# Patient Record
Sex: Female | Born: 1990 | Race: Black or African American | Hispanic: No | Marital: Married | Smoking: Former smoker
Health system: Southern US, Community
[De-identification: ages and names within clinical notes are randomized; demographics above are authoritative.]

## PROBLEM LIST (undated history)

## (undated) ENCOUNTER — Inpatient Hospital Stay (HOSPITAL_COMMUNITY): Payer: Self-pay

## (undated) ENCOUNTER — Ambulatory Visit: Admission: EM | Payer: 59

## (undated) DIAGNOSIS — F419 Anxiety disorder, unspecified: Secondary | ICD-10-CM

## (undated) DIAGNOSIS — D649 Anemia, unspecified: Secondary | ICD-10-CM

## (undated) DIAGNOSIS — M109 Gout, unspecified: Secondary | ICD-10-CM

## (undated) DIAGNOSIS — Q513 Bicornate uterus: Secondary | ICD-10-CM

## (undated) DIAGNOSIS — B009 Herpesviral infection, unspecified: Secondary | ICD-10-CM

## (undated) DIAGNOSIS — I10 Essential (primary) hypertension: Secondary | ICD-10-CM

## (undated) DIAGNOSIS — Z8759 Personal history of other complications of pregnancy, childbirth and the puerperium: Secondary | ICD-10-CM

## (undated) DIAGNOSIS — F32A Depression, unspecified: Secondary | ICD-10-CM

## (undated) DIAGNOSIS — N83209 Unspecified ovarian cyst, unspecified side: Secondary | ICD-10-CM

## (undated) DIAGNOSIS — O039 Complete or unspecified spontaneous abortion without complication: Secondary | ICD-10-CM

## (undated) DIAGNOSIS — E119 Type 2 diabetes mellitus without complications: Secondary | ICD-10-CM

## (undated) DIAGNOSIS — J189 Pneumonia, unspecified organism: Secondary | ICD-10-CM

## (undated) DIAGNOSIS — F329 Major depressive disorder, single episode, unspecified: Secondary | ICD-10-CM

## (undated) HISTORY — DX: Personal history of other complications of pregnancy, childbirth and the puerperium: Z87.59

## (undated) HISTORY — DX: Complete or unspecified spontaneous abortion without complication: O03.9

## (undated) HISTORY — PX: WISDOM TOOTH EXTRACTION: SHX21

## (undated) HISTORY — PX: DILATION AND CURETTAGE OF UTERUS: SHX78

---
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
## 2014-11-05 ENCOUNTER — Emergency Department (HOSPITAL_COMMUNITY)
Admission: EM | Admit: 2014-11-05 | Discharge: 2014-11-06 | Disposition: A | Discharge status: Home / Self Care | Payer: Self-pay | Attending: Emergency Medicine | Admitting: Emergency Medicine

## 2014-11-05 ENCOUNTER — Emergency Department (HOSPITAL_COMMUNITY): Payer: Self-pay

## 2014-11-05 ENCOUNTER — Encounter (HOSPITAL_COMMUNITY): Payer: Self-pay | Admitting: *Deleted

## 2014-11-05 DIAGNOSIS — W19XXXA Unspecified fall, initial encounter: Secondary | ICD-10-CM

## 2014-11-05 DIAGNOSIS — S199XXA Unspecified injury of neck, initial encounter: Secondary | ICD-10-CM | POA: Insufficient documentation

## 2014-11-05 DIAGNOSIS — Z3202 Encounter for pregnancy test, result negative: Secondary | ICD-10-CM | POA: Insufficient documentation

## 2014-11-05 DIAGNOSIS — Y9259 Other trade areas as the place of occurrence of the external cause: Secondary | ICD-10-CM | POA: Insufficient documentation

## 2014-11-05 DIAGNOSIS — Y9389 Activity, other specified: Secondary | ICD-10-CM | POA: Insufficient documentation

## 2014-11-05 DIAGNOSIS — M109 Gout, unspecified: Secondary | ICD-10-CM | POA: Insufficient documentation

## 2014-11-05 DIAGNOSIS — W010XXA Fall on same level from slipping, tripping and stumbling without subsequent striking against object, initial encounter: Secondary | ICD-10-CM | POA: Insufficient documentation

## 2014-11-05 DIAGNOSIS — Y998 Other external cause status: Secondary | ICD-10-CM | POA: Insufficient documentation

## 2014-11-05 DIAGNOSIS — Y9289 Other specified places as the place of occurrence of the external cause: Secondary | ICD-10-CM | POA: Insufficient documentation

## 2014-11-05 DIAGNOSIS — S39012A Strain of muscle, fascia and tendon of lower back, initial encounter: Secondary | ICD-10-CM | POA: Insufficient documentation

## 2014-11-05 DIAGNOSIS — E663 Overweight: Secondary | ICD-10-CM | POA: Insufficient documentation

## 2014-11-05 HISTORY — DX: Gout, unspecified: M10.9

## 2014-11-05 LAB — PREGNANCY, URINE: PREG TEST UR: NEGATIVE

## 2014-11-05 MED ORDER — CYCLOBENZAPRINE HCL 10 MG PO TABS
5.0000 mg | ORAL_TABLET | Freq: Once | ORAL | Status: AC
  Administered 2014-11-05: 5 mg via ORAL
  Filled 2014-11-05: qty 1

## 2014-11-05 MED ORDER — KETOROLAC TROMETHAMINE 60 MG/2ML IM SOLN
60.0000 mg | Freq: Once | INTRAMUSCULAR | Status: AC
  Administered 2014-11-05: 60 mg via INTRAMUSCULAR
  Filled 2014-11-05: qty 2

## 2014-11-05 NOTE — ED Provider Notes (Signed)
CSN: [id number]     Arrival date & time 11/05/14  2242 History  By signing my name below, I, Murriel Hopper, attest that this documentation has been prepared under the direction and in the presence of Shon Baton, MD. Electronically Signed: Murriel Hopper, ED Scribe. 11/05/2014. 11:22 PM.   Chief Complaint  Patient presents with  . Fall     The history is provided by the patient. No language interpreter was used.   HPI Comments: Nina Phillips is a 24 y.o. female who presents to the Emergency Department complaining of a fall that occurred yesterday morning. Pt states that she slipped on a wet floor and landed on her right side while she was at [facility] yesterday. Pt states she currently has 10/10 bilateral middle and lower back pain.. Pt reports she is able to ambulate, but feels "stiff " while doing so. Pt denies hitting her head during the fall or LOC. Pt states she has not taken any medication to treat her symptoms. Pt denies weakness.    Past Medical History  Diagnosis Date  . Gout    History reviewed. No pertinent past surgical history. History reviewed. No pertinent family history. Social History  Substance Use Topics  . Smoking status: Never Smoker   . Smokeless tobacco: None  . Alcohol Use: No   OB History    No data available     Review of Systems  Respiratory: Negative for chest tightness and shortness of breath.   Gastrointestinal: Negative for abdominal pain.  Genitourinary: Negative for difficulty urinating.  Musculoskeletal: Positive for back pain and neck pain.  Neurological: Negative for weakness.  All other systems reviewed and are negative.     Allergies  Review of patient's allergies indicates no known allergies.  Home Medications   Prior to Admission medications   Medication Sig Start Date End Date Taking? Authorizing Provider  cyclobenzaprine (FLEXERIL) 5 MG tablet Take 1 tablet (5 mg total) by mouth 3 (three) times daily as needed for  muscle spasms. 11/06/14   Shon Baton, MD  ibuprofen (ADVIL,MOTRIN) 600 MG tablet Take 1 tablet (600 mg total) by mouth every 6 (six) hours as needed. 11/06/14   Shon Baton, MD   BP 156/91 mmHg  Pulse 97  Temp(Src) 98.8 F (37.1 C) (Oral)  Resp 20  Ht  (1.6 m)  Wt 232 lb (105.235 kg)  BMI 41.11 kg/m2  SpO2 100%  LMP 10/09/2014 Physical Exam  Constitutional: She is oriented to person, place, and time. She appears well-developed and well-nourished.  Overweight  HENT:  Head: Normocephalic and atraumatic.  Cardiovascular: Normal rate, regular rhythm and normal heart sounds.   No murmur heard. Pulmonary/Chest: Effort normal and breath sounds normal. No respiratory distress. She has no wheezes.  Abdominal: Soft. Bowel sounds are normal. There is no tenderness. There is no rebound.  Musculoskeletal:  Tenderness to palpation over the right cervical paraspinous muscle region and rhomboid, no midline cervical tenderness, step-off, deformity, tenderness to palpation over the lower T and L-spine in the midline without step-off or deformity, there is also tenderness to palpation over the right paraspinous muscle region of the lumbar spine with muscle spasm noted and right SI joint  Neurological: She is alert and oriented to person, place, and time.  Skin: Skin is warm and dry.  Psychiatric: She has a normal mood and affect.  Nursing note and vitals reviewed.   ED Course  Procedures (including critical care time)  DIAGNOSTIC STUDIES: Oxygen  Saturation is 100% on room air, normal by my interpretation.    COORDINATION OF CARE: 11:12 PM Discussed treatment plan with pt at bedside and pt agreed to plan.   Labs Review Labs Reviewed  PREGNANCY, URINE    Imaging Review Dg Thoracic Spine W/swimmers  11/06/2014   CLINICAL DATA:  24 year old female slipped and fell on wet floor with acute pain. Initial encounter.  EXAM: THORACIC SPINE - 3 VIEWS  COMPARISON:  Lumbar series  from today reported separately.  FINDINGS: Bone mineralization is within normal limits. Normal thoracic segmentation with 12 pairs of ribs. Full size ribs at T12. This is the same numbering system used on the lumbar series today. Preserved thoracic vertebral height and alignment. Cervicothoracic junction alignment is within normal limits. Posterior ribs appear intact. Visualized thoracic visceral contours are within normal limits.  IMPRESSION: No acute fracture or listhesis identified in the thoracic spine.   Electronically Signed   By: Odessa Fleming M.D.   On: 11/06/2014 00:11   Dg Lumbar Spine Complete  11/06/2014   CLINICAL DATA:  24 year old female slipped and fell on wet floor. Acute pain. Initial encounter.  EXAM: LUMBAR SPINE - COMPLETE 4+ VIEW  COMPARISON:  None.  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 Mild retrolisthesis at L4-L5. Relatively preserved disc spaces. No pars fracture identified. sacral ala and SI joints within normal limits. Negative visual bowel gas pattern. Visualized pelvis appears intact.  IMPRESSION: 1.  No acute fracture identified in the lumbar spine. 2. Transitional lumbosacral anatomy. Mild retrolisthesis at L4-L5. Relatively preserved disc spaces.   Electronically Signed   By: Odessa Fleming M.D.   On: 11/06/2014 00:10   I have personally reviewed and evaluated these images and lab results as part of my medical decision-making.   EKG Interpretation None      MDM   Final diagnoses:  Fall, initial encounter  Lumbosacral strain, initial encounter    Patient presents with a ground-level fall. Occurred approximately 36 hours ago. Reports muscular pain and low back pain. No evidence of cauda equina. She does have midline tenderness to palpation. C-spine cleared by Nexus criteria.  Plain films obtained and patient given Toradol and Flexeril.  Plain films negative. Will discharge home with Flexeril and  ibuprofen for muscle spasm.  After history, exam, and medical workup I feel the patient has been appropriately medically screened and is safe for discharge home. Pertinent diagnoses were discussed with the patient. Patient was given return precautions.  I personally performed the services described in this documentation, which was scribed in my presence. The recorded information has been reviewed and is accurate.    Shon Baton, MD 11/06/14 [phone]

## 2014-11-05 NOTE — ED Notes (Signed)
Pt states she slipped in water yesterday at [facility]. Pt c/o back, neck and knee pain.

## 2014-11-06 MED ORDER — CYCLOBENZAPRINE HCL 5 MG PO TABS: 5.0000 mg | ORAL_TABLET | Freq: Three times a day (TID) | ORAL | Status: DC | PRN

## 2014-11-06 MED ORDER — IBUPROFEN 600 MG PO TABS: 600.0000 mg | ORAL_TABLET | Freq: Four times a day (QID) | ORAL | Status: DC | PRN

## 2014-11-06 NOTE — Discharge Instructions (Signed)

## 2015-03-20 ENCOUNTER — Encounter (HOSPITAL_COMMUNITY): Payer: Self-pay | Admitting: Emergency Medicine

## 2015-03-20 ENCOUNTER — Emergency Department (HOSPITAL_COMMUNITY)
Admission: EM | Admit: 2015-03-20 | Discharge: 2015-03-20 | Disposition: A | Discharge status: Home / Self Care | Payer: Self-pay | Attending: Emergency Medicine | Admitting: Emergency Medicine

## 2015-03-20 DIAGNOSIS — M109 Gout, unspecified: Secondary | ICD-10-CM | POA: Insufficient documentation

## 2015-03-20 DIAGNOSIS — Z3202 Encounter for pregnancy test, result negative: Secondary | ICD-10-CM | POA: Insufficient documentation

## 2015-03-20 DIAGNOSIS — N760 Acute vaginitis: Secondary | ICD-10-CM | POA: Insufficient documentation

## 2015-03-20 DIAGNOSIS — A599 Trichomoniasis, unspecified: Secondary | ICD-10-CM

## 2015-03-20 DIAGNOSIS — B9689 Other specified bacterial agents as the cause of diseases classified elsewhere: Secondary | ICD-10-CM

## 2015-03-20 DIAGNOSIS — A5901 Trichomonal vulvovaginitis: Secondary | ICD-10-CM | POA: Insufficient documentation

## 2015-03-20 LAB — URINE MICROSCOPIC-ADD ON

## 2015-03-20 LAB — URINALYSIS, ROUTINE W REFLEX MICROSCOPIC
Bilirubin Urine: NEGATIVE
Glucose, UA: NEGATIVE mg/dL
HGB URINE DIPSTICK: NEGATIVE
KETONES UR: NEGATIVE mg/dL
Nitrite: NEGATIVE
PROTEIN: 100 mg/dL — AB
Specific Gravity, Urine: 1.025 (ref 1.005–1.030)
pH: 5.5 (ref 5.0–8.0)

## 2015-03-20 LAB — WET PREP, GENITAL
Sperm: NONE SEEN
Yeast Wet Prep HPF POC: NONE SEEN

## 2015-03-20 LAB — POC URINE PREG, ED: PREG TEST UR: NEGATIVE

## 2015-03-20 MED ORDER — AZITHROMYCIN 250 MG PO TABS
1000.0000 mg | ORAL_TABLET | Freq: Once | ORAL | Status: AC
  Administered 2015-03-20: 1000 mg via ORAL
  Filled 2015-03-20: qty 4

## 2015-03-20 MED ORDER — LIDOCAINE HCL (PF) 1 % IJ SOLN
INTRAMUSCULAR | Status: AC
  Administered 2015-03-20: 2 mL
  Filled 2015-03-20: qty 5

## 2015-03-20 MED ORDER — METRONIDAZOLE 500 MG PO TABS
500.0000 mg | ORAL_TABLET | Freq: Two times a day (BID) | ORAL | Status: DC
Start: 2015-03-20 — End: 2016-01-02

## 2015-03-20 MED ORDER — CEFTRIAXONE SODIUM 250 MG IJ SOLR
250.0000 mg | Freq: Once | INTRAMUSCULAR | Status: AC
  Administered 2015-03-20: 250 mg via INTRAMUSCULAR
  Filled 2015-03-20: qty 250

## 2015-03-20 MED ORDER — ONDANSETRON HCL 4 MG PO TABS
4.0000 mg | ORAL_TABLET | Freq: Once | ORAL | Status: AC
  Administered 2015-03-20: 4 mg via ORAL
  Filled 2015-03-20: qty 1

## 2015-03-20 NOTE — ED Notes (Signed)
Pt reports she thinks she may have gotten herpes from ex-boyfriend.  Pt reports that blisters in vaginal area started to appear 2 weeks ago.  Pt denies itching/burning.

## 2015-03-20 NOTE — Discharge Instructions (Signed)
Your foot prep reveals Trichomonas, and bacterial vaginosis. Please use Flagyl 2 times daily with food. Please refrain from all alcohol while taking this medication. Please have your partner evaluated at the health department or his primary care physician. Please refrain from all sexual activity over the next 7 days. Trichomoniasis Trichomoniasis is an infection caused by an organism called Trichomonas. The infection can affect both women and men. In women, the outer female genitalia and the vagina are affected. In men, the penis is mainly affected, but the prostate and other reproductive organs can also be involved. Trichomoniasis is a sexually transmitted infection (STI) and is most often passed to another person through sexual contact.  RISK FACTORS  Having unprotected sexual intercourse.  Having sexual intercourse with an infected partner. SIGNS AND SYMPTOMS  Symptoms of trichomoniasis in women include:  Abnormal gray-green frothy vaginal discharge.  Itching and irritation of the vagina.  Itching and irritation of the area outside the vagina. Symptoms of trichomoniasis in men include:   Penile discharge with or without pain.  Pain during urination. This results from inflammation of the urethra. DIAGNOSIS  Trichomoniasis may be found during a Pap test or physical exam. Your health care provider may use one of the following methods to help diagnose this infection:  Testing the pH of the vagina with a test tape.  Using a vaginal swab test that checks for the Trichomonas organism. A test is available that provides results within a few minutes.  Examining a urine sample.  Testing vaginal secretions. Your health care provider may test you for other STIs, including HIV. TREATMENT   You may be given medicine to fight the infection. Women should inform their health care provider if they could be or are pregnant. Some medicines used to treat the infection should not be taken during  pregnancy.  Your health care provider may recommend over-the-counter medicines or creams to decrease itching or irritation.  Your sexual partner will need to be treated if infected.  Your health care provider may test you for infection again 3 months after treatment. HOME CARE INSTRUCTIONS   Take medicines only as directed by your health care provider.  Take over-the-counter medicine for itching or irritation as directed by your health care provider.  Do not have sexual intercourse while you have the infection.  Women should not douche or wear tampons while they have the infection.  Discuss your infection with your partner. Your partner may have gotten the infection from you, or you may have gotten it from your partner.  Have your sex partner get examined and treated if necessary.  Practice safe, informed, and protected sex.  See your health care provider for other STI testing. SEEK MEDICAL CARE IF:   You still have symptoms after you finish your medicine.  You develop abdominal pain.  You have pain when you urinate.  You have bleeding after sexual intercourse.  You develop a rash.  Your medicine makes you sick or makes you throw up (vomit). MAKE SURE YOU:  Understand these instructions.  Will watch your condition.  Will get help right away if you are not doing well or get worse.   This information is not intended to replace advice given to you by your health care provider. Make sure you discuss any questions you have with your health care provider.   Document Released: 07/07/2000 Document Revised: 02/01/2014 Document Reviewed: 10/23/2012 Elsevier Interactive Patient Education 2016 Elsevier Inc.  Bacterial Vaginosis Bacterial vaginosis is an infection of the  vagina. It happens when too many germs (bacteria) grow in the vagina. Having this infection puts you at risk for getting other infections from sex. Treating this infection can help lower your risk for other  infections, such as:   Chlamydia.  Gonorrhea.  HIV.  Herpes. HOME CARE  Take your medicine as told by your doctor.  Finish your medicine even if you start to feel better.  Tell your sex partner that you have an infection. They should see their doctor for treatment.  During treatment:  Avoid sex or use condoms correctly.  Do not douche.  Do not drink alcohol unless your doctor tells you it is ok.  Do not breastfeed unless your doctor tells you it is ok. GET HELP IF:  You are not getting better after 3 days of treatment.  You have more grey fluid (discharge) coming from your vagina than before.  You have more pain than before.  You have a fever. MAKE SURE YOU:   Understand these instructions.  Will watch your condition.  Will get help right away if you are not doing well or get worse.   This information is not intended to replace advice given to you by your health care provider. Make sure you discuss any questions you have with your health care provider.   Document Released: 10/21/2007 Document Revised: 02/01/2014 Document Reviewed: 08/23/2012 Elsevier Interactive Patient Education [employer]

## 2015-03-20 NOTE — ED Provider Notes (Signed)
CSN: [id number]     Arrival date & time 03/20/15  1050 History   First MD Initiated Contact with Patient 03/20/15 1102     Chief Complaint  Patient presents with  . Exposure to STD     (Consider location/radiation/quality/duration/timing/severity/associated sxs/prior Treatment) Patient is a 25 y.o. female presenting with STD exposure. The history is provided by the patient.  Exposure to STD This is a new problem. The current episode started 1 to 4 weeks ago. The problem has been gradually worsening. Associated symptoms include a rash. Pertinent negatives include no fever. Associated symptoms comments: Vaginal discharge.. Exacerbated by: stinging after urination. She has tried nothing for the symptoms. The treatment provided no relief.    Past Medical History  Diagnosis Date  . Gout    History reviewed. No pertinent past surgical history. History reviewed. No pertinent family history. Social History  Substance Use Topics  . Smoking status: Never Smoker   . Smokeless tobacco: None  . Alcohol Use: No   OB History    No data available     Review of Systems  Constitutional: Negative for fever.  Genitourinary: Positive for vaginal discharge and vaginal pain. Negative for difficulty urinating.  Skin: Positive for rash.  All other systems reviewed and are negative.     Allergies  Review of patient's allergies indicates no known allergies.  Home Medications   Prior to Admission medications   Medication Sig Start Date End Date Taking? Authorizing Provider  cyclobenzaprine (FLEXERIL) 5 MG tablet Take 1 tablet (5 mg total) by mouth 3 (three) times daily as needed for muscle spasms. 11/06/14   Shon Baton, MD  ibuprofen (ADVIL,MOTRIN) 600 MG tablet Take 1 tablet (600 mg total) by mouth every 6 (six) hours as needed. 11/06/14   Shon Baton, MD   BP 145/86 mmHg  Pulse 79  Temp(Src) 99.1 F (37.3 C) (Temporal)  Resp 18  Ht  (1.6 m)  Wt 110.224 kg  BMI 43.06  kg/m2  SpO2 100%  LMP 02/27/2014 Physical Exam  Constitutional: She is oriented to person, place, and time. She appears well-developed and well-nourished.  Non-toxic appearance.  HENT:  Head: Normocephalic.  Right Ear: Tympanic membrane and external ear normal.  Left Ear: Tympanic membrane and external ear normal.  Eyes: EOM and lids are normal. Pupils are equal, round, and reactive to light.  Neck: Normal range of motion. Neck supple. Carotid bruit is not present.  Cardiovascular: Normal rate, regular rhythm, normal heart sounds, intact distal pulses and normal pulses.   Pulmonary/Chest: Breath sounds normal. No respiratory distress.  Abdominal: Soft. Bowel sounds are normal. There is no tenderness. There is no guarding.  Genitourinary:  Chaperone present during the exam. Several denuded small lesions of the internal and external labia. No drainage. Mild to mod vag discharge present. Ox is friable. No fb noted. Mild left adnexal tenderness, no mass.  Musculoskeletal: Normal range of motion.  Lymphadenopathy:       Head (right side): No submandibular adenopathy present.       Head (left side): No submandibular adenopathy present.    She has no cervical adenopathy.  Neurological: She is alert and oriented to person, place, and time. She has normal strength. No cranial nerve deficit or sensory deficit.  Skin: Skin is warm and dry.  Psychiatric: She has a normal mood and affect. Her speech is normal.  Nursing note and vitals reviewed.   ED Course  Procedures (including critical care time) Labs Review  Labs Reviewed  WET PREP, GENITAL  URINALYSIS, ROUTINE W REFLEX MICROSCOPIC (NOT AT [facility])  POC URINE PREG, ED  GC/CHLAMYDIA PROBE AMP ([place]) NOT AT [facility]    Imaging Review No results found. I have personally reviewed and evaluated these images and lab results as part of my medical decision-making.   EKG Interpretation None      MDM  Preg test neg. Wet prep reveals  trich. UA neg for infection. Pt treated with Rocephin and Zithromax. Rx for Flagyl given to the patient. HSV, RPR and HIV pending. Dislcussed safe sex with patient. Questions answered.   Final diagnoses:  Trichomonas vaginalis infection  Bacterial vaginitis    **I have reviewed nursing notes, vital signs, and all appropriate lab and imaging results for this patient.Ivery Quale, PA-C 03/22/15 2052  Eber Hong, MD 03/24/15 [phone]

## 2015-03-21 LAB — HSV 2 ANTIBODY, IGG

## 2015-03-21 LAB — HIV ANTIBODY (ROUTINE TESTING W REFLEX): HIV SCREEN 4TH GENERATION: NONREACTIVE

## 2015-03-21 LAB — GC/CHLAMYDIA PROBE AMP (~~LOC~~) NOT AT ARMC
Chlamydia: NEGATIVE
Neisseria Gonorrhea: NEGATIVE

## 2015-03-21 LAB — RPR: RPR: NONREACTIVE

## 2015-07-26 ENCOUNTER — Encounter (HOSPITAL_COMMUNITY): Payer: Self-pay | Admitting: Emergency Medicine

## 2015-07-26 ENCOUNTER — Emergency Department (HOSPITAL_COMMUNITY)
Admission: EM | Admit: 2015-07-26 | Discharge: 2015-07-26 | Disposition: A | Discharge status: Home / Self Care | Payer: Self-pay | Attending: Emergency Medicine | Admitting: Emergency Medicine

## 2015-07-26 DIAGNOSIS — L0291 Cutaneous abscess, unspecified: Secondary | ICD-10-CM

## 2015-07-26 DIAGNOSIS — L0201 Cutaneous abscess of face: Secondary | ICD-10-CM | POA: Insufficient documentation

## 2015-07-26 MED ORDER — HYDROCODONE-ACETAMINOPHEN 5-325 MG PO TABS: 1.0000 | ORAL_TABLET | ORAL | Status: DC | PRN

## 2015-07-26 MED ORDER — AMOXICILLIN-POT CLAVULANATE 875-125 MG PO TABS: 1.0000 | ORAL_TABLET | Freq: Two times a day (BID) | ORAL | Status: DC

## 2015-07-26 MED ORDER — SULFAMETHOXAZOLE-TRIMETHOPRIM 800-160 MG PO TABS: 1.0000 | ORAL_TABLET | Freq: Two times a day (BID) | ORAL | Status: AC

## 2015-07-26 MED ORDER — MELOXICAM 15 MG PO TABS: 15.0000 mg | ORAL_TABLET | Freq: Every day | ORAL | Status: DC

## 2015-07-26 MED ORDER — KETOROLAC TROMETHAMINE 10 MG PO TABS
10.0000 mg | ORAL_TABLET | Freq: Once | ORAL | Status: AC
  Administered 2015-07-26: 10 mg via ORAL
  Filled 2015-07-26: qty 1

## 2015-07-26 MED ORDER — DOXYCYCLINE HYCLATE 100 MG PO TABS
100.0000 mg | ORAL_TABLET | Freq: Once | ORAL | Status: AC
  Administered 2015-07-26: 100 mg via ORAL
  Filled 2015-07-26: qty 1

## 2015-07-26 NOTE — Discharge Instructions (Signed)
Please apply warm compresses to your right face 3 times daily on. Please use Bactrim, meloxicam, and Augmentin daily with food. May use Norco for more severe pain. This medication may cause drowsiness, please use with caution. Please see your primary physician, or return to the emergency department if any signs of advancing infection. Abscess An abscess (boil or furuncle) is an infected area on or under the skin. This area is filled with yellowish-white fluid (pus) and other material (debris). HOME CARE   Only take medicines as told by your doctor.  If you were given antibiotic medicine, take it as directed. Finish the medicine even if you start to feel better.  If gauze is used, follow your doctor's directions for changing the gauze.  To avoid spreading the infection:  Keep your abscess covered with a bandage.  Wash your hands well.  Do not share personal care items, towels, or whirlpools with others.  Avoid skin contact with others.  Keep your skin and clothes clean around the abscess.  Keep all doctor visits as told. GET HELP RIGHT AWAY IF:   You have more pain, puffiness (swelling), or redness in the wound site.  You have more fluid or blood coming from the wound site.  You have muscle aches, chills, or you feel sick.  You have a fever. MAKE SURE YOU:   Understand these instructions.  Will watch your condition.  Will get help right away if you are not doing well or get worse.   This information is not intended to replace advice given to you by your health care provider. Make sure you discuss any questions you have with your health care provider.   Document Released: 06/30/2007 Document Revised: 07/13/2011 Document Reviewed: 03/27/2011 Elsevier Interactive Patient Education [employer] Elsevier Inc.

## 2015-07-26 NOTE — ED Provider Notes (Signed)
CSN: [id number]     Arrival date & time 07/26/15  1531 History   First MD Initiated Contact with Patient 07/26/15 1554     Chief Complaint  Patient presents with  . Abscess     (Consider location/radiation/quality/duration/timing/severity/associated sxs/prior Treatment) Patient is a 25 y.o. female presenting with abscess. The history is provided by the patient.  Abscess Location:  Face Facial abscess location:  Face Abscess quality: painful, redness and warmth   Abscess quality: not draining   Duration:  3 days Progression:  Worsening Pain details:    Quality:  Aching and throbbing   Severity:  Moderate   Duration:  3 days   Timing:  Intermittent   Progression:  Worsening Chronicity:  New Context: insect bite/sting   Context: not diabetes and not immunosuppression   Relieved by:  Nothing Worsened by:  Draining/squeezing Ineffective treatments:  None tried Associated symptoms: no fever, no nausea and no vomiting   Risk factors: no hx of MRSA and no prior abscess     Past Medical History  Diagnosis Date  . Gout    History reviewed. No pertinent past surgical history. Family History  Problem Relation Age of Onset  . Cancer Other   . Hypertension Other   . Diabetes Other    Social History  Substance Use Topics  . Smoking status: Never Smoker   . Smokeless tobacco: Never Used  . Alcohol Use: No   OB History    No data available     Review of Systems  Constitutional: Negative for fever.  Gastrointestinal: Negative for nausea and vomiting.  Skin:       abscess  All other systems reviewed and are negative.     Allergies  Review of patient's allergies indicates no known allergies.  Home Medications   Prior to Admission medications   Medication Sig Start Date End Date Taking? Authorizing Provider  metroNIDAZOLE (FLAGYL) 500 MG tablet Take 1 tablet (500 mg total) by mouth 2 (two) times daily. Patient not taking: Reported on 07/26/2015 03/20/15   Ivery QualeHobson  Karyna Bessler, PA-C   BP 140/78 mmHg  Pulse 68  Temp(Src) 98.5 F (36.9 C) (Oral)  Resp 18  Ht 5\' 3"  (1.6 m)  Wt 105.461 kg  BMI 41.20 kg/m2  SpO2 100%  LMP 07/13/2015 Physical Exam  Constitutional: She is oriented to person, place, and time. She appears well-developed and well-nourished.  Non-toxic appearance.  HENT:  Head: Normocephalic.    Right Ear: Tympanic membrane and external ear normal.  Left Ear: Tympanic membrane and external ear normal.  Eyes: EOM and lids are normal. Pupils are equal, round, and reactive to light.  Neck: Normal range of motion. Neck supple. Carotid bruit is not present.  Cardiovascular: Normal rate, regular rhythm, normal heart sounds, intact distal pulses and normal pulses.   Pulmonary/Chest: Breath sounds normal. No respiratory distress.  Abdominal: Soft. Bowel sounds are normal. There is no tenderness. There is no guarding.  Musculoskeletal: Normal range of motion.  Lymphadenopathy:       Head (right side): No submandibular adenopathy present.       Head (left side): No submandibular adenopathy present.    She has no cervical adenopathy.  Neurological: She is alert and oriented to person, place, and time. She has normal strength. No cranial nerve deficit or sensory deficit.  Skin: Skin is warm and dry.  Psychiatric: She has a normal mood and affect. Her speech is normal.  Nursing note and vitals reviewed.   ED  Course  Procedures (including critical care time) Labs Review Labs Reviewed - No data to display  Imaging Review No results found. I have personally reviewed and evaluated these images and lab results as part of my medical decision-making.   EKG Interpretation None      MDM Patient has an abscess formation involving the right lower face on. There is no red streaking appreciated. There is no orbital involvement on. Vital signs within normal limits. The patient will be treated with Bactrim, Augmentin, Modic, and Norco. Patient is  advised to use warm compresses. The patient will see general surgery, or return to the emergency department if not improving.    Final diagnoses:  Abscess    **I have reviewed nursing notes, vital signs, and all appropriate lab and imaging results for this patient.Ivery Quale*    Zareen Jamison, PA-C 07/27/15 1334  Bethann BerkshireJoseph Zammit, MD 07/28/15 [phone]

## 2015-07-26 NOTE — ED Notes (Signed)
Pt with absces to the R lower face. Pt states she thinks she was bitten by a spider.

## 2016-01-01 ENCOUNTER — Emergency Department (HOSPITAL_COMMUNITY)
Admission: EM | Admit: 2016-01-01 | Discharge: 2016-01-02 | Disposition: A | Discharge status: Home / Self Care | Payer: Self-pay | Attending: Emergency Medicine | Admitting: Emergency Medicine

## 2016-01-01 ENCOUNTER — Encounter (HOSPITAL_COMMUNITY): Payer: Self-pay | Admitting: Emergency Medicine

## 2016-01-01 DIAGNOSIS — N760 Acute vaginitis: Secondary | ICD-10-CM | POA: Insufficient documentation

## 2016-01-01 LAB — POC URINE PREG, ED: Preg Test, Ur: NEGATIVE

## 2016-01-01 NOTE — ED Notes (Signed)
Pt states vaginal rash & discharge for the past week. Pt given gown to change into & sheet to cover up with.

## 2016-01-01 NOTE — ED Triage Notes (Signed)
Pt reports rash to her vaginal area that started approx 1 week ago. Pt states she used scented toilet paper and has had the rash since.

## 2016-01-02 LAB — WET PREP, GENITAL
Sperm: NONE SEEN
Trich, Wet Prep: NONE SEEN
Yeast Wet Prep HPF POC: NONE SEEN

## 2016-01-02 LAB — URINALYSIS, ROUTINE W REFLEX MICROSCOPIC
BILIRUBIN URINE: NEGATIVE
KETONES UR: NEGATIVE mg/dL
NITRITE: NEGATIVE
PH: 6 (ref 5.0–8.0)
PROTEIN: NEGATIVE mg/dL
Specific Gravity, Urine: 1.015 (ref 1.005–1.030)
WBC, UA: NONE SEEN WBC/hpf (ref 0–5)

## 2016-01-02 MED ORDER — METRONIDAZOLE 500 MG PO TABS: 500.0000 mg | ORAL_TABLET | Freq: Two times a day (BID) | ORAL | 0 refills | Status: DC

## 2016-01-02 MED ORDER — LIDOCAINE HCL (PF) 1 % IJ SOLN
INTRAMUSCULAR | Status: AC
  Administered 2016-01-02: 0.9 mL
  Filled 2016-01-02: qty 5

## 2016-01-02 MED ORDER — CEFTRIAXONE SODIUM 250 MG IJ SOLR
250.0000 mg | Freq: Once | INTRAMUSCULAR | Status: AC
  Administered 2016-01-02: 250 mg via INTRAMUSCULAR
  Filled 2016-01-02: qty 250

## 2016-01-02 MED ORDER — AZITHROMYCIN 250 MG PO TABS
1000.0000 mg | ORAL_TABLET | Freq: Once | ORAL | Status: AC
  Administered 2016-01-02: 1000 mg via ORAL
  Filled 2016-01-02: qty 4

## 2016-01-02 NOTE — ED Provider Notes (Signed)
[facility] Provider Note   CSN: [id number] Arrival date & time: 01/01/16  2256     History   Chief Complaint Chief Complaint  Patient presents with  . Rash    HPI Nina Phillips is a 25 y.o. female.  HPI   Nina Phillips is a 25 y.o. female who presents to the Emergency Department complaining of vaginal irritation, and "blisters" for one week.  She states that she has been using a scented toilet paper and believes she is allergic.  She describes having whitish blisters along her labia that are associated with itching and pain.  Denies burning or pain with urination, abnml vaginal bleeding, pelvic pain or abdominal pain or new or unprotected sexual partners.     Past Medical History:  Diagnosis Date  . Gout     There are no active problems to display for this patient.   History reviewed. No pertinent surgical history.  OB History    Gravida Para Term Preterm AB Living             0   SAB TAB Ectopic Multiple Live Births                   Home Medications    Prior to Admission medications   Medication Sig Start Date End Date Taking? Authorizing Provider  amoxicillin-clavulanate (AUGMENTIN) 875-125 MG tablet Take 1 tablet by mouth every 12 (twelve) hours. 07/26/15   Ivery QualeHobson Bryant, PA-C  HYDROcodone-acetaminophen (NORCO/VICODIN) 5-325 MG tablet Take 1 tablet by mouth every 4 (four) hours as needed. 07/26/15   Ivery QualeHobson Bryant, PA-C  meloxicam (MOBIC) 15 MG tablet Take 1 tablet (15 mg total) by mouth daily. 07/26/15   Ivery QualeHobson Bryant, PA-C  metroNIDAZOLE (FLAGYL) 500 MG tablet Take 1 tablet (500 mg total) by mouth 2 (two) times daily. Patient not taking: Reported on 07/26/2015 03/20/15   Ivery QualeHobson Bryant, PA-C    Family History Family History  Problem Relation Age of Onset  . Cancer Other   . Hypertension Other   . Diabetes Other     Social History Social History  Substance Use Topics  . Smoking status: Never Smoker  . Smokeless tobacco: Never Used  . Alcohol  use Yes     Comment: occas     Allergies   Patient has no known allergies.   Review of Systems Review of Systems  Constitutional: Negative for activity change, appetite change and fever.  Cardiovascular: Negative for chest pain.  Gastrointestinal: Negative for abdominal pain, nausea and vomiting.  Genitourinary: Positive for genital sores. Negative for difficulty urinating, dysuria, flank pain, frequency, pelvic pain, vaginal bleeding, vaginal discharge and vaginal pain.       Vaginal rash  Musculoskeletal: Negative for back pain.  Skin: Negative for color change.  Neurological: Negative for headaches.     Physical Exam Updated Vital Signs BP 134/66 (BP Location: Left Arm)   Pulse 93   Temp 98.4 F (36.9 C) (Oral)   Resp 16   Ht 5' 3.6" (1.615 m)   Wt 105.2 kg   LMP 12/09/2015   SpO2 100%   BMI 40.33 kg/m   Physical Exam  Constitutional: She appears well-developed and well-nourished. No distress.  HENT:  Head: Normocephalic.  Mouth/Throat: Oropharynx is clear and moist.  No oral lesions  Neck: Normal range of motion.  Cardiovascular: Normal rate and regular rhythm.   Pulmonary/Chest: Effort normal. No respiratory distress.  Abdominal: Soft. She exhibits no distension and no mass. There is no  tenderness. There is no guarding.  Genitourinary: Uterus normal. Cervix exhibits no motion tenderness. Right adnexum displays no mass and no tenderness. Left adnexum displays no mass and no tenderness. There is tenderness in the vagina. No bleeding in the vagina. No foreign body in the vagina.  Genitourinary Comments: Exam chaperoned by nursing.  Several macular whitish ulcerations to the labia minora. White vaginal discharge also present. Diffuse tenderness during bimanual exam.  No CMT, no adnexal masses   Musculoskeletal: Normal range of motion.  Lymphadenopathy:    She has no cervical adenopathy.  Neurological: She is alert.  Skin: Skin is warm. No rash noted. No erythema.    Nursing note and vitals reviewed.    ED Treatments / Results  Labs (all labs ordered are listed, but only abnormal results are displayed) Labs Reviewed  WET PREP, GENITAL - Abnormal; Notable for the following:       Result Value   Clue Cells Wet Prep HPF POC PRESENT (*)    WBC, Wet Prep HPF POC FEW (*)    All other components within normal limits  URINALYSIS, ROUTINE W REFLEX MICROSCOPIC - Abnormal; Notable for the following:    Color, Urine STRAW (*)    APPearance HAZY (*)    Glucose, UA >=500 (*)    Hgb urine dipstick SMALL (*)    Leukocytes, UA MODERATE (*)    Bacteria, UA RARE (*)    All other components within normal limits  HSV CULTURE AND TYPING  RPR  HIV ANTIBODY (ROUTINE TESTING)  POC URINE PREG, ED  GC/CHLAMYDIA PROBE AMP [facility]    EKG  EKG Interpretation None       Radiology No results found.  Procedures Procedures (including critical care time)  Medications Ordered in ED Medications  cefTRIAXone (ROCEPHIN) injection 250 mg (250 mg Intramuscular Given 01/02/16 0123)  azithromycin (ZITHROMAX) tablet 1,000 mg (1,000 mg Oral Given 01/02/16 0122)  lidocaine (PF) (XYLOCAINE) 1 % injection (0.9 mLs  Given 01/02/16 0123)     Initial Impression / Assessment and Plan / ED Course  I have reviewed the triage vital signs and the nursing notes.  Pertinent labs & imaging results that were available during my care of the patient were reviewed by me and considered in my medical decision making (see chart for details).  Clinical Course     Pt with vaginal d/c and lesions to labia.  Does not appear c/w herpes although I have sent for culture.  abd is soft, NT.  Doubt PID or TOA.  Pt well appearing.    Agrees to IM rocephin and po zithromax here, rx for flagyl.  Will tx for BV.  Pt understands that she will be notified for any positive cultures.     Final Clinical Impressions(s) / ED Diagnoses   Final diagnoses:  Vaginitis and vulvovaginitis     New Prescriptions New Prescriptions   No medications on file     Rosey Bathammy Ceferino Lang, PA-C 01/02/16 0125    Zadie Rhineonald Wickline, MD 01/02/16 0127

## 2016-01-02 NOTE — Discharge Instructions (Signed)
Sitz baths as needed.  You may apply OTC Vagisil as directed but only apply externally.  You will be notified of any positive cultures.  Return for any worsening symptoms

## 2016-01-03 LAB — RPR: RPR Ser Ql: NONREACTIVE

## 2016-01-03 LAB — HIV ANTIBODY (ROUTINE TESTING W REFLEX): HIV SCREEN 4TH GENERATION: NONREACTIVE

## 2016-01-04 LAB — HSV CULTURE AND TYPING

## 2016-01-05 LAB — GC/CHLAMYDIA PROBE AMP (~~LOC~~) NOT AT ARMC
Chlamydia: NEGATIVE
Neisseria Gonorrhea: NEGATIVE

## 2016-06-26 ENCOUNTER — Emergency Department (HOSPITAL_COMMUNITY)
Admission: EM | Admit: 2016-06-26 | Discharge: 2016-06-26 | Disposition: A | Discharge status: Home Health | Payer: Self-pay | Attending: Emergency Medicine | Admitting: Emergency Medicine

## 2016-06-26 ENCOUNTER — Encounter (HOSPITAL_COMMUNITY): Payer: Self-pay

## 2016-06-26 DIAGNOSIS — L02212 Cutaneous abscess of back [any part, except buttock]: Secondary | ICD-10-CM | POA: Insufficient documentation

## 2016-06-26 DIAGNOSIS — L0291 Cutaneous abscess, unspecified: Secondary | ICD-10-CM

## 2016-06-26 MED ORDER — SULFAMETHOXAZOLE-TRIMETHOPRIM 800-160 MG PO TABS: 1.0000 | ORAL_TABLET | Freq: Two times a day (BID) | ORAL | 0 refills | Status: AC

## 2016-06-26 NOTE — ED Provider Notes (Signed)
[facility] Provider Note   CSN: [id number] Arrival date & time: 06/26/16  1458     History   Chief Complaint Chief Complaint  Patient presents with  . Abscess    HPI Nina Phillips is a 26 y.o. female.  The history is provided by the patient. No language interpreter was used.  Abscess  Location:  Torso Torso abscess location:  Upper back Size:  1 cm Abscess quality: fluctuance, redness and warmth   Red streaking: no   Duration:  3 days Progression:  Worsening Chronicity:  New Context: not diabetes   Relieved by:  Nothing Worsened by:  Nothing Ineffective treatments:  None tried Associated symptoms: no fever     Past Medical History:  Diagnosis Date  . Gout     There are no active problems to display for this patient.   No past surgical history on file.  OB History    Gravida Para Term Preterm AB Living             0   SAB TAB Ectopic Multiple Live Births                   Home Medications    Prior to Admission medications   Medication Sig Start Date End Date Taking? Authorizing Provider  amoxicillin-clavulanate (AUGMENTIN) 875-125 MG tablet Take 1 tablet by mouth every 12 (twelve) hours. 07/26/15   Ivery Quale, PA-C  HYDROcodone-acetaminophen (NORCO/VICODIN) 5-325 MG tablet Take 1 tablet by mouth every 4 (four) hours as needed. 07/26/15   Ivery Quale, PA-C  meloxicam (MOBIC) 15 MG tablet Take 1 tablet (15 mg total) by mouth daily. 07/26/15   Ivery Quale, PA-C  metroNIDAZOLE (FLAGYL) 500 MG tablet Take 1 tablet (500 mg total) by mouth 2 (two) times daily. For 7 days 01/02/16   Triplett, Tammy, PA-C  sulfamethoxazole-trimethoprim (BACTRIM DS,SEPTRA DS) 800-160 MG tablet Take 1 tablet by mouth 2 (two) times daily. 06/26/16 07/03/16  Elson Areas, PA-C    Family History Family History  Problem Relation Age of Onset  . Cancer Other   . Hypertension Other   . Diabetes Other     Social History Social History  Substance Use Topics  .  Smoking status: Never Smoker  . Smokeless tobacco: Never Used  . Alcohol use Yes     Comment: occas     Allergies   Patient has no known allergies.   Review of Systems Review of Systems  Constitutional: Negative for fever.  All other systems reviewed and are negative.    Physical Exam Updated Vital Signs BP (!) 141/98 (BP Location: Left Arm)   Pulse 98   Temp 98.2 F (36.8 C) (Oral)   Resp 18   Ht 5\' 3"  (1.6 m)   Wt 104.3 kg (230 lb)   LMP 06/21/2016   SpO2 100%   BMI 40.74 kg/m   Physical Exam  Constitutional: She is oriented to person, place, and time. She appears well-developed and well-nourished.  HENT:  Head: Normocephalic.  Eyes: EOM are normal.  Neck: Normal range of motion.  Pulmonary/Chest: Effort normal.  Abdominal: She exhibits no distension.  Musculoskeletal: Normal range of motion.  Neurological: She is alert and oriented to person, place, and time.  Skin:  1.0 cm pustule with redness around area. Mid back.  Psychiatric: She has a normal mood and affect.  Nursing note and vitals reviewed.    ED Treatments / Results  Labs (all labs ordered are listed, but only  abnormal results are displayed) Labs Reviewed - No data to display  EKG  EKG Interpretation None       Radiology No results found.  Procedures .Marland Kitchen.Incision and Drainage Date/Time: 06/26/2016 4:24 PM Performed by: Elson AreasSOFIA, Aayushi Solorzano K Authorized by: Elson AreasSOFIA, Katharin Schneider K   Consent:    Consent obtained:  Verbal   Consent given by:  Patient   Risks discussed:  Bleeding   Alternatives discussed:  No treatment Location:    Type:  Abscess   Size:  1   Location:  Trunk Pre-procedure details:    Skin preparation:  Antiseptic wash Anesthesia (see MAR for exact dosages):    Anesthesia method:  None Procedure type:    Complexity:  Simple Procedure details:    Needle aspiration: yes     Needle size:  18 G   Drainage:  Purulent   Drainage amount:  Moderate   Wound treatment:  Wound left  open   Packing materials:  None Post-procedure details:    Patient tolerance of procedure:  Tolerated well, no immediate complications   (including critical care time)  Medications Ordered in ED Medications - No data to display   Initial Impression / Assessment and Plan / ED Course  I have reviewed the triage vital signs and the nursing notes.  Pertinent labs & imaging results that were available during my care of the patient were reviewed by me and considered in my medical decision making (see chart for details).       Final Clinical Impressions(s) / ED Diagnoses   Final diagnoses:  Abscess    New Prescriptions New Prescriptions   SULFAMETHOXAZOLE-TRIMETHOPRIM (BACTRIM DS,SEPTRA DS) 800-160 MG TABLET    Take 1 tablet by mouth 2 (two) times daily.  An After Visit Summary was printed and given to the patient.    Elson AreasSofia, Maleko Greulich K, PA-C 06/26/16 1625    Eber HongMiller, Brian, MD 06/27/16 [phone]

## 2016-06-26 NOTE — ED Triage Notes (Signed)
2-3 day history of "boil" on her back  Has no PCP

## 2016-06-26 NOTE — ED Triage Notes (Signed)
Pt is complaining of boil on back. Can't twist due to pain. Boil has been present for 3 days. Pain is an 8/10.

## 2016-08-10 ENCOUNTER — Encounter (HOSPITAL_COMMUNITY): Payer: Self-pay | Admitting: *Deleted

## 2016-08-10 ENCOUNTER — Emergency Department (HOSPITAL_COMMUNITY)
Admission: EM | Admit: 2016-08-10 | Discharge: 2016-08-10 | Disposition: A | Discharge status: Home / Self Care | Payer: Self-pay | Attending: Emergency Medicine | Admitting: Emergency Medicine

## 2016-08-10 DIAGNOSIS — L739 Follicular disorder, unspecified: Secondary | ICD-10-CM | POA: Insufficient documentation

## 2016-08-10 DIAGNOSIS — Z791 Long term (current) use of non-steroidal anti-inflammatories (NSAID): Secondary | ICD-10-CM | POA: Insufficient documentation

## 2016-08-10 DIAGNOSIS — R51 Headache: Secondary | ICD-10-CM | POA: Insufficient documentation

## 2016-08-10 DIAGNOSIS — R519 Headache, unspecified: Secondary | ICD-10-CM

## 2016-08-10 MED ORDER — MUPIROCIN CALCIUM 2 % NA OINT: TOPICAL_OINTMENT | NASAL | 0 refills | Status: DC

## 2016-08-10 MED ORDER — HYDROCODONE-ACETAMINOPHEN 5-325 MG PO TABS: ORAL_TABLET | ORAL | 0 refills | Status: DC

## 2016-08-10 MED ORDER — HYDROCODONE-ACETAMINOPHEN 5-325 MG PO TABS
1.0000 | ORAL_TABLET | Freq: Once | ORAL | Status: AC
  Administered 2016-08-10: 1 via ORAL
  Filled 2016-08-10: qty 1

## 2016-08-10 MED ORDER — DOXYCYCLINE HYCLATE 100 MG PO CAPS: 100.0000 mg | ORAL_CAPSULE | Freq: Two times a day (BID) | ORAL | 0 refills | Status: DC

## 2016-08-10 MED ORDER — ONDANSETRON 8 MG PO TBDP
8.0000 mg | ORAL_TABLET | Freq: Once | ORAL | Status: AC
  Administered 2016-08-10: 8 mg via ORAL
  Filled 2016-08-10: qty 1

## 2016-08-10 NOTE — Discharge Instructions (Signed)
Warm wet compresses on/off to the affected areas.  Return here for any worsening symptoms

## 2016-08-10 NOTE — ED Triage Notes (Signed)
Pt c/o headache for the last couple of days; pt states she has 3 spider bites

## 2016-08-14 NOTE — ED Provider Notes (Signed)
[facility] Provider Note   CSN: [id number] Arrival date & time: 08/10/16  1907     History   Chief Complaint Chief Complaint  Patient presents with  . Headache    HPI Nina Phillips is a 26 y.o. female.  HPI   Nina Phillips is a 26 y.o. female who presents to the Emergency Department complaining of frontal headache for two days.  Describes headache as throbbing across her forehead and to the top of her head.  She also complains of three raised "bumps" to her lower extremities that she describes as painful.  She believes that she may have been bitten by a spider. Has noticed spiders in her house.  She denies fever, chills, tick bites, visual changes, neck pain or stiffness.  She has tried OTC pain relievers without relief.   Past Medical History:  Diagnosis Date  . Gout     There are no active problems to display for this patient.   History reviewed. No pertinent surgical history.  OB History    Gravida Para Term Preterm AB Living             0   SAB TAB Ectopic Multiple Live Births                   Home Medications    Prior to Admission medications   Medication Sig Start Date End Date Taking? Authorizing Provider  doxycycline (VIBRAMYCIN) 100 MG capsule Take 1 capsule (100 mg total) by mouth 2 (two) times daily. Take with food 08/10/16   Milayah Krell, PA-C  HYDROcodone-acetaminophen (NORCO/VICODIN) 5-325 MG tablet Take one tab po q 4 hrs prn pain 08/10/16   Mykal Batiz, PA-C  meloxicam (MOBIC) 15 MG tablet Take 1 tablet (15 mg total) by mouth daily. 07/26/15   Ivery QualeBryant, Hobson, PA-C  mupirocin nasal ointment (BACTROBAN) 2 % Apply to the affected areas TID x 10 days 08/10/16   Pauline Ausriplett, Kathrynne Kulinski, PA-C    Family History Family History  Problem Relation Age of Onset  . Cancer Other   . Hypertension Other   . Diabetes Other     Social History Social History  Substance Use Topics  . Smoking status: Never Smoker  . Smokeless tobacco: Never Used  .  Alcohol use Yes     Comment: occas     Allergies   Bactrim [sulfamethoxazole-trimethoprim]   Review of Systems Review of Systems  Constitutional: Negative for activity change, appetite change and fever.  HENT: Negative for facial swelling and trouble swallowing.   Eyes: Positive for photophobia. Negative for pain and visual disturbance.  Respiratory: Negative for shortness of breath.   Cardiovascular: Negative for chest pain.  Gastrointestinal: Negative for nausea and vomiting.  Genitourinary: Negative for dysuria and flank pain.  Musculoskeletal: Negative for arthralgias, myalgias, neck pain and neck stiffness.  Skin: Positive for rash. Negative for wound.  Neurological: Positive for headaches. Negative for dizziness, facial asymmetry, speech difficulty, weakness and numbness.  Psychiatric/Behavioral: Negative for confusion and decreased concentration.  All other systems reviewed and are negative.    Physical Exam Updated Vital Signs BP 126/79 (BP Location: Right Arm)   Pulse 76   Temp 98.9 F (37.2 C) (Oral)   Resp 18   Ht 5\' 3"  (1.6 m)   Wt 100.2 kg (221 lb)   SpO2 100%   BMI 39.15 kg/m   Physical Exam  Constitutional: She is oriented to person, place, and time. She appears well-developed and well-nourished. No distress.  HENT:  Head: Normocephalic and atraumatic.  Mouth/Throat: Oropharynx is clear and moist.  Eyes: Pupils are equal, round, and reactive to light. EOM are normal.  Neck: Normal range of motion, full passive range of motion without pain and phonation normal. Neck supple. No spinous process tenderness and no muscular tenderness present. No neck rigidity. No Kernig's sign noted.  Cardiovascular: Normal rate, regular rhythm, normal heart sounds and intact distal pulses.   No murmur heard. Pulmonary/Chest: Effort normal and breath sounds normal. No respiratory distress.  Musculoskeletal: Normal range of motion.  Neurological: She is alert and oriented to  person, place, and time. She has normal strength. No cranial nerve deficit or sensory deficit. She exhibits normal muscle tone. Coordination and gait normal. GCS eye subscore is 4. GCS verbal subscore is 5. GCS motor subscore is 6.  Reflex Scores:      Tricep reflexes are 2+ on the right side and 2+ on the left side.      Bicep reflexes are 2+ on the right side and 2+ on the left side. Skin: Skin is warm. Capillary refill takes less than 2 seconds.  Three small pustules to the bilateral lower extremities.  No induration or edema.  No vesicles.    Psychiatric: She has a normal mood and affect.  Nursing note and vitals reviewed.    ED Treatments / Results  Labs (all labs ordered are listed, but only abnormal results are displayed) Labs Reviewed - No data to display  EKG  EKG Interpretation None       Radiology No results found.  Procedures Procedures (including critical care time)  Medications Ordered in ED Medications  HYDROcodone-acetaminophen (NORCO/VICODIN) 5-325 MG per tablet 1 tablet (1 tablet Oral Given 08/10/16 2054)  ondansetron (ZOFRAN-ODT) disintegrating tablet 8 mg (8 mg Oral Given 08/10/16 2054)     Initial Impression / Assessment and Plan / ED Course  I have reviewed the triage vital signs and the nursing notes.  Pertinent labs & imaging results that were available during my care of the patient were reviewed by me and considered in my medical decision making (see chart for details).     Pt well appearing.  Vitals stable .  No focal neuro deficits.  No meningeal signs.  Small pustules appear c/w folliculitis.    Pt feeling better after medications and states she is ready for d/c.  Agrees to care plan  Final Clinical Impressions(s) / ED Diagnoses   Final diagnoses:  Bad headache  Folliculitis    New Prescriptions Discharge Medication List as of 08/10/2016  9:47 PM    START taking these medications   Details  doxycycline (VIBRAMYCIN) 100 MG capsule  Take 1 capsule (100 mg total) by mouth 2 (two) times daily. Take with food, Starting Tue 08/10/2016, Print    mupirocin nasal ointment (BACTROBAN) 2 % Apply to the affected areas TID x 10 days, Print         Pauline Aus, PA-C 08/14/16 0147    Mancel Bale, MD 08/14/16 [phone]

## 2016-11-24 ENCOUNTER — Ambulatory Visit (HOSPITAL_COMMUNITY)
Admission: EM | Admit: 2016-11-24 | Discharge: 2016-11-24 | Disposition: A | Discharge status: Home / Self Care | Payer: Self-pay | Attending: Family Medicine | Admitting: Family Medicine

## 2016-11-24 ENCOUNTER — Encounter (HOSPITAL_COMMUNITY): Payer: Self-pay | Admitting: Emergency Medicine

## 2016-11-24 DIAGNOSIS — H5711 Ocular pain, right eye: Secondary | ICD-10-CM

## 2016-11-24 MED ORDER — TETRACAINE HCL 0.5 % OP SOLN
OPHTHALMIC | Status: AC
  Filled 2016-11-24: qty 4

## 2016-11-24 MED ORDER — FLUORESCEIN SODIUM 0.6 MG OP STRP
ORAL_STRIP | OPHTHALMIC | Status: AC
  Filled 2016-11-24: qty 1

## 2016-11-24 NOTE — ED Provider Notes (Signed)
[facility]    CSN: [id number] Arrival date & time: 11/24/16  1513     History   Chief Complaint Chief Complaint  Patient presents with  . Eye Pain    HPI Nina Phillips is a 26 y.o. female.   26 year old female comes in for 1 day history of right eye pain, watering, redness. Patient states she slept with her contacts in last night, woke up with eye watering, took out her contacts and has been very sensitive to light ever since. Pain is throbbing, vision is blurry due to continued watering and without contacts. States contacts are usually monthly disposables, current pair is coming up to 1 month. Denies injury to the eye. Denies URI symptoms such as cough, congestion, sore throat.      Past Medical History:  Diagnosis Date  . Gout     There are no active problems to display for this patient.   History reviewed. No pertinent surgical history.  OB History    Gravida Para Term Preterm AB Living             0   SAB TAB Ectopic Multiple Live Births                   Home Medications    Prior to Admission medications   Medication Sig Start Date End Date Taking? Authorizing Provider  doxycycline (VIBRAMYCIN) 100 MG capsule Take 1 capsule (100 mg total) by mouth 2 (two) times daily. Take with food 08/10/16   Triplett, Tammy, PA-C  HYDROcodone-acetaminophen (NORCO/VICODIN) 5-325 MG tablet Take one tab po q 4 hrs prn pain 08/10/16   Triplett, Tammy, PA-C  meloxicam (MOBIC) 15 MG tablet Take 1 tablet (15 mg total) by mouth daily. 07/26/15   Ivery QualeBryant, Hobson, PA-C  mupirocin nasal ointment (BACTROBAN) 2 % Apply to the affected areas TID x 10 days 08/10/16   Pauline Ausriplett, Tammy, PA-C    Family History Family History  Problem Relation Age of Onset  . Cancer Other   . Hypertension Other   . Diabetes Other     Social History Social History  Substance Use Topics  . Smoking status: Never Smoker  . Smokeless tobacco: Never Used  . Alcohol use Yes     Comment: occas       Allergies   Sulfa antibiotics and Bactrim [sulfamethoxazole-trimethoprim]   Review of Systems Review of Systems  Reason unable to perform ROS: See HPI as above.     Physical Exam Triage Vital Signs ED Triage Vitals [11/24/16 1521]  Enc Vitals Group     BP      Pulse      Resp      Temp      Temp src      SpO2      Weight      Height      Head Circumference      Peak Flow      Pain Score 9     Pain Loc      Pain Edu?      Excl. in GC?    No data found.   Updated Vital Signs BP 121/73 (BP Location: Left Arm)   Pulse 88   Temp 99.9 F (37.7 C) (Oral)   Resp 16   SpO2 98%   Visual Acuity Right Eye Distance:   20/400 ([place]) Left Eye Distance:   20/400 ([place]) Bilateral Distance:    Right Eye Near:   Left Eye  Near:    Bilateral Near:     Physical Exam  Constitutional: She is oriented to person, place, and time. She appears well-developed and well-nourished. No distress.  HENT:  Head: Normocephalic and atraumatic.  Eyes: Pupils are equal, round, and reactive to light. EOM are normal.  Patient without glasses. VA with 20/400 OU. Count fingers > 76ft OU.  Difficult exam due to photophobia and pain not completely relieved by tetracaine. Unable to turn on lights for exam. Conjunctival injection OD. Crescent shaped white defect on right pupil.  IOP (tonopen): 29, 26, 25  Fluorescein stain with uptake of crescent shaped defect on right pupil.   Neurological: She is alert and oriented to person, place, and time.     UC Treatments / Results  Labs (all labs ordered are listed, but only abnormal results are displayed) Labs Reviewed - No data to display  EKG  EKG Interpretation None       Radiology No results found.  Procedures Procedures (including critical care time)  Medications Ordered in UC Medications - No data to display   Initial Impression / Assessment and Plan / UC Course  I have reviewed the triage vital signs and the nursing  notes.  Pertinent labs & imaging results that were available during my care of the patient were reviewed by me and considered in my medical decision making (see chart for details).    Given defect on cornea on fluorescein stain with history of contact lens use. With possible increase IOP. Patient discharged in stable condition to ophthalmology for further evaluation. Resources given.  Final Clinical Impressions(s) / UC Diagnoses   Final diagnoses:  Pain of right eye    New Prescriptions Discharge Medication List as of 11/24/2016  3:41 PM       Belinda Fisher, PA-C 11/24/16 1559

## 2016-11-24 NOTE — ED Triage Notes (Signed)
Pt sts right pain and watering starting this am

## 2016-11-24 NOTE — Discharge Instructions (Signed)
Please head over to the ophthalmologist as soon as possible.

## 2017-02-27 IMAGING — DX DG LUMBAR SPINE COMPLETE 4+V
5 series · 5 of 5 positions shown · non-contrast
Comparison: None.

CLINICAL DATA: 24-year-old female slipped and fell on wet floor.
Acute pain. Initial encounter.

EXAM:
LUMBAR SPINE - COMPLETE 4+ VIEW

[l-spine ap]
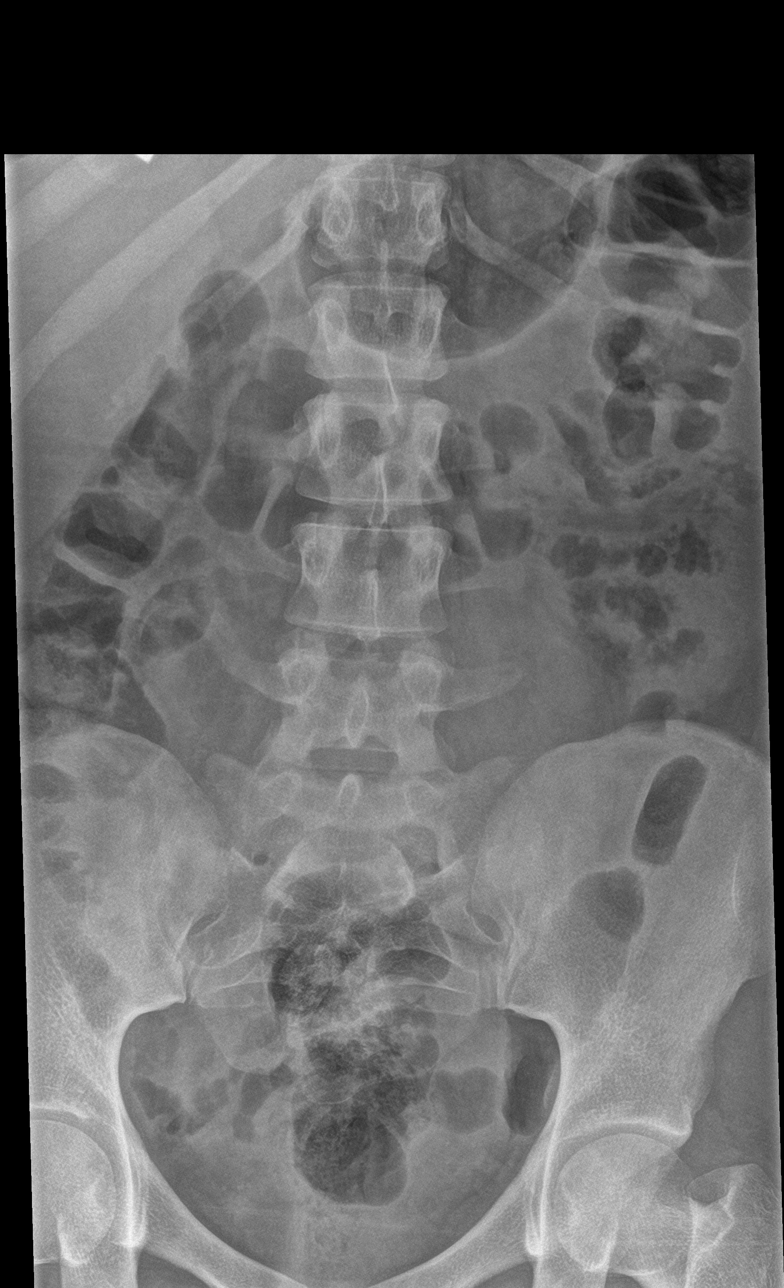

[l-spine obl (1 of 2)]
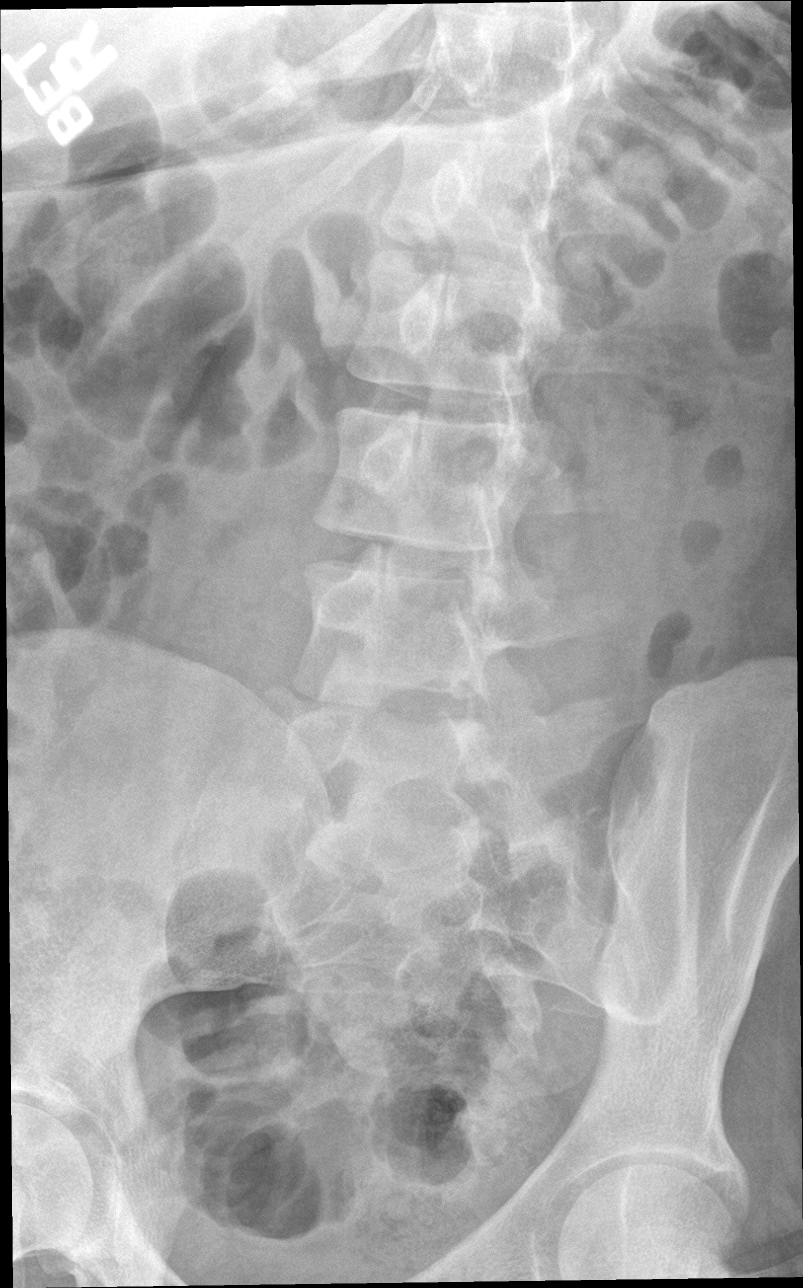

[l-spine obl (2 of 2)]
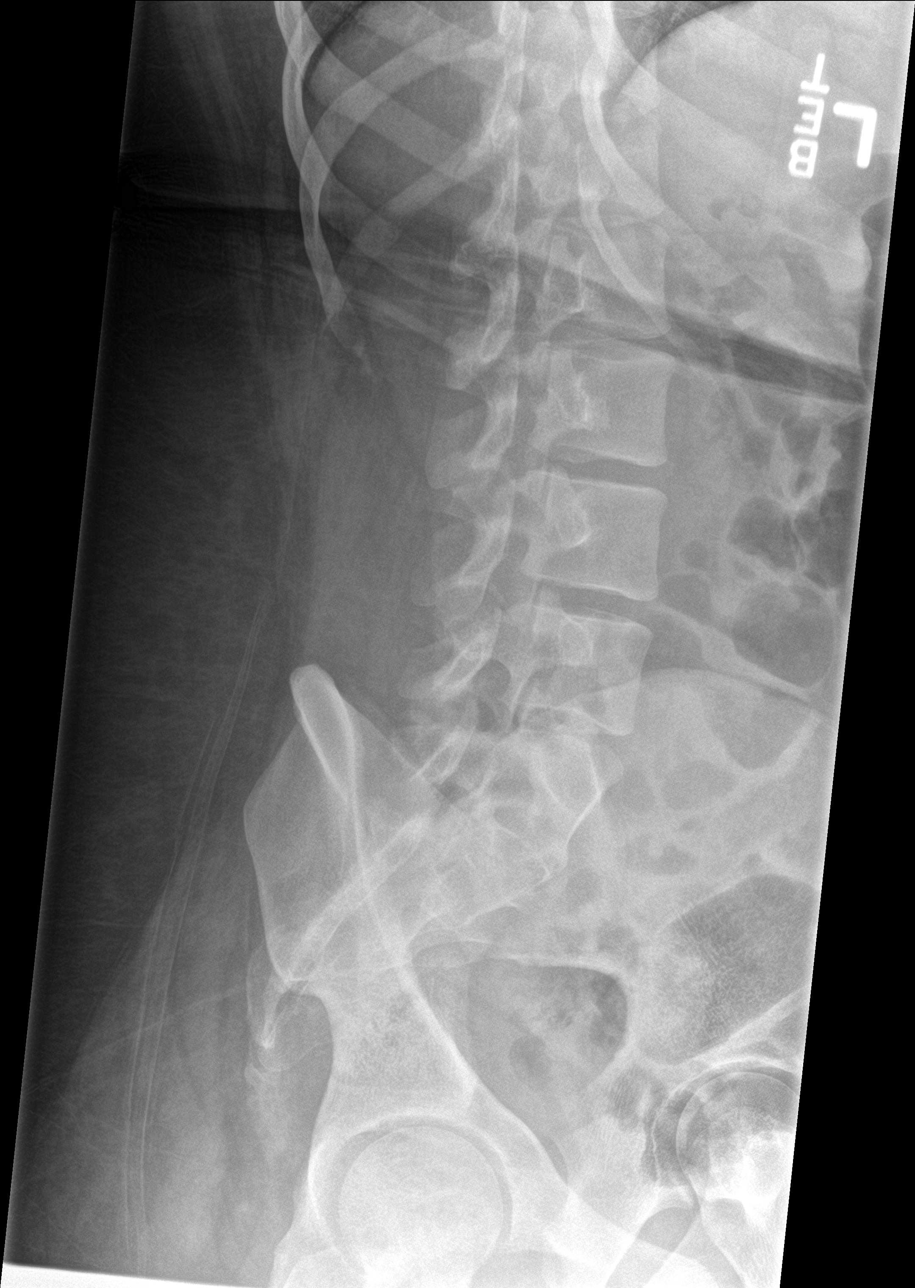

[l-spine lat]
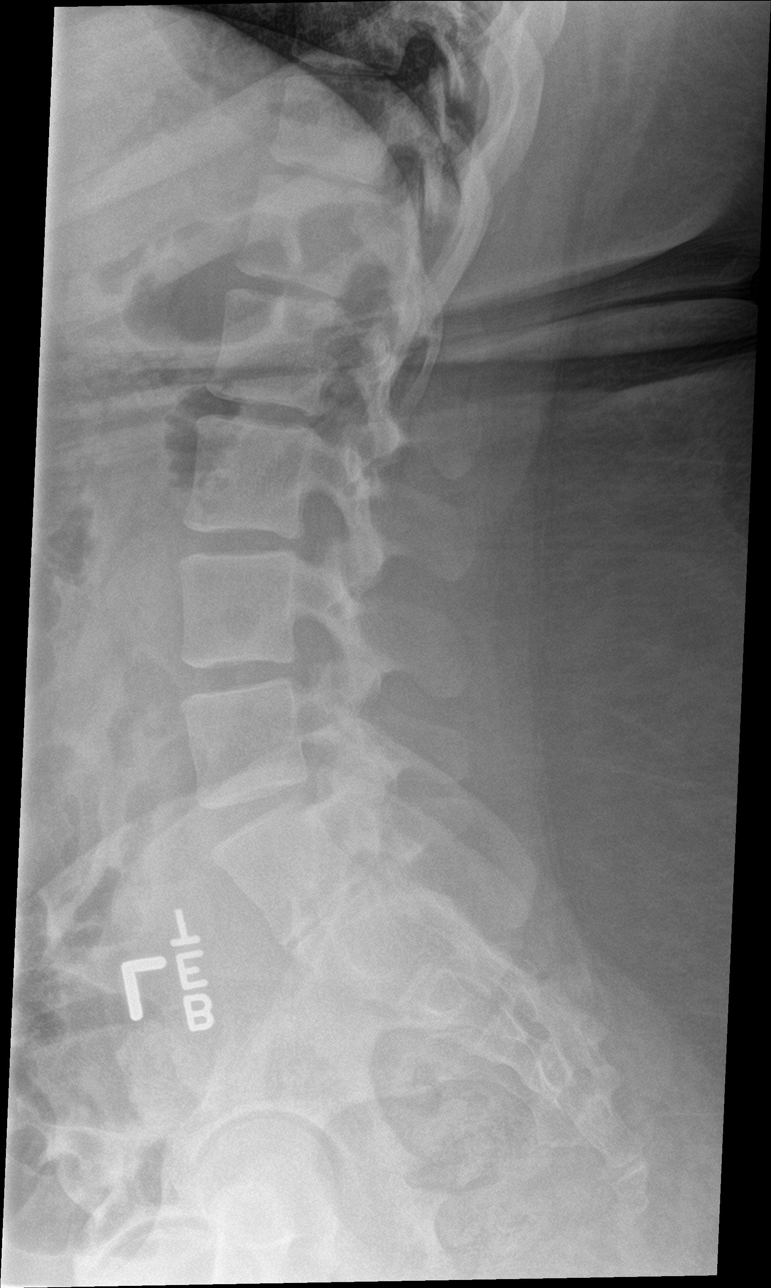

[l-spine spot]
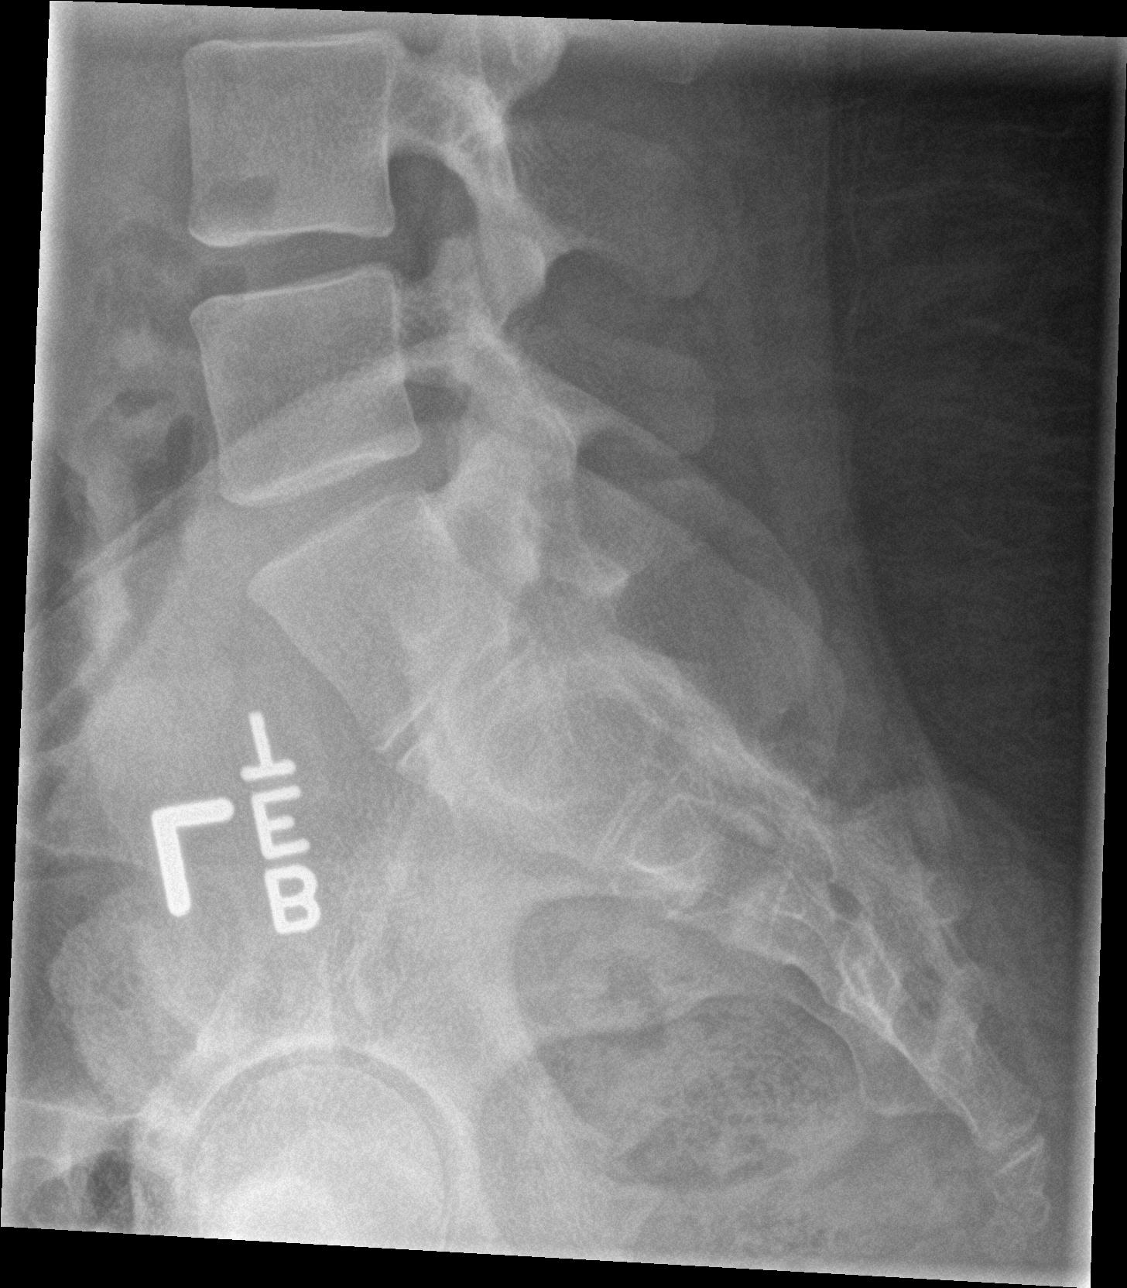

[5 of 5 positions shown; findings below may reference images not displayed]

FINDINGS: Transitional lumbosacral anatomy with mostly sacralized L5 level,
when designating the lowest ribs at T12. Mild retrolisthesis at
L4-L5. Relatively preserved disc spaces. No pars fracture
identified. sacral ala and SI joints within normal limits. Negative
visual bowel gas pattern. Visualized pelvis appears intact.
IMPRESSION: 1.  No acute fracture identified in the lumbar spine.
2. Transitional lumbosacral anatomy. Mild retrolisthesis at L4-L5.
Relatively preserved disc spaces.

## 2017-03-02 ENCOUNTER — Encounter (HOSPITAL_COMMUNITY): Payer: Self-pay | Admitting: *Deleted

## 2017-03-02 ENCOUNTER — Ambulatory Visit (HOSPITAL_COMMUNITY)
Admission: EM | Admit: 2017-03-02 | Discharge: 2017-03-02 | Disposition: A | Discharge status: Home / Self Care | Payer: Medicaid Other | Attending: Family Medicine | Admitting: Family Medicine

## 2017-03-02 DIAGNOSIS — R200 Anesthesia of skin: Secondary | ICD-10-CM | POA: Diagnosis not present

## 2017-03-02 DIAGNOSIS — R202 Paresthesia of skin: Secondary | ICD-10-CM

## 2017-03-02 MED ORDER — PREDNISONE 50 MG PO TABS: 50.0000 mg | ORAL_TABLET | Freq: Every day | ORAL | 0 refills | Status: DC

## 2017-03-02 MED ORDER — PREDNISONE 50 MG PO TABS: 50.0000 mg | ORAL_TABLET | Freq: Every day | ORAL | 0 refills | Status: AC

## 2017-03-02 NOTE — ED Notes (Signed)
Provide patient with a warm blanket

## 2017-03-02 NOTE — Discharge Instructions (Signed)
Your pain seems to be related to nerve pain  I recommend measures to relieve inflammation around the nerves   Use anti-inflammatories. You may take up to 800 mg Ibuprofen every 8 hours with food. You may supplement Ibuprofen with Tylenol [id number] mg every 8 hours.   I have also sent in prednisone to take daily for 5 days.   Please follow up with [facility] (contact info below) to establish care and further evaluation if symptoms persisting. They will also help refer you to neurology if symptoms are persisting.

## 2017-03-02 NOTE — ED Triage Notes (Signed)
Patient reports hands pain x several days, states hands feel tingling and stinging. Patient does repetitive motion at work.

## 2017-03-02 NOTE — ED Provider Notes (Signed)
[facility]    CSN: [id number] Arrival date & time: 03/02/17  1011     History   Chief Complaint Chief Complaint  Patient presents with  . Hand Pain    HPI Nina Phillips is a 27 y.o. female no significant past medical history presenting with bilateral hand tingling and pins and needle sensation with numbness.  This is been going on for approximately 3 days since Saturday.  She states that she recently began working at [employer] 2 weeks ago and has had to work with frozen fruits and vegetables.  She says she wears 3 pairs of gloves but still the cold goes to the gloves and often has this sensation at work.  It typically goes away but on Saturday persisted.  She denies isolation to thumb index and middle finger and denies isolation to pinky and fourth finger.  Denies any numbness or tingling sensations that extend beyond the wrist.  Denies any sensations in her feet.  HPI  Past Medical History:  Diagnosis Date  . Gout     There are no active problems to display for this patient.   History reviewed. No pertinent surgical history.  OB History    Gravida Para Term Preterm AB Living             0   SAB TAB Ectopic Multiple Live Births                   Home Medications    Prior to Admission medications   Medication Sig Start Date End Date Taking? Authorizing Provider  doxycycline (VIBRAMYCIN) 100 MG capsule Take 1 capsule (100 mg total) by mouth 2 (two) times daily. Take with food 08/10/16   Triplett, Tammy, PA-C  HYDROcodone-acetaminophen (NORCO/VICODIN) 5-325 MG tablet Take one tab po q 4 hrs prn pain 08/10/16   Triplett, Tammy, PA-C  meloxicam (MOBIC) 15 MG tablet Take 1 tablet (15 mg total) by mouth daily. 07/26/15   Ivery QualeBryant, Hobson, PA-C  mupirocin nasal ointment (BACTROBAN) 2 % Apply to the affected areas TID x 10 days 08/10/16   Triplett, Tammy, PA-C  predniSONE (DELTASONE) 50 MG tablet Take 1 tablet (50 mg total) by mouth daily for 5 days. 03/02/17 03/07/17   Wieters, Junius CreamerHallie C, PA-C    Family History Family History  Problem Relation Age of Onset  . Cancer Other   . Hypertension Other   . Diabetes Other     Social History Social History   Tobacco Use  . Smoking status: Never Smoker  . Smokeless tobacco: Never Used  Substance Use Topics  . Alcohol use: Yes    Comment: occas  . Drug use: No     Allergies   Sulfa antibiotics and Bactrim [sulfamethoxazole-trimethoprim]   Review of Systems Review of Systems  Constitutional: Negative for fever.  Eyes: Negative for visual disturbance.  Respiratory: Negative for shortness of breath.   Cardiovascular: Negative for chest pain.  Gastrointestinal: Negative for nausea and vomiting.  Musculoskeletal: Negative for myalgias.  Skin: Negative for color change, pallor and wound.  Neurological: Positive for weakness and numbness. Negative for dizziness and headaches.     Physical Exam Triage Vital Signs ED Triage Vitals  Enc Vitals Group     BP 03/02/17 1112 117/68     Pulse Rate 03/02/17 1112 90     Resp 03/02/17 1112 16     Temp 03/02/17 1112 98.4 F (36.9 C)     Temp Source 03/02/17 1112 Oral  SpO2 03/02/17 1112 100 %     Weight --      Height --      Head Circumference --      Peak Flow --      Pain Score 03/02/17 1126 10     Pain Loc --      Pain Edu? --      Excl. in GC? --    No data found.  Updated Vital Signs BP 117/68 (BP Location: Right Arm)   Pulse 90   Temp 98.4 F (36.9 C) (Oral)   Resp 16   SpO2 100%    Physical Exam  Constitutional: She appears well-developed and well-nourished. No distress.  HENT:  Head: Normocephalic and atraumatic.  Eyes: Conjunctivae are normal.  Neck: Neck supple.  Cardiovascular: Normal rate.  No murmur heard. Pulmonary/Chest: Effort normal. No respiratory distress.  Musculoskeletal: She exhibits no edema.  No obvious swelling or deformity to bilateral hands.  Patient does not feel touch distally on palmar surface of  hands, does feel sensation more proximally at rest.  Strength 4/5 bilaterally testing finger abduction and abduction.   Neurological: She is alert.  Skin: Skin is warm and dry.  Psychiatric: She has a normal mood and affect.  Nursing note and vitals reviewed.    UC Treatments / Results  Labs (all labs ordered are listed, but only abnormal results are displayed) Labs Reviewed - No data to display  EKG  EKG Interpretation None       Radiology No results found.  Procedures Procedures (including critical care time)  Medications Ordered in UC Medications - No data to display   Initial Impression / Assessment and Plan / UC Course  I have reviewed the triage vital signs and the nursing notes.  Pertinent labs & imaging results that were available during my care of the patient were reviewed by me and considered in my medical decision making (see chart for details).     Patient with acute onset of bilateral hand numbness tingling.  Given recent correlation with beginning a new job and working with frozen fruits and vegetables that have reproduced this sensation while working, this appears likely related to work versus underlying abnormality.  Seems to be nerve related/nerve pain.  We will try anti-inflammatories along with short course of prednisone for any inflammation of the nerve.  Advised patient to establish care with a primary care clinic for further evaluation of possible B12, diabetes, thyroid, etc or referral to neurology if symptoms persisting..  May consider gabapentin as a possible alternative to her pain if pain not relieved with measures provided today.  Provided patient with note for work stating to limit her working to 10-hour shifts. Discussed strict return precautions. Patient verbalized understanding and is agreeable with plan.   Final Clinical Impressions(s) / UC Diagnoses   Final diagnoses:  Numbness and tingling    ED Discharge Orders        Ordered     predniSONE (DELTASONE) 50 MG tablet  Daily,   Status:  Discontinued     03/02/17 1200    predniSONE (DELTASONE) 50 MG tablet  Daily     03/02/17 1206       Controlled Substance Prescriptions [place] Controlled Substance Registry consulted? Not Applicable   Lew Dawes, [place] 03/02/17 2152

## 2017-03-21 ENCOUNTER — Other Ambulatory Visit: Payer: Self-pay

## 2017-03-21 ENCOUNTER — Ambulatory Visit (HOSPITAL_COMMUNITY): Admission: EM | Admit: 2017-03-21 | Discharge: 2017-03-21 | Discharge status: Absent without leave | Payer: Self-pay

## 2017-03-21 ENCOUNTER — Encounter (HOSPITAL_COMMUNITY): Payer: Self-pay | Admitting: *Deleted

## 2017-03-21 ENCOUNTER — Inpatient Hospital Stay (HOSPITAL_COMMUNITY): Payer: Medicaid Other

## 2017-03-21 ENCOUNTER — Inpatient Hospital Stay (HOSPITAL_COMMUNITY)
Admission: AD | Admit: 2017-03-21 | Discharge: 2017-03-21 | Disposition: A | Discharge status: Home / Self Care | Payer: Medicaid Other | Source: Ambulatory Visit | Attending: Obstetrics & Gynecology | Admitting: Obstetrics & Gynecology

## 2017-03-21 DIAGNOSIS — Z833 Family history of diabetes mellitus: Secondary | ICD-10-CM | POA: Diagnosis not present

## 2017-03-21 DIAGNOSIS — Z3201 Encounter for pregnancy test, result positive: Secondary | ICD-10-CM | POA: Diagnosis not present

## 2017-03-21 DIAGNOSIS — Z809 Family history of malignant neoplasm, unspecified: Secondary | ICD-10-CM | POA: Diagnosis not present

## 2017-03-21 DIAGNOSIS — O3402 Maternal care for unspecified congenital malformation of uterus, second trimester: Secondary | ICD-10-CM | POA: Insufficient documentation

## 2017-03-21 DIAGNOSIS — Z9889 Other specified postprocedural states: Secondary | ICD-10-CM | POA: Diagnosis not present

## 2017-03-21 DIAGNOSIS — O209 Hemorrhage in early pregnancy, unspecified: Secondary | ICD-10-CM | POA: Diagnosis not present

## 2017-03-21 DIAGNOSIS — Q513 Bicornate uterus: Secondary | ICD-10-CM | POA: Diagnosis not present

## 2017-03-21 DIAGNOSIS — Z882 Allergy status to sulfonamides status: Secondary | ICD-10-CM | POA: Diagnosis not present

## 2017-03-21 DIAGNOSIS — Z3A01 Less than 8 weeks gestation of pregnancy: Secondary | ICD-10-CM | POA: Diagnosis not present

## 2017-03-21 DIAGNOSIS — Z881 Allergy status to other antibiotic agents status: Secondary | ICD-10-CM | POA: Insufficient documentation

## 2017-03-21 DIAGNOSIS — O4691 Antepartum hemorrhage, unspecified, first trimester: Secondary | ICD-10-CM | POA: Diagnosis not present

## 2017-03-21 DIAGNOSIS — Z3491 Encounter for supervision of normal pregnancy, unspecified, first trimester: Secondary | ICD-10-CM

## 2017-03-21 DIAGNOSIS — Z8249 Family history of ischemic heart disease and other diseases of the circulatory system: Secondary | ICD-10-CM | POA: Diagnosis not present

## 2017-03-21 DIAGNOSIS — O4690 Antepartum hemorrhage, unspecified, unspecified trimester: Secondary | ICD-10-CM

## 2017-03-21 DIAGNOSIS — Z6790 Unspecified blood type, Rh positive: Secondary | ICD-10-CM | POA: Diagnosis not present

## 2017-03-21 LAB — CBC
HEMATOCRIT: 33.2 % — AB (ref 36.0–46.0)
Hemoglobin: 10.7 g/dL — ABNORMAL LOW (ref 12.0–15.0)
MCH: 22.6 pg — ABNORMAL LOW (ref 26.0–34.0)
MCHC: 32.2 g/dL (ref 30.0–36.0)
MCV: 70.2 fL — ABNORMAL LOW (ref 78.0–100.0)
Platelets: 265 10*3/uL (ref 150–400)
RBC: 4.73 MIL/uL (ref 3.87–5.11)
RDW: 17.1 % — AB (ref 11.5–15.5)
WBC: 10.5 10*3/uL (ref 4.0–10.5)

## 2017-03-21 LAB — WET PREP, GENITAL
Clue Cells Wet Prep HPF POC: NONE SEEN
SPERM: NONE SEEN
TRICH WET PREP: NONE SEEN
YEAST WET PREP: NONE SEEN

## 2017-03-21 LAB — URINALYSIS, ROUTINE W REFLEX MICROSCOPIC
Bilirubin Urine: NEGATIVE
Glucose, UA: >=500 mg/dL — AB
KETONES UR: NEGATIVE mg/dL
Nitrite: NEGATIVE
PH: 6 (ref 5.0–8.0)
Protein, ur: 30 mg/dL — AB
Specific Gravity, Urine: 1.017 (ref 1.005–1.030)

## 2017-03-21 LAB — HCG, QUANTITATIVE, PREGNANCY: hCG, Beta Chain, Quant, S: 33062 m[IU]/mL — ABNORMAL HIGH (ref <5)

## 2017-03-21 LAB — ABO/RH: ABO/RH(D): O POS

## 2017-03-21 LAB — POCT PREGNANCY, URINE: Preg Test, Ur: POSITIVE — AB

## 2017-03-21 NOTE — Discharge Instructions (Signed)
Vaginal Bleeding During Pregnancy, First Trimester °A small amount of bleeding (spotting) from the vagina is common in early pregnancy. Sometimes the bleeding is normal and is not a problem, and sometimes it is a sign of something serious. Be sure to tell your doctor about any bleeding from your vagina right away. °Follow these instructions at home: °· Watch your condition for any changes. °· Follow your doctor's instructions about how active you can be. °· If you are on bed rest: °? You may need to stay in bed and only get up to use the bathroom. °? You may be allowed to do some activities. °? If you need help, make plans for someone to help you. °· Write down: °? The number of pads you use each day. °? How often you change pads. °? How soaked (saturated) your pads are. °· Do not use tampons. °· Do not douche. °· Do not have sex or orgasms until your doctor says it is okay. °· If you pass any tissue from your vagina, save the tissue so you can show it to your doctor. °· Only take medicines as told by your doctor. °· Do not take aspirin because it can make you bleed. °· Keep all follow-up visits as told by your doctor. °Contact a doctor if: °· You bleed from your vagina. °· You have cramps. °· You have labor pains. °· You have a fever that does not go away after you take medicine. °Get help right away if: °· You have very bad cramps in your back or belly (abdomen). °· You pass large clots or tissue from your vagina. °· You bleed more. °· You feel light-headed or weak. °· You pass out (faint). °· You have chills. °· You are leaking fluid or have a gush of fluid from your vagina. °· You pass out while pooping (having a bowel movement). °This information is not intended to replace advice given to you by your health care provider. Make sure you discuss any questions you have with your health care provider. °Document Released: 05/28/2013 Document Revised: 06/19/2015 Document Reviewed: 09/18/2012 °Elsevier Interactive  Patient Education © 2018 Elsevier Inc. ° °

## 2017-03-21 NOTE — MAU Provider Note (Signed)
History     CSN: [id number]  Arrival date and time: 03/21/17 1023   First Provider Initiated Contact with Patient 03/21/17 1106      Chief Complaint  Patient presents with  . Vaginal Bleeding  . Possible Pregnancy   G1 @[redacted]w[redacted]d  by sure LMP here with spotting. She noticed pink spotting on the toilet paper around 7 this am. No abdominal or pelvic pain. Had IC last night.   OB History    Gravida Para Term Preterm AB Living   1         0   SAB TAB Ectopic Multiple Live Births                  Past Medical History:  Diagnosis Date  . Gout     Past Surgical History:  Procedure Laterality Date  . WISDOM TOOTH EXTRACTION      Family History  Problem Relation Age of Onset  . Cancer Other   . Hypertension Other   . Diabetes Other     Social History   Tobacco Use  . Smoking status: Never Smoker  . Smokeless tobacco: Never Used  Substance Use Topics  . Alcohol use: No    Frequency: Never    Comment: occas  . Drug use: No    Allergies:  Allergies  Allergen Reactions  . Bactrim [Sulfamethoxazole-Trimethoprim] Rash  . Sulfa Antibiotics Rash    Medications Prior to Admission  Medication Sig Dispense Refill Last Dose  . Prenatal MV-Min-FA-Omega-3 (PRENATAL GUMMIES/DHA & FA PO) Take 2 tablets by mouth daily.   03/21/2017 at Unknown time    Review of Systems  Gastrointestinal: Negative for abdominal pain.  Genitourinary: Positive for vaginal bleeding.   Physical Exam   Blood pressure (!) 143/82, pulse 72, temperature 98.4 F (36.9 C), temperature source Oral, resp. rate 16, height 5\' 3"  (1.6 m), weight 226 lb (102.5 kg), last menstrual period 01/28/2017, SpO2 100 %.  Physical Exam  Constitutional: She is oriented to person, place, and time. She appears well-developed and well-nourished. No distress.  HENT:  Head: Normocephalic.  Neck: Normal range of motion.  Cardiovascular: Normal rate.  Respiratory: Effort normal.  GI: Soft. She exhibits no distension and  no mass. There is no tenderness. There is no rebound and no guarding.  Genitourinary:  Genitourinary Comments: External: no lesions or erythema Vagina: rugated, pink, moist, scant bloody discharge, cervix closed Uterus: non enlarged, anteverted, non tender, no CMT Adnexae: no masses, no tenderness left, no tenderness right   Musculoskeletal: Normal range of motion.  Neurological: She is alert and oriented to person, place, and time.  Skin: Skin is warm and dry.  Psychiatric: She has a normal mood and affect.   Results for orders placed or performed during the hospital encounter of 03/21/17 (from the past 24 hour(s))  Urinalysis, Routine w reflex microscopic     Status: Abnormal   Collection Time: 03/21/17 10:40 AM  Result Value Ref Range   Color, Urine YELLOW YELLOW   APPearance HAZY (A) CLEAR   Specific Gravity, Urine 1.017 1.005 - 1.030   pH 6.0 5.0 - 8.0   Glucose, UA >=500 (A) NEGATIVE mg/dL   Hgb urine dipstick LARGE (A) NEGATIVE   Bilirubin Urine NEGATIVE NEGATIVE   Ketones, ur NEGATIVE NEGATIVE mg/dL   Protein, ur 30 (A) NEGATIVE mg/dL   Nitrite NEGATIVE NEGATIVE   Leukocytes, UA MODERATE (A) NEGATIVE   RBC / HPF 0-5 0 - 5 RBC/hpf   WBC, UA  6-30 0 - 5 WBC/hpf   Bacteria, UA RARE (A) NONE SEEN   Squamous Epithelial / LPF 6-30 (A) NONE SEEN   Mucus PRESENT   Pregnancy, urine POC     Status: Abnormal   Collection Time: 03/21/17 10:52 AM  Result Value Ref Range   Preg Test, Ur POSITIVE (A) NEGATIVE   [place] Ob Less Than 14 Weeks With Ob Transvaginal  Result Date: 03/21/2017 CLINICAL DATA:  Vaginal bleeding in pregnancy. Gestational age by LMP of 7 weeks 3 days. EXAM: OBSTETRIC <14 WK US AND TRANSVAGINAL OB US TECHNIQUE: Both transabdominal and transvaginal ultrasound examinations were performed for complete evaluation of the gestation as well as the maternal uterus, adnexal regions, and pelvic cul-de-sac. Transvaginal technique was performed to assess early pregnancy.  COMPARISON:  None. FINDINGS: Intrauterine gestational sac: Single Yolk sac:  Visualized. Embryo:  Visualized. Cardiac Activity: Visualized. Heart Rate: 157 bpm CRL:  13 mm   7 w   3 d                  US EDC: 11/04/2017 Subchorionic hemorrhage:  None visualized. Maternal uterus/adnexae: Uterine myometrial septum is seen, with the IUP in the right uterine horn. There appears to be fundal myometrial cleft, which may be seen with bicornuate uterus or uterine didelphys. Both ovaries are normal appearance. No evidence of adnexal mass or fluid. IMPRESSION: Single living IUP measuring 7 weeks 3 days, which is concordant with LMP. 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 Consider pelvic MRI without contrast for further evaluation. Normal appearance of both ovaries.  No adnexal mass identified. Electronically Signed   By: Myles RosenthalJohn  Stahl M.D.   On: 03/21/2017 13:08   MAU Course  Procedures  MDM Labs and US ordered and reviewed. Normal IUP on US, bicornuate uterus noted. Bleeding could be post-coital in etiology. Discussed findings with pt. Stable for discharge home.  Assessment and Plan   1. [redacted] weeks gestation of pregnancy   2. Vaginal bleeding in pregnancy   3. Normal intrauterine pregnancy on prenatal ultrasound in first trimester   4. Bicornuate uterus   5. Blood type, Rh positive    Discharge home Follow up with OBGYN provider of choice to start care SAB precautions Continue PNV  Allergies as of 03/21/2017      Reactions   Bactrim [sulfamethoxazole-trimethoprim] Rash   Sulfa Antibiotics Rash      Medication List    TAKE these medications   PRENATAL GUMMIES/DHA & FA PO Take 2 tablets by mouth daily.      Donette LarryMelanie Neena Beecham, CNM 03/21/2017, 11:37 AM

## 2017-03-21 NOTE — MAU Note (Signed)
+  HPTs over the weekend.  Had some bleeding today. No pain.  Is worried, wants to make sure everything is ok.

## 2017-03-22 LAB — GC/CHLAMYDIA PROBE AMP (~~LOC~~) NOT AT ARMC
CHLAMYDIA, DNA PROBE: NEGATIVE
Neisseria Gonorrhea: NEGATIVE

## 2017-04-11 ENCOUNTER — Ambulatory Visit (INDEPENDENT_AMBULATORY_CARE_PROVIDER_SITE_OTHER): Payer: Medicaid Other | Admitting: Women's Health

## 2017-04-11 ENCOUNTER — Other Ambulatory Visit (INDEPENDENT_AMBULATORY_CARE_PROVIDER_SITE_OTHER): Payer: Medicaid Other

## 2017-04-11 ENCOUNTER — Other Ambulatory Visit (HOSPITAL_COMMUNITY)
Admission: RE | Admit: 2017-04-11 | Discharge: 2017-04-11 | Disposition: A | Discharge status: Home / Self Care | Payer: Medicaid Other | Source: Ambulatory Visit | Attending: Obstetrics & Gynecology | Admitting: Obstetrics & Gynecology

## 2017-04-11 ENCOUNTER — Ambulatory Visit: Payer: Self-pay | Admitting: *Deleted

## 2017-04-11 ENCOUNTER — Encounter: Payer: Self-pay | Admitting: Women's Health

## 2017-04-11 ENCOUNTER — Other Ambulatory Visit: Payer: Self-pay | Admitting: Women's Health

## 2017-04-11 VITALS — BP 110/80 | HR 72 | Wt 230.0 lb

## 2017-04-11 DIAGNOSIS — O021 Missed abortion: Secondary | ICD-10-CM

## 2017-04-11 DIAGNOSIS — O3680X0 Pregnancy with inconclusive fetal viability, not applicable or unspecified: Secondary | ICD-10-CM

## 2017-04-11 DIAGNOSIS — Z3A10 10 weeks gestation of pregnancy: Secondary | ICD-10-CM | POA: Insufficient documentation

## 2017-04-11 DIAGNOSIS — Z1389 Encounter for screening for other disorder: Secondary | ICD-10-CM

## 2017-04-11 DIAGNOSIS — Q513 Bicornate uterus: Secondary | ICD-10-CM | POA: Diagnosis not present

## 2017-04-11 DIAGNOSIS — Z3401 Encounter for supervision of normal first pregnancy, first trimester: Secondary | ICD-10-CM | POA: Insufficient documentation

## 2017-04-11 DIAGNOSIS — Z3A08 8 weeks gestation of pregnancy: Secondary | ICD-10-CM | POA: Diagnosis not present

## 2017-04-11 DIAGNOSIS — Z331 Pregnant state, incidental: Secondary | ICD-10-CM

## 2017-04-11 DIAGNOSIS — O34591 Maternal care for other abnormalities of gravid uterus, first trimester: Secondary | ICD-10-CM | POA: Diagnosis not present

## 2017-04-11 DIAGNOSIS — N9089 Other specified noninflammatory disorders of vulva and perineum: Secondary | ICD-10-CM

## 2017-04-11 LAB — POCT URINALYSIS DIPSTICK
Glucose, UA: NEGATIVE
KETONES UA: NEGATIVE
Leukocytes, UA: NEGATIVE
NITRITE UA: NEGATIVE
RBC UA: NEGATIVE

## 2017-04-11 MED ORDER — MISOPROSTOL 200 MCG PO TABS: 800.0000 ug | ORAL_TABLET | Freq: Once | ORAL | 1 refills | Status: DC

## 2017-04-11 MED ORDER — HYDROCODONE-ACETAMINOPHEN 5-325 MG PO TABS: 1.0000 | ORAL_TABLET | Freq: Four times a day (QID) | ORAL | 0 refills | Status: DC | PRN

## 2017-04-11 NOTE — Patient Instructions (Signed)
FACTS YOU SHOULD KNOW  About Early Pregnancy Loss  WHAT IS AN EARLY PREGNANCY LOSS? Once the egg is fertilized with the sperm and begins to develop, it attaches to the lining of the uterus. This early pregnancy tissue may not develop into an embryo (the beginning stage of a baby). Sometimes an embryo does develop but does not continue to grow. These problems can be seen on ultrasound.   MANAGEMNT OF EARLY PREGNANCY LOSS: About 4 out of 100 (0.25%) women will have a pregnancy loss in her lifetime.  One in five pregnancies is found to be an early pregnancy loss.  There are 3 ways to care for an early pregnancy loss:   (1) Surgery, (2) Medicine, (3) Waiting for you to pass the pregnancy on your own. The decision as to how to proceed after being diagnosed with and early pregnancy loss is an individual one.  The decision can be made only after appropriate counseling.  You need to weigh the pros and cons of the 3 choices. Then you can make the choice that works for you.  SURGERY (D&E) . Procedure over in 1 day . Requires being put to sleep . Bleeding may be light . Possible problems during surgery, including injury to womb(uterus) . Care provider has more control Medicine (CYTOTEC) . The complete procedure may take days to weeks . No Surgery . Bleeding may be heavy at times . There may be drug side effects . Patient has more control Waiting . You may choose to wait, in which case your own body may complete the passing of the abnormal early pregnancy on its own in about 2-4 weeks . Your bleeding may be heavy at times . There is a small possibility that you may need surgery if the bleeding is too much or not all of the pregnancy has passed.  CYTOTEC MANAGEMENT Prostaglandins (cytotec) are the most widely used drug for this purpose. They cause the uterus to cramp and contract. You will place the medicine yourself inside your vagina in the privacy of your home. Empting of the uterus should occur  within 3 days but the process may continue for several weeks. The bleeding may seem heavy at times.  INSTRUCTIONS: Take all 4 tablets of cytotec (800mcg total) at one time. This will cause a lot of cramping, you may have bleeding, and pass tissue, then the cramping and bleeding should get better. If you do not pass the tissue, then you can take 4 more tablets of cytotec (800mcg total) 48 hours after your first dose.  You will come back to have your blood drawn to make sure the pregnancy hormones are dropping in 1 week. Please call us if you have any questions.   POSSIBLE SIDE EFFECTS FROM CYTOTEC . Nausea  Vomiting . Diarrhea Fever . Chills  Hot Flashes Side effects  from the process of the early pregnancy loss include: . Cramping  Bleeding . Headaches  Dizziness RISKS: This is a low risk procedure. Less than 1 in 100 women has a complication. An incomplete passage of the early pregnancy may occur. Also, hemorrhage (heavy bleeding) could happen.  Rarely the pregnancy will not be passed completely. Excessively heavy bleeding may occur.  Your doctor may need to perform surgery to empty the uterus (D&E). Afterwards: Everybody will feel differently after the early pregnancy loss completion. You may have soreness or cramps for a day or two. You may have soreness or cramps for day or two.  You may have light   bleeding for up to 2 weeks. You may be as active as you feel like being. If you have any of the following problems you may call [facility] at [phone] or [facility] at [phone] if it is after hours. . If you have pain that does not get better with pain medication . Bleeding that soaks through 2 thick full-sized sanitary pads in an hour . Cramps that last longer than 2 days . Foul smelling discharge . Fever above 100.4 degrees F Even if you do not have any of these symptoms, you should have a follow-up exam to make sure you are healing properly. Your next normal period will  usually start again in 4-6 week after the loss. You can get pregnant soon after the loss, so use birth control right away. Finally: Make sure all your questions are answered before during and after any procedure. Follow up with medical care and family planning methods.      

## 2017-04-11 NOTE — Progress Notes (Signed)
GYN VISIT Patient name: Nina Phillips MRN [id number]  Date of birth: [birth year] Chief Complaint:   Initial Prenatal Visit (nausea)  History of Present Illness:   Nina Phillips is a 27 y.o. G1P0 African American female at [redacted]w[redacted]d by LMP c/w 7wk u/s, being seen today initially for new ob visit, however unable to obtain FHR on informal transabominal u/s- wored in w/ Amber who confirmed no FCA w/ CRL c/w [redacted]w[redacted]d.  Denies cramping or bleeding.  Pt does report 2 painful bumps Lt labia x a few days, thinks she has herpes, has never been dx. Has been getting outbreaks occ for past year or so.    Patient's last menstrual period was 01/28/2017. Review of Systems:   Pertinent items are noted in HPI Denies fever/chills, dizziness, headaches, visual disturbances, fatigue, shortness of breath, chest pain, abdominal pain, vomiting, abnormal vaginal discharge/itching/odor/irritation, problems with periods, bowel movements, urination, or intercourse unless otherwise stated above.  Pertinent History Reviewed:  Reviewed past medical,surgical, social, obstetrical and family history.  Reviewed problem list, medications and allergies. Physical Assessment:   Vitals:   04/11/17 1448  BP: 110/80  Pulse: 72  Weight: 230 lb (104.3 kg)  Body mass index is 40.74 kg/m.       Physical Examination:   General appearance: alert, well appearing, and in no distress  Mental status: alert, oriented to person, place, and time  Skin: warm & dry   Cardiovascular: normal heart rate noted  Respiratory: normal respiratory effort, no distress  Abdomen: soft, non-tender   Pelvic: VULVA: 2 small white ulcerated lesions Lt labia suspicious for HSV, culture obtained, no masses, tenderness, VAGINA: normal appearing vagina with normal color and discharge, no lesions, CERVIX: normal appearing cervix without discharge or lesions. Thin prep pap obtained (prior to dx of missed Ab)  Extremities: no edema   Today's Formal U/S: [name]  TA/TV: 8+5 wks,single IUP mid uterus,NO FHT,crl 21.29 mm,bicornuate uterus,normal ovaries bilat,Kim discussed results w/pt   Results for orders placed or performed in visit on 04/11/17 (from the past 24 hour(s))  POCT urinalysis dipstick   Collection Time: 04/11/17  3:20 PM  Result Value Ref Range   Color, UA     Clarity, UA     Glucose, UA neg    Bilirubin, UA     Ketones, UA neg    Spec Grav, UA  1.010 - 1.025   Blood, UA neg    pH, UA  5.0 - 8.0   Protein, UA 1+    Urobilinogen, UA  0.2 or 1.0 E.U./dL   Nitrite, UA neg    Leukocytes, UA Negative Negative   Appearance     Odor    Beta hCG quant (ref lab)   Collection Time: 04/11/17  4:47 PM  Result Value Ref Range   hCG Quant 15,845 mIU/mL  CBC   Collection Time: 04/11/17  4:47 PM  Result Value Ref Range   WBC 9.5 3.4 - 10.8 x10E3/uL   RBC 4.71 3.77 - 5.28 x10E6/uL   Hemoglobin 10.5 (L) 11.1 - 15.9 g/dL   Hematocrit 13.2 (L) 44.0 - 46.6 %   MCV 71 (L) 79 - 97 fL   MCH 22.3 (L) 26.6 - 33.0 pg   MCHC 31.3 (L) 31.5 - 35.7 g/dL   RDW 10.2 (H) 72.5 - 36.6 %   Platelets 433 (H) 150 - 379 x10E3/uL  HSV(herpes simplex vrs) 1+2 ab-IgG   Collection Time: 04/11/17  4:47 PM  Result Value Ref Range   HSV  1 Glycoprotein G Ab, IgG <0.91 0.00 - 0.90 index   HSV 2 IgG, Type Spec 16.70 (H) 0.00 - 0.90 index  ABO/Rh   Collection Time: 04/11/17  4:47 PM  Result Value Ref Range   ABO Grouping O    Rh Factor Positive     Assessment & Plan:  1) 4138w3d Missed Ab w/ CRL c/w 2262w5d> discussed options of expectant management, vs. Cytotec, vs. D&C- discussed this is usually used as a last option d/t risks w/ surgery. Pt elects cytotec. Rx cytotec 800mcg pv x 1, repeat in 48hrs if needed. Rx hydrocodone 5/325mg  #10 w/ 0RF. Discussed what to expect w/ miscarriage as well as warning s/s to report, reasons to seek care. Will get baseline BHCG today as well as CBC and ABO/Rh.   2) Vulvar lesions> suspicious for HSV, send HSV culture and will obtain  HSV 1&2 Ab. Pt very upset about missed Ab- will discuss antivirals in future  Meds:  Meds ordered this encounter  Medications  . misoprostol (CYTOTEC) 200 MCG tablet    Sig: Take 4 tablets (800 mcg total) by mouth once for 1 dose. Repeat in 48 hours if needed    Dispense:  4 tablet    Refill:  1    Order Specific Question:   Supervising Provider    Answer:   Despina HiddenEURE, LUTHER H [[id number]]  . HYDROcodone-acetaminophen (NORCO/VICODIN) 5-325 MG tablet    Sig: Take 1-2 tablets by mouth every 6 (six) hours as needed for moderate pain or severe pain.    Dispense:  10 tablet    Refill:  0    Order Specific Question:   Supervising Provider    Answer:   Duane LopeEURE, LUTHER H [[id number]]    Orders Placed This Encounter  Procedures  . Herpes simplex virus culture  . Beta hCG quant (ref lab)  . CBC  . HSV(herpes simplex vrs) 1+2 ab-IgG  . POCT urinalysis dipstick  . ABO/Rh    Return in about 1 week (around 04/18/2017) for F/U GYN.  Cheral MarkerKimberly R Lum Stillinger CNM, [id number] 04/11/17

## 2017-04-11 NOTE — Progress Notes (Signed)
US TA/TV: 8+5 wks,single IUP mid uterus,NO FHT,crl 21.29 mm,bicornuate uterus,normal ovaries bilat,Kim discussed results w/pt

## 2017-04-12 ENCOUNTER — Encounter: Payer: Self-pay | Admitting: Women's Health

## 2017-04-12 DIAGNOSIS — B009 Herpesviral infection, unspecified: Secondary | ICD-10-CM | POA: Insufficient documentation

## 2017-04-12 DIAGNOSIS — O021 Missed abortion: Secondary | ICD-10-CM | POA: Insufficient documentation

## 2017-04-12 LAB — BETA HCG QUANT (REF LAB): hCG Quant: 15845 m[IU]/mL

## 2017-04-12 LAB — PMP SCREEN PROFILE (10S), URINE
AMPHETAMINE SCREEN URINE: NEGATIVE ng/mL
BARBITURATE SCREEN URINE: NEGATIVE ng/mL
BENZODIAZEPINE SCREEN, URINE: NEGATIVE ng/mL
CANNABINOIDS UR QL SCN: POSITIVE ng/mL — AB
COCAINE(METAB.)SCREEN, URINE: NEGATIVE ng/mL
Creatinine(Crt), U: 128.4 mg/dL (ref 20.0–300.0)
Methadone Screen, Urine: NEGATIVE ng/mL
OXYCODONE+OXYMORPHONE UR QL SCN: NEGATIVE ng/mL
Opiate Scrn, Ur: NEGATIVE ng/mL
PHENCYCLIDINE QUANTITATIVE URINE: NEGATIVE ng/mL
Ph of Urine: 6 (ref 4.5–8.9)
Propoxyphene Scrn, Ur: NEGATIVE ng/mL

## 2017-04-12 LAB — CBC
Hematocrit: 33.6 % — ABNORMAL LOW (ref 34.0–46.6)
Hemoglobin: 10.5 g/dL — ABNORMAL LOW (ref 11.1–15.9)
MCH: 22.3 pg — ABNORMAL LOW (ref 26.6–33.0)
MCHC: 31.3 g/dL — ABNORMAL LOW (ref 31.5–35.7)
MCV: 71 fL — ABNORMAL LOW (ref 79–97)
PLATELETS: 433 10*3/uL — AB (ref 150–379)
RBC: 4.71 x10E6/uL (ref 3.77–5.28)
RDW: 18.6 % — AB (ref 12.3–15.4)
WBC: 9.5 10*3/uL (ref 3.4–10.8)

## 2017-04-12 LAB — ABO/RH: Rh Factor: POSITIVE

## 2017-04-12 LAB — HSV(HERPES SIMPLEX VRS) I + II AB-IGG: HSV 2 IGG, TYPE SPEC: 16.7 {index} — AB (ref 0.00–0.90)

## 2017-04-13 LAB — CYTOLOGY - PAP
Chlamydia: NEGATIVE
Diagnosis: NEGATIVE
Neisseria Gonorrhea: NEGATIVE

## 2017-04-13 LAB — URINE CULTURE

## 2017-04-14 LAB — HERPES SIMPLEX VIRUS CULTURE

## 2017-04-18 ENCOUNTER — Other Ambulatory Visit: Payer: Self-pay

## 2017-04-18 ENCOUNTER — Encounter: Payer: Self-pay | Admitting: Women's Health

## 2017-04-18 ENCOUNTER — Ambulatory Visit (INDEPENDENT_AMBULATORY_CARE_PROVIDER_SITE_OTHER): Payer: Medicaid Other | Admitting: Women's Health

## 2017-04-18 VITALS — BP 120/72 | HR 86 | Ht 63.0 in | Wt 235.0 lb

## 2017-04-18 DIAGNOSIS — Z113 Encounter for screening for infections with a predominantly sexual mode of transmission: Secondary | ICD-10-CM | POA: Diagnosis not present

## 2017-04-18 DIAGNOSIS — B009 Herpesviral infection, unspecified: Secondary | ICD-10-CM | POA: Diagnosis not present

## 2017-04-18 DIAGNOSIS — Z3A08 8 weeks gestation of pregnancy: Secondary | ICD-10-CM

## 2017-04-18 DIAGNOSIS — O039 Complete or unspecified spontaneous abortion without complication: Secondary | ICD-10-CM | POA: Diagnosis not present

## 2017-04-18 NOTE — Progress Notes (Signed)
   GYN VISIT Patient name: Nina Phillips MRN [id number]  Date of birth: 1990/10/13 Chief Complaint:   Follow-up (SAB)  History of Present Illness:   Nina Phillips is a 27 y.o. 101P0010 African American female being seen today for f/u missed Ab dx 04/11/17 at 5065w3d by LMP. CRL at that time was 5350w5d w/o FCA.  She elected for cytotec, which she took, and everything came out in a complete sac and she opened it up to be able to see baby. Cramping/bleeding improving. Does want to try for another pregnancy. Still taking pnv. Denies etoh/illicit drug use/chronic conditions. 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 States lesions have completely resolved, only gets them every few months, not interested in suppression therapy at this time.   Wants STD screen, tested for gc/ct last visit, both were neg.   No LMP recorded.  Last pap 04/11/17. Results were:  normal Review of Systems:   Pertinent items are noted in HPI Denies fever/chills, dizziness, headaches, visual disturbances, fatigue, shortness of breath, chest pain, abdominal pain, vomiting, abnormal vaginal discharge/itching/odor/irritation, problems with periods, bowel movements, urination, or intercourse unless otherwise stated above.  Pertinent History Reviewed:  Reviewed past medical,surgical, social, obstetrical and family history.  Reviewed problem list, medications and allergies. Physical Assessment:   Vitals:   04/18/17 1601  BP: 120/72  Pulse: 86  Weight: 235 lb (106.6 kg)  Height: 5\' 3"  (1.6 m)  Body mass index is 41.63 kg/m.       Physical Examination:   General appearance: alert, well appearing, and in no distress  Mental status: alert, oriented to person, place, and time  Skin: warm & dry   Cardiovascular: normal heart rate noted  Respiratory: normal respiratory effort, no distress  Abdomen: soft, non-tender   Pelvic: examination not indicated  Extremities: no edema   No results found for this or any previous  visit (from the past 24 hour(s)).  Assessment & Plan:  1) Completed SAB> will check BHCG today and q 1-2wks until <5. Rh+. Wants to try again for pregnancy, wait until levels <5, continue taking pnv.   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   Orders Placed This Encounter  Procedures  . Beta hCG quant (ref lab)  . HIV antibody  . RPR  . Hepatitis B surface antigen    Return in about 2 weeks (around 05/02/2017) for for BHCG (labs), then 1 yr for physical.  Cheral MarkerKimberly R Vannie Hochstetler CNM, [facility]NP-BC 04/18/2017 5:07 PM

## 2017-04-19 LAB — HIV ANTIBODY (ROUTINE TESTING W REFLEX): HIV SCREEN 4TH GENERATION: NONREACTIVE

## 2017-04-19 LAB — HEPATITIS B SURFACE ANTIGEN: HEP B S AG: NEGATIVE

## 2017-04-19 LAB — RPR: RPR: NONREACTIVE

## 2017-04-19 LAB — BETA HCG QUANT (REF LAB): hCG Quant: 381 m[IU]/mL

## 2017-05-02 ENCOUNTER — Other Ambulatory Visit: Payer: Medicaid Other

## 2017-05-02 DIAGNOSIS — O039 Complete or unspecified spontaneous abortion without complication: Secondary | ICD-10-CM

## 2017-05-03 ENCOUNTER — Ambulatory Visit (HOSPITAL_COMMUNITY)
Admission: EM | Admit: 2017-05-03 | Discharge: 2017-05-03 | Disposition: A | Discharge status: Home / Self Care | Payer: Medicaid Other | Attending: Family Medicine | Admitting: Family Medicine

## 2017-05-03 ENCOUNTER — Encounter (HOSPITAL_COMMUNITY): Payer: Self-pay | Admitting: Emergency Medicine

## 2017-05-03 DIAGNOSIS — M25572 Pain in left ankle and joints of left foot: Secondary | ICD-10-CM | POA: Diagnosis not present

## 2017-05-03 LAB — BETA HCG QUANT (REF LAB): hCG Quant: 8 m[IU]/mL

## 2017-05-03 MED ORDER — MELOXICAM 7.5 MG PO TABS: 7.5000 mg | ORAL_TABLET | Freq: Every day | ORAL | 0 refills | Status: DC

## 2017-05-03 MED ORDER — PREDNISONE 20 MG PO TABS: 40.0000 mg | ORAL_TABLET | Freq: Every day | ORAL | 0 refills | Status: AC

## 2017-05-03 NOTE — Discharge Instructions (Signed)
Start mobic and prednisone as directed for ankle pain. This will cover for gout attack as well. Ice compress, elevation to help with the symptoms. Avoid red meat and alcohol for now. Follow up for reevaluation if symptoms not improving, worsens.

## 2017-05-03 NOTE — ED Provider Notes (Signed)
[facility]    CSN: [id number] Arrival date & time: 05/03/17  1401     History   Chief Complaint Chief Complaint  Patient presents with  . Ankle Pain    HPI Nina ChurnKhadijah Phillips is a 27 y.o. female.   27 year old female with history of gout comes in for 2-day history of left ankle pain.  Denies injury/trauma.  States lateral side of the ankle more painful than medial.  Area is tender to light touch, similar to her gout attacks.  Has swelling to the ankle.  Denies erythema, fever, chills, night sweats.  Has been wearing knee brace without improvement.  States that had a burger the other day, and thinks red meat could have caused the symptoms to start.  Work requires long hours of standing.  Denies numbness, tingling.  Usually takes Aleve for gout attacks, has not been helping.     Past Medical History:  Diagnosis Date  . Gout     Patient Active Problem List   Diagnosis Date Noted  . Missed abortion 04/12/2017  . 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 WISDOM TOOTH EXTRACTION      OB History    Gravida  1   Para      Term      Preterm      AB  1   Living  0     SAB  1   TAB      Ectopic      Multiple      Live Births               Home Medications    Prior to Admission medications   Medication Sig Start Date End Date Taking? Authorizing Provider  HYDROcodone-acetaminophen (NORCO/VICODIN) 5-325 MG tablet Take 1-2 tablets by mouth every 6 (six) hours as needed for moderate pain or severe pain. Patient not taking: Reported on 04/18/2017 04/11/17   Cheral MarkerBooker, Kimberly R, CNM  meloxicam (MOBIC) 7.5 MG tablet Take 1 tablet (7.5 mg total) by mouth daily. 05/03/17   Cathie HoopsYu, Tahnee Cifuentes V, PA-C  misoprostol (CYTOTEC) 200 MCG tablet Take 4 tablets (800 mcg total) by mouth once for 1 dose. Repeat in 48 hours if needed 04/11/17 04/11/17  Cheral MarkerBooker, Kimberly R, CNM  predniSONE (DELTASONE) 20 MG tablet Take 2 tablets (40 mg total)  by mouth daily for 5 days. 05/03/17 05/08/17  Belinda FisherYu, Jenea Dake V, PA-C  Prenatal MV-Min-FA-Omega-3 (PRENATAL GUMMIES/DHA & FA PO) Take 2 tablets by mouth daily.    [provider]    Family History Family History  Problem Relation Age of Onset  . Cancer Other   . Hypertension Other   . Diabetes Other   . Hypertension Mother   . Gout Mother   . Diabetes Father   . Hypertension Maternal Grandfather   . Heart attack Maternal Grandfather   . Stroke Paternal Grandmother   . Hypertension Maternal Aunt   . Diabetes Paternal Grandfather     Social History Social History   Tobacco Use  . Smoking status: Never Smoker  . Smokeless tobacco: Never Used  Substance Use Topics  . Alcohol use: No    Frequency: Never    Comment: occas  . Drug use: No     Allergies   Other; Shellfish allergy; Bactrim [sulfamethoxazole-trimethoprim]; and Sulfa antibiotics   Review of Systems Review of Systems  Reason unable to perform ROS: See HPI as above.     Physical  Exam Triage Vital Signs ED Triage Vitals [05/03/17 1423]  Enc Vitals Group     BP (!) 154/89     Pulse Rate 82     Resp 18     Temp 98.3 F (36.8 C)     Temp src      SpO2 100 %     Weight      Height      Head Circumference      Peak Flow      Pain Score 10     Pain Loc      Pain Edu?      Excl. in GC?    No data found.  Updated Vital Signs BP (!) 154/89   Pulse 82   Temp 98.3 F (36.8 C)   Resp 18   SpO2 100%   Physical Exam  Constitutional: She is oriented to person, place, and time. She appears well-developed and well-nourished. No distress.  HENT:  Head: Normocephalic and atraumatic.  Eyes: Pupils are equal, round, and reactive to light. Conjunctivae are normal.  Musculoskeletal:  Swelling to the lateral left ankle. Patient very tender to light touch. Increased warmth without obvious erythema. Decreased ROM due to pain. Strength deferred. Sensation intact. Pedal pulse 2+, cap refill <2s  Neurological: She  is alert and oriented to person, place, and time.     UC Treatments / Results  Labs (all labs ordered are listed, but only abnormal results are displayed) Labs Reviewed - No data to display  EKG None Radiology No results found.  Procedures Procedures (including critical care time)  Medications Ordered in UC Medications - No data to display   Initial Impression / Assessment and Plan / UC Course  I have reviewed the triage vital signs and the nursing notes.  Pertinent labs & imaging results that were available during my care of the patient were reviewed by me and considered in my medical decision making (see chart for details).    27 year old female with atraumatic left ankle swelling and pain. Tender to light touch with increased warmth. Patient is afebrile. Will treat for possible gout flare up/inflammation. Mobic and prednisone as directed. Ice compress and elevation. Will provide crutches given pain. Return precautions given. Patient expresses understanding and agrees to plan.   Final Clinical Impressions(s) / UC Diagnoses   Final diagnoses:  Acute left ankle pain    ED Discharge Orders        Ordered    meloxicam (MOBIC) 7.5 MG tablet  Daily     05/03/17 1511    predniSONE (DELTASONE) 20 MG tablet  Daily     05/03/17 1511        Belinda Fisher, PA-C 05/03/17 1516

## 2017-05-03 NOTE — ED Triage Notes (Signed)
Pt c/o L ankle pain since yesterday, denies injury, states she is worried its gout, hx of gout in toes, never in ankle.

## 2017-05-06 ENCOUNTER — Encounter (HOSPITAL_COMMUNITY): Payer: Self-pay | Admitting: Family Medicine

## 2017-05-06 ENCOUNTER — Ambulatory Visit (HOSPITAL_COMMUNITY)
Admission: EM | Admit: 2017-05-06 | Discharge: 2017-05-06 | Disposition: A | Discharge status: Home / Self Care | Payer: Medicaid Other | Attending: Emergency Medicine | Admitting: Emergency Medicine

## 2017-05-06 DIAGNOSIS — M109 Gout, unspecified: Secondary | ICD-10-CM | POA: Diagnosis not present

## 2017-05-06 MED ORDER — COLCHICINE 0.6 MG PO TABS: 0.6000 mg | ORAL_TABLET | Freq: Every day | ORAL | 0 refills | Status: DC

## 2017-05-06 NOTE — ED Provider Notes (Signed)
[facility]    CSN: [id number] Arrival date & time: 05/06/17  1226     History   Chief Complaint Chief Complaint  Patient presents with  . Gout    HPI Nina Phillips is a 27 y.o. female presenting today with left ankle pain and concern for gout.  States that she is having a gout attack.  Has had gout since she was 27 years old.  States that she typically has relief with anti-inflammatories in a few days.  Was seen here on Tuesday and given Mobic and prednisone for attack.  States that her symptoms have slightly improved but are still persisting and having a lot of soreness.  HPI  Past Medical History:  Diagnosis Date  . Gout     Patient Active Problem List   Diagnosis Date Noted  . Missed abortion 04/12/2017  . 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 WISDOM TOOTH EXTRACTION      OB History    Gravida  1   Para      Term      Preterm      AB  1   Living  0     SAB  1   TAB      Ectopic      Multiple      Live Births               Home Medications    Prior to Admission medications   Medication Sig Start Date End Date Taking? Authorizing Provider  colchicine 0.6 MG tablet Take 1 tablet (0.6 mg total) by mouth daily for 2 days. 05/06/17 05/08/17  Ginny Loomer C, PA-C  HYDROcodone-acetaminophen (NORCO/VICODIN) 5-325 MG tablet Take 1-2 tablets by mouth every 6 (six) hours as needed for moderate pain or severe pain. Patient not taking: Reported on 04/18/2017 04/11/17   Cheral MarkerBooker, Kimberly R, CNM  meloxicam (MOBIC) 7.5 MG tablet Take 1 tablet (7.5 mg total) by mouth daily. 05/03/17   Cathie HoopsYu, Amy V, PA-C  misoprostol (CYTOTEC) 200 MCG tablet Take 4 tablets (800 mcg total) by mouth once for 1 dose. Repeat in 48 hours if needed 04/11/17 04/11/17  Cheral MarkerBooker, Kimberly R, CNM  predniSONE (DELTASONE) 20 MG tablet Take 2 tablets (40 mg total) by mouth daily for 5 days. 05/03/17 05/08/17  Belinda FisherYu, Amy V, PA-C  Prenatal  MV-Min-FA-Omega-3 (PRENATAL GUMMIES/DHA & FA PO) Take 2 tablets by mouth daily.    [provider]    Family History Family History  Problem Relation Age of Onset  . Cancer Other   . Hypertension Other   . Diabetes Other   . Hypertension Mother   . Gout Mother   . Diabetes Father   . Hypertension Maternal Grandfather   . Heart attack Maternal Grandfather   . Stroke Paternal Grandmother   . Hypertension Maternal Aunt   . Diabetes Paternal Grandfather     Social History Social History   Tobacco Use  . Smoking status: Never Smoker  . Smokeless tobacco: Never Used  Substance Use Topics  . Alcohol use: No    Frequency: Never    Comment: occas  . Drug use: No     Allergies   Other; Shellfish allergy; Bactrim [sulfamethoxazole-trimethoprim]; and Sulfa antibiotics   Review of Systems Review of Systems  Constitutional: Negative for fatigue and fever.  Respiratory: Negative for shortness of breath.   Cardiovascular: Negative for chest pain.  Gastrointestinal: Negative for abdominal  pain, diarrhea, nausea and vomiting.  Musculoskeletal: Positive for arthralgias, gait problem and joint swelling. Negative for back pain, myalgias, neck pain and neck stiffness.  Skin: Negative for color change, pallor and wound.  Neurological: Negative for dizziness, weakness, light-headedness and headaches.     Physical Exam Triage Vital Signs ED Triage Vitals  Enc Vitals Group     BP 05/06/17 1319 (!) 142/82     Pulse Rate 05/06/17 1319 81     Resp 05/06/17 1319 16     Temp 05/06/17 1319 98 F (36.7 C)     Temp Source 05/06/17 1319 Oral     SpO2 05/06/17 1319 99 %     Weight --      Height --      Head Circumference --      Peak Flow --      Pain Score 05/06/17 1321 8     Pain Loc --      Pain Edu? --      Excl. in GC? --    No data found.  Updated Vital Signs BP (!) 142/82 (BP Location: Right Arm) Comment: reported BP to Nurse Traci Bast  Pulse 81   Temp 98 F  (36.7 C) (Oral)   Resp 16   SpO2 99%   Visual Acuity Right Eye Distance:   Left Eye Distance:   Bilateral Distance:    Right Eye Near:   Left Eye Near:    Bilateral Near:     Physical Exam  Constitutional: She appears well-developed and well-nourished. No distress.  HENT:  Head: Normocephalic and atraumatic.  Eyes: Conjunctivae are normal.  Neck: Neck supple.  Cardiovascular: Normal rate and regular rhythm.  No murmur heard. Pulmonary/Chest: Effort normal and breath sounds normal. No respiratory distress.  Musculoskeletal: She exhibits no edema.  Mild swelling to left lateral malleolus, mild tenderness to palpation, minimal erythema overlying.  Dorsalis pedis 2+.  Patient has full range of motion of toes, distal sensation intact.  Neurological: She is alert.  Skin: Skin is warm and dry.  Psychiatric: She has a normal mood and affect.  Nursing note and vitals reviewed.    UC Treatments / Results  Labs (all labs ordered are listed, but only abnormal results are displayed) Labs Reviewed - No data to display  EKG None Radiology No results found.  Procedures Procedures (including critical care time)  Medications Ordered in UC Medications - No data to display   Initial Impression / Assessment and Plan / UC Course  I have reviewed the triage vital signs and the nursing notes.  Pertinent labs & imaging results that were available during my care of the patient were reviewed by me and considered in my medical decision making (see chart for details).     Patient presenting with acute gout flare that is not improving with anti-inflammatories and prednisone.  We will give the begin patient on colchicine.  Patient denies any issues with her kidney or liver.  Is not on any other medications.  Advised that this medicine does cause some diarrhea. Discussed strict return precautions. Patient verbalized understanding and is agreeable with plan.   Final Clinical Impressions(s)  / UC Diagnoses   Final diagnoses:  Acute gout of left ankle, unspecified cause    ED Discharge Orders        Ordered    colchicine 0.6 MG tablet  Daily     05/06/17 1354       Controlled Substance Prescriptions [place] Controlled Substance  Registry consulted? Not Applicable   Lew Dawes, [place] 05/06/17 1402

## 2017-05-06 NOTE — ED Triage Notes (Signed)
Pt here for continued pain in the left foot. Was seen here earlier this week and dx and treated with Gout. She is not better despite the meds and unable to work.

## 2017-05-06 NOTE — Discharge Instructions (Signed)
Colchicine: Please take 2 tablets today, 1 hour later take another tablet.  Repeat tomorrow.  Please continue to take anti-inflammatories.  Please follow-up if symptoms not improving with medication.

## 2017-11-01 ENCOUNTER — Encounter (HOSPITAL_COMMUNITY): Payer: Self-pay

## 2017-11-01 ENCOUNTER — Inpatient Hospital Stay (HOSPITAL_COMMUNITY)
Admission: AD | Admit: 2017-11-01 | Discharge: 2017-11-01 | Disposition: A | Discharge status: Home / Self Care | Payer: 59 | Source: Ambulatory Visit | Attending: Obstetrics & Gynecology | Admitting: Obstetrics & Gynecology

## 2017-11-01 ENCOUNTER — Inpatient Hospital Stay (HOSPITAL_COMMUNITY): Payer: 59

## 2017-11-01 DIAGNOSIS — Q513 Bicornate uterus: Secondary | ICD-10-CM

## 2017-11-01 DIAGNOSIS — O99891 Other specified diseases and conditions complicating pregnancy: Secondary | ICD-10-CM

## 2017-11-01 DIAGNOSIS — Z3A01 Less than 8 weeks gestation of pregnancy: Secondary | ICD-10-CM | POA: Diagnosis not present

## 2017-11-01 DIAGNOSIS — B9689 Other specified bacterial agents as the cause of diseases classified elsewhere: Secondary | ICD-10-CM | POA: Diagnosis present

## 2017-11-01 DIAGNOSIS — O3680X0 Pregnancy with inconclusive fetal viability, not applicable or unspecified: Secondary | ICD-10-CM

## 2017-11-01 DIAGNOSIS — O9989 Other specified diseases and conditions complicating pregnancy, childbirth and the puerperium: Secondary | ICD-10-CM | POA: Diagnosis not present

## 2017-11-01 DIAGNOSIS — O26891 Other specified pregnancy related conditions, first trimester: Secondary | ICD-10-CM | POA: Diagnosis not present

## 2017-11-01 DIAGNOSIS — B009 Herpesviral infection, unspecified: Secondary | ICD-10-CM

## 2017-11-01 DIAGNOSIS — N96 Recurrent pregnancy loss: Secondary | ICD-10-CM | POA: Diagnosis present

## 2017-11-01 DIAGNOSIS — N760 Acute vaginitis: Secondary | ICD-10-CM | POA: Insufficient documentation

## 2017-11-01 DIAGNOSIS — M549 Dorsalgia, unspecified: Secondary | ICD-10-CM | POA: Diagnosis not present

## 2017-11-01 DIAGNOSIS — M545 Low back pain: Secondary | ICD-10-CM | POA: Insufficient documentation

## 2017-11-01 LAB — URINALYSIS, ROUTINE W REFLEX MICROSCOPIC
Bilirubin Urine: NEGATIVE
GLUCOSE, UA: NEGATIVE mg/dL
Hgb urine dipstick: NEGATIVE
KETONES UR: NEGATIVE mg/dL
LEUKOCYTES UA: NEGATIVE
NITRITE: NEGATIVE
PH: 6 (ref 5.0–8.0)
Protein, ur: 100 mg/dL — AB
Specific Gravity, Urine: 1.025 (ref 1.005–1.030)

## 2017-11-01 LAB — CBC
HCT: 31.1 % — ABNORMAL LOW (ref 36.0–46.0)
Hemoglobin: 9.8 g/dL — ABNORMAL LOW (ref 12.0–15.0)
MCH: 22.3 pg — AB (ref 26.0–34.0)
MCHC: 31.5 g/dL (ref 30.0–36.0)
MCV: 70.7 fL — AB (ref 80.0–100.0)
NRBC: 0 % (ref 0.0–0.2)
PLATELETS: 412 10*3/uL — AB (ref 150–400)
RBC: 4.4 MIL/uL (ref 3.87–5.11)
RDW: 17.5 % — AB (ref 11.5–15.5)
WBC: 10.1 10*3/uL (ref 4.0–10.5)

## 2017-11-01 LAB — URINALYSIS, MICROSCOPIC (REFLEX)
Bacteria, UA: NONE SEEN
RBC / HPF: NONE SEEN RBC/hpf (ref 0–5)

## 2017-11-01 LAB — POCT PREGNANCY, URINE: PREG TEST UR: POSITIVE — AB

## 2017-11-01 LAB — WET PREP, GENITAL
SPERM: NONE SEEN
Trich, Wet Prep: NONE SEEN
Yeast Wet Prep HPF POC: NONE SEEN

## 2017-11-01 LAB — ABO/RH: ABO/RH(D): O POS

## 2017-11-01 LAB — HCG, QUANTITATIVE, PREGNANCY: hCG, Beta Chain, Quant, S: 1060 m[IU]/mL — ABNORMAL HIGH (ref <5)

## 2017-11-01 MED ORDER — METOCLOPRAMIDE HCL 10 MG PO TABS: 10.0000 mg | ORAL_TABLET | Freq: Four times a day (QID) | ORAL | 0 refills | Status: DC

## 2017-11-01 MED ORDER — LIDOCAINE HCL URETHRAL/MUCOSAL 2 % EX GEL
1.0000 "application " | Freq: Once | CUTANEOUS | Status: AC
  Administered 2017-11-01: 1 via TOPICAL
  Filled 2017-11-01: qty 5

## 2017-11-01 MED ORDER — CYCLOBENZAPRINE HCL 10 MG PO TABS
10.0000 mg | ORAL_TABLET | Freq: Once | ORAL | Status: AC
  Administered 2017-11-01: 10 mg via ORAL
  Filled 2017-11-01: qty 1

## 2017-11-01 MED ORDER — PROMETHAZINE HCL 25 MG RE SUPP: 25.0000 mg | Freq: Four times a day (QID) | RECTAL | 0 refills | Status: DC | PRN

## 2017-11-01 MED ORDER — METRONIDAZOLE 500 MG PO TABS: 500.0000 mg | ORAL_TABLET | Freq: Two times a day (BID) | ORAL | 0 refills | Status: AC

## 2017-11-01 MED ORDER — RANITIDINE HCL 150 MG PO TABS: 150.0000 mg | ORAL_TABLET | Freq: Two times a day (BID) | ORAL | 0 refills | Status: DC

## 2017-11-01 NOTE — MAU Provider Note (Signed)
History     CSN: [id number]  Arrival date and time: 11/01/17 1300   First Provider Initiated Contact with Patient 11/01/17 1432      Chief Complaint  Patient presents with  . Back Pain   HPI  Ms.  Nina Phillips is a 27 y.o. year old G82P0010 female at [redacted]w[redacted]d weeks gestation who presents to MAU reporting constant, sharp pain in her lower back while at work today. She reports the pain radiates up into her shoulder blade. She rates the pain a 9/10. She denies any vaginal d/c, VB, LOF, or N/V/D.   Past Medical History:  Diagnosis Date  . Gout     Past Surgical History:  Procedure Laterality Date  . WISDOM TOOTH EXTRACTION      Family History  Problem Relation Age of Onset  . Cancer Other   . Hypertension Other   . Diabetes Other   . Hypertension Mother   . Gout Mother   . Diabetes Father   . Hypertension Maternal Grandfather   . Heart attack Maternal Grandfather   . Stroke Paternal Grandmother   . Hypertension Maternal Aunt   . Diabetes Paternal Grandfather     Social History   Tobacco Use  . Smoking status: Never Smoker  . Smokeless tobacco: Never Used  Substance Use Topics  . Alcohol use: No    Frequency: Never    Comment: occas  . Drug use: No    Allergies:  Allergies  Allergen Reactions  . Other Hives    Walnuts, tomatoes, bananas, watermelon, apples    . Shellfish Allergy Hives  . Bactrim [Sulfamethoxazole-Trimethoprim] Hives  . Sulfa Antibiotics Rash    Medications Prior to Admission  Medication Sig Dispense Refill Last Dose  . Prenatal Vit-Fe Fumarate-FA (PRENATAL MULTIVITAMIN) TABS tablet Take 1 tablet by mouth daily at 12 noon.   11/01/2017 at Unknown time  . meloxicam (MOBIC) 7.5 MG tablet Take 1 tablet (7.5 mg total) by mouth daily. (Patient not taking: Reported on 11/01/2017) 15 tablet 0 Not Taking at Unknown time  . [DISCONTINUED] colchicine 0.6 MG tablet Take 1 tablet (0.6 mg total) by mouth daily for 2 days. 6 tablet 0   . [DISCONTINUED]  HYDROcodone-acetaminophen (NORCO/VICODIN) 5-325 MG tablet Take 1-2 tablets by mouth every 6 (six) hours as needed for moderate pain or severe pain. (Patient not taking: Reported on 04/18/2017) 10 tablet 0 Not Taking at Unknown time  . [DISCONTINUED] misoprostol (CYTOTEC) 200 MCG tablet Take 4 tablets (800 mcg total) by mouth once for 1 dose. Repeat in 48 hours if needed 4 tablet 1   . [DISCONTINUED] Prenatal MV-Min-FA-Omega-3 (PRENATAL GUMMIES/DHA & FA PO) Take 2 tablets by mouth daily.   Taking    Review of Systems  Constitutional: Negative.   HENT: Negative.   Eyes: Negative.   Respiratory: Negative.   Cardiovascular: Negative.   Gastrointestinal: Negative.   Endocrine: Negative.   Musculoskeletal: Positive for back pain (that radiates to mid back).  Skin: Negative.   Allergic/Immunologic: Negative.   Neurological: Negative.   Hematological: Negative.   Psychiatric/Behavioral: Negative.    Physical Exam   Blood pressure 139/86, pulse 78, temperature 97.9 F (36.6 C), temperature source Oral, resp. rate 20, weight 110.2 kg, last menstrual period 09/19/2017.  Physical Exam  Nursing note and vitals reviewed. Constitutional: She is oriented to person, place, and time. She appears well-developed and well-nourished.  HENT:  Head: Normocephalic and atraumatic.  Eyes: Pupils are equal, round, and reactive to light.  Neck:  Normal range of motion.  Cardiovascular: Normal rate.  Respiratory: Effort normal.  GI: Soft.  Genitourinary:  Genitourinary Comments: Uterus: non-tender, SE: cervix is smooth, pink, no lesions, small amt of thick, white vaginal d/c -- WP, GC/CT done, closed/long/firm, no CMT or friability, no adnexal tenderness   Musculoskeletal: Normal range of motion.  Neurological: She is alert and oriented to person, place, and time.  Skin: Skin is warm and dry.  Psychiatric: She has a normal mood and affect. Her behavior is normal. Judgment and thought content normal.     MAU Course  Procedures  MDM CCUA UPT CBC ABO/Rh HCG Wet Prep GC/CT -- pending HIV -- pending OB < 14 wks [name] with TV  Results for orders placed or performed during the hospital encounter of 11/01/17 (from the past 24 hour(s))  Urinalysis, Routine w reflex microscopic     Status: Abnormal   Collection Time: 11/01/17  1:03 PM  Result Value Ref Range   Color, Urine YELLOW YELLOW   APPearance HAZY (A) CLEAR   Specific Gravity, Urine 1.025 1.005 - 1.030   pH 6.0 5.0 - 8.0   Glucose, UA NEGATIVE NEGATIVE mg/dL   Hgb urine dipstick NEGATIVE NEGATIVE   Bilirubin Urine NEGATIVE NEGATIVE   Ketones, ur NEGATIVE NEGATIVE mg/dL   Protein, ur 161 (A) NEGATIVE mg/dL   Nitrite NEGATIVE NEGATIVE   Leukocytes, UA NEGATIVE NEGATIVE  Urinalysis, Microscopic (reflex)     Status: None   Collection Time: 11/01/17  1:03 PM  Result Value Ref Range   RBC / HPF NONE SEEN 0 - 5 RBC/hpf   WBC, UA 0-5 0 - 5 WBC/hpf   Bacteria, UA NONE SEEN NONE SEEN   Squamous Epithelial / LPF 11-20 0 - 5  Pregnancy, urine POC     Status: Abnormal   Collection Time: 11/01/17  1:38 PM  Result Value Ref Range   Preg Test, Ur POSITIVE (A) NEGATIVE  Wet prep, genital     Status: Abnormal   Collection Time: 11/01/17  2:51 PM  Result Value Ref Range   Yeast Wet Prep HPF POC NONE SEEN NONE SEEN   Trich, Wet Prep NONE SEEN NONE SEEN   Clue Cells Wet Prep HPF POC PRESENT (A) NONE SEEN   WBC, Wet Prep HPF POC FEW (A) NONE SEEN   Sperm NONE SEEN   CBC     Status: Abnormal   Collection Time: 11/01/17  2:59 PM  Result Value Ref Range   WBC 10.1 4.0 - 10.5 K/uL   RBC 4.40 3.87 - 5.11 MIL/uL   Hemoglobin 9.8 (L) 12.0 - 15.0 g/dL   HCT 09.6 (L) 04.5 - 40.9 %   MCV 70.7 (L) 80.0 - 100.0 fL   MCH 22.3 (L) 26.0 - 34.0 pg   MCHC 31.5 30.0 - 36.0 g/dL   RDW 81.1 (H) 91.4 - 78.2 %   Platelets 412 (H) 150 - 400 K/uL   nRBC 0.0 0.0 - 0.2 %  ABO/Rh     Status: None   Collection Time: 11/01/17  2:59 PM  Result Value Ref  Range   ABO/RH(D)      O POS Performed at [facility], [address], [place], [place] [address]   hCG, quantitative, pregnancy     Status: Abnormal   Collection Time: 11/01/17  2:59 PM  Result Value Ref Range   hCG, Beta Chain, Quant, S 1,060 (H) <5 mIU/mL    US Ob Less Than 14 Weeks With Ob Transvaginal  Result Date: 11/01/2017 CLINICAL DATA:  Back pain and first-trimester pregnancy EXAM: OBSTETRIC <14 WK [name] AND TRANSVAGINAL OB US TECHNIQUE: Both transabdominal and transvaginal ultrasound examinations were performed for complete evaluation of the gestation as well as the maternal uterus, adnexal regions, and pelvic cul-de-sac. Transvaginal technique was performed to assess early pregnancy. COMPARISON:  04/11/2017 FINDINGS: Intrauterine gestational sac: Presumed single Yolk sac:  Not Visualized. Embryo:  Not Visualized. MSD: 3.7 mm   5 w   0 d Subchorionic hemorrhage:  None visualized. Maternal uterus/adnexae: Bicornuate uterus. Questionable small subserosal fibroid measuring up to 13 mm. Corpus luteum on the right. IMPRESSION: 1. Probable early intrauterine gestational sac (5 weeks), but no yolk sac or fetal pole. Recommend follow-up quantitative B-HCG levels and follow-up US in 14 days to assess viability. This recommendation follows SRU consensus guidelines: Diagnostic Criteria for Nonviable Pregnancy Early in the First Trimester. Malva Limes Med 2013; 161:0960-45. 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 Electronically Signed   By: Marnee Spring M.D.   On: 11/01/2017 19:12     Assessment and Plan  Back pain in pregnancy   Bacterial vaginitis - Rx for Flagyl 500 mg BID x 7 days - Information provided on BV   Pregnancy with uncertain fetal viability, single or unspecified fetus - Repeat HCG with [facility] on Thursday 11/03/17 - Information provided on ectopic pregnancy  - Discharge home - Someone from [facility] will call you to schedule appt for rpt HCG -  Patient verbalized an understanding of the plan of care and agrees.    Raelyn Mora, MSN, CNM 11/01/2017, 2:32 PM

## 2017-11-01 NOTE — MAU Note (Addendum)
Pt is a G2P0 at 6.1 weeks c/o constant back pain that originates in lower back and moved under shoulder blade.  Pt reports 9/10 pain. Pain appears to be well controlled, pt is talking and able to maintain composure.

## 2017-11-01 NOTE — Discharge Instructions (Signed)
[facility] Prenatal Care Providers °         °[facility]  ° Phone: [phone] ° °[facility] @ [facility]  ° Phone: [phone] ° °[facility]      ° Phone: [phone]   °         °[facility] @ [place]    ° Phone: [phone] °         °[facility] @ [place]  ° Phone: [phone] ° °[facility] @ [facility] ° Phone: [phone] °    °[facility] ([place]) ° Phone: [phone] °Safe Medications in Pregnancy  ° °Acne: °Benzoyl Peroxide °Salicylic Acid ° °Backache/Headache: °Tylenol: 2 regular strength every 4 hours OR °             2 Extra strength every 6 hours ° °Colds/Coughs/Allergies: °Benadryl (alcohol free) 25 mg every 6 hours as needed °Breath right strips °Claritin °Cepacol throat lozenges °Chloraseptic throat spray °Cold-Eeze- up to three times per day °Cough drops, alcohol free °Flonase (by prescription only) °Guaifenesin °Mucinex °Robitussin DM (plain only, alcohol free) °Saline nasal spray/drops °Sudafed (pseudoephedrine) & Actifed ** use only after [redacted] weeks gestation and if you do not have high blood pressure °Tylenol °Vicks Vaporub °Zinc lozenges °Zyrtec  ° °Constipation: °Colace °Ducolax suppositories °Fleet enema °Glycerin suppositories °Metamucil °Milk of magnesia °Miralax °Senokot °Smooth move tea ° °Diarrhea: °Kaopectate °Imodium A-D ° °*NO pepto Bismol ° °Hemorrhoids: °Anusol °Anusol HC °Preparation H °Tucks ° °Indigestion: °Tums °Maalox °Mylanta °Zantac  °Pepcid ° °Insomnia: °Benadryl (alcohol free) 25mg every 6 hours as needed °Tylenol PM °Unisom, no Gelcaps ° °Leg Cramps: °Tums °MagGel ° °Nausea/Vomiting:  °Bonine °Dramamine °Emetrol °Ginger extract °Sea bands °Meclizine  °Nausea medication to take during pregnancy:  °Unisom (doxylamine succinate 25 mg tablets) Take one tablet daily at bedtime. If symptoms are not adequately controlled, the dose  can be increased to a maximum recommended dose of two tablets daily (1/2 tablet in the morning, 1/2 tablet mid-afternoon and one at bedtime). °Vitamin B6 100mg tablets. Take one tablet twice a day (up to 200 mg per day). ° °Skin Rashes: °Aveeno products °Benadryl cream or 25mg every 6 hours as needed °Calamine Lotion °1% cortisone cream ° °Yeast infection: °Gyne-lotrimin 7 °Monistat 7 ° ° °**If taking multiple medications, please check labels to avoid duplicating the same active ingredients °**take medication as directed on the label °** Do not exceed 4000 mg of tylenol in 24 hours °**Do not take medications that contain aspirin or ibuprofen ° ° ° ° °

## 2017-11-01 NOTE — MAU Note (Signed)
Sharp back pain since today while at work  No bleeding, no abdominal pain  +HPT last week

## 2017-11-02 ENCOUNTER — Telehealth: Payer: Self-pay | Admitting: General Practice

## 2017-11-02 LAB — GC/CHLAMYDIA PROBE AMP (~~LOC~~) NOT AT ARMC
Chlamydia: NEGATIVE
Neisseria Gonorrhea: NEGATIVE

## 2017-11-02 NOTE — Telephone Encounter (Signed)
Patient called office to schedule follow up lab visit.

## 2017-11-02 NOTE — Telephone Encounter (Signed)
Left message on VM for patient to give our office a call to schedule Lab visit for repeat HCG per Raelyn Mora, CNM.

## 2017-11-03 ENCOUNTER — Other Ambulatory Visit: Payer: 59

## 2017-11-03 DIAGNOSIS — O039 Complete or unspecified spontaneous abortion without complication: Secondary | ICD-10-CM

## 2017-11-03 NOTE — Progress Notes (Unsigned)
Patient is here for lab work only - beta hCG.

## 2017-11-04 ENCOUNTER — Telehealth: Payer: Self-pay | Admitting: Obstetrics and Gynecology

## 2017-11-04 LAB — BETA HCG QUANT (REF LAB): hCG Quant: 880 m[IU]/mL

## 2017-11-04 NOTE — Telephone Encounter (Signed)
TC to discuss HCG results. Advised the level is 880; which is down from 1,060. Informed that the level would have to drop below 50% in order to diagnose miscarriage. Discussed the plan to return to office in 1 week for repeat HCG.  Raelyn Mora, CNM

## 2017-11-07 ENCOUNTER — Other Ambulatory Visit: Payer: Self-pay

## 2017-11-07 ENCOUNTER — Inpatient Hospital Stay (HOSPITAL_COMMUNITY)
Admission: AD | Admit: 2017-11-07 | Discharge: 2017-11-07 | Disposition: A | Discharge status: Home / Self Care | Payer: 59 | Source: Ambulatory Visit | Attending: Obstetrics and Gynecology | Admitting: Obstetrics and Gynecology

## 2017-11-07 ENCOUNTER — Encounter (HOSPITAL_COMMUNITY): Payer: Self-pay

## 2017-11-07 ENCOUNTER — Telehealth: Payer: Self-pay | Admitting: *Deleted

## 2017-11-07 ENCOUNTER — Inpatient Hospital Stay (HOSPITAL_COMMUNITY): Payer: 59

## 2017-11-07 DIAGNOSIS — O200 Threatened abortion: Secondary | ICD-10-CM | POA: Diagnosis not present

## 2017-11-07 DIAGNOSIS — O209 Hemorrhage in early pregnancy, unspecified: Secondary | ICD-10-CM | POA: Diagnosis present

## 2017-11-07 DIAGNOSIS — R079 Chest pain, unspecified: Secondary | ICD-10-CM | POA: Diagnosis not present

## 2017-11-07 DIAGNOSIS — O26899 Other specified pregnancy related conditions, unspecified trimester: Secondary | ICD-10-CM

## 2017-11-07 DIAGNOSIS — O26891 Other specified pregnancy related conditions, first trimester: Secondary | ICD-10-CM | POA: Diagnosis not present

## 2017-11-07 DIAGNOSIS — Z3A01 Less than 8 weeks gestation of pregnancy: Secondary | ICD-10-CM | POA: Insufficient documentation

## 2017-11-07 DIAGNOSIS — Q513 Bicornate uterus: Secondary | ICD-10-CM

## 2017-11-07 DIAGNOSIS — R109 Unspecified abdominal pain: Secondary | ICD-10-CM

## 2017-11-07 LAB — CBC WITH DIFFERENTIAL/PLATELET
Basophils Absolute: 0.1 10*3/uL (ref 0.0–0.1)
Basophils Relative: 1 %
Eosinophils Absolute: 0.6 10*3/uL — ABNORMAL HIGH (ref 0.0–0.5)
Eosinophils Relative: 5 %
HEMATOCRIT: 33.9 % — AB (ref 36.0–46.0)
HEMOGLOBIN: 10.6 g/dL — AB (ref 12.0–15.0)
Lymphocytes Relative: 37 %
Lymphs Abs: 4.4 10*3/uL — ABNORMAL HIGH (ref 0.7–4.0)
MCH: 22.1 pg — ABNORMAL LOW (ref 26.0–34.0)
MCHC: 31.3 g/dL (ref 30.0–36.0)
MCV: 70.6 fL — AB (ref 80.0–100.0)
MONO ABS: 0.5 10*3/uL (ref 0.1–1.0)
MONOS PCT: 4 %
NEUTROS ABS: 6.3 10*3/uL (ref 1.7–7.7)
NEUTROS PCT: 53 %
Platelets: 408 10*3/uL — ABNORMAL HIGH (ref 150–400)
RBC: 4.8 MIL/uL (ref 3.87–5.11)
RDW: 17.3 % — AB (ref 11.5–15.5)
WBC: 11.8 10*3/uL — AB (ref 4.0–10.5)
nRBC: 0 % (ref 0.0–0.2)

## 2017-11-07 LAB — URINALYSIS, ROUTINE W REFLEX MICROSCOPIC

## 2017-11-07 LAB — URINALYSIS, MICROSCOPIC (REFLEX): RBC / HPF: >50 RBC/hpf (ref 0–5)

## 2017-11-07 LAB — HCG, QUANTITATIVE, PREGNANCY: hCG, Beta Chain, Quant, S: 225 m[IU]/mL — ABNORMAL HIGH (ref <5)

## 2017-11-07 MED ORDER — HYDROMORPHONE HCL 1 MG/ML IJ SOLN
1.0000 mg | Freq: Once | INTRAMUSCULAR | Status: AC
  Administered 2017-11-07: 1 mg via INTRAMUSCULAR
  Filled 2017-11-07: qty 1

## 2017-11-07 MED ORDER — OXYCODONE-ACETAMINOPHEN 5-325 MG PO TABS: 1.0000 | ORAL_TABLET | Freq: Four times a day (QID) | ORAL | 0 refills | Status: DC | PRN

## 2017-11-07 MED ORDER — OXYCODONE-ACETAMINOPHEN 5-325 MG PO TABS
1.0000 | ORAL_TABLET | Freq: Once | ORAL | Status: AC
  Administered 2017-11-07: 1 via ORAL
  Filled 2017-11-07: qty 1

## 2017-11-07 NOTE — MAU Note (Signed)
Pt presents to MAU stating that her pregnancy is being followed with BHCG due to a decrease in her levels. Pt is experiencing an increase in her vaginal since she was here last week with lower abdominal cramping

## 2017-11-07 NOTE — MAU Provider Note (Signed)
History     CSN: [id number]  Arrival date and time: 11/07/17 1423   None     Chief Complaint  Patient presents with  . Vaginal Bleeding   HPI Nina Phillips is 27 y.o. G2P0010 [redacted]w[redacted]d weeks presenting with vaginal bleeding and increased pain since her visit here on 10/8. Seen for sharp constant lower back pain.  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  BHCG 1,060 and repeat in 48 hrs was 850. Blood type O positive.  She states bleeding with clots began and now pain is sharp and constant.  Rating pain 9/10.  She states she has not taken anything for the pain.   Hx of 1 miscarriage.  Delivered last with [facility], last seen 03/2017.    Past Medical History:  Diagnosis Date  . Gout     Past Surgical History:  Procedure Laterality Date  . WISDOM TOOTH EXTRACTION      Family History  Problem Relation Age of Onset  . Cancer Other   . Hypertension Other   . Diabetes Other   . Hypertension Mother   . Gout Mother   . Diabetes Father   . Hypertension Maternal Grandfather   . Heart attack Maternal Grandfather   . Stroke Paternal Grandmother   . Hypertension Maternal Aunt   . Diabetes Paternal Grandfather     Social History   Tobacco Use  . Smoking status: Never Smoker  . Smokeless tobacco: Never Used  Substance Use Topics  . Alcohol use: No    Frequency: Never    Comment: occas  . Drug use: No    Allergies:  Allergies  Allergen Reactions  . Other Hives    Walnuts, tomatoes, bananas, watermelon, apples    . Shellfish Allergy Hives  . Bactrim [Sulfamethoxazole-Trimethoprim] Hives  . Sulfa Antibiotics Rash    Medications Prior to Admission  Medication Sig Dispense Refill Last Dose  . metoCLOPramide (REGLAN) 10 MG tablet Take 1 tablet (10 mg total) by mouth every 6 (six) hours. 30 tablet 0   . metroNIDAZOLE (FLAGYL) 500 MG tablet Take 1 tablet (500 mg total) by mouth 2 (two)  times daily for 7 days. 14 tablet 0   . promethazine (PHENERGAN) 25 MG suppository Place 1 suppository (25 mg total) rectally every 6 (six) hours as needed for nausea or vomiting. USE VAGINALLY at bedtime (Patient not taking: Reported on 11/07/2017) 12 each 0 Not Taking at Unknown time  . ranitidine (ZANTAC) 150 MG tablet Take 1 tablet (150 mg total) by mouth 2 (two) times daily. (Patient not taking: Reported on 11/07/2017) 60 tablet 0 Not Taking at Unknown time    Review of Systems  Constitutional: Negative for appetite change, fatigue and fever.  Respiratory: Negative for shortness of breath.   Cardiovascular: Negative for chest pain.  Gastrointestinal: Positive for abdominal pain (lower abdominal pain/cramping).  Genitourinary: Positive for pelvic pain and vaginal bleeding.  Neurological: Negative for dizziness.  Psychiatric/Behavioral: Negative for behavioral problems and confusion.   Physical Exam   Blood pressure (!) 119/51, pulse 87, temperature 98.3 F (36.8 C), resp. rate 16, last menstrual period 09/19/2017, SpO2 100 %.  Physical Exam  Constitutional: She is oriented to person, place, and time. She appears well-developed and well-nourished. No distress.  Uncomfortable, teary  HENT:  Head: Normocephalic.  Neck: Normal range of motion.  Cardiovascular: Normal rate.  Respiratory: Effort normal.  GI: Soft. She  exhibits no distension and no mass. There is no tenderness. There is no rebound and no guarding.  Genitourinary: There is no rash, tenderness or lesion on the right labia. There is no rash, tenderness or lesion on the left labia. Uterus is tender. Uterus is not enlarged. Cervix exhibits no motion tenderness, no discharge and no friability. Right adnexum displays tenderness (mild tenderness bilaterally). Right adnexum displays no mass and no fullness. Left adnexum displays tenderness. Left adnexum displays no mass and no fullness. There is bleeding (small amount of stringy,  bright red bleeding with clots.  ) in the vagina. No tenderness in the vagina.  Neurological: She is alert and oriented to person, place, and time.  Skin: Skin is warm and dry.  Psychiatric: She has a normal mood and affect. Her behavior is normal. Thought content normal.   Patient called out with chest pain- Lungs clear to A&P                                                         Heart RRR with murmur/gallop Results for orders placed or performed during the hospital encounter of 11/07/17 (from the past 24 hour(s))  Urinalysis, Routine w reflex microscopic     Status: Abnormal   Collection Time: 11/07/17  4:28 PM  Result Value Ref Range   Color, Urine RED (A) YELLOW   APPearance TURBID (A) CLEAR   Specific Gravity, Urine  1.005 - 1.030    TEST NOT REPORTED DUE TO COLOR INTERFERENCE OF URINE PIGMENT   pH  5.0 - 8.0    TEST NOT REPORTED DUE TO COLOR INTERFERENCE OF URINE PIGMENT   Glucose, UA (A) NEGATIVE mg/dL    TEST NOT REPORTED DUE TO COLOR INTERFERENCE OF URINE PIGMENT   Hgb urine dipstick (A) NEGATIVE    TEST NOT REPORTED DUE TO COLOR INTERFERENCE OF URINE PIGMENT   Bilirubin Urine (A) NEGATIVE    TEST NOT REPORTED DUE TO COLOR INTERFERENCE OF URINE PIGMENT   Ketones, ur (A) NEGATIVE mg/dL    TEST NOT REPORTED DUE TO COLOR INTERFERENCE OF URINE PIGMENT   Protein, ur (A) NEGATIVE mg/dL    TEST NOT REPORTED DUE TO COLOR INTERFERENCE OF URINE PIGMENT   Nitrite (A) NEGATIVE    TEST NOT REPORTED DUE TO COLOR INTERFERENCE OF URINE PIGMENT   Leukocytes, UA (A) NEGATIVE    TEST NOT REPORTED DUE TO COLOR INTERFERENCE OF URINE PIGMENT  Urinalysis, Microscopic (reflex)     Status: Abnormal   Collection Time: 11/07/17  4:28 PM  Result Value Ref Range   RBC / HPF >50 0 - 5 RBC/hpf   WBC, UA 0-5 0 - 5 WBC/hpf   Bacteria, UA FEW (A) NONE SEEN   Squamous Epithelial / LPF 0-5 0 - 5   Urine-Other MICROSCOPIC EXAM PERFORMED ON UNCONCENTRATED URINE   CBC with Differential/Platelet      Status: Abnormal   Collection Time: 11/07/17  5:11 PM  Result Value Ref Range   WBC 11.8 (H) 4.0 - 10.5 K/uL   RBC 4.80 3.87 - 5.11 MIL/uL   Hemoglobin 10.6 (L) 12.0 - 15.0 g/dL   HCT 16.1 (L) 09.6 - 04.5 %   MCV 70.6 (L) 80.0 - 100.0 fL   MCH 22.1 (L) 26.0 - 34.0 pg   MCHC 31.3 30.0 -  36.0 g/dL   RDW 16.1 (H) 09.6 - 04.5 %   Platelets 408 (H) 150 - 400 K/uL   nRBC 0.0 0.0 - 0.2 %   Neutrophils Relative % 53 %   Neutro Abs 6.3 1.7 - 7.7 K/uL   Lymphocytes Relative 37 %   Lymphs Abs 4.4 (H) 0.7 - 4.0 K/uL   Monocytes Relative 4 %   Monocytes Absolute 0.5 0.1 - 1.0 K/uL   Eosinophils Relative 5 %   Eosinophils Absolute 0.6 (H) 0.0 - 0.5 K/uL   Basophils Relative 1 %   Basophils Absolute 0.1 0.0 - 0.1 K/uL  hCG, quantitative, pregnancy     Status: Abnormal   Collection Time: 11/07/17  5:11 PM  Result Value Ref Range   hCG, Beta Chain, Quant, S 225 (H) <5 mIU/mL   US Ob Transvaginal  Result Date: 11/07/2017 CLINICAL DATA:  Pain with vaginal bleeding for 5 days EXAM: TRANSVAGINAL OB ULTRASOUND TECHNIQUE: Transvaginal ultrasound was performed for complete evaluation of the gestation as well as the maternal uterus, adnexal regions, and pelvic cul-de-sac. COMPARISON:  11/01/2017 FINDINGS: Intrauterine gestational sac: Visible in the right horn of the uterus Yolk sac:  Visible Embryo:  Likely visualized Cardiac Activity: Not seen CRL: 2.4 mm   5 w 5 d                  [name] EDC: 07/05/2018 Subchorionic hemorrhage:  None visualized. Maternal uterus/adnexae: Ovaries are within normal limits. Left ovary measures 1.5 x 2.4 x 1.4 cm. Right ovary measures 2.7 x 1.6 x 2.2 cm. 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 No significant free fluid. IMPRESSION: 1. 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 IUP is present within the right horn. 2. Visible intrauterine gestational sac in the right horn with yolk sac now visible and probable small embryo. Average  crown-rump length is 2.4 mm, no fetal cardiac activity identified. Findings are suspicious but not yet definitive for failed pregnancy. Recommend follow-up US in 10-14 days for definitive diagnosis. This recommendation follows SRU consensus guidelines: Diagnostic Criteria for Nonviable Pregnancy Early in the First Trimester. Malva Limes Med 2013; 409:8119-14. Electronically Signed   By: Jasmine Pang M.D.   On: 11/07/2017 19:05    MAU Course  Procedures  MDM MSE Exam Labs Pain medication -Percocet 5 mg po given in MAU EKG ordered at 16:42 after patient called out and reported chest pain.  In to see patient.   Report Normal Sinus Rhythm 17:20  RN  Reports patient is still uncomfortable and has not gotten any relief from Percocet/  Told RN that she had a terrible time with last miscarriage  U/S ordered-- patient would like something else for pain Dilaudid 1mg  IM given 19:15  Discussed U/S finding with the patient. At this time, pain is much better controlled.  Options for continued expected management, Cytotec and D&C.  She only wants D&C.  I discussed HPI, lab findings with Dr. Vergie Living.  He asked that BHCG be added to labs.   Reported falling BHCG result to Dr. Vergie Living.  He will talk with Rhea Bleacher tomorrow about getting her on the schedule for [facility], that may be Wed or Thursday.  He suggested she also call [facility] and let them know and maybe they can schedule her at [facility] sooner. Discussed with patient.   Assessment and Plan  A:  Threatened miscarriage       Lower abdominal pain  Vaginal bleeding       Chest pain      U/S suspicious for failed pregnancy, documented BHCGs that are falling.   P:  Patient instructed as stated above to call [facility]      Pelvic rest until sxs resolve      Rx for Percocet #10 for pain       To expect call from [facility] regarding surgery schedule.       Dennison Mascot Hanan Mcwilliams 11/07/2017, 8:22 PM

## 2017-11-07 NOTE — MAU Note (Addendum)
Pt I s/p SAB confirmed on 10/11.  Bleeding started Saturday and has been increasing since Sunday.  She states she is filling a pad in an hour and passing clots larger than an egg.  Pt states that "Today I had one cramp that made me almost pass out."   And the pain is what is most concerning to her today.  Pt states that she has not taken anything for the pain.  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

## 2017-11-07 NOTE — Telephone Encounter (Signed)
Patient called requesting to speak with Nina Phillips, CNM. Advised patient that Dia Sitter is not working clinic today at this office. Pt reported severe cramping and heavy vaginal bleeding for the past two days. Per patient cramping is really bad were she felt like she could pass out. Advised patient to go to MAU for further evaluation. Pt stated understanding.  Clovis Pu, RN

## 2017-11-07 NOTE — Discharge Instructions (Signed)
Threatened Miscarriage °A threatened miscarriage is when you have vaginal bleeding during your first 20 weeks of pregnancy but the pregnancy has not ended. Your doctor will do tests to make sure you are still pregnant. The cause of the bleeding may not be known. This condition does not mean your pregnancy will end. It does increase the risk of it ending (complete miscarriage). °Follow these instructions at home: °· Make sure you keep all your doctor visits for prenatal care. °· Get plenty of rest. °· Do not have sex or use tampons if you have vaginal bleeding. °· Do not douche. °· Do not smoke or use drugs. °· Do not drink alcohol. °· Avoid caffeine. °Contact a doctor if: °· You have light bleeding from your vagina. °· You have belly pain or cramping. °· You have a fever. °Get help right away if: °· You have heavy bleeding from your vagina. °· You have clots of blood coming from your vagina. °· You have bad pain or cramps in your low back or belly. °· You have fever, chills, and bad belly pain. °This information is not intended to replace advice given to you by your health care provider. Make sure you discuss any questions you have with your health care provider. °Document Released: 12/25/2007 Document Revised: 06/19/2015 Document Reviewed: 11/07/2012 °Elsevier Interactive Patient Education © 2018 [employer] ° °

## 2017-11-08 ENCOUNTER — Other Ambulatory Visit: Payer: Self-pay | Admitting: Obstetrics and Gynecology

## 2017-11-08 ENCOUNTER — Telehealth: Payer: Self-pay | Admitting: *Deleted

## 2017-11-08 ENCOUNTER — Telehealth (HOSPITAL_COMMUNITY): Payer: Self-pay

## 2017-11-08 NOTE — Telephone Encounter (Signed)
Called and spoke w/ Trenton Founds, surgery date/time and pre-op instructions given

## 2017-11-09 ENCOUNTER — Ambulatory Visit (HOSPITAL_COMMUNITY): Payer: 59 | Admitting: Anesthesiology

## 2017-11-09 NOTE — Anesthesia Preprocedure Evaluation (Addendum)
Anesthesia Evaluation  Patient identified by MRN, date of birth, ID band Patient awake    Reviewed: Allergy & Precautions, NPO status , Patient's Chart, lab work & pertinent test results  History of Anesthesia Complications Negative for: history of anesthetic complications  Airway Mallampati: II  TM Distance: >3 FB Neck ROM: Full    Dental no notable dental hx. (+) Teeth Intact, Dental Advisory Given   Pulmonary neg pulmonary ROS,    Pulmonary exam normal breath sounds clear to auscultation       Cardiovascular negative cardio ROS Normal cardiovascular exam Rhythm:Regular Rate:Normal     Neuro/Psych negative neurological ROS  negative psych ROS   GI/Hepatic negative GI ROS, Neg liver ROS,   Endo/Other  Morbid obesity  Renal/GU negative Renal ROS     Musculoskeletal Gout   Abdominal   Peds  Hematology negative hematology ROS (+)   Anesthesia Other Findings Day of surgery medications reviewed with the patient.  Reproductive/Obstetrics Missed AB (7wks)                            Anesthesia Physical Anesthesia Plan  ASA: III  Anesthesia Plan: General   Post-op Pain Management:    Induction: Intravenous  PONV Risk Score and Plan: 3 and Ondansetron, Treatment may vary due to age or medical condition, Dexamethasone, Scopolamine patch - Pre-op and Midazolam  Airway Management Planned: LMA  Additional Equipment:   Intra-op Plan:   Post-operative Plan: Extubation in OR  Informed Consent: I have reviewed the patients History and Physical, chart, labs and discussed the procedure including the risks, benefits and alternatives for the proposed anesthesia with the patient or authorized representative who has indicated his/her understanding and acceptance.   Dental advisory given  Plan Discussed with: CRNA and Surgeon  Anesthesia Plan Comments: (CASE CANCELLED PRIOR TO OR ENTRY)       Anesthesia Quick Evaluation

## 2017-11-10 ENCOUNTER — Other Ambulatory Visit: Payer: Self-pay

## 2017-11-10 ENCOUNTER — Encounter (HOSPITAL_COMMUNITY): Payer: Self-pay

## 2017-11-10 ENCOUNTER — Ambulatory Visit (HOSPITAL_COMMUNITY)
Admission: RE | Admit: 2017-11-10 | Discharge: 2017-11-10 | Disposition: A | Discharge status: Home / Self Care | Payer: 59 | Source: Ambulatory Visit | Attending: Obstetrics and Gynecology | Admitting: Obstetrics and Gynecology

## 2017-11-10 ENCOUNTER — Encounter (HOSPITAL_COMMUNITY)
Admission: RE | Disposition: A | Discharge status: Home / Self Care | Payer: Self-pay | Source: Ambulatory Visit | Attending: Obstetrics and Gynecology

## 2017-11-10 ENCOUNTER — Ambulatory Visit (HOSPITAL_COMMUNITY): Payer: 59

## 2017-11-10 ENCOUNTER — Other Ambulatory Visit: Payer: 59 | Admitting: *Deleted

## 2017-11-10 ENCOUNTER — Other Ambulatory Visit: Payer: 59

## 2017-11-10 DIAGNOSIS — Z91018 Allergy to other foods: Secondary | ICD-10-CM | POA: Insufficient documentation

## 2017-11-10 DIAGNOSIS — Z882 Allergy status to sulfonamides status: Secondary | ICD-10-CM | POA: Diagnosis not present

## 2017-11-10 DIAGNOSIS — Z8249 Family history of ischemic heart disease and other diseases of the circulatory system: Secondary | ICD-10-CM | POA: Insufficient documentation

## 2017-11-10 DIAGNOSIS — O039 Complete or unspecified spontaneous abortion without complication: Secondary | ICD-10-CM

## 2017-11-10 DIAGNOSIS — Z6839 Body mass index (BMI) 39.0-39.9, adult: Secondary | ICD-10-CM | POA: Insufficient documentation

## 2017-11-10 DIAGNOSIS — M109 Gout, unspecified: Secondary | ICD-10-CM | POA: Diagnosis not present

## 2017-11-10 DIAGNOSIS — Z91013 Allergy to seafood: Secondary | ICD-10-CM | POA: Diagnosis not present

## 2017-11-10 DIAGNOSIS — O200 Threatened abortion: Secondary | ICD-10-CM

## 2017-11-10 DIAGNOSIS — Z538 Procedure and treatment not carried out for other reasons: Secondary | ICD-10-CM | POA: Diagnosis not present

## 2017-11-10 DIAGNOSIS — O021 Missed abortion: Secondary | ICD-10-CM | POA: Diagnosis present

## 2017-11-10 LAB — CBC
HEMATOCRIT: 33.8 % — AB (ref 36.0–46.0)
HEMOGLOBIN: 10.4 g/dL — AB (ref 12.0–15.0)
MCH: 22.2 pg — ABNORMAL LOW (ref 26.0–34.0)
MCHC: 30.8 g/dL (ref 30.0–36.0)
MCV: 72.1 fL — AB (ref 80.0–100.0)
PLATELETS: 392 10*3/uL (ref 150–400)
RBC: 4.69 MIL/uL (ref 3.87–5.11)
RDW: 17.2 % — AB (ref 11.5–15.5)
WBC: 10.3 10*3/uL (ref 4.0–10.5)
nRBC: 0 % (ref 0.0–0.2)

## 2017-11-10 SURGERY — DILATION AND EVACUATION, UTERUS
Anesthesia: General | Site: Vagina

## 2017-11-10 MED ORDER — DEXAMETHASONE SODIUM PHOSPHATE 4 MG/ML IJ SOLN
INTRAMUSCULAR | Status: AC
  Filled 2017-11-10: qty 1

## 2017-11-10 MED ORDER — LIDOCAINE HCL (CARDIAC) PF 100 MG/5ML IV SOSY
PREFILLED_SYRINGE | INTRAVENOUS | Status: AC
  Filled 2017-11-10: qty 5

## 2017-11-10 MED ORDER — SUCCINYLCHOLINE CHLORIDE 200 MG/10ML IV SOSY
PREFILLED_SYRINGE | INTRAVENOUS | Status: AC
  Filled 2017-11-10: qty 10

## 2017-11-10 MED ORDER — SCOPOLAMINE 1 MG/3DAYS TD PT72
MEDICATED_PATCH | TRANSDERMAL | Status: AC
  Administered 2017-11-10: 1.5 mg via TRANSDERMAL
  Filled 2017-11-10: qty 1

## 2017-11-10 MED ORDER — MIDAZOLAM HCL 2 MG/2ML IJ SOLN
INTRAMUSCULAR | Status: AC
  Filled 2017-11-10: qty 2

## 2017-11-10 MED ORDER — ONDANSETRON HCL 4 MG/2ML IJ SOLN
INTRAMUSCULAR | Status: AC
  Filled 2017-11-10: qty 2

## 2017-11-10 MED ORDER — FENTANYL CITRATE (PF) 100 MCG/2ML IJ SOLN
INTRAMUSCULAR | Status: AC
  Filled 2017-11-10: qty 2

## 2017-11-10 MED ORDER — KETOROLAC TROMETHAMINE 30 MG/ML IJ SOLN
INTRAMUSCULAR | Status: AC
  Filled 2017-11-10: qty 1

## 2017-11-10 MED ORDER — SCOPOLAMINE 1 MG/3DAYS TD PT72
1.0000 | MEDICATED_PATCH | Freq: Once | TRANSDERMAL | Status: DC
  Administered 2017-11-10: 1.5 mg via TRANSDERMAL

## 2017-11-10 MED ORDER — LACTATED RINGERS IV SOLN: INTRAVENOUS | Status: DC

## 2017-11-10 MED ORDER — PROPOFOL 10 MG/ML IV BOLUS
INTRAVENOUS | Status: AC
  Filled 2017-11-10: qty 20

## 2017-11-10 MED ORDER — DOXYCYCLINE HYCLATE 100 MG IV SOLR
200.0000 mg | INTRAVENOUS | Status: DC
  Filled 2017-11-10: qty 200

## 2017-11-10 NOTE — Progress Notes (Signed)
Patient to ultra sound ambulatory

## 2017-11-10 NOTE — H&P (Signed)
Nina Phillips is an 27 y.o. female G2P0010 diagnosed with missed abortion on 10/14 here for scheduled dilatation and evacuation. Patient with decreasing HCG 1060 (9 days ago) and 225 (3 days ago). Patient reports feeling cramping pain accompanied with heavy vaginal bleeding, changing 3-4 pads/day since Saturday.   Pertinent Gynecological History: Contraception: none DES exposure: denies Blood transfusions: none  Last pap: normal Date: 03/2017 OB History: G2, P0010   Menstrual History: Patient's last menstrual period was 09/19/2017.    Past Medical History:  Diagnosis Date  . Gout     Past Surgical History:  Procedure Laterality Date  . WISDOM TOOTH EXTRACTION      Family History  Problem Relation Age of Onset  . Cancer Other   . Hypertension Other   . Diabetes Other   . Hypertension Mother   . Gout Mother   . Diabetes Father   . Hypertension Maternal Grandfather   . Heart attack Maternal Grandfather   . Stroke Paternal Grandmother   . Hypertension Maternal Aunt   . Diabetes Paternal Grandfather     Social History:  reports that she has never smoked. She has never used smokeless tobacco. She reports that she does not drink alcohol or use drugs.  Allergies:  Allergies  Allergen Reactions  . Other Hives    Walnuts, tomatoes, bananas, watermelon, apples    . Shellfish Allergy Hives  . Bactrim [Sulfamethoxazole-Trimethoprim] Hives  . Sulfa Antibiotics Rash    Medications Prior to Admission  Medication Sig Dispense Refill Last Dose  . oxyCODONE-acetaminophen (PERCOCET/ROXICET) 5-325 MG tablet Take 1 tablet by mouth every 6 (six) hours as needed for severe pain. 10 tablet 0 11/09/2017 at 1200  . metoCLOPramide (REGLAN) 10 MG tablet Take 1 tablet (10 mg total) by mouth every 6 (six) hours. 30 tablet 0   . promethazine (PHENERGAN) 25 MG suppository Place 1 suppository (25 mg total) rectally every 6 (six) hours as needed for nausea or vomiting. USE VAGINALLY at bedtime  (Patient not taking: Reported on 11/07/2017) 12 each 0 Not Taking at Unknown time  . ranitidine (ZANTAC) 150 MG tablet Take 1 tablet (150 mg total) by mouth 2 (two) times daily. (Patient not taking: Reported on 11/07/2017) 60 tablet 0 Not Taking at Unknown time    ROS See pertinent in HPI Blood pressure (!) 139/91, pulse 83, temperature 98.4 F (36.9 C), temperature source Oral, resp. rate 18, height 5\' 5"  (1.651 m), weight 108.1 kg, last menstrual period 09/19/2017, SpO2 100 %. Physical Exam GENERAL: Well-developed, well-nourished female in no acute distress.  LUNGS: Clear to auscultation bilaterally.  HEART: Regular rate and rhythm. ABDOMEN: Soft, nontender, nondistended. No organomegaly. PELVIC: Deferred to OR EXTREMITIES: No cyanosis, clubbing, or edema, 2+ distal pulses.  Results for orders placed or performed during the hospital encounter of 11/10/17 (from the past 24 hour(s))  CBC     Status: Abnormal   Collection Time: 11/10/17  7:40 AM  Result Value Ref Range   WBC 10.3 4.0 - 10.5 K/uL   RBC 4.69 3.87 - 5.11 MIL/uL   Hemoglobin 10.4 (L) 12.0 - 15.0 g/dL   HCT 29.5 (L) 62.1 - 30.8 %   MCV 72.1 (L) 80.0 - 100.0 fL   MCH 22.2 (L) 26.0 - 34.0 pg   MCHC 30.8 30.0 - 36.0 g/dL   RDW 65.7 (H) 84.6 - 96.2 %   Platelets 392 150 - 400 K/uL   nRBC 0.0 0.0 - 0.2 %    US Ob Transvaginal  Result Date: 11/10/2017 CLINICAL DATA:  Threatened miscarriage.  Follow-up exam. EXAM: OBSTETRIC <14 WK ULTRASOUND TECHNIQUE: Transabdominal ultrasound was performed for evaluation of the gestation as well as the maternal uterus and adnexal regions. COMPARISON:  Pelvic ultrasound 11/07/2017 FINDINGS: Intrauterine gestational sac: None Yolk sac:  Not Visualized. Embryo:  Not Visualized. Cardiac Activity: Not Visualized. Maternal uterus/adnexae: Normal right and left ovaries. Corpus luteum right ovary. No free fluid in the pelvis. Bicornuate uterus. IMPRESSION: No gestational sac visualized on current  exam, compatible with interval miscarriage. Electronically Signed   By: Annia Belt M.D.   On: 11/10/2017 09:34   US Ob Transvaginal  Result Date: 11/10/2017 CLINICAL DATA:  Threatened miscarriage.  Follow-up exam. EXAM: OBSTETRIC <14 WK ULTRASOUND TECHNIQUE: Transabdominal ultrasound was performed for evaluation of the gestation as well as the maternal uterus and adnexal regions. COMPARISON:  Pelvic ultrasound 11/07/2017 FINDINGS: Intrauterine gestational sac: None Yolk sac:  Not Visualized. Embryo:  Not Visualized. Cardiac Activity: Not Visualized. Maternal uterus/adnexae: Normal right and left ovaries. Corpus luteum right ovary. No free fluid in the pelvis. Bicornuate uterus. IMPRESSION: No gestational sac visualized on current exam, compatible with interval miscarriage. Electronically Signed   By: Annia Belt M.D.   On: 11/10/2017 09:34   US Ob Transvaginal  Result Date: 11/07/2017 CLINICAL DATA:  Pain with vaginal bleeding for 5 days EXAM: TRANSVAGINAL OB ULTRASOUND TECHNIQUE: Transvaginal ultrasound was performed for complete evaluation of the gestation as well as the maternal uterus, adnexal regions, and pelvic cul-de-sac. COMPARISON:  11/01/2017 FINDINGS: Intrauterine gestational sac: Visible in the right horn of the uterus Yolk sac:  Visible Embryo:  Likely visualized Cardiac Activity: Not seen CRL: 2.4 mm   5 w 5 d                  [name] EDC: 07/05/2018 Subchorionic hemorrhage:  None visualized. Maternal uterus/adnexae: Ovaries are within normal limits. Left ovary measures 1.5 x 2.4 x 1.4 cm. Right ovary measures 2.7 x 1.6 x 2.2 cm. 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 No significant free fluid. IMPRESSION: 1. 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 IUP is present within the right horn. 2. Visible intrauterine gestational sac in the right horn with yolk sac now visible and probable small embryo. Average crown-rump length is 2.4 mm, no fetal  cardiac activity identified. Findings are suspicious but not yet definitive for failed pregnancy. Recommend follow-up US in 10-14 days for definitive diagnosis. This recommendation follows SRU consensus guidelines: Diagnostic Criteria for Nonviable Pregnancy Early in the First Trimester. Malva Limes Med 2013; 161:0960-45. Electronically Signed   By: Jasmine Pang M.D.   On: 11/07/2017 19:05   US Ob Less Than 14 Weeks With Ob Transvaginal  Result Date: 11/01/2017 CLINICAL DATA:  Back pain and first-trimester pregnancy EXAM: OBSTETRIC <14 WK [name] AND TRANSVAGINAL OB US TECHNIQUE: Both transabdominal and transvaginal ultrasound examinations were performed for complete evaluation of the gestation as well as the maternal uterus, adnexal regions, and pelvic cul-de-sac. Transvaginal technique was performed to assess early pregnancy. COMPARISON:  04/11/2017 FINDINGS: Intrauterine gestational sac: Presumed single Yolk sac:  Not Visualized. Embryo:  Not Visualized. MSD: 3.7 mm   5 w   0 d Subchorionic hemorrhage:  None visualized. Maternal uterus/adnexae: Bicornuate uterus. Questionable small subserosal fibroid measuring up to 13 mm. Corpus luteum on the right. IMPRESSION: 1. Probable early intrauterine gestational sac (5 weeks), but no yolk sac or fetal pole. Recommend follow-up quantitative B-HCG levels  and follow-up US in 14 days to assess viability. This recommendation follows SRU consensus guidelines: Diagnostic Criteria for Nonviable Pregnancy Early in the First Trimester. Malva Limes Med 2013; 161:0960-45. 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 Electronically Signed   By: Marnee Spring M.D.   On: 11/01/2017 19:12   Assessment/Plan: 27 yo with complete miscarriage - Patient informed of ultrasound results - D&C cancelled - Patient to follow up in the office in 1-2 weeks - Patient verbalized understanding and all questions were answered  Jamesha Ellsworth 11/10/2017, 9:46 AM

## 2017-11-10 NOTE — Progress Notes (Signed)
   Patient in clinic for repeat Beta Hcg. Will call with results.  Martin, Tamika L, RN  

## 2017-11-10 NOTE — Progress Notes (Signed)
Patient surgery cancelled after Dr. Jolayne Panther reviewing ultrasound results. Discharged ambulatory with boyfriend. Scopolamine patch removed

## 2017-11-11 LAB — BETA HCG QUANT (REF LAB): HCG QUANT: 31 m[IU]/mL

## 2017-11-16 ENCOUNTER — Other Ambulatory Visit: Payer: Self-pay

## 2017-11-16 ENCOUNTER — Encounter (HOSPITAL_COMMUNITY): Payer: Self-pay

## 2017-11-16 ENCOUNTER — Ambulatory Visit (HOSPITAL_COMMUNITY)
Admission: EM | Admit: 2017-11-16 | Discharge: 2017-11-16 | Disposition: A | Discharge status: Home / Self Care | Payer: 59 | Attending: Family Medicine | Admitting: Family Medicine

## 2017-11-16 DIAGNOSIS — J0100 Acute maxillary sinusitis, unspecified: Secondary | ICD-10-CM | POA: Diagnosis not present

## 2017-11-16 DIAGNOSIS — J40 Bronchitis, not specified as acute or chronic: Secondary | ICD-10-CM

## 2017-11-16 MED ORDER — AMOXICILLIN 875 MG PO TABS: 875.0000 mg | ORAL_TABLET | Freq: Two times a day (BID) | ORAL | 0 refills | Status: DC

## 2017-11-16 MED ORDER — ALBUTEROL SULFATE HFA 108 (90 BASE) MCG/ACT IN AERS: 2.0000 | INHALATION_SPRAY | RESPIRATORY_TRACT | 1 refills | Status: DC | PRN

## 2017-11-16 MED ORDER — BENZONATATE 100 MG PO CAPS: 100.0000 mg | ORAL_CAPSULE | Freq: Three times a day (TID) | ORAL | 0 refills | Status: DC | PRN

## 2017-11-16 NOTE — ED Triage Notes (Signed)
Pt c/o chest congestion and cough and cold sweats.. X 3 days

## 2017-11-16 NOTE — ED Provider Notes (Addendum)
[facility]    CSN: [id number] Arrival date & time: 11/16/17  1410     History   Chief Complaint Chief Complaint  Patient presents with  . Cough    HPI Nina Phillips is a 27 y.o. female.   This is 1 of many visits for this 27 year old woman at [facility] urgent care center. Pt c/o chest congestion and cough and cold sweats.. X 8 days   patient has had chills and sweats.  She notes a decreased hearing bilaterally  Patient works in a [employer].  She denies smoking or asthma.       Past Medical History:  Diagnosis Date  . Gout     Patient Active Problem List   Diagnosis Date Noted  . Bacterial vaginitis 11/01/2017  . 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 Pregnancy with uncertain fetal viability 11/01/2017  . Missed abortion 04/12/2017  . 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 WISDOM TOOTH EXTRACTION      OB History    Gravida  2   Para      Term      Preterm      AB  1   Living  0     SAB  1   TAB      Ectopic      Multiple      Live Births               Home Medications    Prior to Admission medications   Medication Sig Start Date End Date Taking? Authorizing Provider  albuterol (PROVENTIL HFA;VENTOLIN HFA) 108 (90 Base) MCG/ACT inhaler Inhale 2 puffs into the lungs every 4 (four) hours as needed for wheezing or shortness of breath (cough, shortness of breath or wheezing.). 11/16/17   Elvina Sidle, MD  amoxicillin (AMOXIL) 875 MG tablet Take 1 tablet (875 mg total) by mouth 2 (two) times daily. 11/16/17   Elvina Sidle, MD  benzonatate (TESSALON) 100 MG capsule Take 1-2 capsules (100-200 mg total) by mouth 3 (three) times daily as needed for cough. 11/16/17   Elvina Sidle, MD  metoCLOPramide (REGLAN) 10 MG tablet Take 1 tablet (10 mg total) by mouth every 6 (six) hours. 11/01/17   Raelyn Mora, CNM    Family History Family History  Problem  Relation Age of Onset  . Cancer Other   . Hypertension Other   . Diabetes Other   . Hypertension Mother   . Gout Mother   . Diabetes Father   . Hypertension Maternal Grandfather   . Heart attack Maternal Grandfather   . Stroke Paternal Grandmother   . Hypertension Maternal Aunt   . Diabetes Paternal Grandfather     Social History Social History   Tobacco Use  . Smoking status: Never Smoker  . Smokeless tobacco: Never Used  Substance Use Topics  . Alcohol use: No    Frequency: Never    Comment: occas  . Drug use: No     Allergies   Other; Shellfish allergy; Bactrim [sulfamethoxazole-trimethoprim]; and Sulfa antibiotics   Review of Systems Review of Systems  Constitutional: Positive for chills and diaphoresis.  HENT: Positive for congestion and hearing loss.   Respiratory: Positive for cough. Negative for shortness of breath.   Cardiovascular: Negative.   Gastrointestinal: Negative.   Genitourinary: Negative.   Neurological: Negative.   All other systems reviewed and are negative.    Physical Exam Triage Vital Signs ED Triage  Vitals  Enc Vitals Group     BP 11/16/17 1435 (!) 142/78     Pulse Rate 11/16/17 1435 83     Resp 11/16/17 1435 18     Temp 11/16/17 1435 98.5 F (36.9 C)     Temp Source 11/16/17 1435 Oral     SpO2 11/16/17 1435 100 %     Weight --      Height --      Head Circumference --      Peak Flow --      Pain Score 11/16/17 1437 6     Pain Loc --      Pain Edu? --      Excl. in GC? --    No data found.  Updated Vital Signs BP (!) 142/78 (BP Location: Right Arm)   Pulse 83   Temp 98.5 F (36.9 C) (Oral)   Resp 18   LMP 09/19/2017   SpO2 100%    Physical Exam  Constitutional: She is oriented to person, place, and time. She appears well-developed and well-nourished.  HENT:  Head: Normocephalic.  Right Ear: External ear normal.  Left Ear: External ear normal.  Mouth/Throat: Oropharynx is clear and moist.  No acute fluid noted  behind each of her TMs.  Eyes: Pupils are equal, round, and reactive to light. Conjunctivae are normal.  Neck: Normal range of motion. Neck supple.  Cardiovascular: Normal rate, regular rhythm and normal heart sounds.  Pulmonary/Chest: Effort normal and breath sounds normal.  Musculoskeletal: Normal range of motion.  Neurological: She is alert and oriented to person, place, and time.  Skin: Skin is warm and dry.  Psychiatric: She has a normal mood and affect.  Nursing note and vitals reviewed.    UC Treatments / Results  Labs (all labs ordered are listed, but only abnormal results are displayed) Labs Reviewed - No data to display  EKG None  Radiology No results found.  Procedures Procedures (including critical care time)  Medications Ordered in UC Medications - No data to display  Initial Impression / Assessment and Plan / UC Course  I have reviewed the triage vital signs and the nursing notes.  Pertinent labs & imaging results that were available during my care of the patient were reviewed by me and considered in my medical decision making (see chart for details).    Final Clinical Impressions(s) / UC Diagnoses   Final diagnoses:  Acute non-recurrent maxillary sinusitis  Bronchitis   Discharge Instructions   None    ED Prescriptions    Medication Sig Dispense Auth. Provider   benzonatate (TESSALON) 100 MG capsule Take 1-2 capsules (100-200 mg total) by mouth 3 (three) times daily as needed for cough. 40 capsule Elvina Sidle, MD   amoxicillin (AMOXIL) 875 MG tablet Take 1 tablet (875 mg total) by mouth 2 (two) times daily. 20 tablet Elvina Sidle, MD   albuterol (PROVENTIL HFA;VENTOLIN HFA) 108 (90 Base) MCG/ACT inhaler Inhale 2 puffs into the lungs every 4 (four) hours as needed for wheezing or shortness of breath (cough, shortness of breath or wheezing.). 1 Inhaler Elvina Sidle, MD     Controlled Substance Prescriptions [place] Controlled Substance  Registry consulted? Not Applicable   Elvina Sidle, MD 11/16/17 1503    Elvina Sidle, MD 11/16/17 1505

## 2017-11-24 ENCOUNTER — Ambulatory Visit (INDEPENDENT_AMBULATORY_CARE_PROVIDER_SITE_OTHER): Payer: 59 | Admitting: Obstetrics and Gynecology

## 2017-11-24 ENCOUNTER — Encounter: Payer: Self-pay | Admitting: Obstetrics and Gynecology

## 2017-11-24 VITALS — BP 118/54 | HR 87 | Ht 64.0 in | Wt 237.0 lb

## 2017-11-24 DIAGNOSIS — O039 Complete or unspecified spontaneous abortion without complication: Secondary | ICD-10-CM

## 2017-11-24 NOTE — Patient Instructions (Addendum)
[facility] (Dr. April Manson) [address] [place], [place] [address] [phone]

## 2017-11-24 NOTE — Progress Notes (Signed)
Patient ID: Nina Phillips, female   DOB: February 05, 1990, 27 y.o.   MRN: [id number]  GYNECOLOGY PROGRESS NOTE  History:  27 y.o. G2P0010 presents to [facility] office today for follow-up gyn visit after miscarriage. She reports no bleeding or any complaints with her medical condition. She does, however, have multiple complaints with her treatment at [facility]. She reports the day she went in to MAU for heavy VB she was "left in the lobby hurting and bleeding while other people after her were taken back before her. Once in a MAU room she was left there in a cold room for 8 hrs by herself." She reports she "caught bronchitis" from sitting in that cold room by herself in MAU. She states, "I don't ever want to have to deal with the emergency room part of that hospital ever again." She denies h/a, dizziness, shortness of breath, n/v, or fever/chills. She states that she was told by a MD at [facility] that she could have a corrective procedure on bicornuate uterus so that she can carry a pregnancy.  The following portions of the patient's history were reviewed and updated as appropriate: allergies, current medications, past family history, past medical history, past social history, past surgical history and problem list. Last pap smear on 04/13/2017 was normal, Negative HRHPV.  Review of Systems:  Pertinent items are noted in HPI.   Objective:  Physical Exam Blood pressure (!) 118/54, pulse 87, height 5\' 4"  (1.626 m), weight 237 lb (107.5 kg), last menstrual period 09/19/2017. VS reviewed, nursing note reviewed,  Constitutional: well developed, well nourished, no distress HEENT: normocephalic CV: normal rate Pulm/chest wall: normal effort Breast Exam: deferred Abdomen: soft Neuro: alert and oriented x 3 Skin: warm, dry Psych: affect normal Pelvic exam: Deferred per patient request  Assessment & Plan:  1. Miscarriage - Discussed with patient the likelihood for recurrent SABs - Contact information given  for Dr. April Manson to discuss options for future fertility - Advised to wait at least 2 normal menstrual cycles before TTC - Patient verbalized an understanding of the plan of care and agrees.    Nina Phillips, CNM 8:46 AM

## 2017-12-08 ENCOUNTER — Encounter (HOSPITAL_COMMUNITY): Payer: Self-pay | Admitting: Emergency Medicine

## 2017-12-08 ENCOUNTER — Ambulatory Visit (HOSPITAL_COMMUNITY)
Admission: EM | Admit: 2017-12-08 | Discharge: 2017-12-08 | Disposition: A | Discharge status: Home / Self Care | Payer: 59 | Attending: Family Medicine | Admitting: Family Medicine

## 2017-12-08 DIAGNOSIS — J069 Acute upper respiratory infection, unspecified: Secondary | ICD-10-CM | POA: Diagnosis not present

## 2017-12-08 DIAGNOSIS — B9789 Other viral agents as the cause of diseases classified elsewhere: Secondary | ICD-10-CM | POA: Diagnosis not present

## 2017-12-08 MED ORDER — CETIRIZINE HCL 10 MG PO CHEW: 10.0000 mg | CHEWABLE_TABLET | Freq: Every day | ORAL | 0 refills | Status: DC

## 2017-12-08 MED ORDER — ALBUTEROL SULFATE HFA 108 (90 BASE) MCG/ACT IN AERS
2.0000 | INHALATION_SPRAY | Freq: Once | RESPIRATORY_TRACT | Status: AC
  Administered 2017-12-08: 2 via RESPIRATORY_TRACT

## 2017-12-08 MED ORDER — FLUTICASONE PROPIONATE 50 MCG/ACT NA SUSP: 2.0000 | Freq: Every day | NASAL | 0 refills | Status: DC

## 2017-12-08 MED ORDER — ALBUTEROL SULFATE HFA 108 (90 BASE) MCG/ACT IN AERS
INHALATION_SPRAY | RESPIRATORY_TRACT | Status: AC
  Filled 2017-12-08: qty 6.7

## 2017-12-08 NOTE — Discharge Instructions (Addendum)
Inhaler given in office.  Use as needed for shortness of breath and/or wheezing Get plenty of rest and push fluids Zyrtec and flonase prescribed.  Use as directed for symptomatic relief Use OTC tylenol as needed for pain and fever Follow up with PCP or with community health for recheck and/or if symptoms persists Return or go to ER if you have any new or worsening symptoms such as fever, chills, fatigue, shortness of breath, wheezing, chest pain, nausea, changes in bowel or bladder habits, etc...Marland Kitchen

## 2017-12-08 NOTE — ED Provider Notes (Signed)
[facility]   [id number] 12/08/17 Arrival Time: 1050  Cc: URI and cough symptoms  SUBJECTIVE:  Nina Phillips is a 27 y.o. female who presents with persistent dry cough, sinus pressure, nasal congestion, and runny nose x 2 days.  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  Has NOT tried OTC medications.  Denies aggravating factors.  Denies previous symptoms in the past. Complains of feeling "hot," fatigued, wheezing, and post-tussive emesis.  Denies fever, chills, sore throat, SOB, chest pain, nausea, changes in bowel or bladder habits.    ROS: As per HPI.  Past Medical History:  Diagnosis Date  . Gout    Past Surgical History:  Procedure Laterality Date  . WISDOM TOOTH EXTRACTION     Allergies  Allergen Reactions  . Other Hives    Walnuts, tomatoes, bananas, watermelon, apples    . Shellfish Allergy Hives  . Bactrim [Sulfamethoxazole-Trimethoprim] Hives  . Sulfa Antibiotics Rash   No current facility-administered medications on file prior to encounter.    Current Outpatient Medications on File Prior to Encounter  Medication Sig Dispense Refill  . albuterol (PROVENTIL HFA;VENTOLIN HFA) 108 (90 Base) MCG/ACT inhaler Inhale 2 puffs into the lungs every 4 (four) hours as needed for wheezing or shortness of breath (cough, shortness of breath or wheezing.). 1 Inhaler 1  . amoxicillin (AMOXIL) 875 MG tablet Take 1 tablet (875 mg total) by mouth 2 (two) times daily. (Patient not taking: Reported on 12/08/2017) 20 tablet 0  . benzonatate (TESSALON) 100 MG capsule Take 1-2 capsules (100-200 mg total) by mouth 3 (three) times daily as needed for cough. (Patient not taking: Reported on 11/24/2017) 40 capsule 0  . metoCLOPramide (REGLAN) 10 MG tablet Take 1 tablet (10 mg total) by mouth every 6 (six) hours. (Patient not taking: Reported on 11/24/2017) 30 tablet 0  . Prenatal Vit-Fe Fumarate-FA (MULTIVITAMIN-PRENATAL) 27-0.8 MG TABS  tablet Take 1 tablet by mouth daily at 12 noon.      Social History   Socioeconomic History  . Marital status: Single    Spouse name: Not on file  . Number of children: Not on file  . Years of education: Not on file  . Highest education level: Not on file  Occupational History  . Not on file  Social Needs  . Financial resource strain: Not on file  . Food insecurity:    Worry: Not on file    Inability: Not on file  . Transportation needs:    Medical: Not on file    Non-medical: Not on file  Tobacco Use  . Smoking status: Never Smoker  . Smokeless tobacco: Never Used  Substance and Sexual Activity  . Alcohol use: No    Frequency: Never    Comment: occas  . Drug use: No  . Sexual activity: Not Currently    Birth control/protection: None  Lifestyle  . Physical activity:    Days per week: Not on file    Minutes per session: Not on file  . Stress: Not on file  Relationships  . Social connections:    Talks on phone: Not on file    Gets together: Not on file    Attends religious service: Not on file    Active member of club or organization: Not on file    Attends meetings of clubs or organizations: Not on file    Relationship status: Not on file  . Intimate partner violence:    Fear of current  or ex partner: Not on file    Emotionally abused: Not on file    Physically abused: Not on file    Forced sexual activity: Not on file  Other Topics Concern  . Not on file  Social History Narrative  . Not on file   Family History  Problem Relation Age of Onset  . Cancer Other   . Hypertension Other   . Diabetes Other   . Hypertension Mother   . Gout Mother   . Diabetes Father   . Hypertension Maternal Grandfather   . Heart attack Maternal Grandfather   . Stroke Paternal Grandmother   . Hypertension Maternal Aunt   . Diabetes Paternal Grandfather      OBJECTIVE:  Vitals:   12/08/17 1159  BP: (!) 142/67  Pulse: 80  Resp: 16  Temp: 98.4 F (36.9 C)  SpO2: 100%       General appearance: Alert, appears fatigued, but nontoxic; speaking in full sentences without difficulty HEENT:NCAT; Ears: EACs clear, TMs pearly gray; Eyes: PERRL.  EOM grossly intact. Nose: nares patent with clear rhinorrhea; Sinus: maxillary sinus tenderness; Throat: tonsils nonerythematous or enlarged, uvula midline  Neck: supple without LAD Lungs: clear to auscultation bilaterally without adventitious breath sounds; normal respiratory effort; persistent dry cough present Heart: regular rate and rhythm.  Radial pulses 2+ symmetrical bilaterally Skin: warm and dry Psychological: alert and cooperative; normal mood and affect  ASSESSMENT & PLAN:  1. Viral URI with cough     Meds ordered this encounter  Medications  . albuterol (PROVENTIL HFA;VENTOLIN HFA) 108 (90 Base) MCG/ACT inhaler 2 puff  . fluticasone (FLONASE) 50 MCG/ACT nasal spray    Sig: Place 2 sprays into both nostrils daily.    Dispense:  16 g    Refill:  0    Order Specific Question:   Supervising Provider    Answer:   Isa RankinMURRAY, LAURA WILSON [phone][[id number]]  . cetirizine (ZYRTEC) 10 MG chewable tablet    Sig: Chew 1 tablet (10 mg total) by mouth daily.    Dispense:  20 tablet    Refill:  0    Order Specific Question:   Supervising Provider    Answer:   Isa RankinMURRAY, LAURA WILSON [[id number]]    Inhaler given in office.  Use as needed for shortness of breath and/or wheezing Get plenty of rest and push fluids Zyrtec and flonase prescribed.  Use as directed for symptomatic relief Use OTC tylenol as needed for pain and fever Follow up with PCP for recheck and/or if symptoms persists Return or go to ER if you have any new or worsening symptoms such as fever, chills, fatigue, shortness of breath, wheezing, chest pain, nausea, changes in bowel or bladder habits, etc...  Reviewed expectations re: course of current medical issues. Questions answered. Outlined signs and symptoms indicating need for more acute intervention. Patient  verbalized understanding. After Visit Summary given.          Rennis HardingWurst, Jannie Doyle, PA-C 12/08/17 1244

## 2017-12-08 NOTE — ED Triage Notes (Signed)
Pt c/o coughing x2 days, congestion, swimmy head, states shes coughing so hard she throws up.

## 2018-02-15 ENCOUNTER — Ambulatory Visit (HOSPITAL_COMMUNITY)
Admission: EM | Admit: 2018-02-15 | Discharge: 2018-02-15 | Disposition: A | Discharge status: Home / Self Care | Payer: 59 | Attending: Family Medicine | Admitting: Family Medicine

## 2018-02-15 ENCOUNTER — Encounter (HOSPITAL_COMMUNITY): Payer: Self-pay

## 2018-02-15 DIAGNOSIS — M79601 Pain in right arm: Secondary | ICD-10-CM | POA: Insufficient documentation

## 2018-02-15 MED ORDER — CYCLOBENZAPRINE HCL 10 MG PO TABS: ORAL_TABLET | ORAL | 0 refills | Status: DC

## 2018-02-15 NOTE — ED Triage Notes (Signed)
Pt present that she is having right arm pain.  The pain starts from her right shoulder all the way to the hand. Her right hand is swollen and busied

## 2018-02-16 NOTE — ED Provider Notes (Signed)
[facility]   [id number] 02/15/18 Arrival Time: 1303  ASSESSMENT & PLAN:  1. Right arm pain    Suspect muscular etiology. Possibility of trapezius muscle spasm. No indication for imaging today. Discussed.  Meds ordered this encounter  Medications  . cyclobenzaprine (FLEXERIL) 10 MG tablet    Sig: Take 1 tablet by mouth 3 times daily as needed for muscle spasm. Warning: May cause drowsiness.    Dispense:  21 tablet    Refill:  0    Follow-up Information    [facility].   Specialty:  Urgent Care Why:  If symptoms worsen. Contact information: [address] [place] [address] [phone]           Reviewed expectations re: course of current medical issues. Questions answered. Outlined signs and symptoms indicating need for more acute intervention. Patient verbalized understanding. After Visit Summary given.  SUBJECTIVE: History from: patient. Nina Phillips is a 28 y.o. female who reports intermittent mild to moderate pain of her right shoulder/upper back; described as aching with occasional pain down her right arm. Onset: gradual, several days ago; maybe longer. Injury/trama: no. Symptoms have progressed to a point and plateaued since beginning. Aggravating factors: certain movements of RUE. Alleviating factors: rest. Associated symptoms: none reported. Extremity sensation changes or weakness: none. Self treatment: tried OTCs without relief of pain. History of similar: no. No CP/SOB.  Past Surgical History:  Procedure Laterality Date  . WISDOM TOOTH EXTRACTION      ROS: As per HPI. All other systems negative    OBJECTIVE:  Vitals:   02/15/18 1424  BP: 132/73  Pulse: 74  Resp: 16  Temp: 98.7 F (37.1 C)  TempSrc: Oral  SpO2: 100%    General appearance: alert; no distress HEENT: ; AT Neck: supple with FROM Extremities: . RUE: warm and well perfused; no tenderness over right  shoulder; without gross deformities; with no swelling; with no bruising; ROM: normal CV: brisk extremity capillary refill of RUE; 2+ radial pulse of RUE. Heart: RRR Lungs: CTAB Back: tender over R trapezius distribution Skin: warm and dry; no visible rashes Neurologic: gait normal; normal reflexes of RUE and LUE; normal sensation of RUE and LUE; normal strength of RUE and LUE Psychological: alert and cooperative; normal mood and affect  Allergies  Allergen Reactions  . Other Hives    Walnuts, tomatoes, bananas, watermelon, apples    . Shellfish Allergy Hives  . Bactrim [Sulfamethoxazole-Trimethoprim] Hives  . Sulfa Antibiotics Rash    Past Medical History:  Diagnosis Date  . Gout    Social History   Socioeconomic History  . Marital status: Single    Spouse name: Not on file  . Number of children: Not on file  . Years of education: Not on file  . Highest education level: Not on file  Occupational History  . Not on file  Social Needs  . Financial resource strain: Not on file  . Food insecurity:    Worry: Not on file    Inability: Not on file  . Transportation needs:    Medical: Not on file    Non-medical: Not on file  Tobacco Use  . Smoking status: Never Smoker  . Smokeless tobacco: Never Used  Substance and Sexual Activity  . Alcohol use: No    Frequency: Never    Comment: occas  . Drug use: No  . Sexual activity: Not Currently    Birth control/protection: None  Lifestyle  .  Physical activity:    Days per week: Not on file    Minutes per session: Not on file  . Stress: Not on file  Relationships  . Social connections:    Talks on phone: Not on file    Gets together: Not on file    Attends religious service: Not on file    Active member of club or organization: Not on file    Attends meetings of clubs or organizations: Not on file    Relationship status: Not on file  Other Topics Concern  . Not on file  Social History Narrative  . Not on file   Family  History  Problem Relation Age of Onset  . Cancer Other   . Hypertension Other   . Diabetes Other   . Hypertension Mother   . Gout Mother   . Diabetes Father   . Hypertension Maternal Grandfather   . Heart attack Maternal Grandfather   . Stroke Paternal Grandmother   . Hypertension Maternal Aunt   . Diabetes Paternal Grandfather    Past Surgical History:  Procedure Laterality Date  . WISDOM TOOTH EXTRACTION        Mardella Layman, MD 02/21/18 [phone]

## 2018-03-02 ENCOUNTER — Ambulatory Visit (HOSPITAL_COMMUNITY)
Admission: EM | Admit: 2018-03-02 | Discharge: 2018-03-02 | Disposition: A | Discharge status: Home / Self Care | Payer: 59 | Attending: Family Medicine | Admitting: Family Medicine

## 2018-03-02 ENCOUNTER — Encounter (HOSPITAL_COMMUNITY): Payer: Self-pay | Admitting: Emergency Medicine

## 2018-03-02 DIAGNOSIS — J111 Influenza due to unidentified influenza virus with other respiratory manifestations: Secondary | ICD-10-CM

## 2018-03-02 DIAGNOSIS — R059 Cough, unspecified: Secondary | ICD-10-CM

## 2018-03-02 DIAGNOSIS — R05 Cough: Secondary | ICD-10-CM

## 2018-03-02 DIAGNOSIS — R69 Illness, unspecified: Secondary | ICD-10-CM

## 2018-03-02 MED ORDER — HYDROCODONE-HOMATROPINE 5-1.5 MG/5ML PO SYRP: 5.0000 mL | ORAL_SOLUTION | Freq: Four times a day (QID) | ORAL | 0 refills | Status: DC | PRN

## 2018-03-02 NOTE — ED Triage Notes (Signed)
PT C/O: cold like sx onset yest associated w/headaches, nasal congestion, headache, nasal drainage, body aches, prod cough, chills, fevers  DENIES: vomiting, diarrhea  TAKING MEDS: none  A&O x4... NAD... Ambulatory

## 2018-03-02 NOTE — ED Provider Notes (Signed)
[facility]   [id number] 03/02/18 Arrival Time: 8466  ASSESSMENT & PLAN:  1. Influenza-like illness   2. Cough    No concern for bacterial illness such as pneumonia. Discussed.  See AVS for discharge instructions.  Meds ordered this encounter  Medications  . HYDROcodone-homatropine (HYCODAN) 5-1.5 MG/5ML syrup    Sig: Take 5 mLs by mouth every 6 (six) hours as needed for cough.    Dispense:  90 mL    Refill:  0   Work note provided. Cough medication sedation precautions. Discussed typical duration of symptoms. OTC symptom care as needed. Ensure adequate fluid intake and rest. May f/u with PCP or here as needed.  Reviewed expectations re: course of current medical issues. Questions answered. Outlined signs and symptoms indicating need for more acute intervention. Patient verbalized understanding. After Visit Summary given.   SUBJECTIVE: History from: patient.  Nina Phillips is a 28 y.o. female who presents with complaint of nasal congestion, post-nasal drainage, and a persistent dry cough; without sore throat. Onset abrupt, < 48 hours ago; with fatigue and with body aches. SOB: none. Wheezing: none. Fever: yes, subjective with chills. Overall normal PO intake without n/v. Known sick contacts: no. No specific or significant aggravating or alleviating factors reported. OTC treatment: none reported.  Received flu shot this year: yes.  Social History   Tobacco Use  Smoking Status Never Smoker  Smokeless Tobacco Never Used    ROS: As per HPI. All other systems negative.   OBJECTIVE:  Vitals:   03/02/18 1035  BP: (!) 145/82  Pulse: 87  Resp: 18  Temp: 99.8 F (37.7 C)  TempSrc: Oral  SpO2: 100%     General appearance: alert; appears fatigued HEENT: nasal congestion; clear runny nose; throat irritation secondary to post-nasal drainage Neck: supple without LAD CV: RRR Lungs: unlabored respirations, symmetrical air entry without wheezing; cough:  moderate Abd: soft Ext: no LE edema Skin: warm and dry Psychological: alert and cooperative; normal mood and affect   Allergies  Allergen Reactions  . Other Hives    Walnuts, tomatoes, bananas, watermelon, apples    . Shellfish Allergy Hives  . Bactrim [Sulfamethoxazole-Trimethoprim] Hives  . Sulfa Antibiotics Rash    Past Medical History:  Diagnosis Date  . Gout    Family History  Problem Relation Age of Onset  . Cancer Other   . Hypertension Other   . Diabetes Other   . Hypertension Mother   . Gout Mother   . Diabetes Father   . Hypertension Maternal Grandfather   . Heart attack Maternal Grandfather   . Stroke Paternal Grandmother   . Hypertension Maternal Aunt   . Diabetes Paternal Grandfather    Social History   Socioeconomic History  . Marital status: Single    Spouse name: Not on file  . Number of children: Not on file  . Years of education: Not on file  . Highest education level: Not on file  Occupational History  . Not on file  Social Needs  . Financial resource strain: Not on file  . Food insecurity:    Worry: Not on file    Inability: Not on file  . Transportation needs:    Medical: Not on file    Non-medical: Not on file  Tobacco Use  . Smoking status: Never Smoker  . Smokeless tobacco: Never Used  Substance and Sexual Activity  . Alcohol use: No    Frequency: Never    Comment: occas  . Drug use:  No  . Sexual activity: Not Currently    Birth control/protection: None  Lifestyle  . Physical activity:    Days per week: Not on file    Minutes per session: Not on file  . Stress: Not on file  Relationships  . Social connections:    Talks on phone: Not on file    Gets together: Not on file    Attends religious service: Not on file    Active member of club or organization: Not on file    Attends meetings of clubs or organizations: Not on file    Relationship status: Not on file  . Intimate partner violence:    Fear of current or ex  partner: Not on file    Emotionally abused: Not on file    Physically abused: Not on file    Forced sexual activity: Not on file  Other Topics Concern  . Not on file  Social History Narrative  . Not on file           Mardella LaymanHagler, Annslee Tercero, MD 03/02/18 1114

## 2018-03-02 NOTE — Discharge Instructions (Signed)

## 2018-03-29 NOTE — Telephone Encounter (Signed)
Note sent to nurse. 

## 2018-04-11 ENCOUNTER — Other Ambulatory Visit: Payer: Self-pay

## 2018-04-11 ENCOUNTER — Encounter: Payer: Self-pay | Admitting: Emergency Medicine

## 2018-04-11 ENCOUNTER — Ambulatory Visit
Admission: EM | Admit: 2018-04-11 | Discharge: 2018-04-11 | Disposition: A | Discharge status: Home / Self Care | Payer: 59 | Attending: Physician Assistant | Admitting: Physician Assistant

## 2018-04-11 DIAGNOSIS — B349 Viral infection, unspecified: Secondary | ICD-10-CM | POA: Diagnosis not present

## 2018-04-11 MED ORDER — ACETAMINOPHEN ER 650 MG PO TBCR: 650.0000 mg | EXTENDED_RELEASE_TABLET | Freq: Three times a day (TID) | ORAL | 0 refills | Status: DC | PRN

## 2018-04-11 MED ORDER — FLUTICASONE PROPIONATE 50 MCG/ACT NA SUSP: 2.0000 | Freq: Every day | NASAL | 0 refills | Status: DC

## 2018-04-11 MED ORDER — IPRATROPIUM BROMIDE 0.06 % NA SOLN: 2.0000 | Freq: Four times a day (QID) | NASAL | 12 refills | Status: DC

## 2018-04-11 NOTE — ED Provider Notes (Signed)
[name]    CSN: [id number] Arrival date & time: 04/11/18  1306     History   Chief Complaint Chief Complaint  Patient presents with  . Flu-Like Symptoms    HPI Nina Phillips is a 28 y.o. female.   28 year old female who is [redacted] weeks pregnant comes in for 1 day history of URI smyptoms. She has had mild nasal congestion/drainage, cough. States more significant symptoms include sinus pressure, ear and throat itching. States had a fever of 101.5 prior to arrival, no antipyretics taken. Denies chills, body aches. Denies chest pain, shortness of breath, wheezing. Denies abdominal pain, vaginal bleeding. No recent travels. Never smoker. Has not tried any medications.      Past Medical History:  Diagnosis Date  . Gout     Patient Active Problem List   Diagnosis Date Noted  . Bacterial vaginitis 11/01/2017  . 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 Pregnancy with uncertain fetal viability 11/01/2017  . 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 WISDOM TOOTH EXTRACTION      OB History    Gravida  2   Para      Term      Preterm      AB  1   Living  0     SAB  1   TAB      Ectopic      Multiple  1   Live Births               Home Medications    Prior to Admission medications   Medication Sig Start Date End Date Taking? Authorizing Provider  Prenatal Vit-Fe Fumarate-FA (MULTIVITAMIN-PRENATAL) 27-0.8 MG TABS tablet Take 1 tablet by mouth daily at 12 noon.   Yes [provider]  acetaminophen (TYLENOL 8 HOUR ARTHRITIS PAIN) 650 MG CR tablet Take 1 tablet (650 mg total) by mouth every 8 (eight) hours as needed for pain. 04/11/18   Cathie Hoops,  V, PA-C  albuterol (PROVENTIL HFA;VENTOLIN HFA) 108 (90 Base) MCG/ACT inhaler Inhale 2 puffs into the lungs every 4 (four) hours as needed for wheezing or shortness of breath (cough, shortness of breath or wheezing.). 11/16/17   Elvina Sidle, MD  fluticasone  (FLONASE) 50 MCG/ACT nasal spray Place 2 sprays into both nostrils daily. 04/11/18   Cathie Hoops,  V, PA-C  ipratropium (ATROVENT) 0.06 % nasal spray Place 2 sprays into both nostrils 4 (four) times daily. 04/11/18   Belinda Fisher, PA-C    Family History Family History  Problem Relation Age of Onset  . Cancer Other   . Hypertension Other   . Diabetes Other   . Hypertension Mother   . Gout Mother   . Diabetes Father   . Hypertension Maternal Grandfather   . Heart attack Maternal Grandfather   . Stroke Paternal Grandmother   . Hypertension Maternal Aunt   . Diabetes Paternal Grandfather     Social History Social History   Tobacco Use  . Smoking status: Never Smoker  . Smokeless tobacco: Never Used  Substance Use Topics  . Alcohol use: No    Frequency: Never    Comment: occas  . Drug use: No     Allergies   Other; Shellfish allergy; Bactrim [sulfamethoxazole-trimethoprim]; and Sulfa antibiotics   Review of Systems Review of Systems  Reason unable to perform ROS: See HPI as above.     Physical Exam Triage Vital Signs ED Triage Vitals  Enc Vitals Group     BP 04/11/18 1320 (!) 143/84     Pulse Rate 04/11/18 1320 74     Resp 04/11/18 1320 18     Temp 04/11/18 1320 98.5 F (36.9 C)     Temp Source 04/11/18 1320 Oral     SpO2 04/11/18 1320 96 %     Weight --      Height --      Head Circumference --      Peak Flow --      Pain Score 04/11/18 1321 5     Pain Loc --      Pain Edu? --      Excl. in GC? --    No data found.  Updated Vital Signs BP (!) 143/84 (BP Location: Left Arm)   Pulse 74   Temp 98.5 F (36.9 C) (Oral)   Resp 18   LMP 09/19/2017   SpO2 96%   Physical Exam Constitutional:      General: She is not in acute distress.    Appearance: She is well-developed. She is not ill-appearing, toxic-appearing or diaphoretic.  HENT:     Head: Normocephalic and atraumatic.     Right Ear: Tympanic membrane, ear canal and external ear normal. Tympanic membrane  is not erythematous or bulging.     Left Ear: Tympanic membrane, ear canal and external ear normal. Tympanic membrane is not erythematous or bulging.     Nose:     Right Sinus: Maxillary sinus tenderness present. No frontal sinus tenderness.     Left Sinus: Maxillary sinus tenderness present. No frontal sinus tenderness.     Mouth/Throat:     Mouth: Mucous membranes are moist.     Pharynx: Oropharynx is clear. Uvula midline.  Eyes:     Conjunctiva/sclera: Conjunctivae normal.     Pupils: Pupils are equal, round, and reactive to light.  Neck:     Musculoskeletal: Normal range of motion and neck supple.  Cardiovascular:     Rate and Rhythm: Normal rate and regular rhythm.     Heart sounds: Normal heart sounds. No murmur. No friction rub. No gallop.   Pulmonary:     Effort: Pulmonary effort is normal. No accessory muscle usage, prolonged expiration, respiratory distress or retractions.     Breath sounds: Normal breath sounds. No stridor, decreased air movement or transmitted upper airway sounds. No decreased breath sounds, wheezing, rhonchi or rales.  Skin:    General: Skin is warm and dry.  Neurological:     Mental Status: She is alert and oriented to person, place, and time.      UC Treatments / Results  Labs (all labs ordered are listed, but only abnormal results are displayed) Labs Reviewed - No data to display  EKG None  Radiology No results found.  Procedures Procedures (including critical care time)  Medications Ordered in UC Medications - No data to display  Initial Impression / Assessment and Plan / UC Course  I have reviewed the triage vital signs and the nursing notes.  Pertinent labs & imaging results that were available during my care of the patient were reviewed by me and considered in my medical decision making (see chart for details).    No alarming signs on exam. Patient with self reported fever of 101.5, however, no antipyretics taken. She has no body  aches/chills, lower suspicion for flu. Will provide symptomatic treatment at this time. Push fluids. Return precautions given. Patient expresses understanding and agrees  to plan.  Final Clinical Impressions(s) / UC Diagnoses   Final diagnoses:  Viral illness    ED Prescriptions    Medication Sig Dispense Auth. Provider   fluticasone (FLONASE) 50 MCG/ACT nasal spray Place 2 sprays into both nostrils daily. 1 g ,  V, PA-C   ipratropium (ATROVENT) 0.06 % nasal spray Place 2 sprays into both nostrils 4 (four) times daily. 15 mL ,  V, PA-C   acetaminophen (TYLENOL 8 HOUR ARTHRITIS PAIN) 650 MG CR tablet Take 1 tablet (650 mg total) by mouth every 8 (eight) hours as needed for pain. 30 tablet Threasa Alpha, [place] 04/11/18 1414

## 2018-04-11 NOTE — Discharge Instructions (Signed)
Start flonase, atrovent nasal spray for nasal congestion/drainage. You can use over the counter nasal saline rinse such as neti pot for nasal congestion. Keep hydrated, your urine should be clear to pale yellow in color. Tylenol for fever and pain. Monitor for any worsening of symptoms, chest pain, shortness of breath, wheezing, swelling of the throat, follow up for reevaluation.   For sore throat/cough try using a honey-based tea. Use 3 teaspoons of honey with juice squeezed from half lemon. Place shaved pieces of ginger into 1/2-1 cup of water and warm over stove top. Then mix the ingredients and repeat every 4 hours as needed.

## 2018-04-11 NOTE — ED Triage Notes (Signed)
Pt presents to [facility] for less than 24 hours of headache, feeling, ears and throat itching.

## 2018-04-11 NOTE — ED Notes (Signed)
Patient able to ambulate independently  

## 2018-04-12 ENCOUNTER — Other Ambulatory Visit: Payer: Self-pay | Admitting: Nephrology

## 2018-04-12 DIAGNOSIS — R509 Fever, unspecified: Secondary | ICD-10-CM

## 2018-04-12 NOTE — Progress Notes (Unsigned)
Temp 100.2 and dialysis PCT in need of Covid-19 screening before returning to dialysis unit

## 2018-04-13 ENCOUNTER — Telehealth: Payer: Self-pay | Admitting: Emergency Medicine

## 2018-04-13 DIAGNOSIS — R509 Fever, unspecified: Secondary | ICD-10-CM

## 2018-04-17 ENCOUNTER — Inpatient Hospital Stay (HOSPITAL_COMMUNITY)
Admission: AD | Admit: 2018-04-17 | Discharge: 2018-04-17 | Discharge status: Against Medical Advice | Payer: 59 | Attending: Obstetrics and Gynecology | Admitting: Obstetrics and Gynecology

## 2018-04-17 ENCOUNTER — Other Ambulatory Visit: Payer: Self-pay

## 2018-04-17 ENCOUNTER — Encounter (HOSPITAL_COMMUNITY): Payer: Self-pay | Admitting: *Deleted

## 2018-04-17 DIAGNOSIS — O039 Complete or unspecified spontaneous abortion without complication: Secondary | ICD-10-CM | POA: Insufficient documentation

## 2018-04-17 LAB — NOVEL CORONAVIRUS, NAA: SARS-COV-2, NAA: NOT DETECTED

## 2018-04-17 NOTE — MAU Note (Signed)
Had a miscarriage, been documented last wk,   Apparently the baby had been dead for 2 wks.   Was to have a D&C scheduled, but would told she would need to pay $1000 up front.  Just doesn't feel right.  ? Fever. Stomach doesn't feel right.  Cramping here and there, not constant.  ? Yeast infection, c/o itching burning and a little swelling down there. Marland Kitchen  Has had some brown spotting.  Pt was seen by MD, some sinus flaring- has seasonal allergies, highest temp was 101.5, fever broke same day(3/17).

## 2018-04-17 NOTE — MAU Note (Signed)
Call was made to [facility] by NP regarding pt placement, COVID-19 test pending.  Pt remained in triage with mask on, while waiting.    Pt came out to desk, tearful- stated- I am leaving. Reviewed reasons to return.Marland Kitchen after pt walked out - received call back from [facility], pt had tested neg.  Jolaine Artist Charity fundraiser, had received call- went to security and told pt.

## 2018-04-17 NOTE — MAU Provider Note (Signed)
Patient presented to the MAU with several concerns about a recent miscarriage diagnosed by Dr. Jamse Arn. She has a long standing history of miscarriage. While in the triage with the RN I was reviewing the patient's chart and noted a pending Covid 19 test that was collected at the drive through site on 2/03. The charge nurse was notified immediately and a mask was placed on the patient. The patient remained in triage while the RN and house coverage called IP.  The patient then came out of the triage room and said she would like to leave AMA. The patient then signed out AMA.  I was able to contact the patient via phone and notify her that her Covid results are negative.  I was also able to get some history and reason for her visit today in MAU. She was told by Dr. Jamse Arn that she has a 7 week SAB. He recommended a D&C so they could send the fetus off for pathology. She was unable to pay the 1k that was required prior to surgery. She presented today for a second opinion. I discussed this patient with Dr. Earlene Plater. The patient is having no vaginal bleeding and no pain at this time. No fever currently. I made her an appointment in the office at [facility] on Wednesday at 1435. She was encouraged to bring her records from the office with her. She plans to call Dr. Bernerd Limbo office to arrange this.  Patient was very grateful and agrees with the plan of care.    Duane Lope, NP 04/17/2018 8:19 PM

## 2018-04-19 ENCOUNTER — Telehealth (INDEPENDENT_AMBULATORY_CARE_PROVIDER_SITE_OTHER): Payer: 59 | Admitting: Obstetrics & Gynecology

## 2018-04-19 ENCOUNTER — Encounter: Payer: Self-pay | Admitting: Obstetrics & Gynecology

## 2018-04-19 ENCOUNTER — Other Ambulatory Visit: Payer: Self-pay

## 2018-04-19 DIAGNOSIS — N96 Recurrent pregnancy loss: Secondary | ICD-10-CM

## 2018-04-19 DIAGNOSIS — O021 Missed abortion: Secondary | ICD-10-CM | POA: Diagnosis not present

## 2018-04-19 MED ORDER — IBUPROFEN 600 MG PO TABS: 600.0000 mg | ORAL_TABLET | Freq: Four times a day (QID) | ORAL | 2 refills | Status: DC | PRN

## 2018-04-19 MED ORDER — MISOPROSTOL 200 MCG PO TABS: ORAL_TABLET | ORAL | 1 refills | Status: DC

## 2018-04-19 MED ORDER — PROMETHAZINE HCL 25 MG PO TABS: 25.0000 mg | ORAL_TABLET | Freq: Four times a day (QID) | ORAL | 2 refills | Status: DC | PRN

## 2018-04-19 MED ORDER — HYDROCODONE-ACETAMINOPHEN 5-325 MG PO TABS: 1.0000 | ORAL_TABLET | Freq: Four times a day (QID) | ORAL | 0 refills | Status: DC | PRN

## 2018-04-19 NOTE — Progress Notes (Signed)
TELEHEALTH VIRTUAL GYNECOLOGY VISIT ENCOUNTER NOTE  I connected with Nina Phillips on 04/19/18 at  2:35 PM EDT by telephone at home and verified that I am speaking with the correct person using two identifiers.   I discussed the limitations, risks, security and privacy concerns of performing an evaluation and management service by telephone and the reduced availability of in person appointments. I also discussed with the patient that there may be a patient responsible charge related to this service. The patient expressed understanding and agreed to proceed.   History:  Nina Phillips is a 28 y.o. [id number] female with 7 week MAB. She desired D&E so that the POCs can be sent for [facility] testing; but is aware that elective cases are not being scheduled.  She is interested in Misoprostol administration, just wants the [facility] kit.  She denies any fevers, abnormal vaginal discharge, bleeding, pelvic pain or other concerns.     Past Medical History:  Diagnosis Date  . Gout    Past Surgical History:  Procedure Laterality Date  . WISDOM TOOTH EXTRACTION     The following portions of the patient's history were reviewed and updated as appropriate: allergies, current medications, past family history, past medical history, past social history, past surgical history and problem list.   Health Maintenance:  Normal pap in 04/11/2017.  Review of Systems:  Pertinent items noted in HPI and remainder of comprehensive ROS otherwise negative.  Physical Exam:  Physical exam deferred due to nature of the encounter  Labs and Imaging Results for orders placed or performed in visit on 04/13/18 (from the past 336 hour(s))  Novel Coronavirus, NAA ([facility])  Drive up testing site only   Collection Time: 04/13/18  1:48 AM  Result Value Ref Range   SARS-CoV-2, NAA Not Detected Not Detected   No results found.    Assessment and Plan:     1. Missed abortion 2. History of recurrent miscarriages Early  Intrauterine Pregnancy Failure Treatment with Misoprostol _X__  Documented intrauterine pregnancy failure less than or equal to [redacted] weeks gestation _X__  No serious current illness _X__  Baseline Hgb greater than or equal to 10g/dl _X__  Patient has easily accessible transportation to the hospital _X__  Clear preference (only one right now) _X__  Practitioner/physician deems patient reliable _X__  Counseling by practitioner or physician _X__  Verbal consent obtained _X__ Medication dispensed - misoprostol (CYTOTEC) 200 MCG tablet; Place four tablets in between your gums and cheeks (two tablets on each side) OR insert four tablets vaginally  Dispense: 4 tablet; Refill: 1 - promethazine (PHENERGAN) 25 MG tablet; Take 1 tablet (25 mg total) by mouth every 6 (six) hours as needed for nausea or vomiting.  Dispense: 20 tablet; Refill: 2 - HYDROcodone-acetaminophen (NORCO/VICODIN) 5-325 MG tablet; Take 1-2 tablets by mouth every 6 (six) hours as needed for severe pain.  Dispense: 15 tablet; Refill: 0 - ibuprofen (ADVIL,MOTRIN) 600 MG tablet; Take 1 tablet (600 mg total) by mouth every 6 (six) hours as needed for headache, mild pain, moderate pain or cramping.  Dispense: 30 tablet; Refill: 2 Risks and benefits of medical management were carefully explained, including a success rate of 80-90%, the need for another person to be at home with her, and to call/come in if she had heavy bleeding, dizziness, or severe pain not relieved by medication.  She will call in one week after misoprostol administration; if there has been no passage of pregnancy, will consider repeat misoprostol.  Patient advised to call  or come in for evaluation for any concerning symptoms; bleeding precautions strictly emphasized.  Arrangements were made for patient to get the [facility] kit at home and use as instructed.  She will send the POCs as instructed.     I discussed the assessment and treatment plan with the patient. The patient was  provided an opportunity to ask questions and all were answered. The patient agreed with the plan and demonstrated an understanding of the instructions.   The patient was advised to call back or seek an in-person evaluation/go to the ED if the symptoms worsen or if the condition fails to improve as anticipated.  I provided 20 minutes of non-face-to-face time during this encounter.   Verita Schneiders, MD [facility] [employer], [place]

## 2018-04-20 ENCOUNTER — Encounter: Payer: Self-pay | Admitting: General Practice

## 2018-05-22 ENCOUNTER — Encounter: Payer: Self-pay | Admitting: General Practice

## 2018-07-04 ENCOUNTER — Emergency Department (HOSPITAL_COMMUNITY)
Admission: EM | Admit: 2018-07-04 | Discharge: 2018-07-04 | Disposition: A | Discharge status: Home / Self Care | Payer: 59 | Attending: Emergency Medicine | Admitting: Emergency Medicine

## 2018-07-04 ENCOUNTER — Ambulatory Visit (INDEPENDENT_AMBULATORY_CARE_PROVIDER_SITE_OTHER)
Admission: EM | Admit: 2018-07-04 | Discharge: 2018-07-04 | Disposition: A | Discharge status: Home / Self Care | Payer: 59 | Source: Home / Self Care

## 2018-07-04 ENCOUNTER — Emergency Department (HOSPITAL_COMMUNITY): Payer: 59

## 2018-07-04 ENCOUNTER — Telehealth: Payer: Self-pay | Admitting: Physician Assistant

## 2018-07-04 ENCOUNTER — Other Ambulatory Visit: Payer: Self-pay

## 2018-07-04 ENCOUNTER — Encounter: Payer: Self-pay | Admitting: Emergency Medicine

## 2018-07-04 ENCOUNTER — Encounter (HOSPITAL_COMMUNITY): Payer: Self-pay | Admitting: Emergency Medicine

## 2018-07-04 DIAGNOSIS — Z20828 Contact with and (suspected) exposure to other viral communicable diseases: Secondary | ICD-10-CM | POA: Diagnosis not present

## 2018-07-04 DIAGNOSIS — R739 Hyperglycemia, unspecified: Secondary | ICD-10-CM | POA: Diagnosis not present

## 2018-07-04 DIAGNOSIS — Z79899 Other long term (current) drug therapy: Secondary | ICD-10-CM | POA: Insufficient documentation

## 2018-07-04 DIAGNOSIS — Z1159 Encounter for screening for other viral diseases: Secondary | ICD-10-CM | POA: Diagnosis not present

## 2018-07-04 DIAGNOSIS — R202 Paresthesia of skin: Secondary | ICD-10-CM | POA: Diagnosis not present

## 2018-07-04 DIAGNOSIS — R200 Anesthesia of skin: Secondary | ICD-10-CM

## 2018-07-04 DIAGNOSIS — R51 Headache: Secondary | ICD-10-CM | POA: Diagnosis not present

## 2018-07-04 DIAGNOSIS — Z3A01 Less than 8 weeks gestation of pregnancy: Secondary | ICD-10-CM

## 2018-07-04 DIAGNOSIS — O9989 Other specified diseases and conditions complicating pregnancy, childbirth and the puerperium: Secondary | ICD-10-CM | POA: Insufficient documentation

## 2018-07-04 LAB — CBC WITH DIFFERENTIAL/PLATELET
Basophils Absolute: 0.1 10*3/uL (ref 0.0–0.2)
Basos: 1 %
EOS (ABSOLUTE): 0.1 10*3/uL (ref 0.0–0.4)
Eos: 1 %
Hematocrit: 38.1 % (ref 34.0–46.6)
Hemoglobin: 10.9 g/dL — ABNORMAL LOW (ref 11.1–15.9)
Immature Grans (Abs): 0 10*3/uL (ref 0.0–0.1)
Immature Granulocytes: 0 %
Lymphocytes Absolute: 2.2 10*3/uL (ref 0.7–3.1)
Lymphs: 20 %
MCH: 21.2 pg — ABNORMAL LOW (ref 26.6–33.0)
MCHC: 28.6 g/dL — ABNORMAL LOW (ref 31.5–35.7)
MCV: 74 fL — ABNORMAL LOW (ref 79–97)
Monocytes Absolute: 0.5 10*3/uL (ref 0.1–0.9)
Monocytes: 5 %
Neutrophils Absolute: 8.3 10*3/uL — ABNORMAL HIGH (ref 1.4–7.0)
Neutrophils: 73 %
Platelets: 397 10*3/uL (ref 150–450)
RBC: 5.13 x10E6/uL (ref 3.77–5.28)
RDW: 16.8 % — ABNORMAL HIGH (ref 11.7–15.4)
WBC: 11.1 10*3/uL — ABNORMAL HIGH (ref 3.4–10.8)

## 2018-07-04 LAB — URINALYSIS, ROUTINE W REFLEX MICROSCOPIC
Bilirubin Urine: NEGATIVE
Glucose, UA: >=500 mg/dL — AB
Hgb urine dipstick: NEGATIVE
Ketones, ur: NEGATIVE mg/dL
Leukocytes,Ua: NEGATIVE
Nitrite: NEGATIVE
Protein, ur: NEGATIVE mg/dL
Specific Gravity, Urine: 1 — ABNORMAL LOW (ref 1.005–1.030)
pH: 6 (ref 5.0–8.0)

## 2018-07-04 LAB — CBG MONITORING, ED
Glucose-Capillary: 237 mg/dL — ABNORMAL HIGH (ref 70–99)
Glucose-Capillary: 213 mg/dL — ABNORMAL HIGH (ref 70–99)

## 2018-07-04 LAB — BASIC METABOLIC PANEL
Anion gap: 13 (ref 5–15)
BUN: 11 mg/dL (ref 6–20)
CO2: 20 mmol/L — ABNORMAL LOW (ref 22–32)
Calcium: 9.7 mg/dL (ref 8.9–10.3)
Chloride: 98 mmol/L (ref 98–111)
Creatinine, Ser: 1.06 mg/dL — ABNORMAL HIGH (ref 0.44–1.00)
GFR calc Af Amer: >60 mL/min (ref >60)
GFR calc non Af Amer: >60 mL/min (ref >60)
Glucose, Bld: 366 mg/dL — ABNORMAL HIGH (ref 70–99)
Potassium: 4 mmol/L (ref 3.5–5.1)
Sodium: 131 mmol/L — ABNORMAL LOW (ref 135–145)

## 2018-07-04 LAB — POCT I-STAT EG7
Acid-base deficit: 4 mmol/L — ABNORMAL HIGH (ref 0.0–2.0)
Bicarbonate: 21.7 mmol/L (ref 20.0–28.0)
Calcium, Ion: 1.13 mmol/L — ABNORMAL LOW (ref 1.15–1.40)
HCT: 34 % — ABNORMAL LOW (ref 36.0–46.0)
Hemoglobin: 11.6 g/dL — ABNORMAL LOW (ref 12.0–15.0)
O2 Saturation: 95 %
Potassium: 3.8 mmol/L (ref 3.5–5.1)
Sodium: 136 mmol/L (ref 135–145)
TCO2: 23 mmol/L (ref 22–32)
pCO2, Ven: 39.4 mmHg — ABNORMAL LOW (ref 44.0–60.0)
pH, Ven: 7.349 (ref 7.250–7.430)
pO2, Ven: 79 mmHg — ABNORMAL HIGH (ref 32.0–45.0)

## 2018-07-04 LAB — CBC
HCT: 36.3 % (ref 36.0–46.0)
Hemoglobin: 11.1 g/dL — ABNORMAL LOW (ref 12.0–15.0)
MCH: 21.5 pg — ABNORMAL LOW (ref 26.0–34.0)
MCHC: 30.6 g/dL (ref 30.0–36.0)
MCV: 70.3 fL — ABNORMAL LOW (ref 80.0–100.0)
Platelets: 382 10*3/uL (ref 150–400)
RBC: 5.16 MIL/uL — ABNORMAL HIGH (ref 3.87–5.11)
RDW: 17.7 % — ABNORMAL HIGH (ref 11.5–15.5)
WBC: 13.7 10*3/uL — ABNORMAL HIGH (ref 4.0–10.5)
nRBC: 0 % (ref 0.0–0.2)

## 2018-07-04 LAB — COMPREHENSIVE METABOLIC PANEL
ALT: 9 IU/L (ref 0–32)
AST: 13 IU/L (ref 0–40)
Albumin/Globulin Ratio: 1.2 (ref 1.2–2.2)
Albumin: 4.2 g/dL (ref 3.9–5.0)
Alkaline Phosphatase: 177 IU/L — ABNORMAL HIGH (ref 39–117)
BUN/Creatinine Ratio: 11 (ref 9–23)
BUN: 12 mg/dL (ref 6–20)
Bilirubin Total: 0.3 mg/dL (ref 0.0–1.2)
CO2: 18 mmol/L — ABNORMAL LOW (ref 20–29)
Calcium: 9.8 mg/dL (ref 8.7–10.2)
Chloride: 95 mmol/L — ABNORMAL LOW (ref 96–106)
Creatinine, Ser: 1.1 mg/dL — ABNORMAL HIGH (ref 0.57–1.00)
GFR calc Af Amer: 80 mL/min/{1.73_m2} (ref >59)
GFR calc non Af Amer: 69 mL/min/{1.73_m2} (ref >59)
Globulin, Total: 3.4 g/dL (ref 1.5–4.5)
Glucose: 558 mg/dL (ref 65–99)
Potassium: 4.9 mmol/L (ref 3.5–5.2)
Sodium: 127 mmol/L — ABNORMAL LOW (ref 134–144)
Total Protein: 7.6 g/dL (ref 6.0–8.5)

## 2018-07-04 LAB — I-STAT BETA HCG BLOOD, ED (MC, WL, AP ONLY): I-stat hCG, quantitative: 198.5 m[IU]/mL — ABNORMAL HIGH (ref <5)

## 2018-07-04 LAB — SARS CORONAVIRUS 2: SARS Coronavirus 2: NOT DETECTED

## 2018-07-04 LAB — TSH: TSH: 1.71 u[IU]/mL (ref 0.450–4.500)

## 2018-07-04 LAB — VITAMIN B12: Vitamin B-12: 690 pg/mL (ref 232–1245)

## 2018-07-04 LAB — BETA-HYDROXYBUTYRIC ACID: Beta-Hydroxybutyric Acid: 0.3 mmol/L — ABNORMAL HIGH (ref 0.05–0.27)

## 2018-07-04 MED ORDER — DEXTROSE-NACL 5-0.45 % IV SOLN: INTRAVENOUS | Status: DC

## 2018-07-04 MED ORDER — SODIUM CHLORIDE 0.9 % IV BOLUS
1000.0000 mL | Freq: Once | INTRAVENOUS | Status: AC
  Administered 2018-07-04: 18:00:00 1000 mL via INTRAVENOUS

## 2018-07-04 MED ORDER — SODIUM CHLORIDE 0.9 % IV BOLUS
1000.0000 mL | Freq: Once | INTRAVENOUS | Status: AC
  Administered 2018-07-04: 1000 mL via INTRAVENOUS

## 2018-07-04 MED ORDER — INSULIN REGULAR(HUMAN) IN NACL 100-0.9 UT/100ML-% IV SOLN: INTRAVENOUS | Status: DC

## 2018-07-04 NOTE — ED Notes (Signed)
Pt is requesting to leave, spoke to EDP.

## 2018-07-04 NOTE — ED Triage Notes (Signed)
Patient reports she was seen at [facility] earlier today for numbness to her left arm/hand since last night, states she is supposed to see a specialist tmrw for hand, was found to have blood sugar in the 500s there and sent here for further eval.

## 2018-07-04 NOTE — Telephone Encounter (Signed)
Patient called about recent lab work. Discussed critical results and needs to follow up with ED. Patient expresses understanding and will be heading to the ED for further evaluation needed.

## 2018-07-04 NOTE — ED Triage Notes (Signed)
Pt presents to [facility] for assessment of left arm numbness starting in the middle of the night.  Patient states she felt like she slept on it wrong.  Patient states the numbness and pain is now starting to spread up her arm.

## 2018-07-04 NOTE — ED Notes (Signed)
Patient transported to MRI 

## 2018-07-04 NOTE — ED Notes (Signed)
Patient able to ambulate independently  

## 2018-07-04 NOTE — ED Provider Notes (Addendum)
[name]    CSN: [id number] Arrival date & time: 07/04/18  1001     History   Chief Complaint Chief Complaint  Patient presents with  . Arm Pain  . Numbness    HPI Nina Phillips is a 28 y.o. female.   28 year old female who is currently [redacted] week pregnant comes in for 1 day history of left arm numbness. States she woke up last night with numbness/tingling to the left forearm/hand. She moved the arm around and went back to sleep. States woke up in the morning with worsening numbness and has tingling traveling to upper arm as well. She has full ROM of shoulder, elbow, wrist, fingers. Denies any loss of grip strength, one sided weakness. Denies swelling, erythema, warmth. Denies fever, chills, night sweats. She was at work and took her BP, in the 528U systolic and came in for evaluation. She does sleep either on her side, or on her stomach with arm outstretched.     Past Medical History:  Diagnosis Date  . Gout     Patient Active Problem List   Diagnosis Date Noted  . Bacterial vaginitis 11/01/2017  . 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 WISDOM TOOTH EXTRACTION      OB History    Gravida  3   Para      Term      Preterm      AB  1   Living  0     SAB  1   TAB      Ectopic      Multiple  1   Live Births               Home Medications    Prior to Admission medications   Medication Sig Start Date End Date Taking? Authorizing Provider  acetaminophen (TYLENOL 8 HOUR ARTHRITIS PAIN) 650 MG CR tablet Take 1 tablet (650 mg total) by mouth every 8 (eight) hours as needed for pain. 04/11/18   Tasia Catchings, Amy V, PA-C  albuterol (PROVENTIL HFA;VENTOLIN HFA) 108 (90 Base) MCG/ACT inhaler Inhale 2 puffs into the lungs every 4 (four) hours as needed for wheezing or shortness of breath (cough, shortness of breath or wheezing.).  11/16/17   Robyn Haber, MD  fluticasone (FLONASE) 50 MCG/ACT nasal spray Place 2 sprays into both nostrils daily. 04/11/18   Tasia Catchings, Amy V, PA-C  ibuprofen (ADVIL,MOTRIN) 600 MG tablet Take 1 tablet (600 mg total) by mouth every 6 (six) hours as needed for headache, mild pain, moderate pain or cramping. 04/19/18   Anyanwu, Sallyanne Havers, MD  ipratropium (ATROVENT) 0.06 % nasal spray Place 2 sprays into both nostrils 4 (four) times daily. 04/11/18   Tasia Catchings, Amy V, PA-C  Prenatal Vit-Fe Fumarate-FA (MULTIVITAMIN-PRENATAL) 27-0.8 MG TABS tablet Take 1 tablet by mouth daily at 12 noon.    [provider]  promethazine (PHENERGAN) 25 MG tablet Take 1 tablet (25 mg total) by mouth every 6 (six) hours as needed for nausea or vomiting. 04/19/18   Anyanwu, Sallyanne Havers, MD    Family History Family History  Problem Relation Age of Onset  . Cancer Other   . Hypertension Other   . Diabetes Other   . Hypertension Mother   . Gout Mother   . Diabetes Father   . Hypertension Maternal Grandfather   . Heart attack Maternal Grandfather   .  Stroke Paternal Grandmother   . Hypertension Maternal Aunt   . Diabetes Paternal Grandfather     Social History Social History   Tobacco Use  . Smoking status: Never Smoker  . Smokeless tobacco: Never Used  Substance Use Topics  . Alcohol use: No    Frequency: Never    Comment: occas  . Drug use: No     Allergies   Other; Shellfish allergy; Bactrim [sulfamethoxazole-trimethoprim]; and Sulfa antibiotics   Review of Systems Review of Systems  Reason unable to perform ROS: See HPI as above.     Physical Exam Triage Vital Signs ED Triage Vitals  Enc Vitals Group     BP 07/04/18 1013 137/87     Pulse Rate 07/04/18 1013 89     Resp 07/04/18 1013 20     Temp 07/04/18 1013 98.6 F (37 C)     Temp Source 07/04/18 1013 Oral     SpO2 07/04/18 1013 98 %     Weight --      Height --      Head Circumference --      Peak Flow --      Pain Score 07/04/18 1010  8     Pain Loc --      Pain Edu? --      Excl. in [place]? --    No data found.  Updated Vital Signs BP 137/87 (BP Location: Right Arm)   Pulse 89   Temp 98.6 F (37 C) (Oral)   Resp 20   LMP 02/11/2018   SpO2 98%   Physical Exam Constitutional:      General: She is not in acute distress.    Appearance: She is well-developed. She is not diaphoretic.  HENT:     Head: Normocephalic and atraumatic.  Eyes:     Conjunctiva/sclera: Conjunctivae normal.     Pupils: Pupils are equal, round, and reactive to light.  Cardiovascular:     Rate and Rhythm: Normal rate and regular rhythm.     Heart sounds: Normal heart sounds. No murmur. No friction rub. No gallop.   Pulmonary:     Effort: Pulmonary effort is normal. No accessory muscle usage or respiratory distress.     Breath sounds: Normal breath sounds. No stridor. No decreased breath sounds, wheezing, rhonchi or rales.  Musculoskeletal:     Comments: No swelling, erythema, contusion seen. No tenderness to palpation of the neck, shoulder. States tingling worse with palpation of the upper arm. States "no feeling" with palpation of the forearm and hand. Full ROM of neck, shoulder, elbow, wrist, fingers. Strength normal and equal bilaterally. Sensation decreased in upper arm, about 3/5. Patient states "no sensation" with forearm and hand. Unable to illicit pain reaction to the hand and forearm.  + reflex of tricep, bicep, brachioradialis.   Skin:    General: Skin is warm and dry.  Neurological:     Mental Status: She is alert and oriented to person, place, and time.     UC Treatments / Results  Labs (all labs ordered are listed, but only abnormal results are displayed) Labs Reviewed  CBC WITH DIFFERENTIAL/PLATELET  COMPREHENSIVE METABOLIC PANEL  VITAMIN Q76  TSH        EKG None  Radiology No results found.  Procedures Procedures (including critical care time)  Medications Ordered in UC Medications - No data to display   Initial Impression / Assessment and Plan / UC Course  I have reviewed the triage vital signs and  the nursing notes.  Pertinent labs & imaging results that were available during my care of the patient were reviewed by me and considered in my medical decision making (see chart for details).    Discussed case with Dr Joseph Art. Will draw labs for further evaluation including CMP, TSH, CBC, B12. Given able to illicit reflexes, able to monitor at this time. Patient to move extremity often and follow up tomorrow for reevaluation. Strict return precautions given. Patient expresses understanding and agrees to plan.  Case discussed with Dr Joseph Art, who agrees to plan.  Lab results called in by [facility], results will also be faxed.  WBC 11.1, Hgb 10.9, MCV 74, MCHC 28.6, RDW 16.8, Neutrophil abs 8.3  CBG 558 verified with repeat. Cr 1.10, BUN 11, Na 127, Cl 95, CO2 18, Alk phos 177  Vit B12 690  TSH pending.   Again discussed with Dr Joseph Art, given results, patient sent to the ED for further evaluation needed.   Final Clinical Impressions(s) / UC Diagnoses   Final diagnoses:  Left arm numbness   ED Prescriptions    None        Arturo Morton 07/04/18 Cumby, Amy V, PA-C 07/04/18 1419    Ok Edwards, PA-C 07/04/18 1455

## 2018-07-04 NOTE — ED Provider Notes (Signed)
[facility] EMERGENCY [facility] Provider Note   CSN: [id number] Arrival date & time: 07/04/18  1446    History   Chief Complaint Chief Complaint  Patient presents with  . Hyperglycemia    HPI Nina Phillips is a 28 y.o. female G3P0010 [redacted] weeks pregnant who presents today for evaluation of hyperglycemia and left arm numbness.  She reports that last night when she was laying in bed she was laying on her right side and noted that her left arm started to feel numb.  She says that she then got up and moved around after which it did not improve.  She went to bed and woke up this morning and it continued to feel numb.  She says that she has felt tingling but its primarily numb.  She reports that since this morning she has had a headache over her bilateral temples.  She denies any recent trauma.  No vision changes.    She reports that her left arm is numb from the elbow down.    She went to urgent care who got blood work and said that her sugar was in the 500s and sent her here.  She denies any personal history of diabetes or prediabetes.  She has not had blood work done in about one year.  She reports that about 10 days ago she noticed she was drinking more water than usual and was urinating more.  She has not had any steroids recently.      HPI  Past Medical History:  Diagnosis Date  . Gout     Patient Active Problem List   Diagnosis Date Noted  . Bacterial vaginitis 11/01/2017  . 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 WISDOM TOOTH EXTRACTION       OB History    Gravida  3   Para      Term      Preterm      AB  1   Living  0     SAB  1   TAB      Ectopic      Multiple  1   Live Births               Home Medications    Prior to Admission medications   Medication Sig Start Date End Date Taking? Authorizing  Provider  acetaminophen (TYLENOL) 500 MG tablet Take 500-1,000 mg by mouth every 6 (six) hours as needed for mild pain, moderate pain or headache.    Yes [provider]  albuterol (PROVENTIL HFA;VENTOLIN HFA) 108 (90 Base) MCG/ACT inhaler Inhale 2 puffs into the lungs every 4 (four) hours as needed for wheezing or shortness of breath (cough, shortness of breath or wheezing.). Patient taking differently: Inhale 2 puffs into the lungs every 4 (four) hours as needed for wheezing or shortness of breath (or coughing).  11/16/17  Yes Elvina SidleLauenstein, Kurt, MD  diphenhydrAMINE (BENADRYL) 25 mg capsule Take 25 mg by mouth every 6 (six) hours as needed for allergies (and allergic reactions).    Yes [provider]  ibuprofen (ADVIL,MOTRIN) 600 MG tablet Take 1 tablet (600 mg total) by mouth every 6 (six) hours as needed for headache, mild pain, moderate pain or cramping. 04/19/18  Yes Anyanwu, Jethro BastosUgonna A, MD  Prenatal Vit-Fe Fumarate-FA (MULTIVITAMIN-PRENATAL) 27-0.8 MG TABS tablet Take 1 tablet by mouth daily at 12  noon.   Yes [provider]  promethazine (PHENERGAN) 25 MG tablet Take 1 tablet (25 mg total) by mouth every 6 (six) hours as needed for nausea or vomiting. 04/19/18  Yes Anyanwu, Jethro BastosUgonna A, MD  acetaminophen (TYLENOL 8 HOUR ARTHRITIS PAIN) 650 MG CR tablet Take 1 tablet (650 mg total) by mouth every 8 (eight) hours as needed for pain. Patient not taking: Reported on 07/04/2018 04/11/18   Belinda FisherYu, Amy V, PA-C  fluticasone [facility](FLONASE) 50 MCG/ACT nasal spray Place 2 sprays into both nostrils daily. Patient not taking: Reported on 07/04/2018 04/11/18   Belinda FisherYu, Amy V, PA-C  ipratropium (ATROVENT) 0.06 % nasal spray Place 2 sprays into both nostrils 4 (four) times daily. Patient not taking: Reported on 07/04/2018 04/11/18   Lurline IdolYu, Amy V, PA-C    Family History Family History  Problem Relation Age of Onset  . Cancer Other   . Hypertension Other   . Diabetes Other   . Hypertension Mother   . Gout  Mother   . Diabetes Father   . Hypertension Maternal Grandfather   . Heart attack Maternal Grandfather   . Stroke Paternal Grandmother   . Hypertension Maternal Aunt   . Diabetes Paternal Grandfather     Social History Social History   Tobacco Use  . Smoking status: Never Smoker  . Smokeless tobacco: Never Used  Substance Use Topics  . Alcohol use: No    Frequency: Never    Comment: occas  . Drug use: No     Allergies   Apple fruit extract; Banana; Other; Shellfish allergy; Tomato; Watermelon [citrullus vulgaris]; Bactrim [sulfamethoxazole-trimethoprim]; and Sulfa antibiotics   Review of Systems Review of Systems  Constitutional: Negative for chills and fever.  HENT: Negative for congestion, ear pain and sinus pain.   Respiratory: Negative for chest tightness and shortness of breath.   Cardiovascular: Negative for chest pain.  Gastrointestinal: Negative for abdominal pain, nausea and vomiting.  Endocrine: Positive for polydipsia and polyuria.  Genitourinary: Negative for dysuria, flank pain, frequency, urgency, vaginal bleeding and vaginal discharge.  Musculoskeletal: Negative for back pain and neck pain.  Skin: Negative for color change.  Neurological: Positive for numbness and headaches. Negative for dizziness, tremors, facial asymmetry and weakness.  Psychiatric/Behavioral: Negative for behavioral problems. The patient is not nervous/anxious.   All other systems reviewed and are negative.    Physical Exam Updated Vital Signs BP 129/75 (BP Location: Right Arm)   Pulse 75   Temp 98.8 F (37.1 C) (Oral)   Resp 18   LMP 02/11/2018   SpO2 100%   Physical Exam Vitals signs and nursing note reviewed.  Constitutional:      General: She is not in acute distress.    Appearance: She is well-developed. She is not diaphoretic.  HENT:     Head: Normocephalic and atraumatic.     Right Ear: Tympanic membrane normal.     Left Ear: Tympanic membrane normal.      Mouth/Throat:     Mouth: Mucous membranes are moist.  Eyes:     General: No scleral icterus.       Right eye: No discharge.        Left eye: No discharge.     Conjunctiva/sclera: Conjunctivae normal.  Neck:     Musculoskeletal: Normal range of motion.  Cardiovascular:     Rate and Rhythm: Normal rate and regular rhythm.     Pulses: Normal pulses.     Heart sounds: Normal heart sounds.  Pulmonary:  Effort: Pulmonary effort is normal. No respiratory distress.     Breath sounds: No stridor.  Abdominal:     General: Abdomen is flat. There is no distension.  Musculoskeletal:        General: No deformity.     Right lower leg: No edema.     Left lower leg: No edema.     Comments: Palpation over left trapezious muscle is TTP.  There is radiation of the numbness/pain down the left arm with palpation of the anterior left shoulder.   Skin:    General: Skin is warm and dry.     Capillary Refill: Capillary refill takes less than 2 seconds.     Findings: No rash.  Neurological:     Mental Status: She is alert and oriented to person, place, and time.     Motor: No abnormal muscle tone.     Comments: Mental Status:  Alert, oriented, thought content appropriate, able to give a coherent history. Speech fluent without evidence of aphasia. Able to follow 2 step commands without difficulty.  Cranial Nerves:  II:  Peripheral visual fields grossly normal, pupils equal, round, reactive to light III,IV, VI: ptosis not present, extra-ocular motions intact bilaterally  V,VII: smile symmetric, facial light touch sensation equal VIII: hearing grossly normal to voice  X: uvula elevates symmetrically  XI: bilateral shoulder shrug symmetric and strong XII: midline tongue extension without fassiculations Motor:  Normal tone. 5/5 in upper and lower extremities bilaterally including strong and equal grip strength and dorsiflexion/plantar flexion Cerebellar: normal finger-to-nose with bilateral upper  extremities Gait: normal gait and balance CV: distal pulses palpable throughout  There is subjective decreased sensation to the right forearm and hand from the elbow distally.  She is able to feel that I am touching her, however unable to feel sharp from dull.     Psychiatric:        Mood and Affect: Mood normal.        Behavior: Behavior normal.      ED Treatments / Results  Labs (all labs ordered are listed, but only abnormal results are displayed) Labs Reviewed  BASIC METABOLIC PANEL - Abnormal; Notable for the following components:      Result Value   Sodium 131 (*)    CO2 20 (*)    Glucose, Bld 366 (*)    Creatinine, Ser 1.06 (*)    All other components within normal limits  CBC - Abnormal; Notable for the following components:   WBC 13.7 (*)    RBC 5.16 (*)    Hemoglobin 11.1 (*)    MCV 70.3 (*)    MCH 21.5 (*)    RDW 17.7 (*)    All other components within normal limits  URINALYSIS, ROUTINE W REFLEX MICROSCOPIC - Abnormal; Notable for the following components:   Color, Urine STRAW (*)    Specific Gravity, Urine 1.000 (*)    Glucose, UA >=500 (*)    Bacteria, UA RARE (*)    All other components within normal limits  BETA-HYDROXYBUTYRIC ACID - Abnormal; Notable for the following components:   Beta-Hydroxybutyric Acid 0.30 (*)    All other components within normal limits  CBG MONITORING, ED - Abnormal; Notable for the following components:   Glucose-Capillary 237 (*)    All other components within normal limits  I-STAT BETA HCG BLOOD, ED ([facility], [facility], [facility] ONLY) - Abnormal; Notable for the following components:   I-stat hCG, quantitative 198.5 (*)    All other components  within normal limits  POCT I-STAT EG7 - Abnormal; Notable for the following components:   pCO2, Ven 39.4 (*)    pO2, Ven 79.0 (*)    Acid-base deficit 4.0 (*)    Calcium, Ion 1.13 (*)    HCT 34.0 (*)    Hemoglobin 11.6 (*)    All other components within normal limits  CBG MONITORING, ED - Abnormal;  Notable for the following components:   Glucose-Capillary 213 (*)    All other components within normal limits  SARS CORONAVIRUS 2  I-STAT VENOUS BLOOD GAS, ED    EKG None  Radiology Mr Brain Wo Contrast  Result Date: 07/04/2018 CLINICAL DATA:  First trimester pregnancy. Left arm numbness and tingling beginning yesterday. EXAM: MRI HEAD WITHOUT CONTRAST TECHNIQUE: Multiplanar, multiecho pulse sequences of the brain and surrounding structures were obtained without intravenous contrast. COMPARISON:  None. FINDINGS: Brain: Diffusion imaging does not show any acute or subacute infarction. All other pulse sequences are likewise normal without evidence malformation, atrophy, old small or large vessel insult, mass lesion, hemorrhage, hydrocephalus or extra-axial collection. No sign of demyelinating disease. Vascular: Major vessels at the base of the brain show flow. Skull and upper cervical spine: Negative Sinuses/Orbits: Clear/normal Other: None IMPRESSION: Normal MRI of the brain. Electronically Signed   By: Nelson Chimes M.D.   On: 07/04/2018 20:57   Mr Cervical Spine Wo Contrast  Result Date: 07/04/2018 CLINICAL DATA:  Headache.  Left arm numbness. EXAM: MRI CERVICAL SPINE WITHOUT CONTRAST TECHNIQUE: Multiplanar, multisequence MR imaging of the cervical spine was performed. No intravenous contrast was administered. COMPARISON:  None. FINDINGS: The patient could not tolerate axial imaging. Sagittal only was obtained. Alignment: Normal Vertebrae: Normal Cord: Normal Posterior Fossa, vertebral arteries, paraspinal tissues: Normal Disc levels: Normal IMPRESSION: Normal MRI of the cervical spine. No evidence of demyelinating lesion of the cord. No evidence of disc pathology. No evidence of stenosis of the canal or foramina. Electronically Signed   By: Nelson Chimes M.D.   On: 07/04/2018 20:59   Mr Mrv Head Wo Cm  Result Date: 07/04/2018 CLINICAL DATA:  First trimester pregnancy. Headache and left arm  numbness beginning yesterday. EXAM: MR VENOGRAM OF THE  HEAD WITHOUT CONTRAST TECHNIQUE: Angiographic images of the intracranial venous structures were obtained using MRV technique without intravenous contrast. COMPARISON:  Brain MRI same day FINDINGS: Normal MR venography. Sagittal sinus is normal. Transverse and sigmoid sinuses are normal. Jugular veins are normal. Deep venous system is normal. No abnormal superficial veins identified. IMPRESSION: Normal MR venography. Electronically Signed   By: Nelson Chimes M.D.   On: 07/04/2018 21:00    Procedures Procedures (including critical care time)  Medications Ordered in ED Medications  sodium chloride 0.9 % bolus 1,000 mL (0 mLs Intravenous Stopped 07/04/18 1831)  sodium chloride 0.9 % bolus 1,000 mL (0 mLs Intravenous Stopped 07/04/18 2235)     Initial Impression / Assessment and Plan / ED Course  I have reviewed the triage vital signs and the nursing notes.  Pertinent labs & imaging results that were available during my care of the patient were reviewed by me and considered in my medical decision making (see chart for details).  Clinical Course as of Jul 05 2  Tue Jul 04, 2018  1712 MRI, MRV C-spine All with out contrast If negative treat as migraine Spoke with Dr. Leonel Ramsay   [EH]  [phone] Spoke with Dr. Ilda Basset OB/GYN who will consult.     [EH]  2003 Spoke with  Hospitalist, they state that as she is pregnant with no history of diabetes she would be a OB admit.    [EH]  2009 Spoke with Dr. Vergie Living, OB/GYN.  He says that that is a medicine admit, to call them back and if they have any questions they can call Dr. Vergie Living directly.    [EH]  2019 Spoke with Dr. Dartha Lodge, he says "she doesn't need to come in, give her pills and insulin and she can follow up with Dr. Gomez Cleverly tomorrow."  Despite the elevated betahydroxy, the Co2 of 20 with an acid base deficit elevated of 4.  She does not have ketones but has over 500 glucose in her urine.  I  attempted to explain about the MRI and numbness, he says "that is from the hyperglycemia."  I offered Dr. Vergie Living number and he declined. Dr. Rosalia Hammers aware of situation.    [EH]    Clinical Course User Index [EH] Cristina Gong, PA-C      Patient is a 28 year old woman approximately [redacted] weeks pregnant who presents today for evaluation of hyperglycemia.  She went to urgent care initially as she was having decreased sensation to light touch from the left elbow distal, where she was found to be significant hyperglycemic and was referred here.    Here her i-STAT hCG is 198.  VBG was obtained showing a PCO2 of 39.4 with acid base deficit of 4.  Her glucose on BMP was 366, consistent with diabetes.  She denies any steroids recently.  Her white count is slightly elevated at 13.7 with a hemoglobin of 11.1.  Her beta hydroxybutyric acid is elevated at 0.30.  CO2 is decreased at 20.  She does not have a significant anion gap and urine does not show ketones however does show over 500 glucose.  She was treated with 2 L of IV fluids after which her sugar reduced to 231.    Spoke with neurology who recommended MRIs. MRIs were reviewed without evidence of abnormalities or cause for her symptoms.  Given concern for new onset diabetes in a pregnant patient with no history commended admission.  This patient was discussed with Dr. Rosalia Hammers.  I spoke with hospitalist who declined admission.  I spoke with OB/GYN who recommended medical admission.   Patient reportedly told the nurse that she wished to leave, requested her IV be removed.  She was allowed to sign out AGAINST MEDICAL ADVICE.  I did not have the opportunity to personally counsel her on the risks of this decision prior to her leaving the department.     Final Clinical Impressions(s) / ED Diagnoses   Final diagnoses:  Hyperglycemia  Less than [redacted] weeks gestation of pregnancy  Numbness and tingling in left arm    ED Discharge Orders    None        Norman Clay 07/05/18 0011    Margarita Grizzle, MD 07/05/18 [phone]

## 2018-07-04 NOTE — ED Notes (Signed)
Pt ambulatory to room.

## 2018-07-04 NOTE — Discharge Instructions (Signed)
As discussed, we are drawing some lab to help determine what is causing current symptoms. Continue movement of the arm to help with blood flow. Follow up tomorrow for further evaluation needed. If experiencing motor loss, unable to move arm, go to the ED for further evaluation needed.

## 2018-07-05 ENCOUNTER — Telehealth (HOSPITAL_COMMUNITY): Payer: Self-pay | Admitting: Emergency Medicine

## 2018-07-05 ENCOUNTER — Telehealth: Payer: Self-pay | Admitting: *Deleted

## 2018-07-05 NOTE — Telephone Encounter (Signed)
-----   Message from Aletha Halim, MD sent at 07/05/2018 12:48 AM EDT ----- Regarding: needs a 48 hour beta hcg (stat) for early afternoon on 6/11 Patient left the ED against medical advice.

## 2018-07-05 NOTE — Telephone Encounter (Signed)
Called pt to advise her that the provider wants her to come to the clinic in the afternoon of 07/06/18 to have a STAT bhcg done.  Pt did not pick up.  Left voicemail requesting pt contact the clinic.  Mychart message sent.

## 2018-07-05 NOTE — Telephone Encounter (Signed)
Called patient to check up with her to see how she was doing after her ER visit, stastes she had two appointments already this morning with repeat CBG check and they will contact her with any changes in medicines etc. Pt appreciative of call, states her arm numbness is gone and she feels back to her normal self.

## 2018-07-10 ENCOUNTER — Telehealth: Payer: Self-pay | Admitting: General Practice

## 2018-07-10 NOTE — Telephone Encounter (Signed)
Called patient regarding coming in for appt with [facility], no answer- left message to call our office to schedule appts for follow up. Will send mychart message.

## 2018-07-17 ENCOUNTER — Encounter: Payer: Self-pay | Admitting: Family Medicine

## 2018-07-17 ENCOUNTER — Ambulatory Visit (INDEPENDENT_AMBULATORY_CARE_PROVIDER_SITE_OTHER): Payer: 59 | Admitting: Family Medicine

## 2018-07-17 ENCOUNTER — Encounter: Payer: Self-pay | Admitting: *Deleted

## 2018-07-17 ENCOUNTER — Other Ambulatory Visit: Payer: Self-pay

## 2018-07-17 VITALS — BP 124/78 | HR 66 | Temp 97.9°F | Resp 17 | Ht 64.0 in | Wt 223.0 lb

## 2018-07-17 DIAGNOSIS — Z3A01 Less than 8 weeks gestation of pregnancy: Secondary | ICD-10-CM

## 2018-07-17 DIAGNOSIS — E119 Type 2 diabetes mellitus without complications: Secondary | ICD-10-CM | POA: Insufficient documentation

## 2018-07-17 DIAGNOSIS — R8290 Unspecified abnormal findings in urine: Secondary | ICD-10-CM | POA: Diagnosis not present

## 2018-07-17 DIAGNOSIS — E1165 Type 2 diabetes mellitus with hyperglycemia: Secondary | ICD-10-CM

## 2018-07-17 DIAGNOSIS — R739 Hyperglycemia, unspecified: Secondary | ICD-10-CM | POA: Insufficient documentation

## 2018-07-17 LAB — POCT URINALYSIS DIP (CLINITEK)
Bilirubin, UA: NEGATIVE
Glucose, UA: 500 mg/dL — AB
Ketones, POC UA: NEGATIVE mg/dL
Nitrite, UA: NEGATIVE
POC PROTEIN,UA: 30 — AB
Spec Grav, UA: 1.02 (ref 1.010–1.025)
Urobilinogen, UA: 0.2 E.U./dL
pH, UA: 7 (ref 5.0–8.0)

## 2018-07-17 MED ORDER — BLOOD GLUCOSE METER KIT: PACK | 0 refills | Status: DC

## 2018-07-17 MED ORDER — LANTUS SOLOSTAR 100 UNIT/ML ~~LOC~~ SOPN: 10.0000 [IU] | PEN_INJECTOR | Freq: Every day | SUBCUTANEOUS | 99 refills | Status: DC

## 2018-07-17 MED ORDER — INSULIN PEN NEEDLE 29G X 5MM MISC: 0 refills | Status: DC

## 2018-07-17 NOTE — Progress Notes (Signed)
Nina Phillips, is a 28 y.o. female  ZOX:[id number]  [id number]  DOB - 10-08-1990  CC:  Chief Complaint  Patient presents with  . Hyperglycemia  . Establish Care       HPI: Nina Phillips is a 28 y.o. 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   Nina Phillips is currently recently pregnant. She has been seeing a fertility specialist and was recently diagnosed with pregnancy. During recent visit at the urgent care, [facility] with a complaint of left hand numbness. Provider completed a CMP which revealed a blood sugar greater than 500. Patient was sent to the ER for work-up and evaluation. In review of ER note, patient was recommended for admission medical management of hyperglycemia. Patient ultimately was discharged home without medication. She has continued to check her blood sugar at work and has obtained readings averaged 250 to more than 300. Reports an A1C 10.5 which was measured at her fertility clinic a few weeks ago. No A1C at recent ER visit. Family history includes a father with type 2 diabetes insulin-dependent and father with stroke.  Mother's history includes hypertension.  Patient reports over the last several months she has been pregnant at least 3 times and has had 3 miscarriages.  Miscarriages occur less than 3 months of pregnancy.  She has taken fertility drugs and is concerned that those medications has resulted in her developing diabetes.  She has a new patient appointment today with her OB/GYN that we will manage her current pregnancy.  She has no history of high blood pressure, hyperlipidemia, chronic headaches, or chest pain.  She has not developed any additional episodes of left hand numbness or extremity weakness.  She is asymptomatic of polyuria, polydipsia, polyphagia. Current medications: Current  Outpatient Medications:  .  acetaminophen (TYLENOL) 500 MG tablet, Take 500-1,000 mg by mouth every 6 (six) hours as needed for mild pain, moderate pain or headache. , Disp: , Rfl:  .  diphenhydrAMINE (BENADRYL) 25 mg capsule, Take 25 mg by mouth every 6 (six) hours as needed for allergies (and allergic reactions). , Disp: , Rfl:  .  ibuprofen (ADVIL,MOTRIN) 600 MG tablet, Take 1 tablet (600 mg total) by mouth every 6 (six) hours as needed for headache, mild pain, moderate pain or cramping., Disp: 30 tablet, Rfl: 2 .  Prenatal Vit-Fe Fumarate-FA (MULTIVITAMIN-PRENATAL) 27-0.8 MG TABS tablet, Take 1 tablet by mouth daily at 12 noon., Disp: , Rfl:  .  promethazine (PHENERGAN) 25 MG tablet, Take 1 tablet (25 mg total) by mouth every 6 (six) hours as needed for nausea or vomiting., Disp: 20 tablet, Rfl: 2   Pertinent family medical history: family history includes Cancer in an other family member; Diabetes in her father, paternal grandfather, and another family member; Gout in her mother; Heart attack in her maternal grandfather; Hypertension in her maternal aunt, maternal grandfather, mother, and another family member; Stroke in her paternal grandmother.   Allergies  Allergen Reactions  . Apple Fruit Extract Hives  . Banana Hives  . Other Hives    Walnuts   . Shellfish Allergy Hives  . Tomato Hives  . Watermelon [Citrullus Vulgaris] Hives  . Bactrim [Sulfamethoxazole-Trimethoprim] Hives and Rash  . Sulfa Antibiotics Hives and Rash    Social History   Socioeconomic History  . Marital status: Single    Spouse name: Not on file  .  Number of children: Not on file  . Years of education: Not on file  . Highest education level: Not on file  Occupational History  . Not on file  Social Needs  . Financial resource strain: Not on file  . Food insecurity    Worry: Not on file    Inability: Not on file  . Transportation needs    Medical: Not on file    Non-medical: Not on file  Tobacco Use   . Smoking status: Never Smoker  . Smokeless tobacco: Never Used  Substance and Sexual Activity  . Alcohol use: No    Frequency: Never    Comment: occas  . Drug use: No  . Sexual activity: Not Currently    Birth control/protection: None  Lifestyle  . Physical activity    Days per week: Not on file    Minutes per session: Not on file  . Stress: Not on file  Relationships  . Social Herbalist on phone: Not on file    Gets together: Not on file    Attends religious service: Not on file    Active member of club or organization: Not on file    Attends meetings of clubs or organizations: Not on file    Relationship status: Not on file  . Intimate partner violence    Fear of current or ex partner: Not on file    Emotionally abused: Not on file    Physically abused: Not on file    Forced sexual activity: Not on file  Other Topics Concern  . Not on file  Social History Narrative  . Not on file    Review of Systems: Pertinent negatives listed in HPI Objective:   Vitals:   07/17/18 1021  BP: 124/78  Pulse: 66  Resp: 17  Temp: 97.9 F (36.6 C)  SpO2: 100%    BP Readings from Last 3 Encounters:  07/04/18 129/75  07/04/18 137/87  04/17/18 133/82    Filed Weights   07/17/18 1021  Weight: 223 lb (101.2 kg)      Physical Exam: General appearance: alert, well developed, well nourished, cooperative and in no distress Head: Normocephalic, without obvious abnormality, atraumatic Respiratory: Respirations even and unlabored, normal respiratory rate Heart: rate and rhythm normal. No gallop or murmurs noted on exam  Extremities: No gross deformities Skin: Skin color, texture, turgor normal. No rashes seen  Psych: Appropriate mood and affect. Neurologic: Mental status: Alert, oriented to person, place, and time, thought content appropriate. Lab Results (prior encounters)  Lab Results  Component Value Date   WBC 13.7 (H) 07/04/2018   HGB 11.6 (L) 07/04/2018    HCT 34.0 (L) 07/04/2018   MCV 70.3 (L) 07/04/2018   PLT 382 07/04/2018   Lab Results  Component Value Date   CREATININE 1.06 (H) 07/04/2018   BUN 11 07/04/2018   NA 136 07/04/2018   K 3.8 07/04/2018   CL 98 07/04/2018   CO2 20 (L) 07/04/2018       Assessment and plan:  1. Hyperglycemia, new diabetes  Patient with a history of miscarriages (multiple) newly pregnant, with several weeks of hyperglycemia. Patient will warrant a higher level of management for diabetes, however, treatment for current hyperglycemia should not be delayed. Will start Lantus 10 units once daily today while referral are pending. Patient is encouraged to closely monitor blood sugar at home and return in 2 weeks for follow-up of blood sugar. Discussed at length the importance of food  choices and reducing high carbohydrate intake to improve blood sugar control. - Comprehensive metabolic panel - Hemoglobin A1c - POCT URINALYSIS DIP (CLINITEK), abnormal +glucose and + protein  - Microalbumin, urine - Ambulatory referral to Endocrinology - Ambulatory referral to diabetic education  2. Less than [redacted] weeks gestation of pregnancy (OB appointment  today) - POCT URINALYSIS DIP (CLINITEK) - Ambulatory referral to Endocrinology - Ambulatory referral to diabetic education  3. New onset type 2 diabetes mellitus (HCC),new diabetes  See # 1 - Ambulatory referral to Endocrinology - Ambulatory referral to diabetic education  4. Abnormal finding in urine, asymptomatic  - Urine Culture, pending   Return in about 2 weeks (around 07/31/2018) for Recheck your blood sugar.   The patient was given clear instructions to go to ER or return to medical center if symptoms don't improve, worsen or new problems develop. The patient verbalized understanding. The patient was advised  to call and obtain lab results if they haven't heard anything from out office within 7-10 business days.  Joaquin CourtsKimberly Sandralee Tarkington, FNP [facility]  [place] [address], [place] [id number] [phone]: [phone]    This note has been created with Dragon speech recognition software and Paediatric nursesmart phrase technology. Any transcriptional errors are unintentional.

## 2018-07-17 NOTE — Patient Instructions (Signed)
Diabetes Mellitus and Nutrition, Adult When you have diabetes (diabetes mellitus), it is very important to have healthy eating habits because your blood sugar (glucose) levels are greatly affected by what you eat and drink. Eating healthy foods in the appropriate amounts, at about the same times every day, can help you:  Control your blood glucose.  Lower your risk of heart disease.  Improve your blood pressure.  Reach or maintain a healthy weight. Every person with diabetes is different, and each person has different needs for a meal plan. Your health care provider may recommend that you work with a diet and nutrition specialist (dietitian) to make a meal plan that is best for you. Your meal plan may vary depending on factors such as:  The calories you need.  The medicines you take.  Your weight.  Your blood glucose, blood pressure, and cholesterol levels.  Your activity level.  Other health conditions you have, such as heart or kidney disease. How do carbohydrates affect me? Carbohydrates, also called carbs, affect your blood glucose level more than any other type of food. Eating carbs naturally raises the amount of glucose in your blood. Carb counting is a method for keeping track of how many carbs you eat. Counting carbs is important to keep your blood glucose at a healthy level, especially if you use insulin or take certain oral diabetes medicines. It is important to know how many carbs you can safely have in each meal. This is different for every person. Your dietitian can help you calculate how many carbs you should have at each meal and for each snack. Foods that contain carbs include:  Bread, cereal, rice, pasta, and crackers.  Potatoes and corn.  Peas, beans, and lentils.  Milk and yogurt.  Fruit and juice.  Desserts, such as cakes, cookies, ice cream, and candy. How does alcohol affect me? Alcohol can cause a sudden decrease in blood glucose (hypoglycemia),  especially if you use insulin or take certain oral diabetes medicines. Hypoglycemia can be a life-threatening condition. Symptoms of hypoglycemia (sleepiness, dizziness, and confusion) are similar to symptoms of having too much alcohol. If your health care provider says that alcohol is safe for you, follow these guidelines:  Limit alcohol intake to no more than 1 drink per day for nonpregnant women and 2 drinks per day for men. One drink equals 12 oz of beer, 5 oz of wine, or 1 oz of hard liquor.  Do not drink on an empty stomach.  Keep yourself hydrated with water, diet soda, or unsweetened iced tea.  Keep in mind that regular soda, juice, and other mixers may contain a lot of sugar and must be counted as carbs. What are tips for following this plan?  Reading food labels  Start by checking the serving size on the "Nutrition Facts" label of packaged foods and drinks. The amount of calories, carbs, fats, and other nutrients listed on the label is based on one serving of the item. Many items contain more than one serving per package.  Check the total grams (g) of carbs in one serving. You can calculate the number of servings of carbs in one serving by dividing the total carbs by 15. For example, if a food has 30 g of total carbs, it would be equal to 2 servings of carbs.  Check the number of grams (g) of saturated and trans fats in one serving. Choose foods that have low or no amount of these fats.  Check the number of  milligrams (mg) of salt (sodium) in one serving. Most people should limit total sodium intake to less than 2,300 mg per day.  Always check the nutrition information of foods labeled as "low-fat" or "nonfat". These foods may be higher in added sugar or refined carbs and should be avoided.  Talk to your dietitian to identify your daily goals for nutrients listed on the label. Shopping  Avoid buying canned, premade, or processed foods. These foods tend to be high in fat, sodium,  and added sugar.  Shop around the outside edge of the grocery store. This includes fresh fruits and vegetables, bulk grains, fresh meats, and fresh dairy. Cooking  Use low-heat cooking methods, such as baking, instead of high-heat cooking methods like deep frying.  Cook using healthy oils, such as olive, canola, or sunflower oil.  Avoid cooking with butter, cream, or high-fat meats. Meal planning  Eat meals and snacks regularly, preferably at the same times every day. Avoid going long periods of time without eating.  Eat foods high in fiber, such as fresh fruits, vegetables, beans, and whole grains. Talk to your dietitian about how many servings of carbs you can eat at each meal.  Eat 4-6 ounces (oz) of lean protein each day, such as lean meat, chicken, fish, eggs, or tofu. One oz of lean protein is equal to: ? 1 oz of meat, chicken, or fish. ? 1 egg. ?  cup of tofu.  Eat some foods each day that contain healthy fats, such as avocado, nuts, seeds, and fish. Lifestyle  Check your blood glucose regularly.  Exercise regularly as told by your health care provider. This may include: ? 150 minutes of moderate-intensity or vigorous-intensity exercise each week. This could be brisk walking, biking, or water aerobics. ? Stretching and doing strength exercises, such as yoga or weightlifting, at least 2 times a week.  Take medicines as told by your health care provider.  Do not use any products that contain nicotine or tobacco, such as cigarettes and e-cigarettes. If you need help quitting, ask your health care provider.  Work with a Social worker or diabetes educator to identify strategies to manage stress and any emotional and social challenges. Questions to ask a health care provider  Do I need to meet with a diabetes educator?  Do I need to meet with a dietitian?  What number can I call if I have questions?  When are the best times to check my blood glucose? Where to find more  information:  American Diabetes Association: diabetes.org  Academy of Nutrition and Dietetics: www.eatright.[employer] of Diabetes and Digestive and Kidney Diseases (NIH): [employer] Summary  A healthy meal plan will help you control your blood glucose and maintain a healthy lifestyle.  Working with a diet and nutrition specialist (dietitian) can help you make a meal plan that is best for you.  Keep in mind that carbohydrates (carbs) and alcohol have immediate effects on your blood glucose levels. It is important to count carbs and to use alcohol carefully. This information is not intended to replace advice given to you by your health care provider. Make sure you discuss any questions you have with your health care provider. Document Released: 10/08/2004 Document Revised: 08/11/2016 Document Reviewed: 02/16/2016 Elsevier Interactive Patient Education  2019 [place]. Insulin Glargine injection What is this medicine? INSULIN GLARGINE (IN su lin GLAR geen) is a human-made form of insulin. This drug lowers the amount of sugar in your blood. It is a long-acting insulin  that is usually given once a day. This medicine may be used for other purposes; ask your health care provider or pharmacist if you have questions. COMMON BRAND NAME(S): BASAGLAR, Lantus, Lantus SoloStar, Toujeo Max SoloStar, Toujeo SoloStar What should I tell my health care provider before I take this medicine? They need to know if you have any of these conditions: -episodes of low blood sugar -eye disease, vision problems -kidney disease -liver disease -an unusual or allergic reaction to insulin, metacresol, other medicines, foods, dyes, or preservatives -pregnant or trying to get pregnant -breast-feeding How should I use this medicine? This medicine is for injection under the skin. Use this medicine at the same time each day. Use exactly as directed. This insulin should never be mixed in the same  syringe with other insulins before injection. Do not vigorously shake before use. You will be taught how to use this medicine and how to adjust doses for activities and illness. Do not use more insulin than prescribed. Always check the appearance of your insulin before using it. This medicine should be clear and colorless like water. Do not use it if it is cloudy, thickened, colored, or has solid particles in it. If you use an insulin pen, be sure to take off the outer needle cover before using the dose. It is important that you put your used needles and syringes in a special sharps container. Do not put them in a trash can. If you do not have a sharps container, call your pharmacist or healthcare provider to get one. Talk to your pediatrician regarding the use of this medicine in children. Special care may be needed. Overdosage: If you think you have taken too much of this medicine contact a poison control center or emergency room at once. NOTE: This medicine is only for you. Do not share this medicine with others. What if I miss a dose? It is important not to miss a dose. Your health care professional or doctor should discuss a plan for missed doses with you. If you do miss a dose, follow their plan. Do not take double doses. What may interact with this medicine? -other medicines for diabetes Many medications may cause changes in blood sugar, these include: -alcohol containing beverages -antiviral medicines for HIV or AIDS -aspirin and aspirin-like drugs -certain medicines for blood pressure, heart disease, irregular heart beat -chromium -diuretics -female hormones, such as estrogens or progestins, birth control pills -fenofibrate -gemfibrozil -isoniazid -lanreotide -female hormones or anabolic steroids -MAOIs like Carbex, Eldepryl, Marplan, Nardil, and Parnate -medicines for weight loss -medicines for allergies, asthma, cold, or cough -medicines for depression, anxiety, or psychotic  disturbances -niacin -nicotine -NSAIDs, medicines for pain and inflammation, like ibuprofen or naproxen -octreotide -pasireotide -pentamidine -phenytoin -probenecid -quinolone antibiotics such as ciprofloxacin, levofloxacin, ofloxacin -some herbal dietary supplements -steroid medicines such as prednisone or cortisone -sulfamethoxazole; trimethoprim -thyroid hormones Some medications can hide the warning symptoms of low blood sugar (hypoglycemia). You may need to monitor your blood sugar more closely if you are taking one of these medications. These include: -beta-blockers, often used for high blood pressure or heart problems (examples include atenolol, metoprolol, propranolol) -clonidine -guanethidine -reserpine This list may not describe all possible interactions. Give your health care provider a list of all the medicines, herbs, non-prescription drugs, or dietary supplements you use. Also tell them if you smoke, drink alcohol, or use illegal drugs. Some items may interact with your medicine. What should I watch for while using this medicine? Visit your health care  professional or doctor for regular checks on your progress. Do not drive, use machinery, or do anything that needs mental alertness until you know how this medicine affects you. Alcohol may interfere with the effect of this medicine. Avoid alcoholic drinks. A test called the HbA1C (A1C) will be monitored. This is a simple blood test. It measures your blood sugar control over the last 2 to 3 months. You will receive this test every 3 to 6 months. Learn how to check your blood sugar. Learn the symptoms of low and high blood sugar and how to manage them. Always carry a quick-source of sugar with you in case you have symptoms of low blood sugar. Examples include hard sugar candy or glucose tablets. Make sure others know that you can choke if you eat or drink when you develop serious symptoms of low blood sugar, such as seizures or  unconsciousness. They must get medical help at once. Tell your doctor or health care professional if you have high blood sugar. You might need to change the dose of your medicine. If you are sick or exercising more than usual, you might need to change the dose of your medicine. Do not skip meals. Ask your doctor or health care professional if you should avoid alcohol. Many nonprescription cough and cold products contain sugar or alcohol. These can affect blood sugar. Make sure that you have the right kind of syringe for the type of insulin you use. Try not to change the brand and type of insulin or syringe unless your health care professional or doctor tells you to. Switching insulin brand or type can cause dangerously high or low blood sugar. Always keep an extra supply of insulin, syringes, and needles on hand. Use a syringe one time only. Throw away syringe and needle in a closed container to prevent accidental needle sticks. Insulin pens and cartridges should never be shared. Even if the needle is changed, sharing may result in passing of viruses like hepatitis or HIV. Each time you get a new box of pen needles, check to see if they are the same type as the ones you were trained to use. If not, ask your health care professional to show you how to use this new type properly. Wear a medical ID bracelet or chain, and carry a card that describes your disease and details of your medicine and dosage times. What side effects may I notice from receiving this medicine? Side effects that you should report to your doctor or health care professional as soon as possible: -allergic reactions like skin rash, itching or hives, swelling of the face, lips, or tongue -breathing problems -signs and symptoms of high blood sugar such as dizziness, dry mouth, dry skin, fruity breath, nausea, stomach pain, increased hunger or thirst, increased urination -signs and symptoms of low blood sugar such as feeling anxious,  confusion, dizziness, increased hunger, unusually weak or tired, sweating, shakiness, cold, irritable, headache, blurred vision, fast heartbeat, loss of consciousness Side effects that usually do not require medical attention (report to your doctor or health care professional if they continue or are bothersome): -increase or decrease in fatty tissue under the skin due to overuse of a particular injection site -itching, burning, swelling, or rash at site where injected This list may not describe all possible side effects. Call your doctor for medical advice about side effects. You may report side effects to FDA at 1-800-FDA-1088. Where should I keep my medicine? Keep out of the reach of children. Store  unopened vials in a refrigerator between 2 and 8 degrees C (36 and 46 degrees F). Do not freeze or use if the insulin has been frozen. Opened vials (vials currently in use) may be stored in the refrigerator or at room temperature, at approximately 25 degrees C (77 degrees F) or cooler. Keeping your insulin at room temperature decreases the amount of pain during injection. Once opened, your insulin can be used for 28 days. After 28 days, the vial should be thrown away. Store Lantus [employer], mining in a refrigerator between 2 and 8 degrees C (36 and 46 degrees F) or at room temperature below 30 degrees C (86 degrees F). Do not freeze or use if the insulin has been frozen. Once opened, the pens should be kept at room temperature. Do not store in the refrigerator once opened. Once opened, the insulin can be used for 28 days. After 28 days, the Lantus Solostar Pen or Basaglar KwikPen should be thrown away. Store Toujeo and [employer] in a refrigerator between 2 and 8 degrees C (36 and 46 degrees F). Do not freeze or use if the insulin has been frozen. Once opened, the pens should be kept at room temperature below 30 degrees C (86 degrees F). Do not store in the refrigerator once  opened. Once opened, the insulin can be used for 56 days. After 56 days, the Toujeo or [employer] Pen should be thrown away. Protect from light and excessive heat. Throw away any unused medicine after the expiration date or after the specified time for room temperature storage has passed. NOTE: This sheet is a summary. It may not cover all possible information. If you have questions about this medicine, talk to your doctor, pharmacist, or health care provider.  2019 Elsevier/Gold Standard (2017-07-14 15:06:27) Gestational Diabetes Mellitus, Self Care When you have gestational diabetes (gestational diabetes mellitus), you must make sure your blood sugar (glucose) stays in a healthy range. You can do this with:  Nutrition.  Exercise.  Lifestyle changes.  Medicines or insulin, if needed.  Support from your doctors and others. If you get treated for this condition, it may not hurt you or your unborn baby (fetus). If you do not get treated for this condition, it may cause problems that can hurt you or your unborn baby. If you get gestational diabetes, you are:  More likely to get it if you get pregnant again.  More likely to develop type 2 diabetes in the future. How to stay aware of blood sugar   Check your blood sugar every day while you are pregnant. Check it as often as told.  Call your doctor if your blood sugar is above your goal numbers for two tests in a row. Your doctor will set personal treatment goals for you. Generally, you should have these blood sugar levels:  Before meals, or after not eating for a long time (fasting or preprandial): at or below 95 mg/dL (5.3 mmol/L).  After meals (postprandial): ? One hour after a meal: at or below 140 mg/dL (7.8 mmol/L). ? Two hours after a meal: at or below 120 mg/dL (6.7 mmol/L).  A1c (hemoglobin A1c) level: 6-6.5%. How to manage high and low blood sugar Signs of high blood sugar High blood sugar is called hyperglycemia.  Know the early signs of high blood sugar. Signs may include:  Feeling: ? Thirsty. ? Hungry. ? Very tired.  Needing to pee (urinate) more than usual.  Blurry vision.  Signs of low blood sugar Low blood sugar is called hypoglycemia. This is when blood sugar is at or below 70 mg/dL (3.9 mmol/L). Signs may include:  Feeling: ? Hungry. ? Worried or nervous (anxious). ? Sweaty and clammy. ? Confused. ? Dizzy. ? Sleepy. ? Sick to your stomach (nauseous).  Having: ? A fast heartbeat. ? A headache. ? A change in your vision. ? Tingling or no feeling (numbness) around your mouth, lips, or tongue. ? Jerky movements that you cannot control (seizure).  Having trouble with: ? Moving (coordination). ? Sleeping. ? Passing out (fainting). ? Getting upset easily (irritability). Treating low blood sugar To treat low blood sugar, eat or drink something sugary right away. If you can think clearly and swallow safely, follow the 15:15 rule:  Take 15 grams of a fast-acting carb (carbohydrate). Talk with your doctor about how much you should take.  Some fast-acting carbs are: ? Sugar tablets (glucose pills). Take 3-4 glucose pills. ? 6-8 pieces of hard candy. ? 4-6 oz (120-150 mL) of fruit juice. ? 4-6 oz (120-150 mL) of regular (not diet) soda. ? 1 Tbsp (15 mL) honey or sugar.  Check your blood sugar 15 minutes after you take the carb.  If your blood sugar is still at or below 70 mg/dL (3.9 mmol/L), take 15 grams of a carb again.  If your blood sugar does not go above 70 mg/dL (3.9 mmol/L) after 3 tries, get help right away.  After your blood sugar goes back to normal, eat a meal or a snack within 1 hour. Treating very low blood sugar If your blood sugar is at or below 54 mg/dL (3 mmol/L), you have very low blood sugar (severe hypoglycemia). This is an emergency. Do not wait to see if the symptoms will go away. Get medical help right away. Call your local emergency services (911 in the  [place] If you have very low blood sugar and you cannot eat or drink, you may need a glucagon shot (injection). A family member or friend should learn how to check your blood sugar and how to give you a glucagon shot. Ask your doctor if you need to have a glucagon shot kit at home. Follow these instructions at home: Medicine  Take your insulin and diabetes medicines as told.  If your doctor says you should take more or less insulin or medicines, do this exactly as told.  Do not run out of insulin or medicines. Food   Make healthy food choices. These include: ? Chicken, fish, egg whites, and beans. ? Oats, whole wheat, bulgur, brown rice, quinoa, and millet. ? Fresh fruits and vegetables. ? Low-fat dairy products. ? Nuts, avocado, olive oil, and canola oil.  Meet with a food specialist (dietitian). He or she can help you make an eating plan that is right for you.  Follow instructions from your doctor about what you cannot eat or drink.  Drink enough fluid to keep your pee (urine) pale yellow.  Eat healthy snacks between healthy meals.  Keep track of carbs that you eat. Do this by reading food labels and learning food serving sizes.  Follow your sick day plan when you cannot eat or drink normally. Make this plan with your doctor so it is ready to use. Activity  Exercise for 30 or more minutes a day, or as much as your doctor recommends.  Talk with your doctor before you start a new exercise or activity. Your doctor may need to tell you to  change: ? How much insulin or medicines you take. ? How much food you eat. Lifestyle  Do not drink alcohol.  Do not use any tobacco products. These include cigarettes, chewing tobacco, and e-cigarettes. If you need help quitting, ask your doctor.  Learn how to deal with stress. If you need help with this, ask your doctor. Body care  Stay up to date with your shots (immunizations).  Brush your teeth and gums two times a day. Floss one or  more times a day.  Go to the dentist one or more times every 6 months.  Stay at a healthy weight while you are pregnant. General instructions  Take over-the-counter and prescription medicines only as told by your doctor.  Ask your doctor about risks of high blood pressure in pregnancy (preeclampsia and eclampsia).  Share your diabetes care plan with: ? Your work or school. ? People you live with.  Check your pee for ketones: ? When you are sick. ? As told by your doctor.  Carry a card or wear jewelry that says you have diabetes.  Keep all follow-up visits as told by your doctor. This is important. Care after giving birth  Have your blood sugar checked 4-12 weeks after you give birth.  Get checked for diabetes one or more times during 3 years. Questions to ask your doctor  Do I need to meet with a diabetes educator?  Where can I find a support group for people with gestational diabetes? Where to find more information To learn more about diabetes, visit:  American Diabetes Association: www.diabetes.org  Centers for Disease Control and Prevention (CDC): http://www.wolf.info/ Summary  Check your blood sugar (glucose) every day while you are pregnant. Check it as often as told.  Take your insulin and diabetes medicines as told.  Keep all follow-up visits as told by your doctor. This is important.  Have your blood sugar checked 4-12 weeks after you give birth. This information is not intended to replace advice given to you by your health care provider. Make sure you discuss any questions you have with your health care provider. Document Released: 05/05/2015 Document Revised: 07/04/2017 Document Reviewed: 02/14/2015 Elsevier Interactive Patient Education  2019 [employer].

## 2018-07-18 LAB — URINE CULTURE

## 2018-07-18 LAB — COMPREHENSIVE METABOLIC PANEL
ALT: 12 IU/L (ref 0–32)
AST: 10 IU/L (ref 0–40)
Albumin/Globulin Ratio: 1.2 (ref 1.2–2.2)
Albumin: 3.9 g/dL (ref 3.9–5.0)
Alkaline Phosphatase: 107 IU/L (ref 39–117)
BUN/Creatinine Ratio: 8 — ABNORMAL LOW (ref 9–23)
BUN: 7 mg/dL (ref 6–20)
Bilirubin Total: 0.2 mg/dL (ref 0.0–1.2)
CO2: 20 mmol/L (ref 20–29)
Calcium: 9.8 mg/dL (ref 8.7–10.2)
Chloride: 101 mmol/L (ref 96–106)
Creatinine, Ser: 0.93 mg/dL (ref 0.57–1.00)
GFR calc Af Amer: 98 mL/min/{1.73_m2} (ref >59)
GFR calc non Af Amer: 85 mL/min/{1.73_m2} (ref >59)
Globulin, Total: 3.2 g/dL (ref 1.5–4.5)
Glucose: 209 mg/dL — ABNORMAL HIGH (ref 65–99)
Potassium: 4.5 mmol/L (ref 3.5–5.2)
Sodium: 134 mmol/L (ref 134–144)
Total Protein: 7.1 g/dL (ref 6.0–8.5)

## 2018-07-18 LAB — HEMOGLOBIN A1C
Est. average glucose Bld gHb Est-mCnc: 258 mg/dL
Hgb A1c MFr Bld: 10.6 % — ABNORMAL HIGH (ref 4.8–5.6)

## 2018-07-18 LAB — MICROALBUMIN, URINE: Microalbumin, Urine: 190 ug/mL

## 2018-07-20 ENCOUNTER — Encounter: Payer: Self-pay | Admitting: Family Medicine

## 2018-07-20 ENCOUNTER — Other Ambulatory Visit: Payer: Self-pay | Admitting: Family Medicine

## 2018-07-20 MED ORDER — AMPICILLIN 500 MG PO CAPS: 500.0000 mg | ORAL_CAPSULE | Freq: Three times a day (TID) | ORAL | 0 refills | Status: AC

## 2018-07-20 NOTE — Progress Notes (Signed)
Orders only

## 2018-08-07 ENCOUNTER — Ambulatory Visit: Payer: 59 | Admitting: Family Medicine

## 2018-08-08 ENCOUNTER — Telehealth: Payer: Self-pay

## 2018-08-08 NOTE — Telephone Encounter (Signed)

## 2018-08-09 ENCOUNTER — Other Ambulatory Visit: Payer: Self-pay

## 2018-08-09 ENCOUNTER — Encounter: Payer: Self-pay | Admitting: Family Medicine

## 2018-08-09 ENCOUNTER — Encounter: Payer: Self-pay | Admitting: General Practice

## 2018-08-09 ENCOUNTER — Ambulatory Visit (INDEPENDENT_AMBULATORY_CARE_PROVIDER_SITE_OTHER): Payer: 59 | Admitting: Family Medicine

## 2018-08-09 VITALS — BP 122/77 | HR 68 | Temp 97.3°F | Resp 17 | Ht 64.0 in | Wt 229.8 lb

## 2018-08-09 DIAGNOSIS — Z331 Pregnant state, incidental: Secondary | ICD-10-CM | POA: Diagnosis not present

## 2018-08-09 DIAGNOSIS — E669 Obesity, unspecified: Secondary | ICD-10-CM

## 2018-08-09 DIAGNOSIS — E119 Type 2 diabetes mellitus without complications: Secondary | ICD-10-CM

## 2018-08-09 DIAGNOSIS — N96 Recurrent pregnancy loss: Secondary | ICD-10-CM

## 2018-08-09 DIAGNOSIS — E1165 Type 2 diabetes mellitus with hyperglycemia: Secondary | ICD-10-CM | POA: Diagnosis not present

## 2018-08-09 DIAGNOSIS — Z3A09 9 weeks gestation of pregnancy: Secondary | ICD-10-CM

## 2018-08-09 NOTE — Progress Notes (Signed)
Patient ID: Nina Phillips, female    DOB: 09-08-1990, 28 y.o.   MRN: [id number]  PCP: Scot Jun, FNP  Chief Complaint  Patient presents with  . Diabetes    Subjective:  HPI Nina Phillips is a 28 y.o. female presents for evaluation diabetes follow-up.  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    Nina Phillips is a newly diagnosed diabetic. She was last seen in office 3 weeks ago and found to have an A1C 10.6. She is 9 weeks and 6 days pregnant. She is high risk, as she has a history of recurrent miscarriages and recently diagnosed with being pregnant with 2 gestational sacs. She is obese with a prepregancy weight of >200 lbs. During her last visit, she was started on Lantus 10 units once daily. Home readings fasting have remained within an average of 140.  She has been referred to endocrinology who has attempted to reach out to her on multiple occasions however she has not been able to return the call and schedule a follow-up appointment. Social History   Socioeconomic History  . Marital status: Single    Spouse name: Not on file  . Number of children: Not on file  . Years of education: Not on file  . Highest education level: Not on file  Occupational History  . Not on file  Social Needs  . Financial resource strain: Not on file  . Food insecurity    Worry: Not on file    Inability: Not on file  . Transportation needs    Medical: Not on file    Non-medical: Not on file  Tobacco Use  . Smoking status: Never Smoker  . Smokeless tobacco: Never Used  Substance and Sexual Activity  . Alcohol use: No    Frequency: Never    Comment: occas  . Drug use: No  . Sexual activity: Not Currently    Birth control/protection: None  Lifestyle  . Physical activity    Days per week: Not on file   Minutes per session: Not on file  . Stress: Not on file  Relationships  . Social Herbalist on phone: Not on file    Gets together: Not on file    Attends religious service: Not on file    Active member of club or organization: Not on file    Attends meetings of clubs or organizations: Not on file    Relationship status: Not on file  . Intimate partner violence    Fear of current or ex partner: Not on file    Emotionally abused: Not on file    Physically abused: Not on file    Forced sexual activity: Not on file  Other Topics Concern  . Not on file  Social History Narrative  . Not on file    Family History  Problem Relation Age of Onset  . Cancer Other   . Hypertension Other   . Diabetes Other   . Hypertension Mother   . Gout Mother   . Diabetes Father   . Hypertension Maternal Grandfather   . Heart attack Maternal Grandfather   . Stroke Paternal Grandmother   . Hypertension Maternal Aunt   . Diabetes Paternal Grandfather      Review of Systems Pertinent negatives listed in HPI Allergies  Allergen Reactions  . Apple Fruit Extract  Hives  . Banana Hives  . Other Hives    Walnuts   . Shellfish Allergy Hives  . Tomato Hives  . Watermelon [Citrullus Vulgaris] Hives  . Bactrim [Sulfamethoxazole-Trimethoprim] Hives and Rash  . Sulfa Antibiotics Hives and Rash    Prior to Admission medications   Medication Sig Start Date End Date Taking? Authorizing Provider  Accu-Chek FastClix Lancets MISC USE TO CHECK BLOOD SUGAR FOUR TIMES DAILY AND AS NEEDED 07/17/18  Yes [provider]  ACCU-CHEK GUIDE test strip USE 1 STRIP 4 TIMES DAILY AND AS NEEDED 07/17/18  Yes [provider]  BD PEN NEEDLE NANO U/F 32G X 4 MM MISC USE ONE NEEDLE DAILY WITH INSULIN INJECTION 07/17/18  Yes [provider]  diphenhydrAMINE (BENADRYL) 25 mg capsule Take 25 mg by mouth every 6 (six) hours as needed for allergies (and allergic reactions).    Yes [provider]  folic acid (FOLVITE) 1 MG tablet TAKE 4 TABLETS BY MOUTH ONCE DAILY 07/25/18  Yes [provider]  Insulin Glargine (LANTUS SOLOSTAR) 100 UNIT/ML Solostar Pen Inject 10 Units into the skin daily. 07/17/18  Yes Bing NeighborsHarris, Ilyana Manuele S, FNP  Prenatal Vit-Fe Fumarate-FA (MULTIVITAMIN-PRENATAL) 27-0.8 MG TABS tablet Take 1 tablet by mouth daily at 12 noon.   Yes [provider]  acetaminophen (TYLENOL) 500 MG tablet Take 500-1,000 mg by mouth every 6 (six) hours as needed for mild pain, moderate pain or headache.     [provider]    Past Medical, Surgical Family and Social History reviewed and updated.    Objective:   Today's Vitals   08/09/18 1602  BP: 122/77  Pulse: 68  Resp: 17  Temp: (!) 97.3 F (36.3 C)  TempSrc: Temporal  SpO2: 99%  Weight: 229 lb 12.8 oz (104.2 kg)  Height: 5\' 4"  (1.626 m)    BP Readings from Last 3 Encounters:  08/09/18 122/77  07/17/18 124/78  07/04/18 129/75    Filed Weights   08/09/18 1602  Weight: 229 lb 12.8 oz (104.2 kg)       Physical Exam General appearance: alert, well developed, well nourished, cooperative and in no distress Head: Normocephalic, without obvious abnormality, atraumatic Respiratory: Respirations even and unlabored, normal respiratory rate Heart: rate and rhythm normal. No gallop or murmurs noted on exam  Abdomen: BS +, no distention, no rebound tenderness, or no mass Extremities: No gross deformities Skin: Skin color, texture, turgor normal. No rashes seen  Psych: Appropriate mood and affect. Neurologic: Mental status: Alert, oriented to person, place, and time, thought content appropriate. Lab Results  Component Value Date   HGBA1C 10.6 (H) 07/17/2018    Assessment & Plan:  1. [redacted] weeks gestation of pregnancy 2. New onset type 2 diabetes mellitus ([facility]) 3. History of recurrent miscarriages Given home readings have only moderately improved I am increasing Lantus to 15 units once  daily while patient is awaiting her appointment with endocrinology.  Reinforced the importance of that her health and overall health of her baby's that she is established and receives  with endocrinology. She received education regarding the adverse role poor glycemic control can play to the overall safety of her pregnancy.  Patient verbalized understanding.  She was also provided with information again to follow-up and schedule her endocrinology appointment.  Joaquin CourtsKimberly Eniola Cerullo, FNP [facility] [address], [place] [id number] [phone]: [phone]

## 2018-08-09 NOTE — Patient Instructions (Addendum)
Contact [facility] at [phone]   I am increasing your Lantus 15 units once daily to improve glycemic control.    Gestational Diabetes Mellitus, Diagnosis Gestational diabetes (gestational diabetes mellitus) is a short-term (temporary) form of diabetes that can happen during pregnancy. It goes away after you give birth. It may be caused by one or both of these problems:  Your pancreas does not make enough of a hormone called insulin.  Your body does not respond in a normal way to insulin that it makes. Insulin lets sugars (glucose) go into cells in the body. This gives you energy. If you have diabetes, sugars cannot get into cells. This causes high blood sugar (hyperglycemia). If you get gestational diabetes, you are:  More likely to get it if you get pregnant again.  More likely to develop type 2 diabetes in the future. If gestational diabetes is treated, it may not hurt you or your baby. Your doctor will set treatment goals for you. In general, you should have these blood sugar levels:  After not eating for a long time (fasting): 95 mg/dL (5.3 mmol/L).  After meals (postprandial): ? One hour after a meal: at or below 140 mg/dL (7.8 mmol/L). ? Two hours after a meal: at or below 120 mg/dL (6.7 mmol/L).  A1c (hemoglobin A1c) level: 6-6.5%. Follow these instructions at home: Questions to ask your doctor   You may want to ask these questions: ? Do I need to meet with a diabetes educator? ? What equipment will I need to care for myself at home? ? What medicines do I need? When should I take them? ? How often do I need to check my blood sugar? ? What number can I call if I have questions? ? When is my next doctor's visit? General instructions  Take over-the-counter and prescription medicines only as told by your doctor.  Stay at a healthy weight during pregnancy.  Keep all follow-up visits as told by your doctor. This is important. Contact a doctor if:  Your  blood sugar is at or above 240 mg/dL (13.3 mmol/L).  Your blood sugar is at or above 200 mg/dL (11.1 mmol/L) and you have ketones in your pee (urine).  You have been sick or have had a fever for 2 days or more and you are not getting better.  You have any of these problems for more than 6 hours: ? You cannot eat or drink. ? You feel sick to your stomach (nauseous). ? You throw up (vomit). ? You have watery poop (diarrhea). Get help right away if:  Your blood sugar is lower than 54 mg/dL (3 mmol/L).  You get confused.  You have trouble: ? Thinking clearly. ? Breathing.  Your baby moves less than normal.  You have any of these: ? Moderate or large ketone levels in your pee. ? Blood coming from your vagina. ? Unusual fluid coming from your vagina. ? Early contractions. These may feel like tightness in your belly. Summary  Gestational diabetes is a short-term form of diabetes. It can happen while you are pregnant. It goes away after you give birth.  If gestational diabetes is treated, it may not hurt you or your baby. Your doctor will set treatment goals for you.  Keep all follow-up visits as told by your doctor. This is important. This information is not intended to replace advice given to you by your health care provider. Make sure you discuss any questions you have with your health care provider. Document  Released: 05/05/2015 Document Revised: 02/17/2017 Document Reviewed: 02/14/2015 Elsevier Patient Education  2020 [employer]

## 2018-08-10 LAB — HEMOGLOBIN A1C
Est. average glucose Bld gHb Est-mCnc: 249 mg/dL
Hgb A1c MFr Bld: 10.3 % — ABNORMAL HIGH (ref 4.8–5.6)

## 2018-08-14 MED ORDER — LANTUS SOLOSTAR 100 UNIT/ML ~~LOC~~ SOPN: 15.0000 [IU] | PEN_INJECTOR | Freq: Every day | SUBCUTANEOUS | 99 refills | Status: DC

## 2018-08-16 ENCOUNTER — Telehealth: Payer: Self-pay | Admitting: Family Medicine

## 2018-08-16 ENCOUNTER — Encounter (HOSPITAL_COMMUNITY): Payer: Self-pay | Admitting: *Deleted

## 2018-08-16 ENCOUNTER — Inpatient Hospital Stay (HOSPITAL_COMMUNITY)
Admission: AD | Admit: 2018-08-16 | Discharge: 2018-08-16 | Disposition: A | Discharge status: Home / Self Care | Payer: 59 | Attending: Obstetrics & Gynecology | Admitting: Obstetrics & Gynecology

## 2018-08-16 ENCOUNTER — Inpatient Hospital Stay (HOSPITAL_COMMUNITY): Payer: 59

## 2018-08-16 ENCOUNTER — Other Ambulatory Visit: Payer: Self-pay

## 2018-08-16 DIAGNOSIS — Z8249 Family history of ischemic heart disease and other diseases of the circulatory system: Secondary | ICD-10-CM | POA: Diagnosis not present

## 2018-08-16 DIAGNOSIS — O26891 Other specified pregnancy related conditions, first trimester: Secondary | ICD-10-CM | POA: Insufficient documentation

## 2018-08-16 DIAGNOSIS — Z3A10 10 weeks gestation of pregnancy: Secondary | ICD-10-CM

## 2018-08-16 DIAGNOSIS — O30049 Twin pregnancy, dichorionic/diamniotic, unspecified trimester: Secondary | ICD-10-CM

## 2018-08-16 DIAGNOSIS — Z79899 Other long term (current) drug therapy: Secondary | ICD-10-CM | POA: Insufficient documentation

## 2018-08-16 DIAGNOSIS — O99011 Anemia complicating pregnancy, first trimester: Secondary | ICD-10-CM | POA: Diagnosis not present

## 2018-08-16 DIAGNOSIS — D649 Anemia, unspecified: Secondary | ICD-10-CM | POA: Diagnosis not present

## 2018-08-16 DIAGNOSIS — Z87891 Personal history of nicotine dependence: Secondary | ICD-10-CM | POA: Insufficient documentation

## 2018-08-16 DIAGNOSIS — Z881 Allergy status to other antibiotic agents status: Secondary | ICD-10-CM | POA: Insufficient documentation

## 2018-08-16 DIAGNOSIS — O209 Hemorrhage in early pregnancy, unspecified: Secondary | ICD-10-CM | POA: Diagnosis present

## 2018-08-16 DIAGNOSIS — M545 Low back pain: Secondary | ICD-10-CM | POA: Insufficient documentation

## 2018-08-16 DIAGNOSIS — O24911 Unspecified diabetes mellitus in pregnancy, first trimester: Secondary | ICD-10-CM | POA: Insufficient documentation

## 2018-08-16 DIAGNOSIS — O30041 Twin pregnancy, dichorionic/diamniotic, first trimester: Secondary | ICD-10-CM | POA: Diagnosis not present

## 2018-08-16 DIAGNOSIS — A599 Trichomoniasis, unspecified: Secondary | ICD-10-CM

## 2018-08-16 DIAGNOSIS — O200 Threatened abortion: Secondary | ICD-10-CM | POA: Diagnosis not present

## 2018-08-16 DIAGNOSIS — O4691 Antepartum hemorrhage, unspecified, first trimester: Secondary | ICD-10-CM

## 2018-08-16 DIAGNOSIS — Z882 Allergy status to sulfonamides status: Secondary | ICD-10-CM | POA: Insufficient documentation

## 2018-08-16 DIAGNOSIS — R350 Frequency of micturition: Secondary | ICD-10-CM | POA: Diagnosis not present

## 2018-08-16 DIAGNOSIS — O98311 Other infections with a predominantly sexual mode of transmission complicating pregnancy, first trimester: Secondary | ICD-10-CM | POA: Diagnosis not present

## 2018-08-16 DIAGNOSIS — O4690 Antepartum hemorrhage, unspecified, unspecified trimester: Secondary | ICD-10-CM

## 2018-08-16 DIAGNOSIS — O99611 Diseases of the digestive system complicating pregnancy, first trimester: Secondary | ICD-10-CM

## 2018-08-16 DIAGNOSIS — Z794 Long term (current) use of insulin: Secondary | ICD-10-CM | POA: Insufficient documentation

## 2018-08-16 DIAGNOSIS — Z6790 Unspecified blood type, Rh positive: Secondary | ICD-10-CM

## 2018-08-16 DIAGNOSIS — O360910 Maternal care for other rhesus isoimmunization, first trimester, not applicable or unspecified: Secondary | ICD-10-CM | POA: Diagnosis not present

## 2018-08-16 DIAGNOSIS — R300 Dysuria: Secondary | ICD-10-CM | POA: Insufficient documentation

## 2018-08-16 HISTORY — DX: Bicornate uterus: Q51.3

## 2018-08-16 HISTORY — DX: Anemia, unspecified: D64.9

## 2018-08-16 HISTORY — DX: Unspecified ovarian cyst, unspecified side: N83.209

## 2018-08-16 HISTORY — DX: Anxiety disorder, unspecified: F41.9

## 2018-08-16 HISTORY — DX: Depression, unspecified: F32.A

## 2018-08-16 HISTORY — DX: Type 2 diabetes mellitus without complications: E11.9

## 2018-08-16 LAB — WET PREP, GENITAL
Clue Cells Wet Prep HPF POC: NONE SEEN
Sperm: NONE SEEN
Yeast Wet Prep HPF POC: NONE SEEN

## 2018-08-16 LAB — URINALYSIS, ROUTINE W REFLEX MICROSCOPIC
Bilirubin Urine: NEGATIVE
Glucose, UA: 150 mg/dL — AB
Ketones, ur: NEGATIVE mg/dL
Nitrite: NEGATIVE
Protein, ur: NEGATIVE mg/dL
Specific Gravity, Urine: 1.012 (ref 1.005–1.030)
pH: 6 (ref 5.0–8.0)

## 2018-08-16 LAB — CBC
HCT: 33.1 % — ABNORMAL LOW (ref 36.0–46.0)
Hemoglobin: 10.1 g/dL — ABNORMAL LOW (ref 12.0–15.0)
MCH: 21.6 pg — ABNORMAL LOW (ref 26.0–34.0)
MCHC: 30.5 g/dL (ref 30.0–36.0)
MCV: 70.7 fL — ABNORMAL LOW (ref 80.0–100.0)
Platelets: 434 10*3/uL — ABNORMAL HIGH (ref 150–400)
RBC: 4.68 MIL/uL (ref 3.87–5.11)
RDW: 17.3 % — ABNORMAL HIGH (ref 11.5–15.5)
WBC: 9.6 10*3/uL (ref 4.0–10.5)
nRBC: 0 % (ref 0.0–0.2)

## 2018-08-16 LAB — HCG, QUANTITATIVE, PREGNANCY: hCG, Beta Chain, Quant, S: 45269 m[IU]/mL — ABNORMAL HIGH (ref <5)

## 2018-08-16 MED ORDER — CEPHALEXIN 500 MG PO CAPS: 500.0000 mg | ORAL_CAPSULE | Freq: Four times a day (QID) | ORAL | 0 refills | Status: AC

## 2018-08-16 MED ORDER — FERROUS SULFATE 325 (65 FE) MG PO TABS: 325.0000 mg | ORAL_TABLET | Freq: Every day | ORAL | 1 refills | Status: DC

## 2018-08-16 MED ORDER — METRONIDAZOLE 500 MG PO TABS: 2000.0000 mg | ORAL_TABLET | Freq: Once | ORAL | 0 refills | Status: AC

## 2018-08-16 NOTE — Discharge Instructions (Signed)
Trichomoniasis Trichomoniasis is an STI (sexually transmitted infection) that can affect both women and men. In women, the outer area of the female genitalia (vulva) and the vagina are affected. In men, mainly the penis is affected, but the prostate and other reproductive organs can also be involved.  This condition can be treated with medicine. It often has no symptoms (is asymptomatic), especially in men. If not treated, trichomoniasis can last for months or years. What are the causes? This condition is caused by a parasite called Trichomonas vaginalis. Trichomoniasis most often spreads from person to person (is contagious) through sexual contact. What increases the risk? The following factors may make you more likely to develop this condition:  Having unprotected sex.  Having sex with a partner who has trichomoniasis.  Having multiple sexual partners.  Having had previous trichomoniasis infections or other STIs. What are the signs or symptoms? In women, symptoms of trichomoniasis include:  Abnormal vaginal discharge that is clear, white, gray, or yellow-green and foamy and has an unusual "fishy" odor.  Itching and irritation of the vagina and vulva.  Burning or pain during urination or sex.  Redness and swelling of the genitals. In men, symptoms of trichomoniasis include:  Penile discharge that may be foamy or contain pus.  Pain in the penis. This may happen only when urinating.  Itching or irritation inside the penis.  Burning after urination or ejaculation. How is this diagnosed? In women, this condition may be found during a routine Pap test or physical exam. It may be found in men during a routine physical exam. Your health care provider may do tests to help diagnose this infection, such as:  Urine tests (men and women).  The following in women: ? Testing the pH of the vagina. ? A vaginal swab test that checks for the Trichomonas vaginalis parasite. ? Testing vaginal  secretions. Your health care provider may test you for other STIs, including HIV (human immunodeficiency virus). How is this treated? This condition is treated with medicine taken by mouth (orally), such as metronidazole or tinidazole, to fight the infection. Your sexual partner(s) also need to be tested and treated.  If you are a woman and you plan to become pregnant or think you may be pregnant, tell your health care provider right away. Some medicines that are used to treat the infection should not be taken during pregnancy. Your health care provider may recommend over-the-counter medicines or creams to help relieve itching or irritation. You may be tested for infection again 3 months after treatment. Follow these instructions at home:  Take and use over-the-counter and prescription medicines, including creams, only as told by your health care provider.  Take your antibiotic medicine as told by your health care provider. Do not stop taking the antibiotic even if you start to feel better.  Do not have sex until 7-10 days after you finish your medicine, or until your health care provider approves. Ask your health care provider when you may start to have sex again.  (Women) Do not douche or wear tampons while you have the infection.  Discuss your infection with your sexual partner(s). Make sure that your partner gets tested and treated, if necessary.  Keep all follow-up visits as told by your health care provider. This is important. How is this prevented?   Use condoms every time you have sex. Using condoms correctly and consistently can help protect against STIs.  Avoid having multiple sexual partners.  Talk with your sexual partner about any  symptoms that either of you may have, as well as any history of STIs.  Get tested for STIs and STDs (sexually transmitted diseases) before you have sex. Ask your partner to do the same.  Do not have sexual contact if you have symptoms of  trichomoniasis or another STI. Contact a health care provider if:  You still have symptoms after you finish your medicine.  You develop pain in your abdomen.  You have pain when you urinate.  You have bleeding after sex.  You develop a rash.  You feel nauseous or you vomit.  You plan to become pregnant or think you may be pregnant. Summary  Trichomoniasis is an STI (sexually transmitted infection) that can affect both women and men.  This condition often has no symptoms (is asymptomatic), especially in men.  Without treatment, this condition can last for months or years.  You should not have sex until 7-10 days after you finish your medicine, or until your health care provider approves. Ask your health care provider when you may start to have sex again.  Discuss your infection with your sexual partner(s). Make sure that your partner gets tested and treated, if necessary. This information is not intended to replace advice given to you by your health care provider. Make sure you discuss any questions you have with your health care provider. Document Released: 07/07/2000 Document Revised: 10/25/2017 Document Reviewed: 10/25/2017 Elsevier Patient Education  [place]. Anemia  Anemia is a condition in which you do not have enough red blood cells or hemoglobin. Hemoglobin is a substance in red blood cells that carries oxygen. When you do not have enough red blood cells or hemoglobin (are anemic), your body cannot get enough oxygen and your organs may not work properly. As a result, you may feel very tired or have other problems. What are the causes? Common causes of anemia include:  Excessive bleeding. Anemia can be caused by excessive bleeding inside or outside the body, including bleeding from the intestine or from periods in women.  Poor nutrition.  Long-lasting (chronic) kidney, thyroid, and liver disease.  Bone marrow disorders.  Cancer and treatments for  cancer.  HIV (human immunodeficiency virus) and AIDS (acquired immunodeficiency syndrome).  Treatments for HIV and AIDS.  Spleen problems.  Blood disorders.  Infections, medicines, and autoimmune disorders that destroy red blood cells. What are the signs or symptoms? Symptoms of this condition include:  Minor weakness.  Dizziness.  Headache.  Feeling heartbeats that are irregular or faster than normal (palpitations).  Shortness of breath, especially with exercise.  Paleness.  Cold sensitivity.  Indigestion.  Nausea.  Difficulty sleeping.  Difficulty concentrating. Symptoms may occur suddenly or develop slowly. If your anemia is mild, you may not have symptoms. How is this diagnosed? This condition is diagnosed based on:  Blood tests.  Your medical history.  A physical exam.  Bone marrow biopsy. Your health care provider may also check your stool (feces) for blood and may do additional testing to look for the cause of your bleeding. You may also have other tests, including:  Imaging tests, such as a CT scan or MRI.  Endoscopy.  Colonoscopy. How is this treated? Treatment for this condition depends on the cause. If you continue to lose a lot of blood, you may need to be treated at a hospital. Treatment may include:  Taking supplements of iron, vitamin Z99, or folic acid.  Taking a hormone medicine (erythropoietin) that can help to stimulate red blood cell  growth.  Having a blood transfusion. This may be needed if you lose a lot of blood.  Making changes to your diet.  Having surgery to remove your spleen. Follow these instructions at home:  Take over-the-counter and prescription medicines only as told by your health care provider.  Take supplements only as told by your health care provider.  Follow any diet instructions that you were given.  Keep all follow-up visits as told by your health care provider. This is important. Contact a health care  provider if:  You develop new bleeding anywhere in the body. Get help right away if:  You are very weak.  You are short of breath.  You have pain in your abdomen or chest.  You are dizzy or feel faint.  You have trouble concentrating.  You have bloody or black, tarry stools.  You vomit repeatedly or you vomit up blood. Summary  Anemia is a condition in which you do not have enough red blood cells or enough of a substance in your red blood cells that carries oxygen (hemoglobin).  Symptoms may occur suddenly or develop slowly.  If your anemia is mild, you may not have symptoms.  This condition is diagnosed with blood tests as well as a medical history and physical exam. Other tests may be needed.  Treatment for this condition depends on the cause of the anemia. This information is not intended to replace advice given to you by your health care provider. Make sure you discuss any questions you have with your health care provider. Document Released: 02/19/2004 Document Revised: 12/24/2016 Document Reviewed: 02/13/2016 Elsevier Patient Education  2020 [facility]     Phone: [phone]  [facility] [place] [facility] [place]  Phone: [phone]  [facility] [place] [facility] [place]  Phone: [place] [facility] [place] [facility] [place]                           Phone: [place] [facility] [place] [facility]          Phone: [phone]  [facility]    Phone: [place])    Phone: [facility]    Phone: [phone]  [facility]    Phone: [place]    Phone: [phone]  [facility]  Phone: [place]               Phone:  [phone]  [facility]   Phone: [phone]  [facility]    Phone: [phone]                                  Safe Medications in Pregnancy    Acne: Benzoyl Peroxide Salicylic Acid  Backache/Headache: Tylenol: 2 regular strength every 4 hours OR              2 Extra strength every 6 hours  Colds/Coughs/Allergies: Benadryl (alcohol free) 25 mg every 6 hours as needed Breath right strips Claritin Cepacol throat lozenges Chloraseptic throat spray Cold-Eeze- up to three times per day Cough drops, alcohol free Flonase (by prescription only) Guaifenesin Mucinex Robitussin DM (plain only, alcohol free) Saline nasal spray/drops Sudafed (pseudoephedrine) &  Actifed ** use only after [redacted] weeks gestation and if you do not have high blood pressure Tylenol Vicks Vaporub Zinc lozenges Zyrtec   Constipation: Colace Ducolax suppositories Fleet enema Glycerin suppositories Metamucil Milk of magnesia Miralax Senokot Smooth move tea  Diarrhea: Kaopectate Imodium A-D  *NO pepto Bismol  Hemorrhoids: Anusol Anusol HC Preparation H Tucks  Indigestion: Tums Maalox Mylanta Zantac  Pepcid  Insomnia: Benadryl (alcohol free) 33m every 6 hours as needed Tylenol PM Unisom, no Gelcaps  Leg Cramps: Tums MagGel  Nausea/Vomiting:  Bonine Dramamine Emetrol Ginger extract Sea bands Meclizine  Nausea medication to take during pregnancy:  Unisom (doxylamine succinate 25 mg tablets) Take one tablet daily at bedtime. If symptoms are not adequately controlled, the dose can be increased to a maximum recommended dose of two tablets daily (1/2 tablet in the morning, 1/2 tablet mid-afternoon and one at bedtime). Vitamin B6 1058mtablets. Take one tablet twice a day (up to 200 mg per day).  Skin Rashes: Aveeno products Benadryl cream or 2552mvery 6 hours as needed Calamine Lotion 1% cortisone cream  Yeast  infection: Gyne-lotrimin 7 Monistat 7   **If taking multiple medications, please check labels to avoid duplicating the same active ingredients **take medication as directed on the label ** Do not exceed 4000 mg of tylenol in 24 hours **Do not take medications that contain aspirin or ibuprofen      Threatened Miscarriage  A threatened miscarriage occurs when a woman has vaginal bleeding during the first 20 weeks of pregnancy but the pregnancy has not ended. If you have vaginal bleeding during this time, your health care provider will do tests to make sure you are still pregnant. If the tests show that you are still pregnant and that the developing baby (fetus) inside your uterus is still growing, your condition is considered a threatened miscarriage. A threatened miscarriage does not mean your pregnancy will end, but it does increase the risk of losing your pregnancy (complete miscarriage). What are the causes? The cause of this condition is usually not known. For women who go on to have a complete miscarriage, the most common cause is an abnormal number of chromosomes in the developing baby. Chromosomes are the structures inside cells that hold all of a person's genetic material. What increases the risk? The following lifestyle factors may increase your risk of a miscarriage in early pregnancy:  Smoking.  Drinking excessive amounts of alcohol or caffeine.  Recreational drug use. The following preexisting health conditions may increase your risk of a miscarriage in early pregnancy:  Polycystic ovary syndrome.  Uterine fibroids.  Infections.  Diabetes mellitus. What are the signs or symptoms? Symptoms of this condition include:  Vaginal bleeding.  Mild abdominal pain or cramps. How is this diagnosed? If you have bleeding with or without abdominal pain before 20 weeks of pregnancy, your health care provider will do tests to check whether you are still pregnant. These will  include:  Ultrasound. This test uses sound waves to create images of the inside of your uterus. This allows your health care provider to look at your developing baby and other structures, such as your placenta.  Pelvic exam. This is an internal exam of your vagina and cervix.  Measurement of your baby's heart rate.  Laboratory tests such as blood tests, urine tests, or swabs for infection You may be diagnosed with a threatened miscarriage if:  Ultrasound testing shows that you are still pregnant.  Your babys heart rate is strong.  A pelvic exam shows that the opening between your uterus and your vagina (cervix) is closed.  Blood tests confirm that you are still pregnant. How is this treated? No treatments have been shown to prevent a threatened miscarriage from going on to a complete miscarriage. However, the right home care is important. Follow these instructions at home:  Get plenty of rest.  Do not have sex or use tampons if you have vaginal bleeding.  Do not douche.  Do not smoke or use recreational drugs.  Do not drink alcohol.  Avoid caffeine.  Keep all follow-up prenatal visits as told by your health care provider. This is important. Contact a health care provider if:  You have light vaginal bleeding or spotting while pregnant.  You have abdominal pain or cramping.  You have a fever. Get help right away if:  You have heavy vaginal bleeding.  You have blood clots coming from your vagina.  You pass tissue from your vagina.  You leak fluid, or you have a gush of fluid from your vagina.  You have severe low back pain or abdominal cramps.  You have fever, chills, and severe abdominal pain. Summary  A threatened miscarriage occurs when a woman has vaginal bleeding during the first 20 weeks of pregnancy but the pregnancy has not ended.  The cause of a threatened miscarriage is usually not known.  Symptoms of this condition may include vaginal bleeding and  mild abdominal pain or cramps.  No treatments have been shown to prevent a threatened miscarriage from going on to a complete miscarriage.  Keep all follow-up prenatal visits as told by your health care provider. This is important. This information is not intended to replace advice given to you by your health care provider. Make sure you discuss any questions you have with your health care provider. Document Released: 01/11/2005 Document Revised: 02/17/2017 Document Reviewed: 04/09/2016 Elsevier Patient Education  [place].  Vaginal Bleeding During Pregnancy, First Trimester  A small amount of bleeding from the vagina (spotting) is relatively common during early pregnancy. It usually stops on its own. Various things may cause bleeding or spotting during early pregnancy. Some bleeding may be related to the pregnancy, and some may not. In many cases, the bleeding is normal and is not a problem. However, bleeding can also be a sign of something serious. Be sure to tell your health care provider about any vaginal bleeding right away. Some possible causes of vaginal bleeding during the first trimester include:  Infection or inflammation of the cervix.  Growths (polyps) on the cervix.  Miscarriage or threatened miscarriage.  Pregnancy tissue developing outside of the uterus (ectopic pregnancy).  A mass of tissue developing in the uterus due to an egg being fertilized incorrectly (molar pregnancy). Follow these instructions at home: Activity  Follow instructions from your health care provider about limiting your activity. Ask what activities are safe for you.  If needed, make plans for someone to help with your regular activities.  Do not have sex or orgasms until your health care provider says that this is safe. General instructions  Take over-the-counter and prescription medicines only as told by your health care provider.  Pay attention to any changes in your symptoms.  Do  not use tampons or douche.  Write down how many pads you use each day, how often you change pads, and how soaked (saturated) they are.  If you pass any tissue from your vagina, save the tissue so you can show  it to your health care provider.  Keep all follow-up visits as told by your health care provider. This is important. Contact a health care provider if:  You have vaginal bleeding during any part of your pregnancy.  You have cramps or labor pains.  You have a fever. Get help right away if:  You have severe cramps in your back or abdomen.  You pass large clots or a large amount of tissue from your vagina.  Your bleeding increases.  You feel light-headed or weak, or you faint.  You have chills.  You are leaking fluid or have a gush of fluid from your vagina. Summary  A small amount of bleeding (spotting) from the vagina is relatively common during early pregnancy.  Various things may cause bleeding or spotting in early pregnancy.  Be sure to tell your health care provider about any vaginal bleeding right away. This information is not intended to replace advice given to you by your health care provider. Make sure you discuss any questions you have with your health care provider. Document Released: 10/21/2004 Document Revised: 05/02/2018 Document Reviewed: 04/15/2016 Elsevier Patient Education  2020 [employer].

## 2018-08-16 NOTE — MAU Note (Signed)
Having some bleeding, back is hurting, burning with urination.  Started on Monday.  Pt is preg with twins, has gest diabetes.

## 2018-08-16 NOTE — Telephone Encounter (Signed)
[name] called to say she was having some bleeding, and severe pain. She stated she was a high risk pregnancy, and needed to be seen. I [facility] was alright, then we could get her started with care. She agreed to go there, and give [name] a call when she leaves the hospital.

## 2018-08-16 NOTE — MAU Provider Note (Signed)
History     CSN: [id number]  Arrival date and time: 08/16/18 1030   First Provider Initiated Contact with Patient 08/16/18 1143      Chief Complaint  Patient presents with  . Dysuria  . Back Pain  . Vaginal Bleeding   Ms. Kelli ChurnKhadijah Wilson is a 28 y.o. G4P0030 at 8715w6d who presents to MAU for vaginal bleeding which began Monday. Pt reports every time she wipes she sees pink spotting. Pt also reporting intermittent sharp pains in low back. Pt reports she has also had burning with urination since that time as well. Pt denies frequency or urgency with urination. Pt reports last intercourse one week ago.  Passing blood clots? no Blood soaking clothes? no Lightheaded/dizzy? no Significant pelvic pain or cramping? no Passed any tissue? no Hx ectopic pregnancy? no Hx of PID, GYN surgery? no  IVF this pregnancy? 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 Pre-existing T2DM, di-di twin pregnancy Blood Type? O Positive Allergies? Bactrim, sulfa, numerous foods (see list in chart) Current medications? Folic acid, PNVs Current PNC & next appt? pt has not yet established care  Pt denies vaginal discharge/odor/itching. Pt denies N/V, abdominal pain, constipation, diarrhea, or urinary problems. Pt denies fever, chills, fatigue, sweating or changes in appetite. Pt denies SOB or chest pain. Pt denies dizziness, HA, light-headedness, weakness.   OB History    Gravida  4   Para      Term      Preterm      AB  3   Living  0     SAB  3   TAB      Ectopic      Multiple  0   Live Births              Past Medical History:  Diagnosis Date  . Anemia   . Anxiety   . Bicornate uterus   . Depression    seeing a therapist since 3rd miscarriage, helping, doing good.  . Diabetes mellitus without complication (HCC)    Type 2  . Gout   . Ovarian cyst     Past Surgical  History:  Procedure Laterality Date  . DILATION AND CURETTAGE OF UTERUS    . WISDOM TOOTH EXTRACTION      Family History  Problem Relation Age of Onset  . Cancer Other   . Hypertension Other   . Diabetes Other   . Hypertension Mother   . Gout Mother   . Diabetes Father   . Hypertension Father   . Stroke Father   . Hypertension Maternal Grandfather   . Heart attack Maternal Grandfather   . Stroke Paternal Grandmother   . Hypertension Maternal Aunt   . Cancer Maternal Grandmother        breast  . Diabetes Paternal Grandfather     Social History   Tobacco Use  . Smoking status: Former Games developermoker  . Smokeless tobacco: Never Used  . Tobacco comment: quit 2014  Substance Use Topics  . Alcohol use: No    Frequency: Never    Comment: occas  . Drug use: No    Allergies:  Allergies  Allergen Reactions  . Apple Fruit Extract Hives  . Banana Hives  . Other Hives    Walnuts   . Shellfish Allergy Hives  . Tomato Hives  . Watermelon [Citrullus Vulgaris] Hives  . Bactrim [Sulfamethoxazole-Trimethoprim] Hives and Rash  . Sulfa Antibiotics  Hives and Rash    Medications Prior to Admission  Medication Sig Dispense Refill Last Dose  . Accu-Chek FastClix Lancets MISC USE TO CHECK BLOOD SUGAR FOUR TIMES DAILY AND AS NEEDED     . ACCU-CHEK GUIDE test strip USE 1 STRIP 4 TIMES DAILY AND AS NEEDED     . acetaminophen (TYLENOL) 500 MG tablet Take 500-1,000 mg by mouth every 6 (six) hours as needed for mild pain, moderate pain or headache.      . BD PEN NEEDLE NANO U/F 32G X 4 MM MISC USE ONE NEEDLE DAILY WITH INSULIN INJECTION     . diphenhydrAMINE (BENADRYL) 25 mg capsule Take 25 mg by mouth every 6 (six) hours as needed for allergies (and allergic reactions).      . folic acid (FOLVITE) 1 MG tablet TAKE 4 TABLETS BY MOUTH ONCE DAILY     . Insulin Glargine (LANTUS SOLOSTAR) 100 UNIT/ML Solostar Pen Inject 15 Units into the skin daily. 5 pen PRN   . Prenatal Vit-Fe Fumarate-FA  (MULTIVITAMIN-PRENATAL) 27-0.8 MG TABS tablet Take 1 tablet by mouth daily at 12 noon.       Review of Systems  Constitutional: Negative for chills, diaphoresis, fatigue and fever.  Respiratory: Negative for shortness of breath.   Cardiovascular: Negative for chest pain.  Gastrointestinal: Negative for abdominal pain, constipation, diarrhea, nausea and vomiting.  Genitourinary: Positive for frequency and vaginal bleeding. Negative for dysuria, flank pain, pelvic pain, urgency and vaginal discharge.  Musculoskeletal: Positive for back pain.  Neurological: Negative for dizziness, weakness, light-headedness and headaches.   Physical Exam   Blood pressure (!) 151/96, pulse 76, temperature 98.4 F (36.9 C), temperature source Oral, resp. rate 17, weight 105.9 kg, last menstrual period 02/11/2018, SpO2 100 %, unknown if currently breastfeeding.  Patient Vitals for the past 24 hrs:  BP Temp Temp src Pulse Resp SpO2 Weight  08/16/18 1345 (!) 151/96 - - 76 17 - -  08/16/18 1118 124/76 - - 62 - - -  08/16/18 1050 128/78 98.4 F (36.9 C) Oral 68 18 100 % 105.9 kg   Physical Exam  Constitutional: She is oriented to person, place, and time. She appears well-developed and well-nourished. No distress.  HENT:  Head: Normocephalic and atraumatic.  Respiratory: Effort normal.  GI: Soft. She exhibits no distension and no mass. There is no abdominal tenderness. There is no rebound and no guarding.  Genitourinary: There is no rash, tenderness or lesion on the right labia. There is no rash, tenderness or lesion on the left labia. Uterus is not enlarged and not tender. Cervix exhibits no motion tenderness, no discharge and no friability. Right adnexum displays no mass, no tenderness and no fullness. Left adnexum displays no mass, no tenderness and no fullness.    No vaginal discharge, tenderness or bleeding.  No tenderness or bleeding in the vagina.    Genitourinary Comments: No bleeding visualized, but  swabs were pink after withdrawal from vagina.   Musculoskeletal:     Comments: No back pain on palpation.  Neurological: She is alert and oriented to person, place, and time.  Skin: Skin is warm and dry. She is not diaphoretic.  Psychiatric: She has a normal mood and affect. Her behavior is normal. Judgment and thought content normal.   Results for orders placed or performed during the hospital encounter of 08/16/18 (from the past 24 hour(s))  Urinalysis, Routine w reflex microscopic     Status: Abnormal   Collection Time: 08/16/18 10:45 AM  Result Value Ref Range   Color, Urine YELLOW YELLOW   APPearance HAZY (A) CLEAR   Specific Gravity, Urine 1.012 1.005 - 1.030   pH 6.0 5.0 - 8.0   Glucose, UA 150 (A) NEGATIVE mg/dL   Hgb urine dipstick SMALL (A) NEGATIVE   Bilirubin Urine NEGATIVE NEGATIVE   Ketones, ur NEGATIVE NEGATIVE mg/dL   Protein, ur NEGATIVE NEGATIVE mg/dL   Nitrite NEGATIVE NEGATIVE   Leukocytes,Ua TRACE (A) NEGATIVE   RBC / HPF 0-5 0 - 5 RBC/hpf   WBC, UA 0-5 0 - 5 WBC/hpf   Bacteria, UA RARE (A) NONE SEEN   Squamous Epithelial / LPF 0-5 0 - 5   Mucus PRESENT   CBC     Status: Abnormal   Collection Time: 08/16/18 11:51 AM  Result Value Ref Range   WBC 9.6 4.0 - 10.5 K/uL   RBC 4.68 3.87 - 5.11 MIL/uL   Hemoglobin 10.1 (L) 12.0 - 15.0 g/dL   HCT 40.933.1 (L) 81.136.0 - 91.446.0 %   MCV 70.7 (L) 80.0 - 100.0 fL   MCH 21.6 (L) 26.0 - 34.0 pg   MCHC 30.5 30.0 - 36.0 g/dL   RDW 78.217.3 (H) 95.611.5 - 21.315.5 %   Platelets 434 (H) 150 - 400 K/uL   nRBC 0.0 0.0 - 0.2 %  Wet prep, genital     Status: Abnormal   Collection Time: 08/16/18 11:55 AM   Specimen: Vaginal  Result Value Ref Range   Yeast Wet Prep HPF POC NONE SEEN NONE SEEN   Trich, Wet Prep PRESENT (A) NONE SEEN   Clue Cells Wet Prep HPF POC NONE SEEN NONE SEEN   WBC, Wet Prep HPF POC MANY (A) NONE SEEN   Sperm NONE SEEN    [place] Ob Less Than 14 Weeks With Ob Transvaginal  Result Date: 08/16/2018 CLINICAL DATA:  Bleeding  EXAM: TWIN OBSTETRIC <14WK US AND TRANSVAGINAL OB US COMPARISON:  None. FINDINGS: Number of IUPs:  2 Chorionicity/Amnionicity:  Dichorionic-diamniotic (thick membrane) TWIN 1 Yolk sac:  None Embryo:  Visualized Cardiac Activity: Not visualized Heart Rate:  bpm MSD:   mm    w     d CRL:  23.8 mm   9 w 0 d                  US EDC: 03/21/2019 TWIN 2 Yolk sac:  None Embryo:  Visualized Cardiac Activity: Not visualized Heart Rate:  bpm MSD:   mm    w     d CRL:  5.4 mm   6 w 1 d                  US EDC: 04/10/2019 Subchorionic hemorrhage:  None visualized. Maternal uterus/adnexae: No adnexal mass. IMPRESSION: Dichorionic, diamniotic twin pregnancy. Significant discrepancy in size of the embryos, twin a much larger than twin B. No fetal heart tones detected within twin a concerning for fetal demise. No fetal heart tones detected in twin B. Given the small size of the embryo, this is not yet diagnostic of fetal demise. Recommend repeat ultrasound in 10-14 days to re-evaluate. Electronically Signed   By: Charlett NoseKevin  Dover M.D.   On: 08/16/2018 13:01    MAU Course  Procedures  MDM -r/o ectopic -CBC: H/H 10.1/33.1, platelets 434, will start on iron -US: di-di twins, Twin A - 3115w0d, no cardiac activity, failed pregnancy, Twin B - 5870w1d, no cardiac activity, not yet diagnostic for failed pregnancy, repeat US in 10-14days,  order placed -hCG: pending at time of discharge -ABO: O Positive -WetPrep: +trich/many WBCs, pt declines treatment in MAU, requests RX to be sent to pharmacy -GC/CT collected  -frequency of urination -UA: hazy/150GLU/sm hgb/trace leuks/rare bacteria, sending urine for culture -pt elects to start ABX today, is aware that if urine culture is negative she will not be called with results and may take ABX unnecessarily; pt aware and continues to request RX for ABX today  -pt discharged to home in stable condition  Orders Placed This Encounter  Procedures  . Wet prep, genital    Standing Status:    Standing    Number of Occurrences:   1  . Culture, OB Urine    Standing Status:   Standing    Number of Occurrences:   1  . US OB LESS THAN 14 WEEKS WITH OB TRANSVAGINAL    Standing Status:   Standing    Number of Occurrences:   1    Order Specific Question:   Symptom/Reason for Exam    Answer:   Vaginal bleeding in pregnancy [705036]  . US OB LESS THAN 14 WEEKS WITH OB TRANSVAGINAL    Standing Status:   Future    Standing Expiration Date:   10/17/2019    Scheduling Instructions:     Please schedule patient in one of the follow-up appointment times. Monday - Friday between 7:30 am and 10:30 am or Monday - Thursday between 11:30 am and 3:30 pm.      Patient to [facility] for results following [name]. Please call patient with appointment time.    Order Specific Question:   Reason for Exam (SYMPTOM  OR DIAGNOSIS REQUIRED)    Answer:   viability    Order Specific Question:   Preferred Imaging Location?    Answer:   [facility]  . Urinalysis, Routine w reflex microscopic    Standing Status:   Standing    Number of Occurrences:   1  . CBC    Standing Status:   Standing    Number of Occurrences:   1  . hCG, quantitative, pregnancy    Standing Status:   Standing    Number of Occurrences:   1  . Discharge patient    Order Specific Question:   Discharge disposition    Answer:   01-Home or Self Care [1]    Order Specific Question:   Discharge patient date    Answer:   08/16/2018   Meds ordered this encounter  Medications  . ferrous sulfate 325 (65 FE) MG tablet    Sig: Take 1 tablet (325 mg total) by mouth daily.    Dispense:  30 tablet    Refill:  1    Order Specific Question:   Supervising Provider    Answer:   Adam Phenix [[id number]]  . metroNIDAZOLE (FLAGYL) 500 MG tablet    Sig: Take 4 tablets (2,000 mg total) by mouth once for 1 dose.    Dispense:  4 tablet    Refill:  0    Order Specific Question:   Supervising Provider    Answer:   Adam Phenix [[id number]]  . cephALEXin (KEFLEX)  500 MG capsule    Sig: Take 1 capsule (500 mg total) by mouth 4 (four) times daily for 7 days.    Dispense:  28 capsule    Refill:  0    Order Specific Question:   Supervising Provider    Answer:   Adam Phenix [  [id number]]   Assessment and Plan   1. Dichorionic diamniotic twin pregnancy in first trimester   2. Vaginal bleeding in pregnancy   3. Threatened miscarriage   4. Blood type, Rh positive   5. Trichomoniasis   6. Anemia affecting pregnancy in first trimester   7. Urinary frequency    Allergies as of 08/16/2018      Reactions   Apple Fruit Extract Hives   Banana Hives   Other Hives   Walnuts   Shellfish Allergy Hives   Tomato Hives   Watermelon [citrullus Vulgaris] Hives   Bactrim [sulfamethoxazole-trimethoprim] Hives, Rash   Sulfa Antibiotics Hives, Rash      Medication List    TAKE these medications   Accu-Chek FastClix Lancets Misc USE TO CHECK BLOOD SUGAR FOUR TIMES DAILY AND AS NEEDED   Accu-Chek Guide test strip Generic drug: glucose blood USE 1 STRIP 4 TIMES DAILY AND AS NEEDED   acetaminophen 500 MG tablet Commonly known as: TYLENOL Take 500-1,000 mg by mouth every 6 (six) hours as needed for mild pain, moderate pain or headache.   BD Pen Needle Nano U/F 32G X 4 MM Misc Generic drug: Insulin Pen Needle USE ONE NEEDLE DAILY WITH INSULIN INJECTION   cephALEXin 500 MG capsule Commonly known as: KEFLEX Take 1 capsule (500 mg total) by mouth 4 (four) times daily for 7 days.   diphenhydrAMINE 25 mg capsule Commonly known as: BENADRYL Take 25 mg by mouth every 6 (six) hours as needed for allergies (and allergic reactions).   ferrous sulfate 325 (65 FE) MG tablet Take 1 tablet (325 mg total) by mouth daily.   folic acid 1 MG tablet Commonly known as: FOLVITE TAKE 4 TABLETS BY MOUTH ONCE DAILY   Lantus SoloStar 100 UNIT/ML Solostar Pen Generic drug: Insulin Glargine Inject 15 Units into the skin daily.   metroNIDAZOLE 500 MG tablet Commonly known  as: Flagyl Take 4 tablets (2,000 mg total) by mouth once for 1 dose.   multivitamin-prenatal 27-0.8 MG Tabs tablet Take 1 tablet by mouth daily at 12 noon.      -will call with culture results, if positive -RX for ferrous sulfate, discussed administration and foods to avoid consuming simultaneously to optimal absorption, safe meds to take in pregnancy given to treat potential side effect of constipation, side effects of iron administration discussed -will receive phone call for f/u US to be scheduled in 10-14days -RX for metronidazole for trich, expedited partner therapy RX given, avoid intercourse from today until 7days after both you and your partner have taken the medication for treatment, TOC recommended in 3months -strict VB/pain/hyperemesis/return MAU precautions given -pt discharged to home in stable condition  Joni Reiningicole E Kaveri Perras 08/16/2018, 1:54 PM

## 2018-08-17 ENCOUNTER — Telehealth: Payer: Self-pay

## 2018-08-17 LAB — GC/CHLAMYDIA PROBE AMP (~~LOC~~) NOT AT ARMC
Chlamydia: NEGATIVE
Neisseria Gonorrhea: NEGATIVE

## 2018-08-17 LAB — CULTURE, OB URINE: Culture: 10000 — AB

## 2018-08-17 NOTE — Telephone Encounter (Signed)
Good Afternoon,  We just received a call from the hospital stating that patient had a positive Group B Strep result. Patient's PCP is no longer working at this office so I wanted to give her OBGYN a heads up so that she will receive proper treatment.  Thanks.

## 2018-08-18 ENCOUNTER — Other Ambulatory Visit: Payer: Self-pay | Admitting: Student

## 2018-08-18 ENCOUNTER — Telehealth: Payer: Self-pay | Admitting: Student

## 2018-08-18 MED ORDER — AMOXICILLIN 875 MG PO TABS: 875.0000 mg | ORAL_TABLET | Freq: Two times a day (BID) | ORAL | 0 refills | Status: DC

## 2018-08-18 NOTE — Telephone Encounter (Signed)
Left vm asking patient to call MAU so we can talk about her urine culture results. Gave patient phone number for MAU.  Nina Phillips

## 2018-08-18 NOTE — Telephone Encounter (Signed)
Left second voicemail for patient regarding need to speak with her about urine culture results. Gave MAU number and told her to call us anytime and that medicine was at the pharmacy for her.   Nina Phillips

## 2018-08-20 ENCOUNTER — Encounter: Payer: Self-pay | Admitting: Women's Health

## 2018-08-20 DIAGNOSIS — R8271 Bacteriuria: Secondary | ICD-10-CM | POA: Insufficient documentation

## 2018-08-22 ENCOUNTER — Inpatient Hospital Stay (HOSPITAL_COMMUNITY)
Admission: AD | Admit: 2018-08-22 | Discharge: 2018-08-22 | Discharge status: Against Medical Advice | Payer: 59 | Attending: Obstetrics and Gynecology | Admitting: Obstetrics and Gynecology

## 2018-08-22 ENCOUNTER — Other Ambulatory Visit: Payer: Self-pay

## 2018-08-22 ENCOUNTER — Inpatient Hospital Stay (HOSPITAL_COMMUNITY): Admission: RE | Admit: 2018-08-22 | Payer: 59 | Source: Ambulatory Visit

## 2018-08-22 DIAGNOSIS — R1030 Lower abdominal pain, unspecified: Secondary | ICD-10-CM | POA: Diagnosis not present

## 2018-08-22 DIAGNOSIS — Z3A11 11 weeks gestation of pregnancy: Secondary | ICD-10-CM | POA: Diagnosis not present

## 2018-08-22 DIAGNOSIS — Z5321 Procedure and treatment not carried out due to patient leaving prior to being seen by health care provider: Secondary | ICD-10-CM | POA: Insufficient documentation

## 2018-08-22 DIAGNOSIS — O209 Hemorrhage in early pregnancy, unspecified: Secondary | ICD-10-CM

## 2018-08-22 DIAGNOSIS — O26891 Other specified pregnancy related conditions, first trimester: Secondary | ICD-10-CM | POA: Diagnosis not present

## 2018-08-22 DIAGNOSIS — O30001 Twin pregnancy, unspecified number of placenta and unspecified number of amniotic sacs, first trimester: Secondary | ICD-10-CM

## 2018-08-22 LAB — URINALYSIS, ROUTINE W REFLEX MICROSCOPIC
Bacteria, UA: NONE SEEN
Bilirubin Urine: NEGATIVE
Glucose, UA: NEGATIVE mg/dL
Ketones, ur: NEGATIVE mg/dL
Leukocytes,Ua: NEGATIVE
Nitrite: NEGATIVE
Protein, ur: 30 mg/dL — AB
Specific Gravity, Urine: 1.015 (ref 1.005–1.030)
pH: 6 (ref 5.0–8.0)

## 2018-08-22 NOTE — MAU Note (Signed)
Pt told registration that she could not sit in the lobby any longer.

## 2018-08-22 NOTE — MAU Note (Signed)
.   Nina Phillips is a 28 y.o. at [redacted]w[redacted]d here in MAU reporting: lower abdominal cramping with scant amount of  Bleeding when she wipes.. PT did have a twin gestation with one without a heart rate. States the other twin has not been growing appropriately.  LMP: 02/11/18 Onset of complaint: weeks Pain score: 8  Vitals:   08/22/18 1245  BP: (!) 130/55  Pulse: 61  Resp: 18  Temp: 98.2 F (36.8 C)      Lab orders placed from triage: UA

## 2018-08-24 ENCOUNTER — Inpatient Hospital Stay (HOSPITAL_COMMUNITY)
Admission: AD | Admit: 2018-08-24 | Discharge: 2018-08-24 | Disposition: A | Discharge status: Home / Self Care | Payer: 59 | Attending: Obstetrics and Gynecology | Admitting: Obstetrics and Gynecology

## 2018-08-24 ENCOUNTER — Inpatient Hospital Stay (HOSPITAL_COMMUNITY): Payer: 59

## 2018-08-24 ENCOUNTER — Encounter (HOSPITAL_COMMUNITY): Payer: Self-pay | Admitting: *Deleted

## 2018-08-24 ENCOUNTER — Other Ambulatory Visit: Payer: Self-pay

## 2018-08-24 DIAGNOSIS — O3680X0 Pregnancy with inconclusive fetal viability, not applicable or unspecified: Secondary | ICD-10-CM

## 2018-08-24 DIAGNOSIS — A599 Trichomoniasis, unspecified: Secondary | ICD-10-CM

## 2018-08-24 DIAGNOSIS — O30001 Twin pregnancy, unspecified number of placenta and unspecified number of amniotic sacs, first trimester: Secondary | ICD-10-CM | POA: Diagnosis not present

## 2018-08-24 DIAGNOSIS — O021 Missed abortion: Secondary | ICD-10-CM | POA: Insufficient documentation

## 2018-08-24 DIAGNOSIS — Z87891 Personal history of nicotine dependence: Secondary | ICD-10-CM | POA: Diagnosis not present

## 2018-08-24 DIAGNOSIS — O209 Hemorrhage in early pregnancy, unspecified: Secondary | ICD-10-CM | POA: Diagnosis present

## 2018-08-24 DIAGNOSIS — O30041 Twin pregnancy, dichorionic/diamniotic, first trimester: Secondary | ICD-10-CM | POA: Insufficient documentation

## 2018-08-24 DIAGNOSIS — Z3A12 12 weeks gestation of pregnancy: Secondary | ICD-10-CM | POA: Insufficient documentation

## 2018-08-24 DIAGNOSIS — R1032 Left lower quadrant pain: Secondary | ICD-10-CM | POA: Diagnosis not present

## 2018-08-24 DIAGNOSIS — M545 Low back pain: Secondary | ICD-10-CM | POA: Diagnosis not present

## 2018-08-24 DIAGNOSIS — O0289 Other abnormal products of conception: Secondary | ICD-10-CM

## 2018-08-24 DIAGNOSIS — O30049 Twin pregnancy, dichorionic/diamniotic, unspecified trimester: Secondary | ICD-10-CM

## 2018-08-24 HISTORY — DX: Trichomoniasis, unspecified: A59.9

## 2018-08-24 LAB — CBC WITH DIFFERENTIAL/PLATELET
Abs Immature Granulocytes: 0.02 10*3/uL (ref 0.00–0.07)
Basophils Absolute: 0.1 10*3/uL (ref 0.0–0.1)
Basophils Relative: 1 %
Eosinophils Absolute: 0.2 10*3/uL (ref 0.0–0.5)
Eosinophils Relative: 2 %
HCT: 30.7 % — ABNORMAL LOW (ref 36.0–46.0)
Hemoglobin: 9.5 g/dL — ABNORMAL LOW (ref 12.0–15.0)
Immature Granulocytes: 0 %
Lymphocytes Relative: 31 %
Lymphs Abs: 2.9 10*3/uL (ref 0.7–4.0)
MCH: 21.7 pg — ABNORMAL LOW (ref 26.0–34.0)
MCHC: 30.9 g/dL (ref 30.0–36.0)
MCV: 70.1 fL — ABNORMAL LOW (ref 80.0–100.0)
Monocytes Absolute: 0.8 10*3/uL (ref 0.1–1.0)
Monocytes Relative: 8 %
Neutro Abs: 5.4 10*3/uL (ref 1.7–7.7)
Neutrophils Relative %: 58 %
Platelets: 364 10*3/uL (ref 150–400)
RBC: 4.38 MIL/uL (ref 3.87–5.11)
RDW: 17.9 % — ABNORMAL HIGH (ref 11.5–15.5)
WBC: 9.3 10*3/uL (ref 4.0–10.5)
nRBC: 0 % (ref 0.0–0.2)

## 2018-08-24 LAB — URINALYSIS, ROUTINE W REFLEX MICROSCOPIC
Bilirubin Urine: NEGATIVE
Glucose, UA: NEGATIVE mg/dL
Ketones, ur: NEGATIVE mg/dL
Nitrite: NEGATIVE
Protein, ur: 30 mg/dL — AB
Specific Gravity, Urine: 1.016 (ref 1.005–1.030)
pH: 6 (ref 5.0–8.0)

## 2018-08-24 LAB — HCG, QUANTITATIVE, PREGNANCY: hCG, Beta Chain, Quant, S: 14116 m[IU]/mL — ABNORMAL HIGH (ref <5)

## 2018-08-24 MED ORDER — HYDROCODONE-ACETAMINOPHEN 5-325 MG PO TABS: 1.0000 | ORAL_TABLET | ORAL | 0 refills | Status: DC | PRN

## 2018-08-24 MED ORDER — MISOPROSTOL 200 MCG PO TABS: ORAL_TABLET | ORAL | 1 refills | Status: DC

## 2018-08-24 MED ORDER — PROMETHAZINE HCL 25 MG PO TABS: 12.5000 mg | ORAL_TABLET | ORAL | 0 refills | Status: DC | PRN

## 2018-08-24 MED ORDER — IBUPROFEN 600 MG PO TABS: 600.0000 mg | ORAL_TABLET | Freq: Four times a day (QID) | ORAL | 3 refills | Status: DC | PRN

## 2018-08-24 NOTE — MAU Note (Signed)
Is still bleeding and cramping, has not passed any tissue or nothing.  Twin preg, one had stopped growing a few wks ago, the other no longer has a heart beat.

## 2018-08-24 NOTE — MAU Note (Signed)
Came in today to get something to help her, since she hasn't passed it yet. Concerned that still carrying the dead babies could cause a problem.

## 2018-08-24 NOTE — Discharge Instructions (Signed)
FACTS YOU SHOULD KNOW  WHAT IS AN EARLY PREGNANCY FAILURE? Once the egg is fertilized with the sperm and begins to develop, it attaches to the lining of the uterus. This early pregnancy tissue may not develop into an embryo (the beginning stage of a baby). Sometimes an embryo does develop but does not continue to grow. These problems can be seen on ultrasound.   MANAGEMNT OF EARLY PREGNANCY FAILURE: About 4 out of 100 (0.25%) women will have a pregnancy loss in her lifetime.  One in five pregnancies is found to be an early pregnancy failure.  There are 3 ways to care for an early pregnancy failure:   (1) Surgery, (2) Medicine, (3) Waiting for you to pass the pregnancy on your own. The decision as to how to proceed after being diagnosed with and early pregnancy failure is an individual one.  The decision can be made only after appropriate counseling.  You need to weigh the pros and cons of the 3 choices. Then you can make the choice that works for you. SURGERY (D&E) . Procedure over in 1 day . Requires being put to sleep . Bleeding may be light . Possible problems during surgery, including injury to womb(uterus) . Care provider has more control Medicine (CYTOTEC) . The complete procedure may take days to weeks . No Surgery . Bleeding may be heavy at times . There may be drug side effects . Patient has more control Waiting . You may choose to wait, in which case your own body may complete the passing of the abnormal early pregnancy on its own in about 2-4 weeks . Your bleeding may be heavy at times . There is a small possibility that you may need surgery if the bleeding is too much or not all of the pregnancy has passed. CYTOTEC MANAGEMENT Prostaglandins (cytotec) are the most widely used drug for this purpose. They cause the uterus to cramp and contract. You will place the medicine yourself inside your vagina in the privacy of your home. Empting of the uterus should occur within 3 days but  the process may continue for several weeks. The bleeding may seem heavy at times. POSSIBLE SIDE EFFECTS FROM CYTOTEC . Nausea   Vomiting . Diarrhea Fever . Chills  Hot Flashes Side effects  from the process of the early pregnancy failure include: . Cramping  Bleeding . Headaches  Dizziness RISKS: This is a low risk procedure. Less than 1 in 100 women has a complication. An incomplete passage of the early pregnancy may occur. Also, Hemorrhage (heavy bleeding) could happen.  Rarely the pregnancy will not be passed completely. Excessively heavy bleeding may occur.  Your doctor may need to perform surgery to empty the uterus (D&E). Afterwards: Everybody will feel differently after the early pregnancy completion. You may have soreness or cramps for a day or two. You may have soreness or cramps for day or two.  You may have light bleeding for up to 2 weeks. You may be as active as you feel like being. If you have any of the following problems you may call Maternity Admissions Unit at [phone]. . If you have pain that does not get better  with pain medication . Bleeding that soaks through 2 thick full-sized sanitary pads in an hour . Cramps that last longer than 2 days . Foul smelling discharge . Fever above 100.4 degrees F Even if you do not have any of these symptoms, you should have a follow-up exam to make sure you   are healing properly. This appointment will be made for you before you leave the hospital. Your next normal period will start again in 4-6 week after the loss. You can get pregnant soon after the loss, so use birth control right away. Finally: Make sure all your questions are answered before during and after any procedure. Follow up with medical care and family planning methods.    Miscarriage A miscarriage is the loss of an unborn baby (fetus) before the 20th week of pregnancy. Follow these instructions at home: Medicines   Take over-the-counter and prescription medicines  only as told by your doctor.  If you were prescribed antibiotic medicine, take it as told by your doctor. Do not stop taking the antibiotic even if you start to feel better.  Do not take NSAIDs unless your doctor says that this is safe for you. NSAIDs include aspirin and ibuprofen. These medicines can cause bleeding. Activity  Rest as directed. Ask your doctor what activities are safe for you.  Have someone help you at home during this time. General instructions  Write down how many pads you use each day and how soaked they are.  Watch the amount of tissue or clumps of blood (blood clots) that you pass from your vagina. Save any large amounts of tissue for your doctor.  Do not use tampons, douche, or have sex until your doctor approves.  To help you and your partner with the process of grieving, talk with your doctor or seek counseling.  When you are ready, meet with your doctor to talk about steps you should take for your health. Also, talk with your doctor about steps to take to have a healthy pregnancy in the future.  Keep all follow-up visits as told by your doctor. This is important. Contact a doctor if:  You have a fever or chills.  You have vaginal discharge that smells bad.  You have more bleeding. Get help right away if:  You have very bad cramps or pain in your back or belly.  You pass clumps of blood that are walnut-sized or larger from your vagina.  You pass tissue that is walnut-sized or larger from your vagina.  You soak more than 1 regular pad in an hour.  You get light-headed or weak.  You faint (pass out).  You have feelings of sadness that do not go away, or you have thoughts of hurting yourself. Summary  A miscarriage is the loss of an unborn baby before the 20th week of pregnancy.  Follow your doctor's instructions for home care. Keep all follow-up appointments.  To help you and your partner with the process of grieving, talk with your doctor or  seek counseling. This information is not intended to replace advice given to you by your health care provider. Make sure you discuss any questions you have with your health care provider. Document Released: 04/05/2011 Document Revised: 05/05/2018 Document Reviewed: 02/17/2016 Elsevier Patient Education  2020 [employer].

## 2018-08-24 NOTE — MAU Provider Note (Signed)
History     CSN: [id number]  Arrival date and time: 08/24/18 5465   First Provider Initiated Contact with Patient 08/24/18 0800      Chief Complaint  Patient presents with  . Vaginal Bleeding  . Abdominal Pain   HPI Nina Phillips is a 28 y.o. G4P0030 at [redacted]w[redacted]d with di-di twin gestation who presents to MAU with chief complaints of vaginal bleeding, LLQ abdominal pain and lower back pain. Her history is significant for new diagnosis of T2DM and multiple pregnancy losses.  Vaginal Bleeding This is a recurring problem, onset 08/14/18. Patient was seen in MAU 08/16/18 for this complaint and diagnosed with pregnancy of unknown viability. She states her vaginal bleeding has waxed and waned since that time, today just light spotting  Low back pain This is a recurrent problem, onset with vaginal bleeding on 08/14/18. Patient describes pain as "crampy". Patient does not radiate. She denies aggravating or alleviating factors. She has not taken medication or tried other treatments for this complaint.  LLQ Abdominal Pain New onset 07/25. Patient endorses LLQ abdominal pain which waxes and wanes throughout the day. She denies dysuria, abdominal tenderness, abnormal vaginal discharge.  Trichomonas Patient endorses previous diagnosis. States she took prescribed treatment yesterday 08/23/18. Endorses simultaneous partner treatment.  Diabetes Patient has new diagnosis of T2DM. She is being managed by her PCP and is waiting for referral to Endocrinology.  Patient has not eaten since 10pm last night. Her father is her support person today. She declines pain medicine upon CNM introduction in MAU.  OB History    Gravida  4   Para      Term      Preterm      AB  3   Living  0     SAB  3   TAB      Ectopic      Multiple  0   Live Births             Patient Active Problem List   Diagnosis Date Noted  . GBS bacteriuria 08/20/2018  . Dichorionic diamniotic twin gestation  08/16/2018  . New onset type 2 diabetes mellitus ([place]) 07/17/2018  . Hyperglycemia 07/17/2018  . 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 Anemia   . Anxiety   . Bicornate uterus   . Depression    seeing a therapist since 3rd miscarriage, helping, doing good.  . Diabetes mellitus without complication (HCC)    Type 2  . Gout   . Ovarian cyst     Past Surgical History:  Procedure Laterality Date  . DILATION AND CURETTAGE OF UTERUS    . WISDOM TOOTH EXTRACTION      Family History  Problem Relation Age of Onset  . Cancer Other   . Hypertension Other   . Diabetes Other   . Hypertension Mother   . Gout Mother   . Diabetes Father   . Hypertension Father   . Stroke Father   . Hypertension Maternal Grandfather   . Heart attack Maternal Grandfather   . Stroke Paternal Grandmother   . Hypertension Maternal Aunt   . Cancer Maternal Grandmother        breast  . Diabetes Paternal Grandfather     Social History   Tobacco Use  . Smoking status: Former Research scientist (life sciences)  . Smokeless tobacco: Never Used  . Tobacco comment: quit 2014  Substance Use Topics  . Alcohol use: No    Frequency: Never    Comment: occas  . Drug use: No    Allergies:  Allergies  Allergen Reactions  . Apple Fruit Extract Hives  . Banana Hives  . Other Hives    Walnuts   . Shellfish Allergy Hives  . Tomato Hives  . Watermelon [Citrullus Vulgaris] Hives  . Bactrim [Sulfamethoxazole-Trimethoprim] Hives and Rash  . Sulfa Antibiotics Hives and Rash    Medications Prior to Admission  Medication Sig Dispense Refill Last Dose  . Accu-Chek FastClix Lancets MISC USE TO CHECK BLOOD SUGAR FOUR TIMES DAILY AND AS NEEDED     . ACCU-CHEK GUIDE test strip USE 1 STRIP 4 TIMES DAILY AND AS NEEDED     . acetaminophen (TYLENOL) 500 MG tablet Take 500-1,000 mg by mouth every 6 (six) hours as needed for mild  pain, moderate pain or headache.      Marland Kitchen. amoxicillin (AMOXIL) 875 MG tablet Take 1 tablet (875 mg total) by mouth 2 (two) times daily. 14 tablet 0   . BD PEN NEEDLE NANO U/F 32G X 4 MM MISC USE ONE NEEDLE DAILY WITH INSULIN INJECTION     . diphenhydrAMINE (BENADRYL) 25 mg capsule Take 25 mg by mouth every 6 (six) hours as needed for allergies (and allergic reactions).      . ferrous sulfate 325 (65 FE) MG tablet Take 1 tablet (325 mg total) by mouth daily. 30 tablet 1   . folic acid (FOLVITE) 1 MG tablet TAKE 4 TABLETS BY MOUTH ONCE DAILY     . Insulin Glargine (LANTUS SOLOSTAR) 100 UNIT/ML Solostar Pen Inject 15 Units into the skin daily. 5 pen PRN   . Prenatal Vit-Fe Fumarate-FA (MULTIVITAMIN-PRENATAL) 27-0.8 MG TABS tablet Take 1 tablet by mouth daily at 12 noon.       Review of Systems  Constitutional: Negative for chills and fever.  Respiratory: Negative for shortness of breath.   Gastrointestinal: Positive for abdominal pain. Negative for nausea and vomiting.  Endocrine: Negative for polydipsia, polyphagia and polyuria.  Genitourinary: Positive for vaginal bleeding. Negative for difficulty urinating, dysuria, flank pain, vaginal discharge and vaginal pain.  Musculoskeletal: Positive for back pain.  Neurological: Negative for dizziness, syncope and weakness.  All other systems reviewed and are negative.  Physical Exam   Blood pressure 110/77, pulse 70, temperature 98.1 F (36.7 C), temperature source Oral, resp. rate 18, height 5\' 4"  (1.626 m), weight 109.8 kg, last menstrual period 02/11/2018, SpO2 100 %, unknown if currently breastfeeding.  Physical Exam  Nursing note and vitals reviewed. Constitutional: She is oriented to person, place, and time. She appears well-developed and well-nourished.  Cardiovascular: Normal rate.  Respiratory: Effort normal.  GI: Soft. She exhibits no distension. There is no abdominal tenderness. There is no rebound and no guarding.  Genitourinary:     Genitourinary Comments: Deferred   Musculoskeletal: Normal range of motion.  Neurological: She is alert and oriented to person, place, and time.  Skin: Skin is warm and dry.  Psychiatric: She has a normal mood and affect. Her behavior is normal. Judgment and thought content normal.    MAU Course/MDM  Procedures  --Hx significant for multiple early losses. S/p management with Cytotec 03/2017, D&E 10/2017 --Hb 9.5 today. 10.1 at baseline 8 days ago. --Hgb A1C 10.3 on 08/09/18 --Consult with Dr. Alysia PennaErvin who confirms patient is appropriate candidate for Cytotec. Also advises patient make every effort to avoid pregnancy for minimum of six  months  --Quant decreasing [id number] on 07/22, 14,116 today  Patient Vitals for the past 24 hrs:  BP Temp Temp src Pulse Resp SpO2 Height Weight  08/24/18 1043 (!) 127/58 - - 62 - - - -  08/24/18 0759 110/77 - - 70 - - - -  08/24/18 0730 121/77 98.1 F (36.7 C) Oral 64 18 100 % 5\' 4"  (1.626 m) 109.8 kg   Results for orders placed or performed during the hospital encounter of 08/24/18 (from the past 24 hour(s))  Urinalysis, Routine w reflex microscopic     Status: Abnormal   Collection Time: 08/24/18  8:23 AM  Result Value Ref Range   Color, Urine YELLOW YELLOW   APPearance HAZY (A) CLEAR   Specific Gravity, Urine 1.016 1.005 - 1.030   pH 6.0 5.0 - 8.0   Glucose, UA NEGATIVE NEGATIVE mg/dL   Hgb urine dipstick SMALL (A) NEGATIVE   Bilirubin Urine NEGATIVE NEGATIVE   Ketones, ur NEGATIVE NEGATIVE mg/dL   Protein, ur 30 (A) NEGATIVE mg/dL   Nitrite NEGATIVE NEGATIVE   Leukocytes,Ua SMALL (A) NEGATIVE   RBC / HPF 0-5 0 - 5 RBC/hpf   WBC, UA 11-20 0 - 5 WBC/hpf   Bacteria, UA RARE (A) NONE SEEN   Squamous Epithelial / LPF 6-10 0 - 5   Trichomonas, UA PRESENT (A) NONE SEEN  CBC with Differential/Platelet     Status: Abnormal   Collection Time: 08/24/18  8:25 AM  Result Value Ref Range   WBC 9.3 4.0 - 10.5 K/uL   RBC 4.38 3.87 - 5.11 MIL/uL    Hemoglobin 9.5 (L) 12.0 - 15.0 g/dL   HCT 40.930.7 (L) 81.136.0 - 91.446.0 %   MCV 70.1 (L) 80.0 - 100.0 fL   MCH 21.7 (L) 26.0 - 34.0 pg   MCHC 30.9 30.0 - 36.0 g/dL   RDW 78.217.9 (H) 95.611.5 - 21.315.5 %   Platelets 364 150 - 400 K/uL   nRBC 0.0 0.0 - 0.2 %   Neutrophils Relative % 58 %   Neutro Abs 5.4 1.7 - 7.7 K/uL   Lymphocytes Relative 31 %   Lymphs Abs 2.9 0.7 - 4.0 K/uL   Monocytes Relative 8 %   Monocytes Absolute 0.8 0.1 - 1.0 K/uL   Eosinophils Relative 2 %   Eosinophils Absolute 0.2 0.0 - 0.5 K/uL   Basophils Relative 1 %   Basophils Absolute 0.1 0.0 - 0.1 K/uL   Immature Granulocytes 0 %   Abs Immature Granulocytes 0.02 0.00 - 0.07 K/uL  hCG, quantitative, pregnancy     Status: Abnormal   Collection Time: 08/24/18  8:25 AM  Result Value Ref Range   hCG, Beta Chain, Quant, S 14,116 (H) <5 mIU/mL   [place] Ob Transvaginal  Result Date: 08/24/2018 CLINICAL DATA:  Left lower quadrant pain for several days. Vaginal bleeding for 2 weeks. First trimester twin pregnancy with inconclusive fetal viability. EXAM: US OB TRANSVAGINAL COMPARISON:  11/10/2017 FINDINGS: Number of IUPs:  2 Chorionicity/Amnionicity:  Dichorionic-diamniotic (thick membrane) TWIN 1 Yolk sac:  Not Visualized. Embryo:  Visualized. Cardiac Activity: Not Visualized. CRL:  20 mm   8 w 4 d                  US EDC: 04/01/2019 TWIN 2 Yolk sac:  Not Visualized. Embryo:  Not Visualized. MSD: Previously seen yolk sac and embryo no longer visualized Subchorionic hemorrhage:  None visualized. Maternal uterus/adnexae: Both ovaries are normal appearance. No mass or abnormal free  fluid identified. IMPRESSION: Failed dichorionic twin IUP. This follows SRU consensus guidelines: Diagnostic Criteria for Nonviable Pregnancy Early in the First Trimester. Macy Mis Engl J Med [phone];369:1443-51. Electronically Signed   By: Danae OrleansJohn A Stahl M.D.   On: 08/24/2018 09:47    Early Intrauterine Pregnancy Failure Treatment with Misoprostol  X  Documented intrauterine pregnancy  failure less than or equal to [redacted] weeks gestation  X  No serious current illness  X  Baseline Hgb greater than or equal to 10g/dl  X  Patient has easily accessible transportation to the hospital  X  Clear preference  X  Practitioner/physician deems patient reliable  X  Counseling by practitioner or physician  X  Patient education by RN  X  Consent form signed  X  Rho-Gam not indicated  X  Medication dispensed  ___  Cytotec 800 mcg (PV by patient at home)   ___  Ibuprofen 600 mg 1 tablet by mouth every 6 hours as needed #30  ___  Hydrocodone/acetaminophen 5/325 mg by mouth every 4 to 6 hours as needed #15  ___  Phenergan 12.5 mg by mouth every 4 hours as needed for nausea #30  Risks and benefits of medical management were carefully explained, including a success rate of 80-90%, the need for another person to be at home with her, and to call/come in if she had heavy bleeding, dizziness, or severe pain not relieved by medication.  She will follow up in one week after misoprostol administration; Patient should again be advised to call or come in for evaluation for any concerning symptoms; bleeding precautions should be strictly emphasized.   Meds ordered this encounter  Medications  . ibuprofen (ADVIL) 600 MG tablet    Sig: Take 1 tablet (600 mg total) by mouth every 6 (six) hours as needed.    Dispense:  60 tablet    Refill:  3    Order Specific Question:   Supervising Provider    Answer:   ERVIN, MICHAEL L [[id number]]  . promethazine (PHENERGAN) 25 MG tablet    Sig: Take 0.5 tablets (12.5 mg total) by mouth every 4 (four) hours as needed for nausea or vomiting.    Dispense:  30 tablet    Refill:  0    Order Specific Question:   Supervising Provider    Answer:   ERVIN, MICHAEL L [[id number]]  . HYDROcodone-acetaminophen (NORCO/VICODIN) 5-325 MG tablet    Sig: Take 1-2 tablets by mouth every 4 (four) hours as needed for up to 15 doses.    Dispense:  6 tablet    Refill:  0    Pregnancy  failure    Order Specific Question:   Supervising Provider    Answer:   ERVIN, MICHAEL L [[id number]]  . misoprostol (CYTOTEC) 200 MCG tablet    Sig: Place four tablets between your gum and cheeks (two tablets on each side) as instructed OR insert four tablets vaginally at bedtime    Dispense:  4 tablet    Refill:  1    Order Specific Question:   Supervising Provider    Answer:   Nettie ElmERVIN, MICHAEL L [[id number]]    Assessment and Plan  --28 y.o. G4P0030 with failed di-di twin pregnancy --Cytotec and supportive medications as described above --Discharge home in stable condition with bleeding precautions  [facility] about two weeks for Provider visit to discuss miscarriage hx, contraception  Calvert CantorSamantha C Weinhold, CNM 08/24/2018, 11:23 AM

## 2018-08-29 ENCOUNTER — Encounter: Payer: Self-pay | Admitting: Internal Medicine

## 2018-09-07 ENCOUNTER — Encounter: Payer: Self-pay | Admitting: Internal Medicine

## 2018-09-07 ENCOUNTER — Other Ambulatory Visit: Payer: Self-pay

## 2018-09-07 ENCOUNTER — Ambulatory Visit (INDEPENDENT_AMBULATORY_CARE_PROVIDER_SITE_OTHER): Payer: 59 | Admitting: Internal Medicine

## 2018-09-07 VITALS — BP 104/80 | HR 62 | Temp 98.6°F | Ht 64.0 in | Wt 237.4 lb

## 2018-09-07 DIAGNOSIS — E1165 Type 2 diabetes mellitus with hyperglycemia: Secondary | ICD-10-CM | POA: Diagnosis not present

## 2018-09-07 MED ORDER — METFORMIN HCL 1000 MG PO TABS: 1000.0000 mg | ORAL_TABLET | Freq: Two times a day (BID) | ORAL | 3 refills | Status: DC

## 2018-09-07 NOTE — Patient Instructions (Signed)
Start Metformin half tablet daily with Breakfast for 1 week, then increase to half tablet with Breakfast and half a  tablet with Supper for 1 week, then increase to 1 tablet with Breakfast  and continue half a  tablet with Supper for another 1 week , then finally 1 Tablet with Breakfast and 1 tablet with Supper.    - Decrease Lantus to 15 units daily   -Choose healthy, lower carb lower calorie snacks: toss salad, cooked vegetables, cottage cheese, peanut butter, low fat cheese / string cheese, lower sodium deli meat, tuna salad or chicken salad     HOW TO TREAT LOW BLOOD SUGARS (Blood sugar LESS THAN 70 MG/DL)  Please follow the RULE OF 15 for the treatment of hypoglycemia treatment (when your (blood sugars are less than 70 mg/dL)    STEP 1: Take 15 grams of carbohydrates when your blood sugar is low, which includes:   3-4 GLUCOSE TABS  OR  3-4 OZ OF JUICE OR REGULAR SODA OR  ONE TUBE OF GLUCOSE GEL     STEP 2: RECHECK blood sugar in 15 MINUTES STEP 3: If your blood sugar is still low at the 15 minute recheck --> then, go back to STEP 1 and treat AGAIN with another 15 grams of carbohydrates.

## 2018-09-07 NOTE — Progress Notes (Signed)
Name: Nina Phillips  MRN/ DOB: [id number], 1990/12/27   Age/ Sex: 28 y.o., female    PCP: Bing NeighborsHarris, Kimberly S, FNP   Reason for Endocrinology Evaluation: Type 2 Diabetes Mellitus     Date of Initial Endocrinology Visit: 09/08/2018     PATIENT IDENTIFIER: Nina Phillips is a 28 y.o. female with a past medical history T2DM. The patient presented for initial endocrinology clinic visit on 09/08/2018 for consultative assistance with her diabetes management.    HPI: Nina Phillips was    Diagnosed with T2DM 06/2018 Prior Medications tried/Intolerance: N/A Currently checking blood sugars 2 x / day,  before breakfast and bedtime   Hypoglycemia episodes : no         Hemoglobin A1c was 10.6 %  Patient required assistance for hypoglycemia:  Patient has required hospitalization within the last 1 year from hyper or hypoglycemia:   In terms of diet, the patient eats 2 meals a day , snacks for lunch. The pt does not drink any sugar sweetened beverages.   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   Diabetes mellitus without complication (HCC)    Type 2   Gout    Ovarian cyst    Past Surgical History:  Past Surgical History:  Procedure Laterality Date   DILATION AND CURETTAGE OF UTERUS     WISDOM TOOTH EXTRACTION        Social History:  reports that she has quit smoking. She has  never used smokeless tobacco. She reports that she does not drink alcohol or use drugs. Family History:  Family History  Problem Relation Age of Onset   Cancer Other    Hypertension Other    Diabetes Other    Hypertension Mother    Gout Mother    Diabetes Father    Hypertension Father    Stroke Father    Hypertension Maternal Grandfather    Heart attack Maternal Grandfather    Stroke Paternal Grandmother    Hypertension Maternal Aunt    Cancer Maternal Grandmother        breast   Diabetes Paternal Grandfather      HOME MEDICATIONS: Allergies as of 09/07/2018      Reactions   Apple Fruit Extract Hives   Banana Hives   Other Hives   Walnuts   Shellfish Allergy Hives   Tomato Hives   Watermelon [citrullus Vulgaris] Hives   Bactrim [sulfamethoxazole-trimethoprim] Hives, Rash   Sulfa Antibiotics Hives, Rash      Medication List       Accurate as of September 07, 2018 11:59 PM. If you have any questions, ask your nurse or doctor.        STOP taking these medications   amoxicillin 875 MG tablet Commonly known as: AMOXIL Stopped by: Scarlette ShortsIbtehal J Anela Bensman, MD   HYDROcodone-acetaminophen 5-325 MG  tablet Commonly known as: NORCO/VICODIN Stopped by: Dorita Sciara, MD   ibuprofen 600 MG tablet Commonly known as: ADVIL Stopped by: Dorita Sciara, MD   misoprostol 200 MCG tablet Commonly known as: CYTOTEC Stopped by: Dorita Sciara, MD   promethazine 25 MG tablet Commonly known as: PHENERGAN Stopped by: Dorita Sciara, MD     TAKE these medications   Accu-Chek FastClix Lancets Misc USE TO CHECK BLOOD SUGAR FOUR TIMES DAILY AND AS NEEDED   Accu-Chek Guide test strip Generic drug: glucose blood USE 1 STRIP 4 TIMES DAILY AND AS NEEDED   acetaminophen 500 MG tablet Commonly known as: TYLENOL Take 500-1,000 mg by mouth every 6 (six) hours as needed for mild pain, moderate pain or headache.   BD Pen Needle Nano U/F 32G X 4 MM  Misc Generic drug: Insulin Pen Needle USE ONE NEEDLE DAILY WITH INSULIN INJECTION   diphenhydrAMINE 25 mg capsule Commonly known as: BENADRYL Take 25 mg by mouth every 6 (six) hours as needed for allergies (and allergic reactions).   ferrous sulfate 325 (65 FE) MG tablet Take 1 tablet (325 mg total) by mouth daily.   Lantus SoloStar 100 UNIT/ML Solostar Pen Generic drug: Insulin Glargine Inject 15 Units into the skin daily.   metFORMIN 1000 MG tablet Commonly known as: GLUCOPHAGE Take 1 tablet (1,000 mg total) by mouth 2 (two) times daily with a meal. Started by: Dorita Sciara, MD        ALLERGIES: Allergies  Allergen Reactions   Apple Fruit Extract Hives   Banana Hives   Other Hives    Walnuts    Shellfish Allergy Hives   Tomato Hives   Watermelon [Citrullus Vulgaris] Hives   Bactrim [Sulfamethoxazole-Trimethoprim] Hives and Rash   Sulfa Antibiotics Hives and Rash     REVIEW OF SYSTEMS: A comprehensive ROS was conducted with the patient and is negative except as per HPI and below:  Review of Systems  Constitutional: Positive for weight loss. Negative for malaise/fatigue.  HENT: Negative for congestion and sore throat.   Eyes: Negative for blurred vision and pain.  Respiratory: Negative for cough and shortness of breath.   Cardiovascular: Negative for chest pain and palpitations.  Gastrointestinal: Negative for diarrhea and nausea.  Genitourinary: Negative for frequency.  Skin: Negative.   Neurological: Negative for tingling and tremors.  Endo/Heme/Allergies: Negative for polydipsia.  Psychiatric/Behavioral: Positive for depression. The patient is not nervous/anxious.       OBJECTIVE:   VITAL SIGNS: BP 104/80 (BP Location: Left Arm, Patient Position: Sitting, Cuff Size: Large)    Pulse 62    Temp 98.6 F (37 C)    Ht 5\' 4"  (1.626 m)    Wt 237 lb 6.4 oz (107.7 kg)    LMP 02/11/2018    SpO2 99%    BMI 40.75 kg/m    PHYSICAL EXAM:  General:  Pt appears well and is in NAD  Hydration: Well-hydrated with moist mucous membranes and good skin turgor  HEENT: Head: Unremarkable with good dentition. Oropharynx clear without exudate.  Eyes: External eye exam normal without stare, lid lag or exophthalmos.  EOM intact.   Neck: General: Supple without adenopathy or carotid bruits. Thyroid: Thyroid size normal.  No goiter or nodules appreciated. No thyroid bruit.  Lungs: Clear with good BS bilat with no rales, rhonchi, or wheezes  Heart: RRR with normal S1 and S2 and no gallops; no murmurs; no rub  Abdomen: Normoactive bowel sounds, soft, nontender, without  masses or organomegaly palpable  Extremities:  Lower extremities - No pretibial edema. No lesions.  Skin: Normal texture and temperature to palpation. No rash noted. No Acanthosis nigricans/skin tags. No lipohypertrophy.  Neuro: MS is good with appropriate affect, pt is alert and Ox3    DM foot exam: 09/07/2018   The skin of the feet is intact without sores or ulcerations. The pedal pulses are 2+ on right and 2+ on left. The sensation is intact to a screening 5.07, 10 gram monofilament bilaterally   DATA REVIEWED:  Lab Results  Component Value Date   HGBA1C 10.3 (H) 08/09/2018   HGBA1C 10.6 (H) 07/17/2018   Lab Results  Component Value Date   CREATININE 0.93 07/17/2018     ASSESSMENT / PLAN / RECOMMENDATIONS:   1) Newly Diagnosed Type 2 Diabetes Mellitus, Without complications - Most recent A1c of 10.3 %. Goal A1c < 7.0 %.    Plan: GENERAL: I have discussed with the patient the pathophysiology of diabetes. We went over the natural progression of the disease. We talked about both insulin resistance and insulin deficiency. We stressed the importance of lifestyle changes including diet and exercise. I explained the complications associated with diabetes including retinopathy, nephropathy, neuropathy as well as increased risk of cardiovascular disease. We went over the benefit  seen with glycemic control.    I explained to the patient that diabetic patients are at higher than normal risk for amputations.  Pt is motivated, I have advised her to hold off on pregnancy until her A1c is 7.0% or less.We discussed pregnancy expectation for diabetes control.   We discussed starting metformin, discussed GI side effects and how gradually titrate up the dose. Discussed future add-on options with GLP-1 agonists and SGLT-2 inhibitors.    MEDICATIONS:  Metformin 1000 mg BID   Decrease Lantus to 15 units daily   EDUCATION / INSTRUCTIONS:  BG monitoring instructions: Patient is instructed to check her blood sugars 2 times a day, fasting and bedtime.  Call [facility] if: BG persistently < 70 or > 300.  I reviewed the Rule of 15 for the treatment of hypoglycemia in detail with the patient. Literature supplied.   2) Diabetic complications:   Eye: Does not have known diabetic retinopathy.   Neuro/ Feet: Does not have known diabetic peripheral neuropathy.  Renal: Patient does not have known baseline CKD.   3) Lipids: Patient is  on a statin. This will not be indicated until age 28 , unless she develops severe hyperlipidemia, then it can be started sooner.    F/U in 2 months       Signed electronically by: Lyndle HerrlichAbby Jaralla Jaymie Misch, MD  [facility]  [facility] [address] [place], [place] [address] Phone: [phone] FAX: [phone]   CC: Bing NeighborsHarris, Kimberly S, FNP [address] [place] [address] Phone: [phone]  Fax: [phone]    Return to Endocrinology clinic as below: No future appointments.

## 2018-11-21 ENCOUNTER — Encounter: Payer: Self-pay | Attending: Internal Medicine | Admitting: Dietician

## 2018-11-21 ENCOUNTER — Encounter: Payer: Self-pay | Admitting: Dietician

## 2018-11-21 ENCOUNTER — Other Ambulatory Visit: Payer: Self-pay

## 2018-11-21 DIAGNOSIS — E119 Type 2 diabetes mellitus without complications: Secondary | ICD-10-CM | POA: Insufficient documentation

## 2018-11-21 NOTE — Patient Instructions (Signed)
Plan:  Aim for 3-4 Carb Choices per meal (45-60 grams) +/- 1 either way  Aim for 0 Carbs per snack if hungry (Choose celery with peanut butter or string cheese or a small handful of nuts.  If you are pregnant, choose 15 grams of carbohydrate and a protein choice for a snack. Include protein in moderation with your meals and snacks Consider reading food labels for Total Carbohydrate of foods Consider  increasing your activity level by walking for 30 minutes daily as tolerated Continue checking BG at alternate times per day  Continue taking medication as directed by MD

## 2018-11-21 NOTE — Progress Notes (Signed)
Diabetes Self-Management Education  Visit Type: First/Initial  Appt. Start Time: 1550 Appt. End Time: 1700  11/24/2018  [name], identified by name and date of birth, is a 28 y.o. female with a diagnosis of Diabetes: Type 2.   ASSESSMENT Patient is here today alone. History includes newly diagnosed diabetes in July 2020.  She lost a twin pregnancy at that time.  She also has anemia, anxiety, and depression. Labs noted:  A1C 10.6% 6/22 and 10.3% 7/15.   Medications include Lantus 15 units at 2-3 in the afternoon, Metformin  Weight hx: 234 lbs today 268 lbs 4 months ago 220 lbs lowest adult weight about 1 year ago  Patient is allergic to apples, bananas, watermelon, and sometimes other fruit unless they are organic.  She is also allergic to tomatoes, walnuts and shellfish.  She does not eat pork except for bacon.  She is lactose intolerant.  Patient lives with her boyfriend and his grandmother.  They share shopping and cooking.  Her boyfriend is a Biomedical scientist and is mindfull of her needs. Her father has type 2 diabetes and is very supportive. 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  Height 5\' 3"  (1.6 m), weight 234 lb (106.1 kg), last menstrual period 02/11/2018, unknown if currently breastfeeding. Body mass index is 41.45 kg/m.  Diabetes Self-Management Education - 11/21/18 1559      Visit Information   Visit Type  First/Initial      Initial Visit   Diabetes Type  Type 2    Are you currently following a meal plan?  No    Are you taking your medications as prescribed?  Yes    Date Diagnosed  07/2018      Health Coping   How would you rate your overall health?  Fair      Psychosocial Assessment   Patient Belief/Attitude about Diabetes  Motivated to manage diabetes    Self-care barriers  None    Self-management support  Doctor's office;Family;Friends    Other persons present  Patient    Patient Concerns  Nutrition/Meal  planning;Glycemic Control;Weight Control    Special Needs  None    Preferred Learning Style  No preference indicated    Learning Readiness  Ready    How often do you need to have someone help you when you read instructions, pamphlets, or other written materials from your doctor or pharmacy?  1 - Never    What is the last grade level you completed in school?  12th grade      Pre-Education Assessment   Patient understands the diabetes disease and treatment process.  Needs Instruction    Patient understands incorporating nutritional management into lifestyle.  Needs Instruction    Patient undertands incorporating physical activity into lifestyle.  Needs Instruction    Patient understands using medications safely.  Needs Instruction    Patient understands monitoring blood glucose, interpreting and using results  Needs Instruction    Patient understands prevention, detection, and treatment of acute complications.  Needs Instruction    Patient understands prevention, detection, and treatment of chronic complications.  Needs Instruction    Patient understands how to develop strategies to address psychosocial issues.  Needs Instruction    Patient understands how to develop strategies to promote health/change behavior.  Needs Instruction      Complications   Last HgB A1C per patient/outside source  10.3 %   08/09/2018   How often do you  check your blood sugar?  1-2 times/day    Fasting Blood glucose range (mg/dL)  43-329    Postprandial Blood glucose range (mg/dL)  51-884    Number of hypoglycemic episodes per month  6    Can you tell when your blood sugar is low?  No    Number of hyperglycemic episodes per week  0    Have you had a dilated eye exam in the past 12 months?  No    Have you had a dental exam in the past 12 months?  No    Are you checking your feet?  No      Dietary Intake   Breakfast  instant brown sugar cinnamon oatmeal OR eggs, bacon and occasion fruit    Snack (morning)  none     Lunch  sometimes large and sometimes small- Often leftovers OR skips    Snack (afternoon)  occasional nuts or PB with celery or string cheese    Dinner  Salmon (baked or sauteed in olive oil), spaghetti, vegetables OR chicken (baked and rarely fried), mashed potatoes, vegetables, sometimes with salad    Beverage(s)  water, occasional juice (fruit and vege V-8), rare regular gingerale      Exercise   Exercise Type  Light (walking / raking leaves)   walks   How many days per week to you exercise?  3    How many minutes per day do you exercise?  20    Total minutes per week of exercise  60      Patient Education   Previous Diabetes Education  No    Disease state   Definition of diabetes, type 1 and 2, and the diagnosis of diabetes    Nutrition management   Role of diet in the treatment of diabetes and the relationship between the three main macronutrients and blood glucose level;Food label reading, portion sizes and measuring food.;Carbohydrate counting;Meal timing in regards to the patients' current diabetes medication.;Meal options for control of blood glucose level and chronic complications.;Information on hints to eating out and maintain blood glucose control.    Physical activity and exercise   Role of exercise on diabetes management, blood pressure control and cardiac health.    Medications  Reviewed patients medication for diabetes, action, purpose, timing of dose and side effects.;Taught/reviewed insulin injection, site rotation, insulin storage and needle disposal.    Monitoring  Purpose and frequency of SMBG.;Identified appropriate SMBG and/or A1C goals.;Taught/discussed recording of test results and interpretation of SMBG.;Daily foot exams;Yearly dilated eye exam    Acute complications  Taught treatment of hypoglycemia - the 15 rule.    Chronic complications  Relationship between chronic complications and blood glucose control;Retinopathy and reason for yearly dilated eye exams;Dental  care    Psychosocial adjustment  Worked with patient to identify barriers to care and solutions;Identified and addressed patients feelings and concerns about diabetes    Preconception care  Role of family planning for patients with diabetes      Individualized Goals (developed by patient)   Nutrition  General guidelines for healthy choices and portions discussed    Physical Activity  Exercise 5-7 days per week;30 minutes per day    Medications  take my medication as prescribed    Monitoring   test my blood glucose as discussed    Health Coping  discuss diabetes with (comment)   MD, RD, CDE     Post-Education Assessment   Patient understands the diabetes disease and treatment process.  Demonstrates  understanding / competency    Patient understands incorporating nutritional management into lifestyle.  Demonstrates understanding / competency    Patient undertands incorporating physical activity into lifestyle.  Demonstrates understanding / competency    Patient understands using medications safely.  Demonstrates understanding / competency    Patient understands monitoring blood glucose, interpreting and using results  Demonstrates understanding / competency    Patient understands prevention, detection, and treatment of acute complications.  Demonstrates understanding / competency    Patient understands prevention, detection, and treatment of chronic complications.  Demonstrates understanding / competency    Patient understands how to develop strategies to address psychosocial issues.  Demonstrates understanding / competency    Patient understands how to develop strategies to promote health/change behavior.  Demonstrates understanding / competency      Outcomes   Expected Outcomes  Demonstrated interest in learning. Expect positive outcomes    Future DMSE  PRN    Program Status  Completed       Individualized Plan for Diabetes Self-Management Training:   Learning Objective:  Patient will  have a greater understanding of diabetes self-management. Patient education plan is to attend individual and/or group sessions per assessed needs and concerns.   Plan:   Patient Instructions  Plan:  Aim for 3-4 Carb Choices per meal (45-60 grams) +/- 1 either way  Aim for 0 Carbs per snack if hungry (Choose celery with peanut butter or string cheese or a small handful of nuts.  If you are pregnant, choose 15 grams of carbohydrate and a protein choice for a snack. Include protein in moderation with your meals and snacks Consider reading food labels for Total Carbohydrate of foods Consider  increasing your activity level by walking for 30 minutes daily as tolerated Continue checking BG at alternate times per day  Continue taking medication as directed by MD      Expected Outcomes:  Demonstrated interest in learning. Expect positive outcomes  Education material provided: ADA - How to Thrive: A Guide for Your Journey with Diabetes, Meal plan card and Snack sheet  If problems or questions, patient to contact team via:  Phone and Email  Future DSME appointment: PRN

## 2018-12-09 ENCOUNTER — Encounter: Payer: Self-pay | Admitting: Emergency Medicine

## 2018-12-09 ENCOUNTER — Other Ambulatory Visit: Payer: Self-pay

## 2018-12-09 ENCOUNTER — Ambulatory Visit
Admission: EM | Admit: 2018-12-09 | Discharge: 2018-12-09 | Disposition: A | Discharge status: Home / Self Care | Payer: Self-pay | Attending: Physician Assistant | Admitting: Physician Assistant

## 2018-12-09 DIAGNOSIS — H0102A Squamous blepharitis right eye, upper and lower eyelids: Secondary | ICD-10-CM

## 2018-12-09 DIAGNOSIS — H0102B Squamous blepharitis left eye, upper and lower eyelids: Secondary | ICD-10-CM

## 2018-12-09 MED ORDER — ERYTHROMYCIN 5 MG/GM OP OINT: TOPICAL_OINTMENT | OPHTHALMIC | 0 refills | Status: DC

## 2018-12-09 NOTE — ED Triage Notes (Signed)
Pt presents to [facility] for assessment 1 day of bilateral eye irritation, watering, itchy, soreness to touch.  Patient states she has been "doing my lashes all week" and may have irritated it.  No redness noted by this RN.  Patient states eyes re sensitivity to light.

## 2018-12-09 NOTE — Discharge Instructions (Signed)
Erythromycin ointment as directed. Start systane or genteal artificial tear gel/drop/ointment. Lid scrubs and warm compresses as directed. Discard current pair of contacts and avoid contact lens use until symptoms completely resolve. Monitor for any worsening of symptoms, changes in vision, sensitivity to light, eye swelling, painful eye movement, follow up with ophthalmology for further evaluation.

## 2018-12-09 NOTE — ED Provider Notes (Signed)
[name]    CSN: [id number] Arrival date & time: 12/09/18  1547      History   Chief Complaint Chief Complaint  Patient presents with  . eye irritation    HPI Nina Phillips is a 28 y.o. female.   28 year old female comes in for 1 day history of bilateral eye irritation, watering, itching, pain and photophobia.  Patient states she has been doing fake lashes for the past week, and may have irritated the eyes.  She has since removed eyelashes, but without relief of symptoms.  She has not noticed any vision changes.  States apart from watering, no drainage to the eyes.  Denies any conjunctivitis.  Denies eyelid swelling.  Denies URI symptoms such as cough, congestion, sore throat.  Denies fever, chills, body aches.  Patient wears monthly contacts.  Current pair 6 weeks old.  She denies wearing contacts to sleep.  Since symptoms started, she has removed contacts.  States she had leftover prednisone eyedrops, and tried it without relief of symptoms.     Past Medical History:  Diagnosis Date  . Anemia   . Anxiety   . Bicornate uterus   . Depression    seeing a therapist since 3rd miscarriage, helping, doing good.  . Diabetes mellitus without complication (HCC)    Type 2  . Gout   . Ovarian cyst     Patient Active Problem List   Diagnosis Date Noted  . Trichomonas infection 08/24/2018  . GBS bacteriuria 08/20/2018  . 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 New onset type 2 diabetes mellitus ([place]) 07/17/2018  . Hyperglycemia 07/17/2018  . 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 DILATION AND CURETTAGE OF UTERUS    . WISDOM TOOTH EXTRACTION      OB History    Gravida  4   Para      Term      Preterm      AB  3   Living  0     SAB  3   TAB      Ectopic      Multiple  0   Live Births               Home Medications    Prior to Admission medications   Medication Sig Start Date End Date Taking? Authorizing Provider  Accu-Chek FastClix Lancets MISC USE TO CHECK BLOOD SUGAR FOUR TIMES DAILY AND AS NEEDED 07/17/18   [provider]  ACCU-CHEK GUIDE test strip USE 1 STRIP 4 TIMES DAILY AND AS NEEDED 07/17/18   [provider]  acetaminophen (TYLENOL) 500 MG tablet Take 500-1,000 mg by mouth every 6 (six) hours as needed for mild pain, moderate pain or headache.     [provider]  BD PEN NEEDLE NANO U/F 32G X 4 MM MISC USE ONE NEEDLE DAILY WITH INSULIN INJECTION 07/17/18   [provider]  diphenhydrAMINE (BENADRYL) 25 mg capsule Take 25 mg by mouth every 6 (six) hours as needed for allergies (and allergic reactions).     [provider]  erythromycin ophthalmic ointment Place a 1/2 inch ribbon of ointment into both eyes 4 times a day for 7 days 12/09/18   Tasia Catchings, Amy V, PA-C  ferrous sulfate 325 (65 FE) MG tablet Take 1 tablet (325 mg total) by mouth daily. Patient not taking: Reported on 11/21/2018  08/16/18 08/16/19  Nugent, Odie SeraNicole E, NP  Insulin Glargine (LANTUS SOLOSTAR) 100 UNIT/ML Solostar Pen Inject 15 Units into the skin daily. 08/14/18   Bing NeighborsHarris, Kimberly S, FNP  metFORMIN (GLUCOPHAGE) 1000 MG tablet Take 1 tablet (1,000 mg total) by mouth 2 (two) times daily with a meal. 09/07/18   Shamleffer, Konrad DoloresIbtehal Jaralla, MD    Family History Family History  Problem Relation Age of Onset  . Cancer Other   . Hypertension Other   . Diabetes Other   . Hypertension Mother   . Gout Mother   . Diabetes Father   . Hypertension Father   . Stroke Father   . Hypertension Maternal Grandfather   . Heart attack Maternal Grandfather   . Stroke Paternal Grandmother   . Hypertension Maternal Aunt   . Cancer Maternal Grandmother        breast  . Diabetes Paternal Grandfather     Social History Social History   Tobacco Use  . Smoking status: Former Games developermoker  .  Smokeless tobacco: Never Used  . Tobacco comment: quit 2014  Substance Use Topics  . Alcohol use: No    Frequency: Never    Comment: occas  . Drug use: No     Allergies   Apple fruit extract, Banana, Other, Shellfish allergy, Tomato, Watermelon [citrullus vulgaris], Bactrim [sulfamethoxazole-trimethoprim], and Sulfa antibiotics   Review of Systems Review of Systems  Reason unable to perform ROS: See HPI as above.     Physical Exam Triage Vital Signs ED Triage Vitals [12/09/18 1600]  Enc Vitals Group     BP (!) 151/97     Pulse Rate 88     Resp 18     Temp 97.9 F (36.6 C)     Temp Source Temporal     SpO2 100 %     Weight      Height      Head Circumference      Peak Flow      Pain Score 7     Pain Loc      Pain Edu?      Excl. in GC?    No data found.  Updated Vital Signs BP (!) 151/97 (BP Location: Left Arm)   Pulse 88   Temp 97.9 F (36.6 C) (Temporal)   Resp 18   LMP 11/28/2018   SpO2 100%   Breastfeeding Unknown   Visual Acuity Right Eye Distance:   Left Eye Distance:   Bilateral Distance:    Right Eye Near: R Near: 20/25 Left Eye Near:  L Near: 20/25 Bilateral Near:  20/25  Physical Exam Constitutional:      General: She is not in acute distress.    Appearance: She is well-developed. She is not ill-appearing, toxic-appearing or diaphoretic.  HENT:     Head: Normocephalic and atraumatic.  Eyes:     General: Lids are normal. Lids are everted, no foreign bodies appreciated.     Extraocular Movements: Extraocular movements intact.     Conjunctiva/sclera: Conjunctivae normal.     Pupils: Pupils are equal, round, and reactive to light.     Comments: Photophobia on exam.  No consensual photophobia.  No chemosis, conjunctival injection.  No ulcers seen.  Fluorescein stain without uptake.  Neurological:     Mental Status: She is alert and oriented to person, place, and time.      UC Treatments / Results  Labs (all labs ordered are listed,  but only abnormal results are displayed) Labs  Reviewed - No data to display  EKG   Radiology No results found.  Procedures Procedures (including critical care time)  Medications Ordered in UC Medications - No data to display  Initial Impression / Assessment and Plan / UC Course  I have reviewed the triage vital signs and the nursing notes.  Pertinent labs & imaging results that were available during my care of the patient were reviewed by me and considered in my medical decision making (see chart for details).    No alarming signs on exam.  No significant changes in vision.  No signs of corneal abrasion, ulceration on exam.  Patient without conjunctival injection, lower suspicion for iritis, uveitis.  Symptoms improved with saline drops.  Discussed blepharitis causing dry eyes, which in turn could be causing current symptoms.  Will have patient use erythromycin ointment to prevent infection, as well as moisturize eyes.  Start artificial tears as directed.  Lid scrubs and warm compress.  Patient to avoid contact lens use until symptoms resolve.  Return precautions given.  Patient expresses understanding and agrees to plan.  Final Clinical Impressions(s) / UC Diagnoses   Final diagnoses:  Squamous blepharitis of upper and lower eyelids of both eyes    ED Prescriptions    Medication Sig Dispense Auth. Provider   erythromycin ophthalmic ointment Place a 1/2 inch ribbon of ointment into both eyes 4 times a day for 7 days 1 g Belinda Fisher, PA-C     PDMP not reviewed this encounter.   Belinda Fisher, PA-C 12/09/18 [phone]

## 2018-12-09 NOTE — ED Notes (Signed)
Patient able to ambulate independently  

## 2018-12-11 ENCOUNTER — Other Ambulatory Visit: Payer: Self-pay

## 2018-12-11 ENCOUNTER — Encounter (HOSPITAL_COMMUNITY): Payer: Self-pay

## 2018-12-11 ENCOUNTER — Ambulatory Visit (HOSPITAL_COMMUNITY)
Admission: EM | Admit: 2018-12-11 | Discharge: 2018-12-11 | Disposition: A | Discharge status: Home / Self Care | Payer: Self-pay | Attending: Internal Medicine | Admitting: Internal Medicine

## 2018-12-11 DIAGNOSIS — M109 Gout, unspecified: Secondary | ICD-10-CM

## 2018-12-11 MED ORDER — METHYLPREDNISOLONE ACETATE 80 MG/ML IJ SUSP
80.0000 mg | Freq: Once | INTRAMUSCULAR | Status: AC
  Administered 2018-12-11: 13:00:00 80 mg via INTRAMUSCULAR

## 2018-12-11 MED ORDER — METHYLPREDNISOLONE ACETATE 80 MG/ML IJ SUSP
INTRAMUSCULAR | Status: AC
  Filled 2018-12-11: qty 1

## 2018-12-11 MED ORDER — INDOMETHACIN 50 MG PO CAPS: 50.0000 mg | ORAL_CAPSULE | Freq: Three times a day (TID) | ORAL | 0 refills | Status: DC | PRN

## 2018-12-11 NOTE — ED Triage Notes (Signed)
Pt present to the UC with right ankle swelling and right foot swelling x 1 day. Pt states the swelling is related to gout.

## 2018-12-11 NOTE — ED Provider Notes (Signed)
[facility]    CSN: [id number] Arrival date & time: 12/11/18  1114      History   Chief Complaint Chief Complaint  Patient presents with  . Gout    HPI Nina Phillips is a 28 y.o. female.   Nina Phillips presents with complaints of right ankle and foot pain which started yesterday. No fevers. No injury. Pain with weight bearing. Feels similar to gout which she has had in the past to the same foot. States she was diagnosed when she was 18, had elevated uric acid levels. Has had intermittent flares ever since. Hasn't taken any medications for pain. Has been seen in UC in the past for the same. Always same foot/ankle which is involved.    ROS per HPI, negative if not otherwise mentioned.      Past Medical History:  Diagnosis Date  . Anemia   . Anxiety   . Bicornate uterus   . Depression    seeing a therapist since 3rd miscarriage, helping, doing good.  . Diabetes mellitus without complication (HCC)    Type 2  . Gout   . Ovarian cyst     Patient Active Problem List   Diagnosis Date Noted  . Trichomonas infection 08/24/2018  . GBS bacteriuria 08/20/2018  . 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 New onset type 2 diabetes mellitus (HCC) 07/17/2018  . Hyperglycemia 07/17/2018  . 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 DILATION AND CURETTAGE OF UTERUS    . WISDOM TOOTH EXTRACTION      OB History    Gravida  4   Para      Term      Preterm      AB  3   Living  0     SAB  3   TAB      Ectopic      Multiple  0   Live Births               Home Medications    Prior to Admission medications   Medication Sig Start Date End Date Taking? Authorizing Provider  Accu-Chek FastClix Lancets MISC USE TO CHECK BLOOD SUGAR FOUR TIMES DAILY AND AS NEEDED 07/17/18   [provider]  ACCU-CHEK  GUIDE test strip USE 1 STRIP 4 TIMES DAILY AND AS NEEDED 07/17/18   [provider]  acetaminophen (TYLENOL) 500 MG tablet Take 500-1,000 mg by mouth every 6 (six) hours as needed for mild pain, moderate pain or headache.     [provider]  BD PEN NEEDLE NANO U/F 32G X 4 MM MISC USE ONE NEEDLE DAILY WITH INSULIN INJECTION 07/17/18   [provider]  diphenhydrAMINE (BENADRYL) 25 mg capsule Take 25 mg by mouth every 6 (six) hours as needed for allergies (and allergic reactions).     [provider]  erythromycin ophthalmic ointment Place a 1/2 inch ribbon of ointment into both eyes 4 times a day for 7 days 12/09/18   Cathie Hoops, Amy V, PA-C  ferrous sulfate 325 (65 FE) MG tablet Take 1 tablet (325 mg total) by mouth daily. Patient not taking: Reported on 11/21/2018 08/16/18 08/16/19  Nugent, Odie Sera, NP  indomethacin (INDOCIN) 50 MG capsule Take 1 capsule (50 mg total) by mouth 3 (three) times daily as needed for moderate pain. Decrease frequency to stop as pain resolves 12/11/18  Georgetta HaberBurky, Ettie Krontz B, NP  Insulin Glargine (LANTUS SOLOSTAR) 100 UNIT/ML Solostar Pen Inject 15 Units into the skin daily. 08/14/18   Bing NeighborsHarris, Kimberly S, FNP  metFORMIN (GLUCOPHAGE) 1000 MG tablet Take 1 tablet (1,000 mg total) by mouth 2 (two) times daily with a meal. 09/07/18   Shamleffer, Konrad DoloresIbtehal Jaralla, MD    Family History Family History  Problem Relation Age of Onset  . Cancer Other   . Hypertension Other   . Diabetes Other   . Hypertension Mother   . Gout Mother   . Diabetes Father   . Hypertension Father   . Stroke Father   . Hypertension Maternal Grandfather   . Heart attack Maternal Grandfather   . Stroke Paternal Grandmother   . Hypertension Maternal Aunt   . Cancer Maternal Grandmother        breast  . Diabetes Paternal Grandfather     Social History Social History   Tobacco Use  . Smoking status: Former Games developermoker  . Smokeless tobacco: Never Used  . Tobacco comment: quit  2014  Substance Use Topics  . Alcohol use: No    Frequency: Never    Comment: occas  . Drug use: No     Allergies   Apple fruit extract, Banana, Other, Shellfish allergy, Tomato, Watermelon [citrullus vulgaris], Bactrim [sulfamethoxazole-trimethoprim], and Sulfa antibiotics   Review of Systems Review of Systems   Physical Exam Triage Vital Signs ED Triage Vitals  Enc Vitals Group     BP 12/11/18 1202 138/89     Pulse Rate 12/11/18 1202 79     Resp 12/11/18 1202 16     Temp 12/11/18 1202 98.7 F (37.1 C)     Temp Source 12/11/18 1202 Oral     SpO2 12/11/18 1202 97 %     Weight --      Height --      Head Circumference --      Peak Flow --      Pain Score 12/11/18 1200 9     Pain Loc --      Pain Edu? --      Excl. in GC? --    No data found.  Updated Vital Signs BP 138/89 (BP Location: Right Arm)   Pulse 79   Temp 98.7 F (37.1 C) (Oral)   Resp 16   LMP 11/28/2018   SpO2 97%   Visual Acuity Right Eye Distance:   Left Eye Distance:   Bilateral Distance:    Right Eye Near:   Left Eye Near:    Bilateral Near:     Physical Exam Constitutional:      General: She is not in acute distress.    Appearance: She is well-developed.  Cardiovascular:     Rate and Rhythm: Normal rate.  Pulmonary:     Effort: Pulmonary effort is normal.  Musculoskeletal:     Right ankle: She exhibits swelling. She exhibits normal range of motion, no ecchymosis, no deformity, no laceration and normal pulse. Tenderness.     Right foot: Decreased range of motion. Normal capillary refill. Tenderness, bony tenderness and swelling present. No crepitus, deformity or laceration.     Comments: Generalized swelling and tenderness to right foot and ankle; no redness, minimal warmth; pain with ROM; strong pulses; cap refill < 2 seconds    Skin:    General: Skin is warm and dry.  Neurological:     Mental Status: She is alert and oriented to person, place, and time.  UC Treatments /  Results  Labs (all labs ordered are listed, but only abnormal results are displayed) Labs Reviewed - No data to display  EKG   Radiology No results found.  Procedures Procedures (including critical care time)  Medications Ordered in UC Medications  methylPREDNISolone acetate (DEPO-MEDROL) injection 80 mg (80 mg Intramuscular Given 12/11/18 1237)  methylPREDNISolone acetate (DEPO-MEDROL) 80 MG/ML injection (has no administration in time range)    Initial Impression / Assessment and Plan / UC Course  I have reviewed the triage vital signs and the nursing notes.  Pertinent labs & imaging results that were available during my care of the patient were reviewed by me and considered in my medical decision making (see chart for details).     Personal and family history of gout per patient, has been seen here for similar in the past. Right foot and ankle pain despite no injury. Depo medrol IM provided per patient request, use of indomethacin PRN for pain. Encouraged follow up with PCP for long term management. Low purine diet instructions provided. Patient verbalized understanding and agreeable to plan.   Final Clinical Impressions(s) / UC Diagnoses   Final diagnoses:  Acute gout of right ankle, unspecified cause     Discharge Instructions     Start indomethacin as needed three times a day. Taper this down to twice a day and then quit as able as pain improves. Drink plenty of water.  Ice, elevation to help with pain.  Monitor your blood sugar as the injection we gave you today can increase your blood sugar.  Please follow up with primary care and/or sports medicine for long term management .    ED Prescriptions    Medication Sig Dispense Auth. Provider   indomethacin (INDOCIN) 50 MG capsule Take 1 capsule (50 mg total) by mouth 3 (three) times daily as needed for moderate pain. Decrease frequency to stop as pain resolves 20 capsule Augusto Gamble B, NP     PDMP not reviewed  this encounter.   Zigmund Gottron, NP 12/11/18 1321

## 2018-12-11 NOTE — Discharge Instructions (Signed)
Start indomethacin as needed three times a day. Taper this down to twice a day and then quit as able as pain improves. Drink plenty of water.  Ice, elevation to help with pain.  Monitor your blood sugar as the injection we gave you today can increase your blood sugar.  Please follow up with primary care and/or sports medicine for long term management .

## 2018-12-20 ENCOUNTER — Ambulatory Visit (HOSPITAL_COMMUNITY)
Admission: EM | Admit: 2018-12-20 | Discharge: 2018-12-20 | Disposition: A | Discharge status: Home / Self Care | Payer: Self-pay | Attending: Family Medicine | Admitting: Family Medicine

## 2018-12-20 ENCOUNTER — Other Ambulatory Visit: Payer: Self-pay

## 2018-12-20 ENCOUNTER — Encounter (HOSPITAL_COMMUNITY): Payer: Self-pay | Admitting: Emergency Medicine

## 2018-12-20 DIAGNOSIS — R079 Chest pain, unspecified: Secondary | ICD-10-CM

## 2018-12-20 DIAGNOSIS — R0789 Other chest pain: Secondary | ICD-10-CM

## 2018-12-20 MED ORDER — DICLOFENAC SODIUM 75 MG PO TBEC: 75.0000 mg | DELAYED_RELEASE_TABLET | Freq: Two times a day (BID) | ORAL | 0 refills | Status: DC

## 2018-12-20 NOTE — Discharge Instructions (Signed)
You have been seen at the [facility] today for chest pain. Your evaluation today was not suggestive of any emergent condition requiring medical intervention at this time. Your ECG (heart tracing) did not show any worrisome changes. However, some medical problems make take more time to appear. Therefore, it's very important that you pay attention to any new symptoms or worsening of your current condition.  Please proceed directly to the Emergency Department immediately should you feel worse in any way or have any of the following symptoms: increasing or different chest pain, pain that spreads to your arm, neck, jaw, back or abdomen, shortness of breath, or nausea and vomiting.  

## 2018-12-20 NOTE — ED Triage Notes (Signed)
chest pain woke patient at 4 am this morning.  Patient says she woke sweating.  Pain occurs with movement, deep inspiration or twisting, pain is sharp

## 2018-12-20 NOTE — ED Provider Notes (Signed)
[name]   [id number] 12/20/18 Arrival Time: 4854  ASSESSMENT & PLAN:  1. Chest pain, unspecified type     Suspect chest wall pain. Discussed. Patient history and exam consistent with non-cardiac cause of chest pain. Prescription NSAIDs per medication orders.  ECG: Performed today and interpreted by me: normal EKG, normal sinus rhythm.  Begin trial of: Meds ordered this encounter  Medications  . diclofenac (VOLTAREN) 75 MG EC tablet    Sig: Take 1 tablet (75 mg total) by mouth 2 (two) times daily.    Dispense:  14 tablet    Refill:  0      Discharge Instructions     You have been seen at the [facility] today for chest pain. Your evaluation today was not suggestive of any emergent condition requiring medical intervention at this time. Your ECG (heart tracing) did not show any worrisome changes. However, some medical problems make take more time to appear. Therefore, it's very important that you pay attention to any new symptoms or worsening of your current condition.  Please proceed directly to the Emergency Department immediately should you feel worse in any way or have any of the following symptoms: increasing or different chest pain, pain that spreads to your arm, neck, jaw, back or abdomen, shortness of breath, or nausea and vomiting.       Chest pain precautions given. Reviewed expectations re: course of current medical issues. Questions answered. Outlined signs and symptoms indicating need for more acute intervention. Patient verbalized understanding. After Visit Summary given.   SUBJECTIVE:  History from: patient. Nina Phillips is a 28 y.o. female who presents with complaint of persistent right-sided chest pain described as sharp; without radiation. Onset abrupt, first noted approx 4 am this morning. Reports described symptoms are "easing off just a little" since beginning. No associated SOB/n/v. Does report feeling sweaty when she  woke; this has ceased. Does notice increase in pain with certain movements such as bending forward. No specific alleviating factors. Normal PO intake. No back pain. Injury or recent strenuous activity: none. Denies: exertional chest pressure/discomfort, fatigue, irregular heart beat, lower extremity edema, near-syncope, orthopnea, palpitations, paroxysmal nocturnal dyspnea, syncope and tachypnea. Recent illnesses: none. Fever: absent. Ambulatory without assistance. Self/OTC treatment: none; no new medications. Reports blood sugars have been WBL. History of similar: no. Illicit drug use: none.  Social History   Tobacco Use  Smoking Status Former Smoker  Smokeless Tobacco Never Used  Tobacco Comment   quit 2014   Social History   Substance and Sexual Activity  Alcohol Use No  . Frequency: Never   Comment: occas    ROS: As per HPI. All other systems negative.   OBJECTIVE:  Vitals:   12/20/18 1056  BP: (!) 139/92  Pulse: 71  Resp: 16  Temp: 98.1 F (36.7 C)  TempSrc: Oral  SpO2: 100%    General appearance: alert, oriented, no acute distress Eyes: PERRLA; EOMI; conjunctivae normal HENT: normocephalic; atraumatic Neck: supple with FROM Lungs: without labored respirations; CTAB Heart: regular rate and rhythm without murmer Chest Wall: reports TTP over R upper chest (reproduces and exacerbates pain described above) Abdomen: soft, non-tender; bowel sounds normal; no masses or organomegaly; no guarding or rebound tenderness Extremities: without edema; without calf swelling or tenderness; symmetrical without gross deformities Skin: warm and dry; without rash or lesions Psychological: alert and cooperative; normal mood and affect   Allergies  Allergen Reactions  . Apple Fruit Extract Hives  . Banana Hives  .  Other Hives    Walnuts   . Shellfish Allergy Hives  . Tomato Hives  . Watermelon [Citrullus Vulgaris] Hives  . Bactrim [Sulfamethoxazole-Trimethoprim] Hives and  Rash  . Sulfa Antibiotics Hives and Rash    Past Medical History:  Diagnosis Date  . Anemia   . Anxiety   . Bicornate uterus   . Depression    seeing a therapist since 3rd miscarriage, helping, doing good.  . Diabetes mellitus without complication (HCC)    Type 2  . Gout   . Ovarian cyst    Social History   Socioeconomic History  . Marital status: Single    Spouse name: Not on file  . Number of children: Not on file  . Years of education: Not on file  . Highest education level: Not on file  Occupational History  . Not on file  Social Needs  . Financial resource strain: Not on file  . Food insecurity    Worry: Not on file    Inability: Not on file  . Transportation needs    Medical: Not on file    Non-medical: Not on file  Tobacco Use  . Smoking status: Former Games developer  . Smokeless tobacco: Never Used  . Tobacco comment: quit 2014  Substance and Sexual Activity  . Alcohol use: No    Frequency: Never    Comment: occas  . Drug use: No  . Sexual activity: Yes    Birth control/protection: None  Lifestyle  . Physical activity    Days per week: Not on file    Minutes per session: Not on file  . Stress: Not on file  Relationships  . Social Musician on phone: Not on file    Gets together: Not on file    Attends religious service: Not on file    Active member of club or organization: Not on file    Attends meetings of clubs or organizations: Not on file    Relationship status: Not on file  . Intimate partner violence    Fear of current or ex partner: Not on file    Emotionally abused: Not on file    Physically abused: Not on file    Forced sexual activity: Not on file  Other Topics Concern  . Not on file  Social History Narrative  . Not on file   Family History  Problem Relation Age of Onset  . Cancer Other   . Hypertension Other   . Diabetes Other   . Hypertension Mother   . Gout Mother   . Diabetes Father   . Hypertension Father   .  Stroke Father   . Hypertension Maternal Grandfather   . Heart attack Maternal Grandfather   . Stroke Paternal Grandmother   . Hypertension Maternal Aunt   . Cancer Maternal Grandmother        breast  . Diabetes Paternal Grandfather    Past Surgical History:  Procedure Laterality Date  . DILATION AND CURETTAGE OF UTERUS    . WISDOM TOOTH EXTRACTION       Mardella Layman, MD 12/20/18 1122

## 2019-01-26 NOTE — L&D Delivery Note (Signed)
Nursing staff asked me to come and eval as there was something between her legs.  Found sac + fetus + placenta delivered en caul. Sac cut open and cord clamped. Non-viable female infant noted, wrapped up and taken to mom. Segment of cord taken for Anora genetic testing.   Family wants autopsy. No bleeding. No further intervention given (no Pitocin, as placenta was delivered and was not bleeding)  Mom is recovering stable.

## 2019-01-27 ENCOUNTER — Other Ambulatory Visit: Payer: Self-pay

## 2019-01-27 ENCOUNTER — Encounter: Payer: Self-pay | Admitting: Emergency Medicine

## 2019-01-27 ENCOUNTER — Ambulatory Visit
Admission: EM | Admit: 2019-01-27 | Discharge: 2019-01-27 | Disposition: A | Discharge status: Home / Self Care | Payer: Self-pay | Attending: Emergency Medicine | Admitting: Emergency Medicine

## 2019-01-27 ENCOUNTER — Ambulatory Visit (INDEPENDENT_AMBULATORY_CARE_PROVIDER_SITE_OTHER): Payer: Self-pay

## 2019-01-27 DIAGNOSIS — M545 Low back pain: Secondary | ICD-10-CM

## 2019-01-27 DIAGNOSIS — M62830 Muscle spasm of back: Secondary | ICD-10-CM

## 2019-01-27 DIAGNOSIS — S39012A Strain of muscle, fascia and tendon of lower back, initial encounter: Secondary | ICD-10-CM

## 2019-01-27 MED ORDER — NAPROXEN 500 MG PO TABS: 500.0000 mg | ORAL_TABLET | Freq: Two times a day (BID) | ORAL | 0 refills | Status: DC

## 2019-01-27 MED ORDER — CYCLOBENZAPRINE HCL 5 MG PO TABS: 5.0000 mg | ORAL_TABLET | Freq: Two times a day (BID) | ORAL | 0 refills | Status: AC | PRN

## 2019-01-27 NOTE — ED Triage Notes (Signed)
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  Began experiencing bilateral leg burning.  Patient states she has been having lower back pain for a while due to having to lift patients by herself, but today has been the worse.

## 2019-01-27 NOTE — Discharge Instructions (Addendum)
Recommend RICE: rest, ice, compression, elevation as needed for pain.    Heat therapy (hot compress, warm wash red, hot showers, etc.) can help relax muscles and soothe muscle aches. Cold therapy (ice packs) can be used to help swelling both after injury and after prolonged use of areas of chronic pain/aches.  For pain: take naprosyn only May take muscle relaxer as needed for severe pain / spasm.  (This medication may cause you to become tired so it is important you do not drink alcohol or operate heavy machinery while on this medication.  Recommend your first dose to be taken before bedtime to monitor for side effects safely)  Return for worsening back pain, numbness in legs, numbness in bathing suit area, change in bowel or bladder habit, fever.

## 2019-01-27 NOTE — ED Provider Notes (Signed)
[name]    CSN: [id number] Arrival date & time: 01/27/19  0762      History   Chief Complaint Chief Complaint  Patient presents with  . Back Pain    HPI Nina Phillips is a 29 y.o. female with history of diabetes, gout presenting for low back pain since this morning.  States pain was sudden onset after lifting a patient this morning; states she felt a pop in her back with pain that shot down her legs bilaterally.  Denies saddle anesthesia, change in bowel or bladder out.  Has never had this happen before.  Does not take anything for pain yet as it happened approximately 45 minutes PTA.  It is worse with certain movements.  Past Medical History:  Diagnosis Date  . Anemia   . Anxiety   . Bicornate uterus   . Depression    seeing a therapist since 3rd miscarriage, helping, doing good.  . Diabetes mellitus without complication (HCC)    Type 2  . Gout   . Ovarian cyst     Patient Active Problem List   Diagnosis Date Noted  . Trichomonas infection 08/24/2018  . GBS bacteriuria 08/20/2018  . 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 New onset type 2 diabetes mellitus (HCC) 07/17/2018  . Hyperglycemia 07/17/2018  . 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 DILATION AND CURETTAGE OF UTERUS    . WISDOM TOOTH EXTRACTION      OB History    Gravida  4   Para      Term      Preterm      AB  3   Living  0     SAB  3   TAB      Ectopic      Multiple  0   Live Births               Home Medications    Prior to Admission medications   Medication Sig Start Date End Date Taking? Authorizing Provider  Accu-Chek FastClix Lancets MISC USE TO CHECK BLOOD SUGAR FOUR TIMES DAILY AND AS NEEDED 07/17/18   [provider]  ACCU-CHEK GUIDE test strip USE 1 STRIP 4 TIMES DAILY AND AS NEEDED 07/17/18   [provider]  acetaminophen (TYLENOL) 500 MG tablet Take 500-1,000 mg by mouth every 6 (six) hours as needed for mild pain, moderate pain or headache.     [provider]  BD PEN NEEDLE NANO U/F 32G X 4 MM MISC USE ONE NEEDLE DAILY WITH INSULIN INJECTION 07/17/18   [provider]  cyclobenzaprine (FLEXERIL) 5 MG tablet Take 1 tablet (5 mg total) by mouth 2 (two) times daily as needed for up to 5 days for muscle spasms. 01/27/19 02/01/19  Hall-Potvin, [place], PA-C  diclofenac (VOLTAREN) 75 MG EC tablet Take 1 tablet (75 mg total) by mouth 2 (two) times daily. 12/20/18   Mardella Layman, MD  erythromycin ophthalmic ointment Place a 1/2 inch ribbon of ointment into both eyes 4 times a day for 7 days 12/09/18   Belinda Fisher, PA-C  ferrous sulfate 325 (65 FE) MG tablet Take 1 tablet (325 mg total) by mouth daily. 08/16/18 08/16/19  Nugent, Odie Sera, NP  indomethacin (INDOCIN) 50 MG capsule Take 1 capsule (50 mg total) by mouth 3 (three) times daily as needed for moderate  pain. Decrease frequency to stop as pain resolves 12/11/18   Linus Mako B, NP  Insulin Glargine (LANTUS SOLOSTAR) 100 UNIT/ML Solostar Pen Inject 15 Units into the skin daily. 08/14/18   Bing Neighbors, FNP  metFORMIN (GLUCOPHAGE) 1000 MG tablet Take 1 tablet (1,000 mg total) by mouth 2 (two) times daily with a meal. 09/07/18   Shamleffer, Konrad Dolores, MD  naproxen (NAPROSYN) 500 MG tablet Take 1 tablet (500 mg total) by mouth 2 (two) times daily. 01/27/19   Hall-Potvin, [place], PA-C  diphenhydrAMINE (BENADRYL) 25 mg capsule Take 25 mg by mouth every 6 (six) hours as needed for allergies (and allergic reactions).   12/20/18  [provider]    Family History Family History  Problem Relation Age of Onset  . Cancer Other   . Hypertension Other   . Diabetes Other   . Hypertension Mother   . Gout Mother   . Diabetes Father   . Hypertension Father   . Stroke Father   . Hypertension Maternal Grandfather     . Heart attack Maternal Grandfather   . Stroke Paternal Grandmother   . Hypertension Maternal Aunt   . Cancer Maternal Grandmother        breast  . Diabetes Paternal Grandfather     Social History Social History   Tobacco Use  . Smoking status: Former Games developer  . Smokeless tobacco: Never Used  . Tobacco comment: quit 2014  Substance Use Topics  . Alcohol use: No    Comment: occas  . Drug use: No     Allergies   Apple fruit extract, Banana, Other, Shellfish allergy, Tomato, Watermelon [citrullus vulgaris], Bactrim [sulfamethoxazole-trimethoprim], and Sulfa antibiotics   Review of Systems Review of Systems  Constitutional: Negative for fatigue and fever.  Respiratory: Negative for cough and shortness of breath.   Cardiovascular: Negative for chest pain and palpitations.  Musculoskeletal:       Positive for low back pain  Neurological: Negative for weakness and numbness.     Physical Exam Triage Vital Signs ED Triage Vitals  Enc Vitals Group     BP      Pulse      Resp      Temp      Temp src      SpO2      Weight      Height      Head Circumference      Peak Flow      Pain Score      Pain Loc      Pain Edu?      Excl. in GC?    No data found.  Updated Vital Signs BP 138/84 (BP Location: Left Arm)   Pulse 72   Temp (!) 97.2 F (36.2 C) (Temporal)   Resp 18   LMP 12/28/2018   SpO2 99%   Visual Acuity Right Eye Distance:   Left Eye Distance:   Bilateral Distance:    Right Eye Near:   Left Eye Near:    Bilateral Near:     Physical Exam Constitutional:      General: She is not in acute distress. HENT:     Head: Normocephalic and atraumatic.  Eyes:     General: No scleral icterus.    Pupils: Pupils are equal, round, and reactive to light.  Cardiovascular:     Rate and Rhythm: Normal rate.  Pulmonary:     Effort: Pulmonary effort is normal.  Musculoskeletal:  Cervical back: Normal.     Thoracic back: Normal.     Lumbar back:  Tenderness and bony tenderness present. No swelling, deformity or spasms. Decreased range of motion. Positive right straight leg raise test and positive left straight leg raise test. No scoliosis.     Comments: Decreased ROM second to pain.  Patient diffusely tender over low back sparing PSIS bilaterally.  Does have spinous process tenderness diffusely through lumbar spine without  Skin:    Coloration: Skin is not jaundiced or pale.  Neurological:     Mental Status: She is alert and oriented to person, place, and time.      UC Treatments / Results  Labs (all labs ordered are listed, but only abnormal results are displayed) Labs Reviewed - No data to display  EKG   Radiology DG Lumbar Spine Complete  Result Date: 01/27/2019 CLINICAL DATA:  Back pain after lifting a patient. EXAM: LUMBAR SPINE - COMPLETE 4+ VIEW COMPARISON:  None. FINDINGS: There are 5 non rib-bearing lumbar type vertebral bodies. Mild straightening of the expected lumbar lordosis. No anterolisthesis or retrolisthesis. No pars defects. Lumbar vertebral body heights are preserved. Lumbar intervertebral disc space heights are preserved. Limited visualization of the bilateral SI joints and hips is normal. Regional bowel gas pattern and soft tissues are normal. IMPRESSION: Mild straightening of the expected lumbar lordosis, nonspecific though could be seen in the setting of muscle spasm. Otherwise, no explanation for patient's back pain. Electronically Signed   By: Sandi Mariscal M.D.   On: 01/27/2019 08:59    Procedures Procedures (including critical care time)  Medications Ordered in UC Medications - No data to display  Initial Impression / Assessment and Plan / UC Course  I have reviewed the triage vital signs and the nursing notes.  Pertinent labs & imaging results that were available during my care of the patient were reviewed by me and considered in my medical decision making (see chart for details).     Lumbar x-ray  obtained in office, reviewed by me and radiology: Lumbar disc space heights preserved, no obvious fracture, dislocation.  Nonspecific straightening of expected lumbar lordosis which is nonspecific, though suggestive of muscle spasm.  Reviewed findings with patient who verbalized understanding.  Will practice RICE, add muscle relaxer as outlined below.  Low back rehab exercises provided as well as contacting for sports med.  Work note provided.  Return precautions discussed, patient verbalized understanding and is agreeable to plan. Final Clinical Impressions(s) / UC Diagnoses   Final diagnoses:  Lumbar strain, initial encounter     Discharge Instructions     Recommend RICE: rest, ice, compression, elevation as needed for pain.    Heat therapy (hot compress, warm wash red, hot showers, etc.) can help relax muscles and soothe muscle aches. Cold therapy (ice packs) can be used to help swelling both after injury and after prolonged use of areas of chronic pain/aches.  For pain: take naprosyn only May take muscle relaxer as needed for severe pain / spasm.  (This medication may cause you to become tired so it is important you do not drink alcohol or operate heavy machinery while on this medication.  Recommend your first dose to be taken before bedtime to monitor for side effects safely)  Return for worsening back pain, numbness in legs, numbness in bathing suit area, change in bowel or bladder habit, fever.    ED Prescriptions    Medication Sig Dispense Auth. Provider   naproxen (NAPROSYN) 500 MG  tablet Take 1 tablet (500 mg total) by mouth 2 (two) times daily. 30 tablet Hall-Potvin, [place], PA-C   cyclobenzaprine (FLEXERIL) 5 MG tablet Take 1 tablet (5 mg total) by mouth 2 (two) times daily as needed for up to 5 days for muscle spasms. 10 tablet Hall-Potvin, [place], PA-C     PDMP not reviewed this encounter.   Hall-Potvin, [place], [place] 01/27/19 [phone]

## 2019-03-08 ENCOUNTER — Telehealth: Payer: Self-pay

## 2019-03-08 NOTE — Telephone Encounter (Signed)
Patient called and states that she has been on metformin 1000 mg BID and has been having stomach issues vomiting, diarrhea and is struggling with taking.  She thinks it is to strong.  Please advise on a change in therapy.    Pharmacy [facility] [address]

## 2019-03-09 NOTE — Telephone Encounter (Signed)
Please advise 

## 2019-03-09 NOTE — Telephone Encounter (Signed)
She can use it just once a day and see if that makes a difference. If she still has issues, then she needs to stop it completely and will need an OV to discuss alternatives

## 2019-03-09 NOTE — Telephone Encounter (Signed)
Lft vm for pt  with these instructions

## 2019-04-20 ENCOUNTER — Ambulatory Visit
Admission: EM | Admit: 2019-04-20 | Discharge: 2019-04-20 | Disposition: A | Discharge status: Home / Self Care | Payer: Self-pay | Attending: Family Medicine | Admitting: Family Medicine

## 2019-04-20 DIAGNOSIS — M1A0710 Idiopathic chronic gout, right ankle and foot, without tophus (tophi): Secondary | ICD-10-CM

## 2019-04-20 MED ORDER — KETOROLAC TROMETHAMINE 30 MG/ML IJ SOLN
30.0000 mg | Freq: Once | INTRAMUSCULAR | Status: AC
  Administered 2019-04-20: 30 mg via INTRAMUSCULAR

## 2019-04-20 MED ORDER — METHYLPREDNISOLONE SODIUM SUCC 125 MG IJ SOLR
80.0000 mg | Freq: Once | INTRAMUSCULAR | Status: AC
  Administered 2019-04-20: 14:00:00 80 mg via INTRAMUSCULAR

## 2019-04-20 MED ORDER — INDOMETHACIN 25 MG PO CAPS: 25.0000 mg | ORAL_CAPSULE | Freq: Three times a day (TID) | ORAL | 0 refills | Status: DC | PRN

## 2019-04-20 NOTE — ED Provider Notes (Signed)
[name]    CSN: [id number] Arrival date & time: 04/20/19  1313      History   Chief Complaint Chief Complaint  Patient presents with  . Gout    HPI Nina Phillips is a 29 y.o. female.   Patient is a 29 year old female that presents today with chronic gout.  She is here today for flareup that started today.  The flare is in her right ankle.  She has had severe pain and swelling.  She is not take anything for the pain.  Was seen here back in November 2020 for similar.  Has had gout reported since she was 29 years old. No injuries to the foot or ankle.  No fever.  ROS per HPI      Past Medical History:  Diagnosis Date  . Anemia   . Anxiety   . Bicornate uterus   . Depression    seeing a therapist since 3rd miscarriage, helping, doing good.  . Diabetes mellitus without complication (HCC)    Type 2  . Gout   . Ovarian cyst     Patient Active Problem List   Diagnosis Date Noted  . Trichomonas infection 08/24/2018  . GBS bacteriuria 08/20/2018  . 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 New onset type 2 diabetes mellitus ([place]) 07/17/2018  . Hyperglycemia 07/17/2018  . 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 DILATION AND CURETTAGE OF UTERUS    . WISDOM TOOTH EXTRACTION      OB History    Gravida  4   Para      Term      Preterm      AB  3   Living  0     SAB  3   TAB      Ectopic      Multiple  0   Live Births               Home Medications    Prior to Admission medications   Medication Sig Start Date End Date Taking? Authorizing Provider  Accu-Chek FastClix Lancets MISC USE TO CHECK BLOOD SUGAR FOUR TIMES DAILY AND AS NEEDED 07/17/18   [provider]  ACCU-CHEK GUIDE test strip USE 1 STRIP 4 TIMES DAILY AND AS NEEDED 07/17/18   [provider]  acetaminophen  (TYLENOL) 500 MG tablet Take 500-1,000 mg by mouth every 6 (six) hours as needed for mild pain, moderate pain or headache.     [provider]  BD PEN NEEDLE NANO U/F 32G X 4 MM MISC USE ONE NEEDLE DAILY WITH INSULIN INJECTION 07/17/18   [provider]  diclofenac (VOLTAREN) 75 MG EC tablet Take 1 tablet (75 mg total) by mouth 2 (two) times daily. 12/20/18   Vanessa Kick, MD  erythromycin ophthalmic ointment Place a 1/2 inch ribbon of ointment into both eyes 4 times a day for 7 days 12/09/18   Ok Edwards, PA-C  ferrous sulfate 325 (65 FE) MG tablet Take 1 tablet (325 mg total) by mouth daily. 08/16/18 08/16/19  Nugent, Gerrie Nordmann, NP  indomethacin (INDOCIN) 25 MG capsule Take 1 capsule (25 mg total) by mouth 3 (three) times daily as needed. 04/20/19   Loura Halt A, NP  Insulin Glargine (LANTUS SOLOSTAR) 100 UNIT/ML Solostar Pen Inject 15 Units into the skin daily. 08/14/18   Scot Jun, FNP  metFORMIN (GLUCOPHAGE) 1000 MG tablet Take 1 tablet (1,000 mg total) by mouth 2 (two) times daily with a meal. 09/07/18   Shamleffer, Konrad Dolores, MD  naproxen (NAPROSYN) 500 MG tablet Take 1 tablet (500 mg total) by mouth 2 (two) times daily. 01/27/19   Hall-Potvin, [place], PA-C  diphenhydrAMINE (BENADRYL) 25 mg capsule Take 25 mg by mouth every 6 (six) hours as needed for allergies (and allergic reactions).   12/20/18  [provider]    Family History Family History  Problem Relation Age of Onset  . Cancer Other   . Hypertension Other   . Diabetes Other   . Hypertension Mother   . Gout Mother   . Diabetes Father   . Hypertension Father   . Stroke Father   . Hypertension Maternal Grandfather   . Heart attack Maternal Grandfather   . Stroke Paternal Grandmother   . Hypertension Maternal Aunt   . Cancer Maternal Grandmother        breast  . Diabetes Paternal Grandfather     Social History Social History   Tobacco Use  . Smoking status: Former Games developer  .  Smokeless tobacco: Never Used  . Tobacco comment: quit 2014  Substance Use Topics  . Alcohol use: No    Comment: occas  . Drug use: No     Allergies   Apple fruit extract, Banana, Other, Shellfish allergy, Tomato, Watermelon [citrullus vulgaris], Bactrim [sulfamethoxazole-trimethoprim], and Sulfa antibiotics   Review of Systems Review of Systems   Physical Exam Triage Vital Signs ED Triage Vitals  Enc Vitals Group     BP 04/20/19 1332 (!) 149/74     Pulse Rate 04/20/19 1332 80     Resp 04/20/19 1332 (!) 24     Temp 04/20/19 1332 98.1 F (36.7 C)     Temp Source 04/20/19 1332 Oral     SpO2 04/20/19 1332 99 %     Weight --      Height --      Head Circumference --      Peak Flow --      Pain Score 04/20/19 1341 10     Pain Loc --      Pain Edu? --      Excl. in GC? --    No data found.  Updated Vital Signs BP (!) 149/74 (BP Location: Left Arm)   Pulse 80   Temp 98.1 F (36.7 C) (Oral)   Resp (!) 24   LMP 03/29/2019   SpO2 99%   Visual Acuity Right Eye Distance:   Left Eye Distance:   Bilateral Distance:    Right Eye Near:   Left Eye Near:    Bilateral Near:     Physical Exam Vitals and nursing note reviewed.  Constitutional:      Comments: Crying in pain   HENT:     Head: Normocephalic.     Nose: Nose normal.  Eyes:     Conjunctiva/sclera: Conjunctivae normal.  Pulmonary:     Effort: Pulmonary effort is normal.  Musculoskeletal:        General: Swelling and tenderness present.     Cervical back: Normal range of motion.     Comments: Moderate swelling and tenderness to right lateral malleolus area  Skin:    General: Skin is warm and dry.     Findings: No rash.  Neurological:     Mental Status: She is alert.  Psychiatric:        Mood and  Affect: Mood normal.      UC Treatments / Results  Labs (all labs ordered are listed, but only abnormal results are displayed) Labs Reviewed - No data to display  EKG   Radiology No results  found.  Procedures Procedures (including critical care time)  Medications Ordered in UC Medications  ketorolac (TORADOL) 30 MG/ML injection 30 mg (30 mg Intramuscular Given 04/20/19 1401)  methylPREDNISolone sodium succinate (SOLU-MEDROL) 125 mg/2 mL injection 80 mg (80 mg Intramuscular Given 04/20/19 1402)    Initial Impression / Assessment and Plan / UC Course  I have reviewed the triage vital signs and the nursing notes.  Pertinent labs & imaging results that were available during my care of the patient were reviewed by me and considered in my medical decision making (see chart for details).     Gout-treating with prednisone injection and Toradol here for severe pain. Refill for indomethacin sent to the pharmacy to use as needed Follow up as needed for continued or worsening symptoms  Final Clinical Impressions(s) / UC Diagnoses   Final diagnoses:  Chronic gout of right ankle, unspecified cause     Discharge Instructions     Treating you for gout  Steroid injection given here and Toradol Refill for indomethacin sent to the pharmacy.  Follow up as needed for continued or worsening symptoms     ED Prescriptions    Medication Sig Dispense Auth. Provider   indomethacin (INDOCIN) 25 MG capsule Take 1 capsule (25 mg total) by mouth 3 (three) times daily as needed. 30 capsule Catherine Cubero A, NP     PDMP not reviewed this encounter.   Janace Aris, NP 04/20/19 1426

## 2019-04-20 NOTE — Discharge Instructions (Addendum)
Treating you for gout  Steroid injection given here and Toradol Refill for indomethacin sent to the pharmacy.  Follow up as needed for continued or worsening symptoms

## 2019-04-20 NOTE — ED Triage Notes (Signed)
Pt c/o gout to rt ankle that started this am. States has had gout since she was 29yrs old, last flare up was 30yrs ago.

## 2019-05-26 ENCOUNTER — Ambulatory Visit (INDEPENDENT_AMBULATORY_CARE_PROVIDER_SITE_OTHER)
Admission: RE | Admit: 2019-05-26 | Discharge: 2019-05-26 | Disposition: A | Discharge status: Home / Self Care | Payer: 59 | Source: Ambulatory Visit

## 2019-05-26 DIAGNOSIS — K529 Noninfective gastroenteritis and colitis, unspecified: Secondary | ICD-10-CM

## 2019-05-26 DIAGNOSIS — R197 Diarrhea, unspecified: Secondary | ICD-10-CM | POA: Diagnosis not present

## 2019-05-26 DIAGNOSIS — R112 Nausea with vomiting, unspecified: Secondary | ICD-10-CM

## 2019-05-26 NOTE — ED Provider Notes (Signed)
[facility]    CSN: [id number] Arrival date & time: 05/26/19  1528      History   Chief Complaint No chief complaint on file.   HPI Nina Phillips is a 29 y.o. female.   This visit was conducted over audiovisual media in accordance with Covid distancing guidelines.  Instructed patient that there may be issues that we may not be able to address via video.  This nurse practitioner Moshe Cipro was in [place] at the [facility] urgent care campus and the patient was in her own home.  Would like to continue with video.  Due to the nature of this visit no physical exam was able to be performed.  Reports that she and her boyfriend ate dinner out last night and they have both been sick today with nausea, vomiting, diarrhea.  She reports that she has tried taking Imodium, with some relief of the diarrhea but still having nausea and vomiting.  Reports that she is able to keep some liquids down.  Denies fever, shortness of breath, rash, other symptoms.  Per chart review, patient has history significant for type 2 diabetes.  ROS per HPI  The history is provided by the patient.    Past Medical History:  Diagnosis Date  . Anemia   . Anxiety   . Bicornate uterus   . Depression    seeing a therapist since 3rd miscarriage, helping, doing good.  . Diabetes mellitus without complication (HCC)    Type 2  . Gout   . Ovarian cyst     Patient Active Problem List   Diagnosis Date Noted  . Trichomonas infection 08/24/2018  . GBS bacteriuria 08/20/2018  . 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 New onset type 2 diabetes mellitus (HCC) 07/17/2018  . Hyperglycemia 07/17/2018  . 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 DILATION AND CURETTAGE OF UTERUS    . WISDOM TOOTH EXTRACTION      OB History    Gravida  4   Para      Term      Preterm      AB  3   Living  0     SAB  3   TAB      Ectopic      Multiple  0   Live Births               Home Medications    Prior to Admission medications   Medication Sig Start Date End Date Taking? Authorizing Provider  Accu-Chek FastClix Lancets MISC USE TO CHECK BLOOD SUGAR FOUR TIMES DAILY AND AS NEEDED 07/17/18   [provider]  ACCU-CHEK GUIDE test strip USE 1 STRIP 4 TIMES DAILY AND AS NEEDED 07/17/18   [provider]  acetaminophen (TYLENOL) 500 MG tablet Take 500-1,000 mg by mouth every 6 (six) hours as needed for mild pain, moderate pain or headache.     [provider]  BD PEN NEEDLE NANO U/F 32G X 4 MM MISC USE ONE NEEDLE DAILY WITH INSULIN INJECTION 07/17/18   [provider]  diclofenac (VOLTAREN) 75 MG EC tablet Take 1 tablet (75 mg total) by mouth 2 (two) times daily. 12/20/18   Mardella Layman, MD  erythromycin ophthalmic ointment Place a 1/2 inch ribbon of ointment into both eyes 4 times a day for 7 days 12/09/18   Linward Headland  V, PA-C  ferrous sulfate 325 (65 FE) MG tablet Take 1 tablet (325 mg total) by mouth daily. 08/16/18 08/16/19  Nugent, Gerrie Nordmann, NP  indomethacin (INDOCIN) 25 MG capsule Take 1 capsule (25 mg total) by mouth 3 (three) times daily as needed. 04/20/19   Loura Halt A, NP  Insulin Glargine (LANTUS SOLOSTAR) 100 UNIT/ML Solostar Pen Inject 15 Units into the skin daily. 08/14/18   Scot Jun, FNP  metFORMIN (GLUCOPHAGE) 1000 MG tablet Take 1 tablet (1,000 mg total) by mouth 2 (two) times daily with a meal. 09/07/18   Shamleffer, Melanie Crazier, MD  naproxen (NAPROSYN) 500 MG tablet Take 1 tablet (500 mg total) by mouth 2 (two) times daily. 01/27/19   Hall-Potvin, [place], PA-C  diphenhydrAMINE (BENADRYL) 25 mg capsule Take 25 mg by mouth every 6 (six) hours as needed for allergies (and allergic reactions).   12/20/18  [provider]    Family History Family History  Problem Relation Age of  Onset  . Cancer Other   . Hypertension Other   . Diabetes Other   . Hypertension Mother   . Gout Mother   . Diabetes Father   . Hypertension Father   . Stroke Father   . Hypertension Maternal Grandfather   . Heart attack Maternal Grandfather   . Stroke Paternal Grandmother   . Hypertension Maternal Aunt   . Cancer Maternal Grandmother        breast  . Diabetes Paternal Grandfather     Social History Social History   Tobacco Use  . Smoking status: Former Research scientist (life sciences)  . Smokeless tobacco: Never Used  . Tobacco comment: quit 2014  Substance Use Topics  . Alcohol use: No    Comment: occas  . Drug use: No     Allergies   Apple fruit extract, Banana, Other, Shellfish allergy, Tomato, Watermelon [citrullus vulgaris], Bactrim [sulfamethoxazole-trimethoprim], and Sulfa antibiotics   Review of Systems Review of Systems   Physical Exam Triage Vital Signs ED Triage Vitals  Enc Vitals Group     BP      Pulse      Resp      Temp      Temp src      SpO2      Weight      Height      Head Circumference      Peak Flow      Pain Score      Pain Loc      Pain Edu?      Excl. in [place]?    No data found.  Updated Vital Signs There were no vitals taken for this visit.  Visual Acuity Right Eye Distance:   Left Eye Distance:   Bilateral Distance:    Right Eye Near:   Left Eye Near:    Bilateral Near:     Physical Exam Constitutional:      General: She is in acute distress.  Neurological:     Mental Status: She is alert.      UC Treatments / Results  Labs (all labs ordered are listed, but only abnormal results are displayed) Labs Reviewed - No data to display  EKG   Radiology No results found.  Procedures Procedures (including critical care time)  Medications Ordered in UC Medications - No data to display  Initial Impression / Assessment and Plan / UC Course  I have reviewed the triage vital signs and the nursing notes.  Pertinent labs & imaging  results that were available during my care of the patient were reviewed by me and considered in my medical decision making (see chart for details).     Nausea Vomiting Diarrhea Gastroenteritis: Presents with nausea, vomiting, diarrhea since last night after eating out at a restaurant.  Has used Imodium and that has relieved some diarrhea.  Will prescribe Zofran 4 mg every 8 hours as needed for nausea and vomiting.  Instructed patient to drink plenty of fluids and keep yourself hydrated.  Instructed patient to report to the ER if she is worried about becoming dehydrated, if she loses consciousness, if she loses control of her blood sugar she needs to go to the ER as well.  Patient verbalized understanding is in agreement treatment plan.  Spent 10 minutes nonface-to-face time with this encounter. Final Clinical Impressions(s) / UC Diagnoses   Final diagnoses:  Nausea and vomiting, intractability of vomiting not specified, unspecified vomiting type  Diarrhea, unspecified type  Noninfectious gastroenteritis, unspecified type     Discharge Instructions     I have sent in Zofran to your pharmacy for you to take every 6-8 hours as needed for nausea and vomiting.  If you are not feeling better over the next 1 to 2 days, I would have you come be evaluated in person, you may need IV fluid replacement.    ED Prescriptions    None     PDMP not reviewed this encounter.   Moshe Cipro, NP 05/29/19 1152

## 2019-05-26 NOTE — Discharge Instructions (Signed)
I have sent in Zofran to your pharmacy for you to take every 6-8 hours as needed for nausea and vomiting.  If you are not feeling better over the next 1 to 2 days, I would have you come be evaluated in person, you may need IV fluid replacement.

## 2019-06-20 ENCOUNTER — Other Ambulatory Visit: Payer: Self-pay

## 2019-06-21 ENCOUNTER — Telehealth: Payer: Self-pay | Admitting: Internal Medicine

## 2019-06-22 ENCOUNTER — Other Ambulatory Visit: Payer: Self-pay

## 2019-06-22 ENCOUNTER — Ambulatory Visit (INDEPENDENT_AMBULATORY_CARE_PROVIDER_SITE_OTHER): Payer: 59 | Admitting: Internal Medicine

## 2019-06-22 ENCOUNTER — Encounter: Payer: Self-pay | Admitting: Internal Medicine

## 2019-06-22 VITALS — BP 142/82 | HR 97 | Temp 98.7°F | Ht 63.0 in | Wt 244.2 lb

## 2019-06-22 DIAGNOSIS — E1165 Type 2 diabetes mellitus with hyperglycemia: Secondary | ICD-10-CM

## 2019-06-22 DIAGNOSIS — E1129 Type 2 diabetes mellitus with other diabetic kidney complication: Secondary | ICD-10-CM

## 2019-06-22 DIAGNOSIS — R809 Proteinuria, unspecified: Secondary | ICD-10-CM

## 2019-06-22 DIAGNOSIS — Z794 Long term (current) use of insulin: Secondary | ICD-10-CM

## 2019-06-22 LAB — GLUCOSE, POCT (MANUAL RESULT ENTRY): POC Glucose: 130 mg/dL — AB (ref 70–99)

## 2019-06-22 LAB — POCT GLYCOSYLATED HEMOGLOBIN (HGB A1C): Hemoglobin A1C: 9.1 % — AB (ref 4.0–5.6)

## 2019-06-22 MED ORDER — LANTUS SOLOSTAR 100 UNIT/ML ~~LOC~~ SOPN: 20.0000 [IU] | PEN_INJECTOR | Freq: Every day | SUBCUTANEOUS | 6 refills | Status: DC

## 2019-06-22 MED ORDER — CANAGLIFLOZIN 100 MG PO TABS: 100.0000 mg | ORAL_TABLET | Freq: Every day | ORAL | 4 refills | Status: DC

## 2019-06-22 NOTE — Patient Instructions (Addendum)
-   Start Invokana 100 mg daily  - Continue Metformin 1000 mg, Half a tablet twice a day - Increase Lantus to 20 units daily       HOW TO TREAT LOW BLOOD SUGARS (Blood sugar LESS THAN 70 MG/DL)  Please follow the RULE OF 15 for the treatment of hypoglycemia treatment (when your (blood sugars are less than 70 mg/dL)    STEP 1: Take 15 grams of carbohydrates when your blood sugar is low, which includes:   3-4 GLUCOSE TABS  OR  3-4 OZ OF JUICE OR REGULAR SODA OR  ONE TUBE OF GLUCOSE GEL     STEP 2: RECHECK blood sugar in 15 MINUTES STEP 3: If your blood sugar is still low at the 15 minute recheck --> then, go back to STEP 1 and treat AGAIN with another 15 grams of carbohydrates.

## 2019-06-22 NOTE — Progress Notes (Signed)
Name: Nina Phillips  Age/ Sex: 29 y.o., female   MRN/ DOB: [id number], 1991-01-14     PCP: Bing Neighbors, FNP   Reason for Endocrinology Evaluation: Type 2 Diabetes Mellitus  Initial Endocrine Consultative Visit:  09/08/2018    PATIENT IDENTIFIER: Nina Phillips is a 29 y.o. female with a past medical history of T2DM. The patient has followed with Endocrinology clinic since 09/08/2018 for consultative assistance with management of her diabetes.  DIABETIC HISTORY:  Nina Phillips was diagnosed with T2 DM in 06/2018. Her hemoglobin A1c has ranged from 10.6% on her initial diagnosis.  On her initial visit to our clinic she had an A1c of 10.3%, she was on Lantus only, we started Metformin.    She had ~ 5 miscarriages in 2020, the last one was 07/2018 Lives with boyfriend and his grandmother  SUBJECTIVE:   During the last visit (09/07/2018): A1c 10.3%. Lantus continued and added metformin.   Today (06/26/2019): Nina Phillips is here for a follow up on diabetes. She has not been seen in almost a year due to loss of insurance.  She checks her blood sugars 2 times daily, but forgot to bring today. The patient has not had hypoglycemic episodes since the last clinic visit.      HOME DIABETES REGIMEN:  Metformin 1000 tablet, half a tablet twice a day  Lantus 18 units daily      METER DOWNLOAD SUMMARY: Did not bring meter     DIABETIC COMPLICATIONS: Microvascular complications:    Denies: CKD, Neuropathy, retinopathy   Last Eye Exam: Completed   Macrovascular complications:    Denies: CAD, CVA, PVD   HISTORY:  Past Medical History:  Past Medical History:  Diagnosis Date  . Anemia   . Anxiety   . Bicornate uterus   . Depression    seeing a therapist since 3rd miscarriage, helping, doing good.  . Diabetes mellitus without complication (HCC)    Type 2  . Gout   . Ovarian cyst    Past Surgical History:  Past Surgical History:  Procedure Laterality Date  .  DILATION AND CURETTAGE OF UTERUS    . WISDOM TOOTH EXTRACTION      Social History:  reports that she has quit smoking. She has never used smokeless tobacco. She reports that she does not drink alcohol or use drugs. Family History:  Family History  Problem Relation Age of Onset  . Cancer Other   . Hypertension Other   . Diabetes Other   . Hypertension Mother   . Gout Mother   . Diabetes Father   . Hypertension Father   . Stroke Father   . Hypertension Maternal Grandfather   . Heart attack Maternal Grandfather   . Stroke Paternal Grandmother   . Hypertension Maternal Aunt   . Cancer Maternal Grandmother        breast  . Diabetes Paternal Grandfather      HOME MEDICATIONS: Allergies as of 06/22/2019      Reactions   Apple Fruit Extract Hives   Banana Hives   Other Hives   Walnuts   Shellfish Allergy Hives   Tomato Hives   Watermelon [citrullus Vulgaris] Hives   Bactrim [sulfamethoxazole-trimethoprim] Hives, Rash   Sulfa Antibiotics Hives, Rash      Medication List       Accurate as of Jun 22, 2019 11:59 PM. If you have any questions, ask your nurse or doctor.        Accu-Chek Kellogg  Lancets Misc USE TO CHECK BLOOD SUGAR FOUR TIMES DAILY AND AS NEEDED   Accu-Chek Guide test strip Generic drug: glucose blood USE 1 STRIP 4 TIMES DAILY AND AS NEEDED   BD Pen Needle Nano U/F 32G X 4 MM Misc Generic drug: Insulin Pen Needle USE ONE NEEDLE DAILY WITH INSULIN INJECTION   canagliflozin 100 MG Tabs tablet Commonly known as: INVOKANA Take 1 tablet (100 mg total) by mouth daily before breakfast. Started by: Scarlette Shorts, MD   ferrous sulfate 325 (65 FE) MG tablet Take 1 tablet (325 mg total) by mouth daily.   Lantus SoloStar 100 UNIT/ML Solostar Pen Generic drug: insulin glargine Inject 20 Units into the skin daily. What changed: how much to take Changed by: Scarlette Shorts, MD   metFORMIN 1000 MG tablet Commonly known as: GLUCOPHAGE Take 1  tablet (1,000 mg total) by mouth 2 (two) times daily with a meal.        OBJECTIVE:   Vital Signs: BP (!) 142/82 (BP Location: Left Arm, Patient Position: Sitting, Cuff Size: Large)   Pulse 97   Temp 98.7 F (37.1 C)   Ht 5\' 3"  (1.6 m)   Wt 244 lb 3.2 oz (110.8 kg)   SpO2 99%   BMI 43.26 kg/m   Wt Readings from Last 3 Encounters:  06/22/19 244 lb 3.2 oz (110.8 kg)  11/21/18 234 lb (106.1 kg)  09/07/18 237 lb 6.4 oz (107.7 kg)     Exam: General: Pt appears well and is in NAD  Lungs: Clear with good BS bilat with no rales, rhonchi, or wheezes  Heart: RRR with normal S1 and S2 and no gallops; no murmurs; no rub  Abdomen: Normoactive bowel sounds, soft, nontender, without masses or organomegaly palpable  Extremities: No pretibial edema  Skin: Normal texture and temperature to palpation.   Neuro: MS is good with appropriate affect, pt is alert and Ox3    DM foot exam: 06/22/2019    The skin of the feet is intact without sores or ulcerations. The pedal pulses are 2+ on right and 2+ on left. The sensation is intact to a screening 5.07, 10 gram monofilament bilaterally          DATA REVIEWED:  Lab Results  Component Value Date   HGBA1C 9.1 (A) 06/22/2019   HGBA1C 10.3 (H) 08/09/2018   HGBA1C 10.6 (H) 07/17/2018   Lab Results  Component Value Date   MICROALBUR 31.3 06/22/2019   LDLCALC 143 (H) 06/22/2019   CREATININE 1.06 06/22/2019    ASSESSMENT / PLAN / RECOMMENDATIONS:   1) Type 2 Diabetes Mellitus, poorly controlled, With microalbuminuria complications - Most recent A1c of 9.1%. Goal A1c < 7.0 %.    - Poorly controlled diabetes due to sub-optimal medical management, we discussed the importance of keeping up with future appointments. We discussed the importance of lifestyle changes as well.  - She is motivated to improve her health  - She is intolerant to higher doses of metformin, we discussed add-on therapy with SGLT-2 inhibitors, we discussed benefits as  well as risk of genital infections.     MEDICATIONS:  - Start Invokana 100 mg daily  - Continue Metformin 1000 mg, Half a tablet twice a day - Increase Lantus to 20 units daily    EDUCATION / INSTRUCTIONS:  BG monitoring instructions: Patient is instructed to check her blood sugars 1 times a day, fasting   Call [place] [facility] if: BG persistently < 70 or >  300. . I reviewed the Rule of 15 for the treatment of hypoglycemia in detail with the patient. Literature supplied.     2) Diabetic complications:   Eye: Does not have known diabetic retinopathy.   Neuro/ Feet: Does not have known diabetic peripheral neuropathy.  Renal: Patient does not have known baseline CKD. But has microalbuminuria, will recheck  On next visit and see the trend, will consider ACEI/ARB if persistent.   F/U in 3 months    Signed electronically by: Mack Guise, MD  [facility]  [facility] [address] [place], [place] [address] Phone: [phone] FAX: [phone]   CC: [address], [place] [address] Phone: [phone]  Fax: [phone]  Return to Endocrinology clinic as below: Future Appointments  Date Time Provider [place]  06/28/2019 11:10 AM Laury Deep, CNM [facility] None  09/28/2019  3:20 PM Kyri Dai, Melanie Crazier, MD [facility] None

## 2019-06-23 LAB — BASIC METABOLIC PANEL
BUN: 17 mg/dL (ref 7–25)
CO2: 24 mmol/L (ref 20–32)
Calcium: 9.5 mg/dL (ref 8.6–10.2)
Chloride: 104 mmol/L (ref 98–110)
Creat: 1.06 mg/dL (ref 0.50–1.10)
Glucose, Bld: 135 mg/dL — ABNORMAL HIGH (ref 65–99)
Potassium: 4.8 mmol/L (ref 3.5–5.3)
Sodium: 137 mmol/L (ref 135–146)

## 2019-06-23 LAB — LIPID PANEL
Cholesterol: 217 mg/dL — ABNORMAL HIGH (ref <200)
HDL: 60 mg/dL (ref >=50)
LDL Cholesterol (Calc): 143 mg/dL (calc) — ABNORMAL HIGH
Non-HDL Cholesterol (Calc): 157 mg/dL (calc) — ABNORMAL HIGH (ref <130)
Total CHOL/HDL Ratio: 3.6 (calc) (ref <5.0)
Triglycerides: 58 mg/dL (ref <150)

## 2019-06-23 LAB — MICROALBUMIN / CREATININE URINE RATIO
Creatinine, Urine: 134 mg/dL (ref 20–275)
Microalb Creat Ratio: 234 mcg/mg creat — ABNORMAL HIGH (ref <30)
Microalb, Ur: 31.3 mg/dL

## 2019-06-26 DIAGNOSIS — E1165 Type 2 diabetes mellitus with hyperglycemia: Secondary | ICD-10-CM | POA: Insufficient documentation

## 2019-06-26 DIAGNOSIS — E1129 Type 2 diabetes mellitus with other diabetic kidney complication: Secondary | ICD-10-CM | POA: Insufficient documentation

## 2019-06-26 DIAGNOSIS — Z794 Long term (current) use of insulin: Secondary | ICD-10-CM | POA: Insufficient documentation

## 2019-06-28 ENCOUNTER — Encounter: Payer: Self-pay | Admitting: Obstetrics and Gynecology

## 2019-06-28 ENCOUNTER — Telehealth: Payer: Self-pay | Admitting: General Practice

## 2019-06-28 ENCOUNTER — Ambulatory Visit (INDEPENDENT_AMBULATORY_CARE_PROVIDER_SITE_OTHER): Payer: 59 | Admitting: Obstetrics and Gynecology

## 2019-06-28 ENCOUNTER — Other Ambulatory Visit: Payer: Self-pay

## 2019-06-28 VITALS — BP 126/85 | HR 99 | Temp 97.8°F | Ht 64.0 in | Wt 247.8 lb

## 2019-06-28 DIAGNOSIS — N96 Recurrent pregnancy loss: Secondary | ICD-10-CM

## 2019-06-28 DIAGNOSIS — N926 Irregular menstruation, unspecified: Secondary | ICD-10-CM

## 2019-06-28 DIAGNOSIS — R809 Proteinuria, unspecified: Secondary | ICD-10-CM | POA: Diagnosis not present

## 2019-06-28 DIAGNOSIS — Q513 Bicornate uterus: Secondary | ICD-10-CM

## 2019-06-28 DIAGNOSIS — Z794 Long term (current) use of insulin: Secondary | ICD-10-CM

## 2019-06-28 DIAGNOSIS — E1129 Type 2 diabetes mellitus with other diabetic kidney complication: Secondary | ICD-10-CM

## 2019-06-28 NOTE — Telephone Encounter (Signed)
Left message on voicemail in regard to Ultrasound scheduled on 07/09/2019 at 8:00am.  Asked patient to give our office with questions or concerns.

## 2019-06-28 NOTE — Progress Notes (Signed)
  GYNECOLOGY PROGRESS NOTE  History:  [name] is a 29 y.o. G4P0040 presents to [facility] office today for problem gyn visit. She reports dysmenorrhea, moderately heavy VB and clots with menses. She reports her menstrual cycle last 7-9 days. Her LMP was 06/15/19 was "more normal." She denies h/a, dizziness, shortness of breath, n/v, or fever/chills.    The following portions of the patient's history were reviewed and updated as appropriate: allergies, current medications, past family history, past medical history, past social history, past surgical history and problem list. Last pap smear on 04/11/2017 was normal.  Review of Systems:  Pertinent items are noted in HPI.   Objective:  Physical Exam Blood pressure 126/85, pulse 99, temperature 97.8 F (36.6 C), temperature source Oral, height 5\' 4"  (1.626 m), weight 247 lb 12.8 oz (112.4 kg), last menstrual period 06/15/2019. VS reviewed, nursing note reviewed,  Constitutional: well developed, well nourished, no distress HEENT: normocephalic CV: normal rate Pulm/chest wall: normal effort Breast Exam: deferred Abdomen: soft Neuro: alert and oriented x 3 Skin: warm, dry Psych: affect normal Pelvic exam: deferred  Assessment & Plan:  1. History of recurrent miscarriages - Lupus anticoagulant panel( [facility]) - Thyroid Panel With TSH - CBC w/Diff - Consult with Dr. 06/17/2019 - notified of patient's complaints, assessments, lab & U/S results, tx plan lupus panel, thyroid panel, better control of DM -  agrees with plan, recommends IUD until DM under control, needs pelvic U/S, Rx Megace, if having heavy vaginal bleeding  2. Menstrual problem - Prolactin - CBC w/Diff  3. Bicornuate uterus - Needs pelvic U/S  4. Type 2 diabetes mellitus with microalbuminuria, with long-term current use of insulin (HCC) - Not well-controlled - Advised to consistently follow DM regimen of medications and diet -  Advised best to get diabetes under control before pregnancy d/t the increased risk of SAB  100% of 20 minute encounter spent coordination of care and follow-up  Macon Large, CNM 06/28/2019

## 2019-06-30 LAB — CBC WITH DIFFERENTIAL/PLATELET
Basophils Absolute: 0.1 10*3/uL (ref 0.0–0.2)
Basos: 1 %
EOS (ABSOLUTE): 0.2 10*3/uL (ref 0.0–0.4)
Eos: 2 %
Hematocrit: 37.5 % (ref 34.0–46.6)
Hemoglobin: 11.6 g/dL (ref 11.1–15.9)
Immature Grans (Abs): 0.1 10*3/uL (ref 0.0–0.1)
Immature Granulocytes: 1 %
Lymphocytes Absolute: 3.6 10*3/uL — ABNORMAL HIGH (ref 0.7–3.1)
Lymphs: 33 %
MCH: 24.2 pg — ABNORMAL LOW (ref 26.6–33.0)
MCHC: 30.9 g/dL — ABNORMAL LOW (ref 31.5–35.7)
MCV: 78 fL — ABNORMAL LOW (ref 79–97)
Monocytes Absolute: 0.6 10*3/uL (ref 0.1–0.9)
Monocytes: 5 %
Neutrophils Absolute: 6.5 10*3/uL (ref 1.4–7.0)
Neutrophils: 58 %
Platelets: 421 10*3/uL (ref 150–450)
RBC: 4.8 x10E6/uL (ref 3.77–5.28)
RDW: 16.6 % — ABNORMAL HIGH (ref 11.7–15.4)
WBC: 11.1 10*3/uL — ABNORMAL HIGH (ref 3.4–10.8)

## 2019-06-30 LAB — THYROID PANEL WITH TSH
Free Thyroxine Index: 1.9 (ref 1.2–4.9)
T3 Uptake Ratio: 30 % (ref 24–39)
T4, Total: 6.2 ug/dL (ref 4.5–12.0)
TSH: 1.62 u[IU]/mL (ref 0.450–4.500)

## 2019-06-30 LAB — PROLACTIN: Prolactin: 22.6 ng/mL (ref 4.8–23.3)

## 2019-06-30 LAB — LUPUS ANTICOAGULANT PANEL
Dilute Viper Venom Time: 31.6 s (ref 0.0–47.0)
PTT Lupus Anticoagulant: 28.1 s (ref 0.0–51.9)

## 2019-07-05 ENCOUNTER — Ambulatory Visit
Admission: RE | Admit: 2019-07-05 | Discharge: 2019-07-05 | Disposition: A | Discharge status: Home / Self Care | Payer: 59 | Source: Ambulatory Visit | Attending: Obstetrics and Gynecology | Admitting: Obstetrics and Gynecology

## 2019-07-05 ENCOUNTER — Other Ambulatory Visit: Payer: Self-pay

## 2019-07-05 DIAGNOSIS — N926 Irregular menstruation, unspecified: Secondary | ICD-10-CM | POA: Insufficient documentation

## 2019-07-05 DIAGNOSIS — N96 Recurrent pregnancy loss: Secondary | ICD-10-CM

## 2019-07-09 ENCOUNTER — Other Ambulatory Visit: Payer: Self-pay

## 2019-07-09 ENCOUNTER — Encounter: Payer: Self-pay | Admitting: Obstetrics & Gynecology

## 2019-07-09 ENCOUNTER — Other Ambulatory Visit: Payer: 59

## 2019-07-09 ENCOUNTER — Ambulatory Visit: Payer: 59 | Admitting: Obstetrics & Gynecology

## 2019-07-09 VITALS — BP 100/69 | HR 123 | Ht 64.0 in | Wt 245.0 lb

## 2019-07-09 DIAGNOSIS — Z3202 Encounter for pregnancy test, result negative: Secondary | ICD-10-CM | POA: Diagnosis not present

## 2019-07-09 DIAGNOSIS — N939 Abnormal uterine and vaginal bleeding, unspecified: Secondary | ICD-10-CM

## 2019-07-09 DIAGNOSIS — Z30017 Encounter for initial prescription of implantable subdermal contraceptive: Secondary | ICD-10-CM

## 2019-07-09 HISTORY — DX: Abnormal uterine and vaginal bleeding, unspecified: N93.9

## 2019-07-09 LAB — POCT URINE PREGNANCY: Preg Test, Ur: NEGATIVE

## 2019-07-09 MED ORDER — ETONOGESTREL 68 MG ~~LOC~~ IMPL
68.0000 mg | DRUG_IMPLANT | Freq: Once | SUBCUTANEOUS | Status: AC
  Administered 2019-07-09: 68 mg via SUBCUTANEOUS

## 2019-07-09 NOTE — Patient Instructions (Signed)
Nexplanon Instructions After Insertion  Keep bandage clean and dry for 24 hours  May use ice/Tylenol/Ibuprofen for soreness or pain  If you develop fever, drainage or increased warmth from incision site-contact office immediately   

## 2019-07-09 NOTE — Progress Notes (Signed)
     GYNECOLOGY OFFICE PROCEDURE NOTE  Nina Phillips is a 29 y.o. G4P0040 here for Nexplanon insertion for AUB management.  Declines IUD insertion.  Sister has Nexplanon and tolerates it well,   Last pap smear was on 04/11/2017 and was normal.  No other gynecologic concerns.  Nexplanon Insertion Procedure Patient identified, informed consent performed, consent signed.   Patient does understand that irregular bleeding is a very common side effect of this medication, can last up to 3-6 months. She was informed of decreased contraceptive efficacy given her BMI, progestin IUD recommended but she declined this. She was advised to have backup contraception for one week after placement. Pregnancy test in clinic today was negative.  Appropriate time out taken.  Patient's left arm was prepped and draped in the usual sterile fashion. The ruler used to measure and mark insertion area.  Patient was prepped with alcohol swab and then injected with 3 ml of 1% lidocaine.  She was prepped with betadine, Nexplanon removed from packaging,  Device confirmed in needle, then inserted full length of needle and withdrawn per handbook instructions. Nexplanon was able to palpated in the patient's arm; patient palpated the insert herself. There was minimal blood loss.  Patient insertion site covered with guaze and a pressure bandage to reduce any bruising.  The patient tolerated the procedure well and was given post procedure instructions.   For workup of her AUB, she had an inadequate scan.  Repeat ultrasound ordered, will follow up results and manage accordingly.  Will follow up with me in one month.   Jaynie Collins, MD, FACOG Obstetrician & Gynecologist, [facility] for [employer], [facility]

## 2019-07-11 ENCOUNTER — Telehealth: Payer: Self-pay | Admitting: Internal Medicine

## 2019-07-11 MED ORDER — FLUCONAZOLE 150 MG PO TABS
150.0000 mg | ORAL_TABLET | Freq: Once | ORAL | 0 refills | Status: AC
Start: 2019-07-11 — End: 2019-07-11

## 2019-07-11 NOTE — Telephone Encounter (Signed)
Please advise 

## 2019-07-11 NOTE — Telephone Encounter (Signed)
Pt stated that this is her 1st one and that she does not wish to stop the medication at this time.

## 2019-07-11 NOTE — Telephone Encounter (Signed)
Patient has called to advise that the Invokana has caused a yeast infection.  Patient called to see if something could be called in for her.  Call back number is [phone]

## 2019-07-13 NOTE — Telephone Encounter (Signed)
Patient called stating she needs the medication for her yeast infection called in.  [facility] - [place], [place] - [address] Phone:  [phone]  Fax:  [phone]

## 2019-07-14 IMAGING — US US OB < 14 WEEKS - US OB TV
1 series · 15 of 28 positions shown · non-contrast
Comparison: None.

CLINICAL DATA: Vaginal bleeding in pregnancy. Gestational age by
LMP of 7 weeks 3 days.

EXAM:
OBSTETRIC <14 WK US AND TRANSVAGINAL OB US
TECHNIQUE: Both transabdominal and transvaginal ultrasound examinations were
performed for complete evaluation of the gestation as well as the
maternal uterus, adnexal regions, and pelvic cul-de-sac.
Transvaginal technique was performed to assess early pregnancy.

[Series 1: us ob < 14 weeks - us ob tv · 15 of 81 slices shown]
[im 1/81]
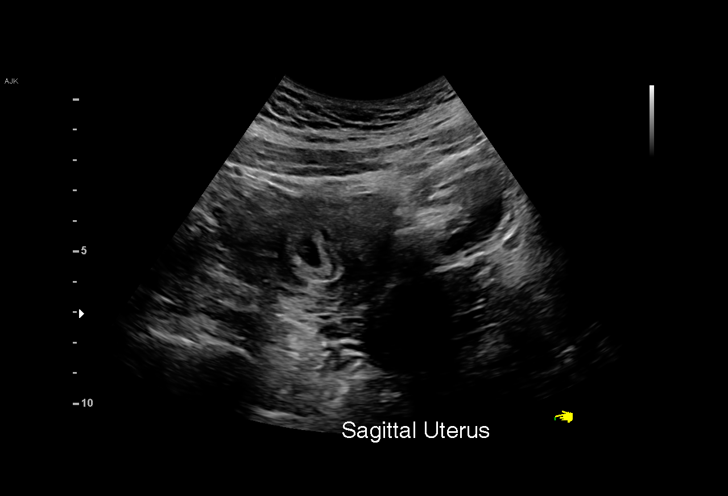
[im 6/81]
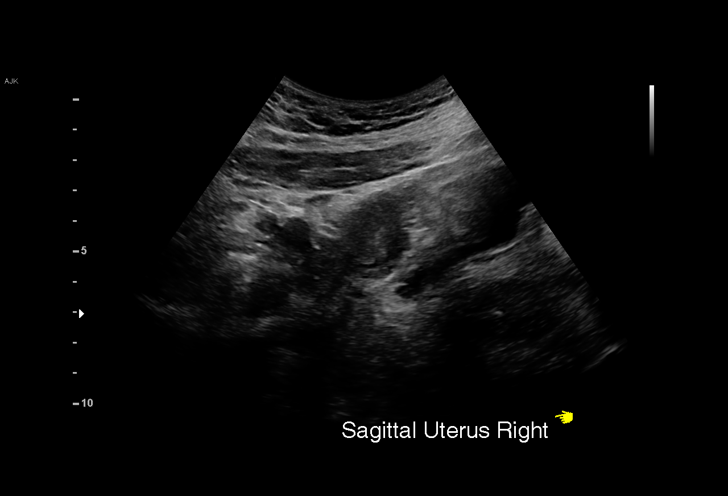
[im 12/81]
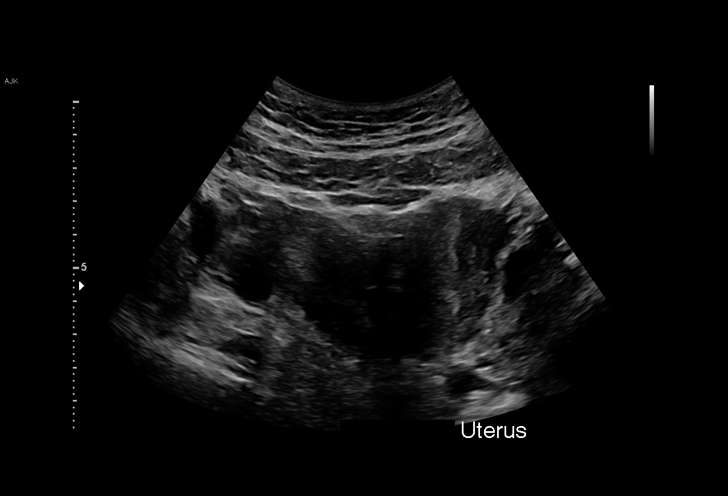
[im 18/81]
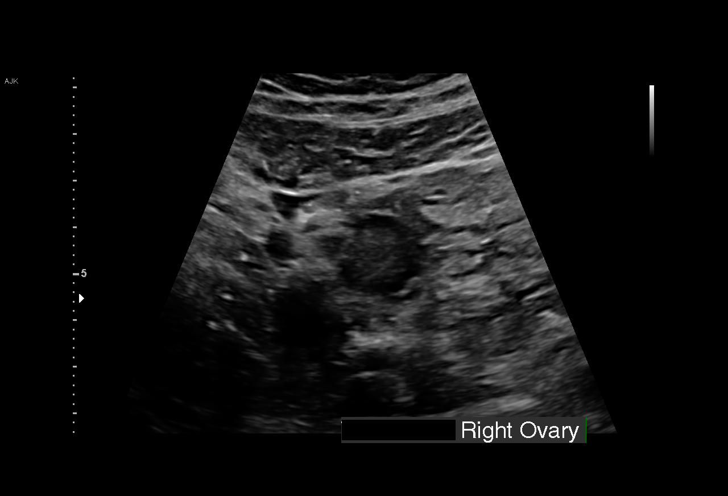
[im 24/81]
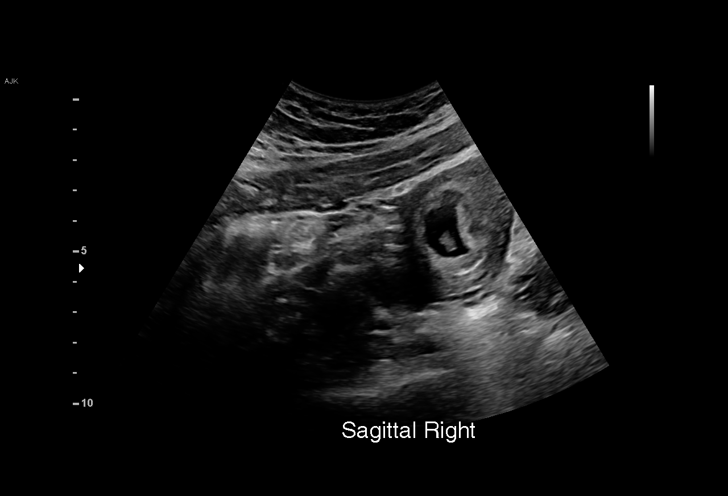
[im 30/81]
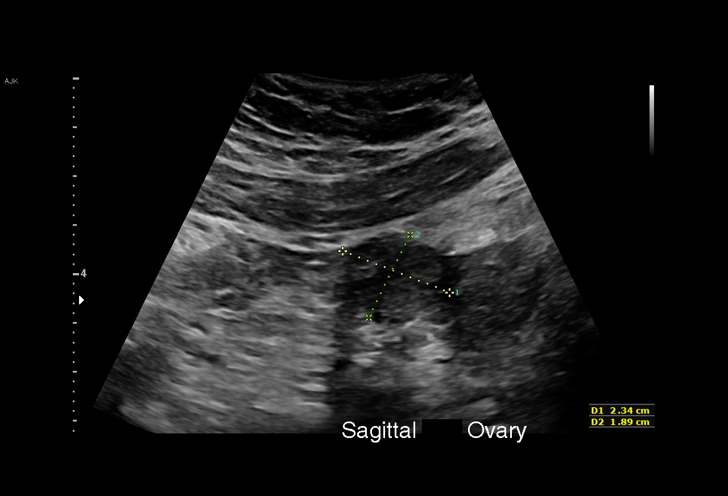
[im 36/81]
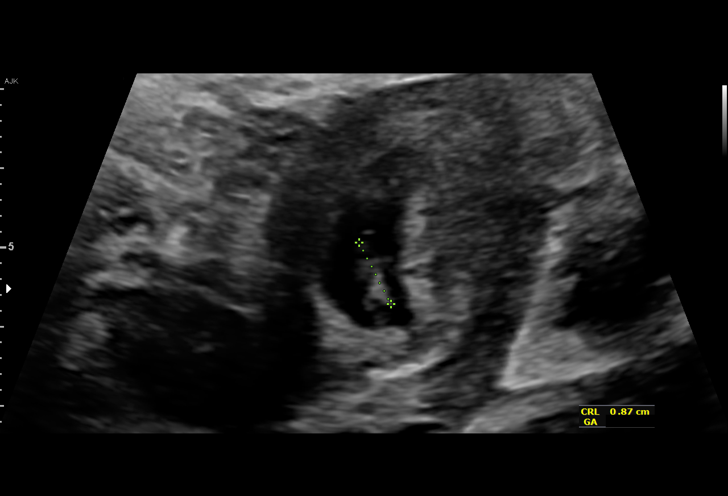
[im 42/81]
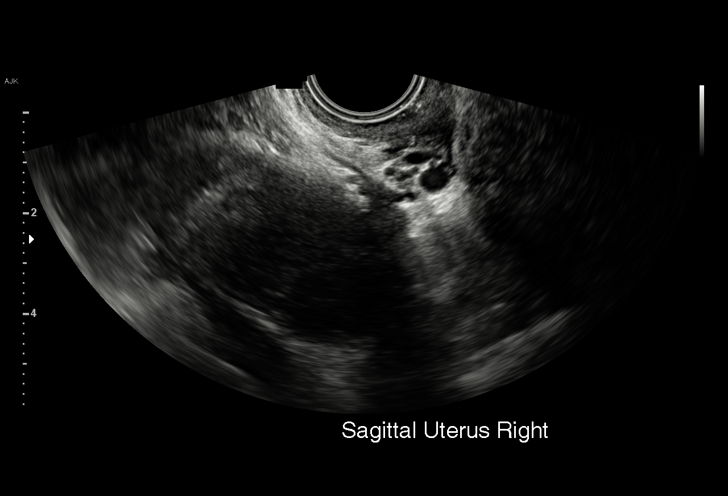
[im 45/81]
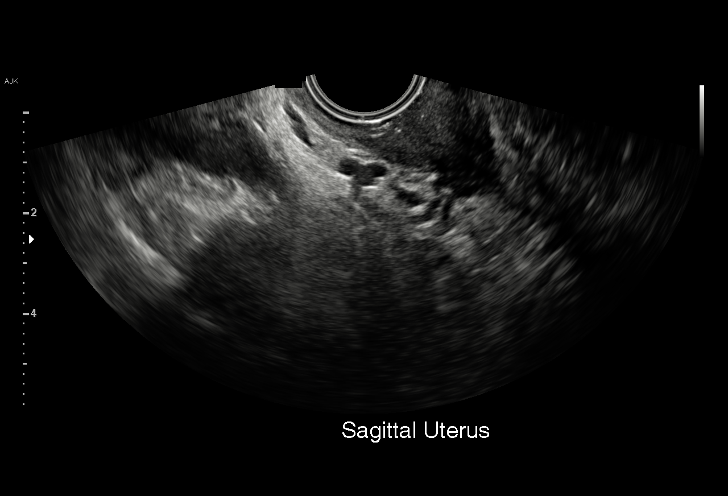
[im 51/81]
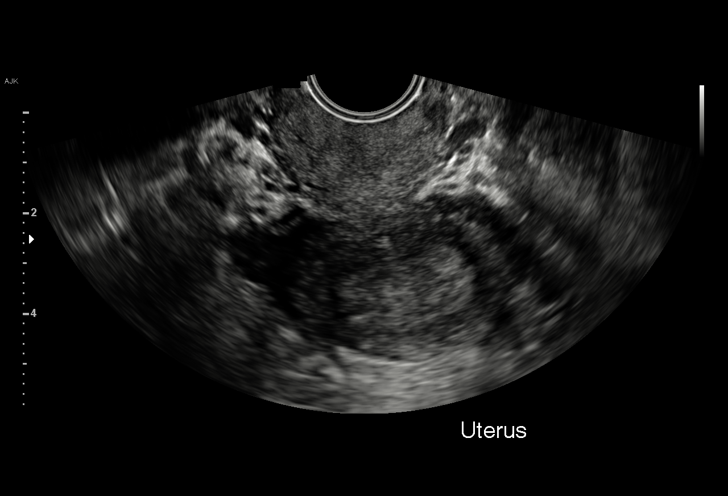
[im 57/81]
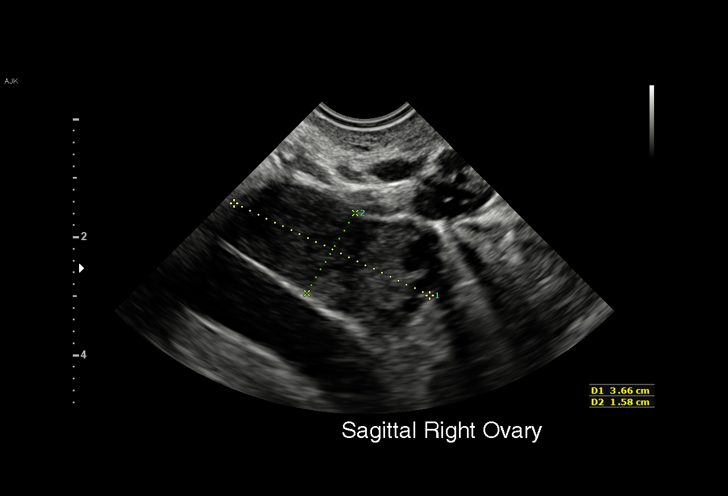
[im 63/81]
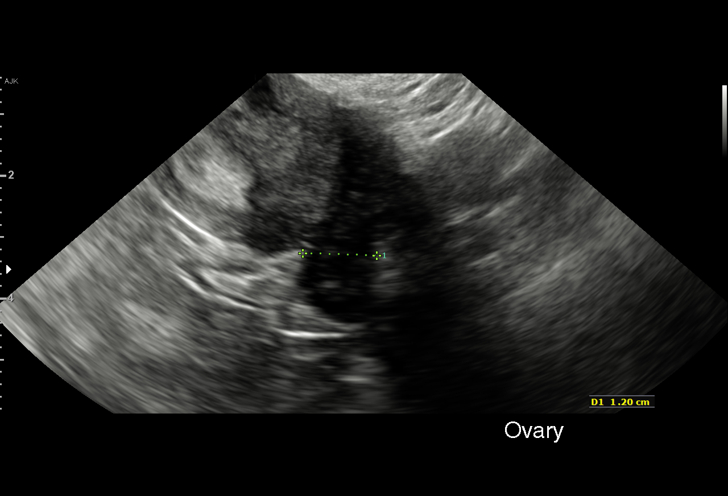
[im 69/81]
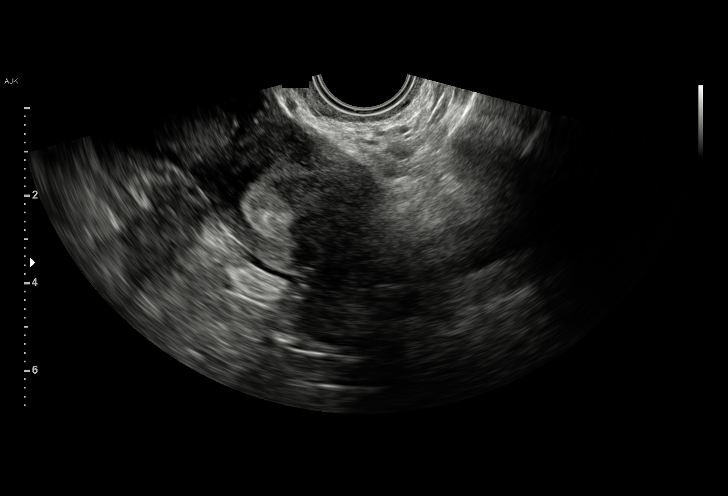
[im 75/81]
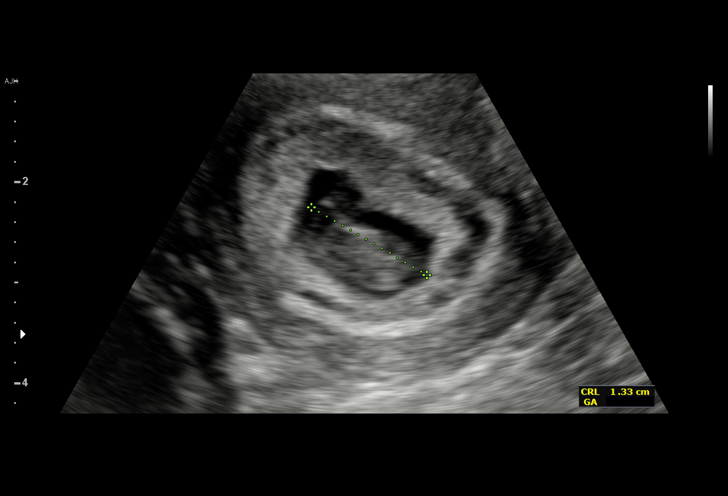
[im 81/81]
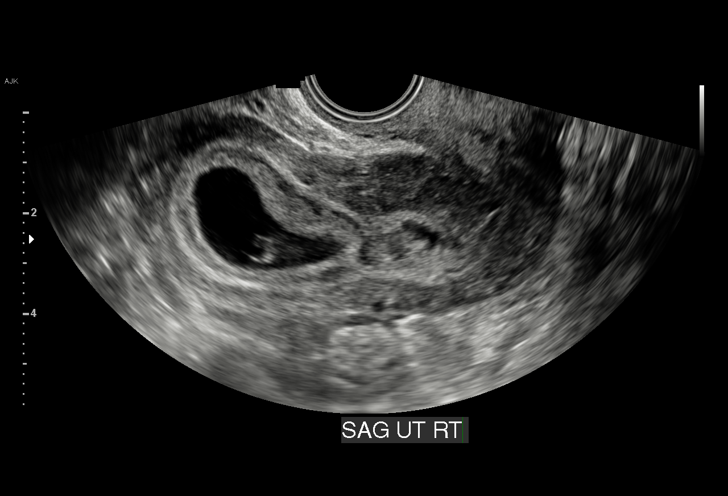

[15 of 28 positions shown; findings below may reference images not displayed]

FINDINGS: Intrauterine gestational sac: Single

Yolk sac:  Visualized.

Embryo:  Visualized.

Cardiac Activity: Visualized.

Heart Rate: 157 bpm

CRL:  13 mm   7 w   3 d                  US EDC: 11/04/2017

Subchorionic hemorrhage:  None visualized.

Maternal uterus/adnexae: Uterine myometrial septum is seen, with the
IUP in the right uterine horn. There appears to be fundal myometrial
cleft, which may be seen with bicornuate uterus or uterine
didelphys.

Both ovaries are normal appearance. No evidence of adnexal mass or
fluid.
IMPRESSION: Single living IUP measuring 7 weeks 3 days, which is concordant with
LMP.

Mullerian duct anomaly involving the uterus, suspicious for
bicornuate uterus or uterine didelphys. Consider pelvic MRI without
contrast for further evaluation.

Normal appearance of both ovaries.  No adnexal mass identified.

## 2019-07-16 ENCOUNTER — Other Ambulatory Visit: Payer: Self-pay | Admitting: Internal Medicine

## 2019-07-16 MED ORDER — FLUCONAZOLE 150 MG PO TABS: 150.0000 mg | ORAL_TABLET | Freq: Every day | ORAL | 0 refills | Status: DC

## 2019-07-16 NOTE — Telephone Encounter (Signed)
Please advise 

## 2019-07-16 NOTE — Telephone Encounter (Signed)
I reviewed chart before I sent the message and I did not see that this was called in. I contacted the patient to make sure she did not get the Rx and she confirmed that it was not called in to her pharmacy and she still needs it.-please advise

## 2019-07-16 NOTE — Telephone Encounter (Signed)
Spoke with the pharmacy and she did pick up the Rx so I contacted the patient and she is stating she needs a second dose because the yeast infection has not gone away-please advise

## 2019-07-17 ENCOUNTER — Other Ambulatory Visit: Payer: Self-pay

## 2019-07-17 ENCOUNTER — Ambulatory Visit
Admission: RE | Admit: 2019-07-17 | Discharge: 2019-07-17 | Disposition: A | Discharge status: Home / Self Care | Payer: 59 | Source: Ambulatory Visit | Attending: Obstetrics & Gynecology | Admitting: Obstetrics & Gynecology

## 2019-07-17 DIAGNOSIS — N939 Abnormal uterine and vaginal bleeding, unspecified: Secondary | ICD-10-CM | POA: Diagnosis not present

## 2019-07-18 ENCOUNTER — Telehealth: Payer: Self-pay | Admitting: Internal Medicine

## 2019-07-18 NOTE — Telephone Encounter (Signed)
Please advise 

## 2019-07-18 NOTE — Telephone Encounter (Signed)
Spoke with the patient today and gave her MD advice-she is feeling better but has agreed to contact PCP if yeast infection comes back

## 2019-07-18 NOTE — Telephone Encounter (Signed)
Patient requests to be called at ph# [phone] re: Patient states she is pregnant and is not sue if she should be taking Invokana.

## 2019-07-18 NOTE — Telephone Encounter (Signed)
Pt scheduled for 08/13/19 @ 11:30 per Dr. Lonzo Cloud

## 2019-07-20 ENCOUNTER — Other Ambulatory Visit: Payer: Self-pay

## 2019-07-20 ENCOUNTER — Encounter: Payer: Self-pay | Admitting: Obstetrics

## 2019-07-20 ENCOUNTER — Ambulatory Visit (INDEPENDENT_AMBULATORY_CARE_PROVIDER_SITE_OTHER): Payer: 59 | Admitting: Obstetrics

## 2019-07-20 ENCOUNTER — Other Ambulatory Visit: Payer: Self-pay | Admitting: Obstetrics

## 2019-07-20 VITALS — BP 140/88 | HR 80 | Wt 246.0 lb

## 2019-07-20 DIAGNOSIS — Z3201 Encounter for pregnancy test, result positive: Secondary | ICD-10-CM | POA: Diagnosis not present

## 2019-07-20 DIAGNOSIS — Z3046 Encounter for surveillance of implantable subdermal contraceptive: Secondary | ICD-10-CM | POA: Diagnosis not present

## 2019-07-20 NOTE — Progress Notes (Signed)
NEXPLANON REMOVAL NOTE  Date of LMP:   unknown  Contraception used: *Nexplanon   Indications:  The patient desires removal of Nexplanon.  She understands risks, benefits, and alternatives to Implanon and would like to proceed.  Anesthesia:   Lidocaine 1% plain.  Procedure:  A time-out was performed confirming the procedure and the patient's allergy status.  Complications: None                      The rod was palpated and the area was sterilely prepped.  The area beneath the distal tip was anesthetized with 1% xylocaine and the skin incised                       Over the tip and the tip was exposed, grasped with forcep and removed intact.  A single suture of 4-0 Vicryl was used to close incision.  Steri strip                       And a bandage applied and the arm was wrapped with gauze bandage.  The patient tolerated well.  Instructions:  The patient was instructed to remove the dressing in 24 hours and that some bruising is to be expected.  She was advised to use over the counter analgesics as needed for any pain at the site.  She is to keep the area dry for 24 hours and to call if her hand or arm becomes cold, numb, or blue.  Return visit:  Return in 2 weeks   Brock Bad, MD 07/20/2019 11:29 AM

## 2019-07-20 NOTE — Progress Notes (Signed)
Nexplanon inserted 07/09/19  Home UPT test positive per pt  LMP 06/15/19 - EDD 03/21/20

## 2019-07-23 LAB — POCT URINE PREGNANCY: Preg Test, Ur: POSITIVE — AB

## 2019-07-24 ENCOUNTER — Encounter (HOSPITAL_COMMUNITY): Payer: Self-pay | Admitting: Obstetrics and Gynecology

## 2019-07-24 ENCOUNTER — Inpatient Hospital Stay (HOSPITAL_COMMUNITY): Payer: 59

## 2019-07-24 ENCOUNTER — Other Ambulatory Visit: Payer: Self-pay

## 2019-07-24 ENCOUNTER — Inpatient Hospital Stay (HOSPITAL_COMMUNITY)
Admission: AD | Admit: 2019-07-24 | Discharge: 2019-07-24 | Disposition: A | Discharge status: Home / Self Care | Payer: 59 | Attending: Obstetrics and Gynecology | Admitting: Obstetrics and Gynecology

## 2019-07-24 DIAGNOSIS — Z3A01 Less than 8 weeks gestation of pregnancy: Secondary | ICD-10-CM

## 2019-07-24 DIAGNOSIS — O209 Hemorrhage in early pregnancy, unspecified: Secondary | ICD-10-CM | POA: Diagnosis present

## 2019-07-24 DIAGNOSIS — O3401 Maternal care for unspecified congenital malformation of uterus, first trimester: Secondary | ICD-10-CM | POA: Insufficient documentation

## 2019-07-24 DIAGNOSIS — Z87891 Personal history of nicotine dependence: Secondary | ICD-10-CM | POA: Diagnosis not present

## 2019-07-24 DIAGNOSIS — Z881 Allergy status to other antibiotic agents status: Secondary | ICD-10-CM | POA: Insufficient documentation

## 2019-07-24 DIAGNOSIS — O468X1 Other antepartum hemorrhage, first trimester: Secondary | ICD-10-CM | POA: Diagnosis not present

## 2019-07-24 DIAGNOSIS — Z833 Family history of diabetes mellitus: Secondary | ICD-10-CM | POA: Insufficient documentation

## 2019-07-24 DIAGNOSIS — Q513 Bicornate uterus: Secondary | ICD-10-CM | POA: Diagnosis not present

## 2019-07-24 DIAGNOSIS — Z882 Allergy status to sulfonamides status: Secondary | ICD-10-CM | POA: Insufficient documentation

## 2019-07-24 DIAGNOSIS — Z3490 Encounter for supervision of normal pregnancy, unspecified, unspecified trimester: Secondary | ICD-10-CM

## 2019-07-24 DIAGNOSIS — O24111 Pre-existing diabetes mellitus, type 2, in pregnancy, first trimester: Secondary | ICD-10-CM | POA: Insufficient documentation

## 2019-07-24 DIAGNOSIS — O208 Other hemorrhage in early pregnancy: Secondary | ICD-10-CM | POA: Diagnosis not present

## 2019-07-24 LAB — URINALYSIS, ROUTINE W REFLEX MICROSCOPIC
Bilirubin Urine: NEGATIVE
Glucose, UA: NEGATIVE mg/dL
Ketones, ur: 5 mg/dL — AB
Leukocytes,Ua: NEGATIVE
Nitrite: NEGATIVE
Protein, ur: NEGATIVE mg/dL
Specific Gravity, Urine: 1.003 — ABNORMAL LOW (ref 1.005–1.030)
pH: 6 (ref 5.0–8.0)

## 2019-07-24 LAB — CBC
HCT: 35.6 % — ABNORMAL LOW (ref 36.0–46.0)
Hemoglobin: 11 g/dL — ABNORMAL LOW (ref 12.0–15.0)
MCH: 23.6 pg — ABNORMAL LOW (ref 26.0–34.0)
MCHC: 30.9 g/dL (ref 30.0–36.0)
MCV: 76.2 fL — ABNORMAL LOW (ref 80.0–100.0)
Platelets: 425 10*3/uL — ABNORMAL HIGH (ref 150–400)
RBC: 4.67 MIL/uL (ref 3.87–5.11)
RDW: 16.8 % — ABNORMAL HIGH (ref 11.5–15.5)
WBC: 13.5 10*3/uL — ABNORMAL HIGH (ref 4.0–10.5)
nRBC: 0 % (ref 0.0–0.2)

## 2019-07-24 LAB — POCT PREGNANCY, URINE: Preg Test, Ur: POSITIVE — AB

## 2019-07-24 LAB — HCG, QUANTITATIVE, PREGNANCY: hCG, Beta Chain, Quant, S: 7961 m[IU]/mL — ABNORMAL HIGH (ref <5)

## 2019-07-24 NOTE — MAU Provider Note (Signed)
History     CSN: [id number]  Arrival date and time: 07/24/19 1713   First Provider Initiated Contact with Patient 07/24/19 1910      Chief Complaint  Patient presents with  . Vaginal Bleeding   HPI Nina Phillips is a 29 y.o. [id number] at [redacted]w[redacted]d who presents to MAU with chief complaint of vaginal spotting in first trimester. This is a new problem, onset around 1615 today. Patient endorses seeing brown discharge each time she wiped after voiding. She denies abdominal pain, dysuria, fever or recent illness. She is remote from intercourse.  Patient is taking Heparin 10,000 units BID as advised during her fertility consult at [facility].  Patient endorses multiple encounters at [facility] including pregnancy confirmation yesterday. She requests transfer of care to [facility].  OB History    Gravida  5   Para      Term      Preterm      AB  4   Living  0     SAB  4   TAB      Ectopic      Multiple  0   Live Births              Past Medical History:  Diagnosis Date  . Anemia   . Anxiety   . Bicornate uterus   . Depression    seeing a therapist since 3rd miscarriage, helping, doing good.  . Diabetes mellitus without complication (HCC)    Type 2  . Gout   . History of miscarriage   . Ovarian cyst     Past Surgical History:  Procedure Laterality Date  . DILATION AND CURETTAGE OF UTERUS    . WISDOM TOOTH EXTRACTION      Family History  Problem Relation Age of Onset  . Cancer Other   . Hypertension Other   . Diabetes Other   . Hypertension Mother   . Gout Mother   . Diabetes Father   . Hypertension Father   . Stroke Father   . Hypertension Maternal Grandfather   . Heart attack Maternal Grandfather   . Stroke Paternal Grandmother   . Hypertension Maternal Aunt   . Cancer Maternal Grandmother        breast  . Diabetes Paternal Grandfather     Social History   Tobacco Use  . Smoking status: Former Games developer  . Smokeless  tobacco: Never Used  . Tobacco comment: quit 2014  Vaping Use  . Vaping Use: Never used  Substance Use Topics  . Alcohol use: No    Comment: occas  . Drug use: No    Allergies:  Allergies  Allergen Reactions  . Apple Fruit Extract Hives  . Banana Hives  . Other Hives    Walnuts   . Shellfish Allergy Hives  . Tomato Hives  . Watermelon [Citrullus Vulgaris] Hives  . Bactrim [Sulfamethoxazole-Trimethoprim] Hives and Rash  . Sulfa Antibiotics Hives and Rash    No medications prior to admission.    Review of Systems  Gastrointestinal: Negative for abdominal pain.  Genitourinary: Positive for vaginal bleeding.  Musculoskeletal: Negative for back pain.  All other systems reviewed and are negative.  Physical Exam   Blood pressure 135/66, pulse 100, resp. rate 17, last menstrual period 06/15/2019, unknown if currently breastfeeding.  Physical Exam Vitals and nursing note reviewed. Exam conducted with a chaperone present.  Constitutional:      General: She is not in acute distress.  Appearance: Normal appearance. She is not ill-appearing.  HENT:     Mouth/Throat:     Mouth: Mucous membranes are moist.  Cardiovascular:     Rate and Rhythm: Normal rate.  Pulmonary:     Effort: Pulmonary effort is normal.     Breath sounds: Normal breath sounds.  Abdominal:     General: Abdomen is flat.     Tenderness: There is no abdominal tenderness.  Genitourinary:    General: Normal vulva.     Comments: Pelvic exam: External genitalia normal, vaginal walls pink and well rugated, cervix visually closed, no lesions noted. Scant blood-tinged mucus protruding from cervical os.  Skin:    General: Skin is warm and dry.     Capillary Refill: Capillary refill takes less than 2 seconds.  Neurological:     Mental Status: She is alert.  Psychiatric:        Mood and Affect: Mood normal.        Behavior: Behavior normal.        Thought Content: Thought content normal.        Judgment:  Judgment normal.     MAU Course/MDM  Procedures --S/p evaluation for hx of AUB --S/p Nexplanon removal 06/25, positive UPT in clinic 06/28 --S/p fertility consult at [facility]. Patient states she was advised to return there once she had a positive HPT but she "hasn't paid the bill" and cannot secure an appointment. Desires f/u with [facility]  Patient Vitals for the past 24 hrs:  BP Pulse Resp  07/24/19 1929 135/66 100 17  07/24/19 1748 118/73 93 --   Results for orders placed or performed during the hospital encounter of 07/24/19 (from the past 24 hour(s))  Urinalysis, Routine w reflex microscopic     Status: Abnormal   Collection Time: 07/24/19  5:30 PM  Result Value Ref Range   Color, Urine STRAW (A) YELLOW   APPearance CLEAR CLEAR   Specific Gravity, Urine 1.003 (L) 1.005 - 1.030   pH 6.0 5.0 - 8.0   Glucose, UA NEGATIVE NEGATIVE mg/dL   Hgb urine dipstick MODERATE (A) NEGATIVE   Bilirubin Urine NEGATIVE NEGATIVE   Ketones, ur 5 (A) NEGATIVE mg/dL   Protein, ur NEGATIVE NEGATIVE mg/dL   Nitrite NEGATIVE NEGATIVE   Leukocytes,Ua NEGATIVE NEGATIVE   WBC, UA 0-5 0 - 5 WBC/hpf   Bacteria, UA RARE (A) NONE SEEN   Squamous Epithelial / LPF 0-5 0 - 5  Pregnancy, urine POC     Status: Abnormal   Collection Time: 07/24/19  5:31 PM  Result Value Ref Range   Preg Test, Ur POSITIVE (A) NEGATIVE  hCG, quantitative, pregnancy     Status: Abnormal   Collection Time: 07/24/19  5:52 PM  Result Value Ref Range   hCG, Beta Chain, Quant, S 7,961 (H) <5 mIU/mL  CBC     Status: Abnormal   Collection Time: 07/24/19  5:52 PM  Result Value Ref Range   WBC 13.5 (H) 4.0 - 10.5 K/uL   RBC 4.67 3.87 - 5.11 MIL/uL   Hemoglobin 11.0 (L) 12.0 - 15.0 g/dL   HCT 36.1 (L) 36 - 46 %   MCV 76.2 (L) 80.0 - 100.0 fL   MCH 23.6 (L) 26.0 - 34.0 pg   MCHC 30.9 30.0 - 36.0 g/dL   RDW 44.3 (H) 15.4 - 00.8 %   Platelets 425 (H) 150 - 400 K/uL   nRBC 0.0 0.0 - 0.2 %   US  OB LESS  THAN 14 WEEKS WITH OB TRANSVAGINAL  Result Date: 07/24/2019 CLINICAL DATA:  Initial evaluation for acute vaginal bleeding, early pregnancy. EXAM: OBSTETRIC <14 WK [name] AND TRANSVAGINAL OB US TECHNIQUE: Both transabdominal and transvaginal ultrasound examinations were performed for complete evaluation of the gestation as well as the maternal uterus, adnexal regions, and pelvic cul-de-sac. Transvaginal technique was performed to assess early pregnancy. COMPARISON:  Recent ultrasound from 07/17/2019. FINDINGS: Intrauterine gestational sac: Single. Underlying bicornuate uterus, with sac positioned within the right uterine horn. Yolk sac:  Present Embryo:  Negative. Cardiac Activity: Negative. MSD: 9.87 mm   5 w   5 d Subchorionic hemorrhage: Small subchorionic hemorrhage without associated mass effect. Maternal uterus/adnexae: Right ovary within normal limits. Left ovary not visualized. No adnexal mass. Trace free fluid noted within the pelvis. IMPRESSION: 1. Early intrauterine gestational sac with internal yolk sac, but no fetal pole or cardiac activity yet visualized. Underlying bicornuate uterus with the gestational sac positioned within the right uterine horn. Recommend follow-up quantitative B-HCG levels and follow-up US in 14 days to confirm and assess viability. 2. Small subchorionic hemorrhage without associated mass effect. 3. No other acute maternal uterine or adnexal abnormality. Electronically Signed   By: Rise Mu M.D.   On: 07/24/2019 19:07    Assessment and Plan  --29 y.o. [id number] with SIUP at [redacted]w[redacted]d by LMP --Subchorionic Hematoma, pelvic rest advised --Blood type O POS --Discharge home in stable condition  F/U: --Desires transfer from [facility] to [facility]. Message sent to clinic manager --Order placed for viability scan in 14 days due to patient's history of early loss   Calvert Cantor, [place] 07/24/2019, 7:46 PM

## 2019-07-24 NOTE — Discharge Instructions (Signed)

## 2019-07-24 NOTE — MAU Note (Signed)
.  Nina Phillips is a 29 y.o. at [redacted]w[redacted]d here in MAU reporting: she is pregnant and had a nexplanon placed on June 14 and found out she was pregnant on June the 23rd and called to have nexplanon taken out. PT had taken out on June the 25th. Reports small amount of brown discharge. Denies any pain LMP: 06/15/19 Onset of complaint:ongoing  Pain score: 0  BP:118/73 P: 77 R: 18 Temp 98.3 FHT: Lab orders placed from triage: UA/UPT

## 2019-07-26 ENCOUNTER — Telehealth: Payer: 59 | Admitting: Obstetrics and Gynecology

## 2019-07-26 ENCOUNTER — Telehealth: Payer: Self-pay | Admitting: Advanced Practice Midwife

## 2019-07-26 NOTE — Telephone Encounter (Signed)
Spoke with Nina Phillips about scheduling an appointment in this office to start her care. She was very grateful, and stated she would be here.

## 2019-07-27 ENCOUNTER — Ambulatory Visit
Admission: EM | Admit: 2019-07-27 | Discharge: 2019-07-27 | Disposition: A | Discharge status: Home / Self Care | Payer: 59

## 2019-07-27 ENCOUNTER — Other Ambulatory Visit: Payer: Self-pay

## 2019-07-27 ENCOUNTER — Encounter: Payer: Self-pay | Admitting: Emergency Medicine

## 2019-07-27 DIAGNOSIS — Z5189 Encounter for other specified aftercare: Secondary | ICD-10-CM | POA: Diagnosis not present

## 2019-07-27 MED ORDER — MUPIROCIN 2 % EX OINT: 1.0000 "application " | TOPICAL_OINTMENT | Freq: Two times a day (BID) | CUTANEOUS | 0 refills | Status: AC

## 2019-07-27 NOTE — ED Triage Notes (Signed)
Pt presents to [facility] for assessment after having nexplanon removed 1 week ago, after having it placed trhe week prior, and then finding out she was pregnant.  Patient c/o burning pain to area with some discharge noted to area.

## 2019-07-27 NOTE — ED Provider Notes (Signed)
[name]    CSN: [id number] Arrival date & time: 07/27/19  1616      History   Chief Complaint Chief Complaint  Patient presents with  . Arm Pain    HPI Nina Phillips is a 29 y.o. female with history of type 2 diabetes, gout, ovarian cysts presenting for left arm pain.  States she had her Nexplanon removed 1 week ago: Tolerated this well.  States she is currently pregnant.  No abdominal, pelvic pain, vaginal discharge or bleeding.  Denies fever, chills, chest pain, palpitations, difficulty breathing.   Past Medical History:  Diagnosis Date  . Anemia   . Anxiety   . Bicornate uterus   . Depression    seeing a therapist since 3rd miscarriage, helping, doing good.  . Diabetes mellitus without complication (HCC)    Type 2  . Gout   . History of miscarriage   . Ovarian cyst     Patient Active Problem List   Diagnosis Date Noted  . Intrauterine pregnancy 07/24/2019  . Abnormal uterine bleeding (AUB) 07/09/2019  . Type 2 diabetes mellitus with microalbuminuria, with long-term current use of insulin (HCC) 06/26/2019  . Trichomonas infection 08/24/2018  . 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 DILATION AND CURETTAGE OF UTERUS    . WISDOM TOOTH EXTRACTION      OB History    Gravida  5   Para      Term      Preterm      AB  4   Living  0     SAB  4   TAB      Ectopic      Multiple  0   Live Births               Home Medications    Prior to Admission medications   Medication Sig Start Date End Date Taking? Authorizing Provider  heparin 16967 UNIT/ML injection Inject 1,000 Units into the skin every 12 (twelve) hours.   Yes [provider]  Accu-Chek FastClix Lancets MISC USE TO CHECK BLOOD SUGAR FOUR TIMES DAILY AND AS NEEDED 07/17/18   [provider]  ACCU-CHEK GUIDE test strip USE 1 STRIP 4 TIMES DAILY AND AS NEEDED  07/17/18   [provider]  BD PEN NEEDLE NANO U/F 32G X 4 MM MISC USE ONE NEEDLE DAILY WITH INSULIN INJECTION 07/17/18   [provider]  canagliflozin (INVOKANA) 100 MG TABS tablet Take 1 tablet (100 mg total) by mouth daily before breakfast. 06/22/19   Shamleffer, Konrad Dolores, MD  insulin glargine (LANTUS SOLOSTAR) 100 UNIT/ML Solostar Pen Inject 20 Units into the skin daily. 06/22/19   Shamleffer, Konrad Dolores, MD  metFORMIN (GLUCOPHAGE) 1000 MG tablet Take 1 tablet (1,000 mg total) by mouth 2 (two) times daily with a meal. 09/07/18   Shamleffer, Konrad Dolores, MD  mupirocin ointment (BACTROBAN) 2 % Apply 1 application topically 2 (two) times daily for 7 days. 07/27/19 08/03/19  Hall-Potvin, [place], PA-C  diphenhydrAMINE (BENADRYL) 25 mg capsule Take 25 mg by mouth every 6 (six) hours as needed for allergies (and allergic reactions).   12/20/18  [provider]    Family History Family History  Problem Relation Age of Onset  . Cancer Other   . Hypertension Other   . Diabetes Other   . Hypertension Mother   . Gout Mother   .  Diabetes Father   . Hypertension Father   . Stroke Father   . Hypertension Maternal Grandfather   . Heart attack Maternal Grandfather   . Stroke Paternal Grandmother   . Hypertension Maternal Aunt   . Cancer Maternal Grandmother        breast  . Diabetes Paternal Grandfather     Social History Social History   Tobacco Use  . Smoking status: Former Games developer  . Smokeless tobacco: Never Used  . Tobacco comment: quit 2014  Vaping Use  . Vaping Use: Never used  Substance Use Topics  . Alcohol use: No    Comment: occas  . Drug use: No     Allergies   Apple fruit extract, Banana, Other, Shellfish allergy, Tomato, Watermelon [citrullus vulgaris], Bactrim [sulfamethoxazole-trimethoprim], and Sulfa antibiotics   Review of Systems As per HPI   Physical Exam Triage Vital Signs ED Triage Vitals  Enc Vitals Group     BP       Pulse      Resp      Temp      Temp src      SpO2      Weight      Height      Head Circumference      Peak Flow      Pain Score      Pain Loc      Pain Edu?      Excl. in GC?    No data found.  Updated Vital Signs BP 133/64 (BP Location: Left Arm)   Pulse 88   Temp 98.2 F (36.8 C) (Oral)   Resp 18   LMP 06/15/2019   SpO2 100%   Visual Acuity Right Eye Distance:   Left Eye Distance:   Bilateral Distance:    Right Eye Near:   Left Eye Near:    Bilateral Near:     Physical Exam Constitutional:      General: She is not in acute distress. HENT:     Head: Normocephalic and atraumatic.  Eyes:     General: No scleral icterus.    Pupils: Pupils are equal, round, and reactive to light.  Cardiovascular:     Rate and Rhythm: Normal rate.  Pulmonary:     Effort: Pulmonary effort is normal.  Musculoskeletal:        General: No swelling or deformity. Normal range of motion.     Comments: Mild tenderness over incisional site.  No mass, induration  Skin:    Coloration: Skin is not jaundiced or pale.     Comments: Incisional scar with simple interrupted suture noted to left bicep.  Well-healed, suture removed by me: Patient tolerated well.  No discharge, bleeding.  Neurological:     Mental Status: She is alert and oriented to person, place, and time.      UC Treatments / Results  Labs (all labs ordered are listed, but only abnormal results are displayed) Labs Reviewed - No data to display  EKG   Radiology No results found.  Procedures Procedures (including critical care time)  Medications Ordered in UC Medications - No data to display  Initial Impression / Assessment and Plan / UC Course  I have reviewed the triage vital signs and the nursing notes.  Pertinent labs & imaging results that were available during my care of the patient were reviewed by me and considered in my medical decision making (see chart for details).     Patient febrile,  nontoxic,  without cardiopulmonary symptoms as mentioned in HPI.  Simple interrupted suture removed in office by me which patient tolerated well.  Wound appears to be healing well, low concern for infectious process at this time.  Patient to treat supportively as outlined below, follow-up with OB/GYN for repeat evaluation in 1 week.  Return precautions discussed, patient verbalized understanding and is agreeable to plan. Final Clinical Impressions(s) / UC Diagnoses   Final diagnoses:  Visit for wound check     Discharge Instructions     Keep skin clean - may use gentle soaps without perfumes/dyes. Avoid hot water (showers, baths) as this can further dry out and irritate skin. Pat skin dry as rubbing can irritate and tear skin. Apply a gentle moisturizer 1-2 times daily.    ED Prescriptions    Medication Sig Dispense Auth. Provider   mupirocin ointment (BACTROBAN) 2 % Apply 1 application topically 2 (two) times daily for 7 days. 22 g Hall-Potvin, [place], PA-C     PDMP not reviewed this encounter.   Hall-Potvin, [place], [place] 07/28/19 1055

## 2019-07-27 NOTE — Discharge Instructions (Signed)
Keep skin clean - may use gentle soaps without perfumes/dyes. Avoid hot water (showers, baths) as this can further dry out and irritate skin. Pat skin dry as rubbing can irritate and tear skin. Apply a gentle moisturizer 1-2 times daily. 

## 2019-07-28 ENCOUNTER — Encounter: Payer: Self-pay | Admitting: Emergency Medicine

## 2019-08-06 ENCOUNTER — Other Ambulatory Visit (HOSPITAL_COMMUNITY)
Admission: RE | Admit: 2019-08-06 | Discharge: 2019-08-06 | Disposition: A | Discharge status: Home / Self Care | Payer: 59 | Source: Ambulatory Visit | Attending: Obstetrics and Gynecology | Admitting: Obstetrics and Gynecology

## 2019-08-06 ENCOUNTER — Other Ambulatory Visit: Payer: Self-pay

## 2019-08-06 ENCOUNTER — Ambulatory Visit (INDEPENDENT_AMBULATORY_CARE_PROVIDER_SITE_OTHER): Payer: 59 | Admitting: Obstetrics and Gynecology

## 2019-08-06 ENCOUNTER — Encounter: Payer: Self-pay | Admitting: Obstetrics and Gynecology

## 2019-08-06 VITALS — BP 151/84 | HR 71 | Wt 251.8 lb

## 2019-08-06 DIAGNOSIS — O98511 Other viral diseases complicating pregnancy, first trimester: Secondary | ICD-10-CM

## 2019-08-06 DIAGNOSIS — Z794 Long term (current) use of insulin: Secondary | ICD-10-CM

## 2019-08-06 DIAGNOSIS — O0990 Supervision of high risk pregnancy, unspecified, unspecified trimester: Secondary | ICD-10-CM | POA: Diagnosis present

## 2019-08-06 DIAGNOSIS — E1129 Type 2 diabetes mellitus with other diabetic kidney complication: Secondary | ICD-10-CM | POA: Diagnosis not present

## 2019-08-06 DIAGNOSIS — Z8759 Personal history of other complications of pregnancy, childbirth and the puerperium: Secondary | ICD-10-CM

## 2019-08-06 DIAGNOSIS — B009 Herpesviral infection, unspecified: Secondary | ICD-10-CM

## 2019-08-06 DIAGNOSIS — O0991 Supervision of high risk pregnancy, unspecified, first trimester: Secondary | ICD-10-CM | POA: Diagnosis not present

## 2019-08-06 DIAGNOSIS — Z3A01 Less than 8 weeks gestation of pregnancy: Secondary | ICD-10-CM

## 2019-08-06 DIAGNOSIS — R809 Proteinuria, unspecified: Secondary | ICD-10-CM

## 2019-08-06 DIAGNOSIS — O24111 Pre-existing diabetes mellitus, type 2, in pregnancy, first trimester: Secondary | ICD-10-CM | POA: Diagnosis not present

## 2019-08-06 DIAGNOSIS — N96 Recurrent pregnancy loss: Secondary | ICD-10-CM

## 2019-08-06 DIAGNOSIS — O2621 Pregnancy care for patient with recurrent pregnancy loss, first trimester: Secondary | ICD-10-CM

## 2019-08-06 LAB — POCT URINALYSIS DIP (DEVICE)
Bilirubin Urine: NEGATIVE
Glucose, UA: NEGATIVE mg/dL
Hgb urine dipstick: NEGATIVE
Ketones, ur: NEGATIVE mg/dL
Leukocytes,Ua: NEGATIVE
Nitrite: NEGATIVE
Protein, ur: NEGATIVE mg/dL
Specific Gravity, Urine: <1.005 — ABNORMAL LOW (ref 1.005–1.030)
Urobilinogen, UA: 0.2 mg/dL (ref 0.0–1.0)
pH: 5.5 (ref 5.0–8.0)

## 2019-08-06 NOTE — Patient Instructions (Signed)
First Trimester of Pregnancy  The first trimester of pregnancy is from week 1 until the end of week 13 (months 1 through 3). During this time, your baby will begin to develop inside you. At 6-8 weeks, the eyes and face are formed, and the heartbeat can be seen on ultrasound. At the end of 12 weeks, all the baby's organs are formed. Prenatal care is all the medical care you receive before the birth of your baby. Make sure you get good prenatal care and follow all of your doctor's instructions. Follow these instructions at home: Medicines  Take over-the-counter and prescription medicines only as told by your doctor. Some medicines are safe and some medicines are not safe during pregnancy.  Take a prenatal vitamin that contains at least 600 micrograms (mcg) of folic acid.  If you have trouble pooping (constipation), take medicine that will make your stool soft (stool softener) if your doctor approves. Eating and drinking   Eat regular, healthy meals.  Your doctor will tell you the amount of weight gain that is right for you.  Avoid raw meat and uncooked cheese.  If you feel sick to your stomach (nauseous) or throw up (vomit): ? Eat 4 or 5 small meals a day instead of 3 large meals. ? Try eating a few soda crackers. ? Drink liquids between meals instead of during meals.  To prevent constipation: ? Eat foods that are high in fiber, like fresh fruits and vegetables, whole grains, and beans. ? Drink enough fluids to keep your pee (urine) clear or pale yellow. Activity  Exercise only as told by your doctor. Stop exercising if you have cramps or pain in your lower belly (abdomen) or low back.  Do not exercise if it is too hot, too humid, or if you are in a place of great height (high altitude).  Try to avoid standing for long periods of time. Move your legs often if you must stand in one place for a long time.  Avoid heavy lifting.  Wear low-heeled shoes. Sit and stand up  straight.  You can have sex unless your doctor tells you not to. Relieving pain and discomfort  Wear a good support bra if your breasts are sore.  Take warm water baths (sitz baths) to soothe pain or discomfort caused by hemorrhoids. Use hemorrhoid cream if your doctor says it is okay.  Rest with your legs raised if you have leg cramps or low back pain.  If you have puffy, bulging veins (varicose veins) in your legs: ? Wear support hose or compression stockings as told by your doctor. ? Raise (elevate) your feet for 15 minutes, 3-4 times a day. ? Limit salt in your food. Prenatal care  Schedule your prenatal visits by the twelfth week of pregnancy.  Write down your questions. Take them to your prenatal visits.  Keep all your prenatal visits as told by your doctor. This is important. Safety  Wear your seat belt at all times when driving.  Make a list of emergency phone numbers. The list should include numbers for family, friends, the hospital, and police and fire departments. General instructions  Ask your doctor for a referral to a local prenatal class. Begin classes no later than at the start of month 6 of your pregnancy.  Ask for help if you need counseling or if you need help with nutrition. Your doctor can give you advice or tell you where to go for help.  Do not use hot tubs, steam   rooms, or saunas.  Do not douche or use tampons or scented sanitary pads.  Do not cross your legs for long periods of time.  Avoid all herbs and alcohol. Avoid drugs that are not approved by your doctor.  Do not use any tobacco products, including cigarettes, chewing tobacco, and electronic cigarettes. If you need help quitting, ask your doctor. You may get counseling or other support to help you quit.  Avoid cat litter boxes and soil used by cats. These carry germs that can cause birth defects in the baby and can cause a loss of your baby (miscarriage) or stillbirth.  Visit your dentist.  At home, brush your teeth with a soft toothbrush. Be gentle when you floss. Contact a doctor if:  You are dizzy.  You have mild cramps or pressure in your lower belly.  You have a nagging pain in your belly area.  You continue to feel sick to your stomach, you throw up, or you have watery poop (diarrhea).  You have a bad smelling fluid coming from your vagina.  You have pain when you pee (urinate).  You have increased puffiness (swelling) in your face, hands, legs, or ankles. Get help right away if:  You have a fever.  You are leaking fluid from your vagina.  You have spotting or bleeding from your vagina.  You have very bad belly cramping or pain.  You gain or lose weight rapidly.  You throw up blood. It may look like coffee grounds.  You are around people who have German measles, fifth disease, or chickenpox.  You have a very bad headache.  You have shortness of breath.  You have any kind of trauma, such as from a fall or a car accident. Summary  The first trimester of pregnancy is from week 1 until the end of week 13 (months 1 through 3).  To take care of yourself and your unborn baby, you will need to eat healthy meals, take medicines only if your doctor tells you to do so, and do activities that are safe for you and your baby.  Keep all follow-up visits as told by your doctor. This is important as your doctor will have to ensure that your baby is healthy and growing well. This information is not intended to replace advice given to you by your health care provider. Make sure you discuss any questions you have with your health care provider. Document Revised: 05/04/2018 Document Reviewed: 01/20/2016 Elsevier Patient Education  2020 [employer]  

## 2019-08-06 NOTE — Progress Notes (Signed)
INITIAL PRENATAL VISIT NOTE  Subjective:  Nina Phillips is a 29 y.o. G5P0040 at [redacted]w[redacted]d by LMP being seen today for her initial prenatal visit. This is a planned pregnancy. She and partner are happy, but concerned  with the pregnancy. She was using nothing for birth control previously. She has an obstetric history significant for recurrent pregnancy loss and bicornuate uterus. She has a medical history significant for HSV infx and type 2 diabetes treated for one year.  Patient reports no complaints.  Contractions: Not present. Vag. Bleeding: None.  Movement: Absent. Denies leaking of fluid.    Past Medical History:  Diagnosis Date   Anemia    Anxiety    Bicornate uterus    Depression    seeing a therapist since 3rd miscarriage, helping, doing good.   Diabetes mellitus without complication (HCC)    Type 2   Gout    History of miscarriage    Ovarian cyst     Past Surgical History:  Procedure Laterality Date   DILATION AND CURETTAGE OF UTERUS     WISDOM TOOTH EXTRACTION      OB History  Gravida Para Term Preterm AB Living  5       4 0  SAB TAB Ectopic Multiple Live Births  4     0      # Outcome Date GA Lbr Len/2nd Weight Sex Delivery Anes PTL Lv  5 Current           4 SAB 08/2018 [redacted]w[redacted]d            Birth Comments: missed Ab, CRL c/w [redacted]w[redacted]d, cytotec  3 SAB 03/2017          2 SAB 09/2016          1 SAB 07/2016            Social History   Socioeconomic History   Marital status: Single    Spouse name: Not on file   Number of children: Not on file   Years of education: Not on file   Highest education level: Not on file  Occupational History   Not on file  Tobacco Use   Smoking status: Former Smoker   Smokeless tobacco: Never Used   Tobacco comment: quit 2014  Vaping Use   Vaping Use: Never used  Substance and Sexual Activity   Alcohol use: No    Comment: occas   Drug use: No   Sexual activity: Yes    Birth control/protection: Implant     Comment: taken out on 07/20/19  Other Topics Concern   Not on file  Social History Narrative   Not on file   Social Determinants of Health   Financial Resource Strain:    Difficulty of Paying Living Expenses:   Food Insecurity: No Food Insecurity   Worried About Running Out of Food in the Last Year: Never true   Ran Out of Food in the Last Year: Never true  Transportation Needs: No Transportation Needs   Lack of Transportation (Medical): No   Lack of Transportation (Non-Medical): No  Physical Activity:    Days of Exercise per Week:    Minutes of Exercise per Session:   Stress:    Feeling of Stress :   Social Connections:    Frequency of Communication with Friends and Family:    Frequency of Social Gatherings with Friends and Family:    Attends Religious Services:    Active Member of Clubs or Organizations:  Attends Engineer, structural:    Marital Status:     Family History  Problem Relation Age of Onset   Cancer Other    Hypertension Other    Diabetes Other    Hypertension Mother    Gout Mother    Diabetes Father    Hypertension Father    Stroke Father    Hypertension Maternal Grandfather    Heart attack Maternal Grandfather    Stroke Paternal Grandmother    Hypertension Maternal Aunt    Cancer Maternal Grandmother        breast   Diabetes Paternal Grandfather      Current Outpatient Medications:    Accu-Chek FastClix Lancets MISC, USE TO CHECK BLOOD SUGAR FOUR TIMES DAILY AND AS NEEDED, Disp: , Rfl:    ACCU-CHEK GUIDE test strip, USE 1 STRIP 4 TIMES DAILY AND AS NEEDED, Disp: , Rfl:    BD PEN NEEDLE NANO U/F 32G X 4 MM MISC, USE ONE NEEDLE DAILY WITH INSULIN INJECTION, Disp: , Rfl:    heparin 38466 UNIT/ML injection, Inject 1,000 Units into the skin every 12 (twelve) hours., Disp: , Rfl:    insulin glargine (LANTUS SOLOSTAR) 100 UNIT/ML Solostar Pen, Inject 20 Units into the skin daily., Disp: 15 mL, Rfl: 6    metFORMIN (GLUCOPHAGE) 1000 MG tablet, Take 1 tablet (1,000 mg total) by mouth 2 (two) times daily with a meal., Disp: 180 tablet, Rfl: 3   canagliflozin (INVOKANA) 100 MG TABS tablet, Take 1 tablet (100 mg total) by mouth daily before breakfast. (Patient not taking: Reported on 08/06/2019), Disp: 30 tablet, Rfl: 4  Allergies  Allergen Reactions   Apple Fruit Extract Hives   Banana Hives   Other Hives    Walnuts    Shellfish Allergy Hives   Tomato Hives   Watermelon [Citrullus Vulgaris] Hives   Bactrim [Sulfamethoxazole-Trimethoprim] Hives and Rash   Sulfa Antibiotics Hives and Rash    Review of Systems: Negative except for what is mentioned in HPI.  Objective:   Vitals:   08/06/19 1126  BP: (!) 151/84  Pulse: 71  Weight: 251 lb 12.8 oz (114.2 kg)    Fetal Status:     Movement: Absent     Physical Exam: BP (!) 151/84    Pulse 71    Wt 251 lb 12.8 oz (114.2 kg)    LMP 06/15/2019    BMI 43.22 kg/m  CONSTITUTIONAL: Well-developed, well-nourished female in no acute distress.  NEUROLOGIC: Alert and oriented to person, place, and time. Normal reflexes, muscle tone coordination. No cranial nerve deficit noted. PSYCHIATRIC: Normal mood and affect. Normal behavior. Normal judgment and thought content. SKIN: Skin is warm and dry. No rash noted. Not diaphoretic. No erythema. No pallor. HENT:  Normocephalic, atraumatic, External right and left ear normal. Oropharynx is clear and moist EYES: Conjunctivae and EOM are normal. Pupils are equal, round, and reactive to light. No scleral icterus.  NECK: Normal range of motion, supple, no masses CARDIOVASCULAR: Normal heart rate noted, regular rhythm RESPIRATORY: Effort and breath sounds normal, no problems with respiration noted BREASTS: symmetric, non-tender, no masses palpable ABDOMEN: Soft, obese, nontender, nondistended, gravid. GU: normal appearing external female genitalia, normal appearing cervix, scant white discharge in  vagina, no lesions noted Bimanual: 7 weeks sized uterus, no adnexal tenderness or palpable lesions noted MUSCULOSKELETAL: Normal range of motion. EXT:  No edema and no tenderness. 2+ distal pulses.  Bedside u/s showed gestational sac and yolk sac, unsure of fetal pole due  to transabdominal scan and body habitus. Assessment and Plan:  Pregnancy: G5P0040 at [redacted]w[redacted]d by LMP  1. Supervision of high risk pregnancy, antepartum Pt has viability scan on 7/13 with TVUS - CBC/D/Plt+RPR+Rh+ABO+Rub Ab... - Comprehensive metabolic panel - Culture, OB Urine - Hemoglobin A1c - Protein / creatinine ratio, urine - TSH - Cervicovaginal ancillary only( [place]) - [name] MFM OB DETAIL +14 WK; Future - [facility] AMB BABYSCRIPTS SCHEDULE OPTIMIZATION  2. Type 2 diabetes mellitus with microalbuminuria, with long-term current use of insulin (HCC) Pt states she has appointment with endocrinologist next week and notes compliance with BS with 6 BS checks per day.  3. HSV-2 infection Treat at 36 weeks with prophylaxis  4. History of recurrent miscarriages Pt currently on heparin per fertility specialist.  Per pt she has a "clotting issue" but did not know the name.  She was sure it was not lupus related.  Request any labs or diagnoses from Fertility specialist   Preterm labor symptoms and general obstetric precautions including but not limited to vaginal bleeding, contractions, leaking of fluid and fetal movement were reviewed in detail with the patient.  Please refer to After Visit Summary for other counseling recommendations.   Return in about 3 weeks (around 08/27/2019) for [facility].  Warden Fillers 08/06/2019 1:09 PM

## 2019-08-07 ENCOUNTER — Encounter: Payer: Self-pay | Admitting: *Deleted

## 2019-08-07 ENCOUNTER — Ambulatory Visit
Admission: RE | Admit: 2019-08-07 | Discharge: 2019-08-07 | Disposition: A | Discharge status: Home / Self Care | Payer: 59 | Source: Ambulatory Visit | Attending: Advanced Practice Midwife | Admitting: Advanced Practice Midwife

## 2019-08-07 ENCOUNTER — Ambulatory Visit (INDEPENDENT_AMBULATORY_CARE_PROVIDER_SITE_OTHER): Payer: 59 | Admitting: *Deleted

## 2019-08-07 VITALS — BP 122/79 | HR 69

## 2019-08-07 DIAGNOSIS — Z3490 Encounter for supervision of normal pregnancy, unspecified, unspecified trimester: Secondary | ICD-10-CM | POA: Diagnosis not present

## 2019-08-07 DIAGNOSIS — Z712 Person consulting for explanation of examination or test findings: Secondary | ICD-10-CM

## 2019-08-07 DIAGNOSIS — Z3A01 Less than 8 weeks gestation of pregnancy: Secondary | ICD-10-CM

## 2019-08-07 DIAGNOSIS — O0990 Supervision of high risk pregnancy, unspecified, unspecified trimester: Secondary | ICD-10-CM

## 2019-08-07 DIAGNOSIS — O3680X0 Pregnancy with inconclusive fetal viability, not applicable or unspecified: Secondary | ICD-10-CM

## 2019-08-07 LAB — COMPREHENSIVE METABOLIC PANEL
ALT: 14 IU/L (ref 0–32)
AST: 15 IU/L (ref 0–40)
Albumin/Globulin Ratio: 1.1 — ABNORMAL LOW (ref 1.2–2.2)
Albumin: 3.7 g/dL — ABNORMAL LOW (ref 3.9–5.0)
Alkaline Phosphatase: 103 IU/L (ref 48–121)
BUN/Creatinine Ratio: 7 — ABNORMAL LOW (ref 9–23)
BUN: 6 mg/dL (ref 6–20)
Bilirubin Total: <0.2 mg/dL (ref 0.0–1.2)
CO2: 20 mmol/L (ref 20–29)
Calcium: 9.4 mg/dL (ref 8.7–10.2)
Chloride: 101 mmol/L (ref 96–106)
Creatinine, Ser: 0.89 mg/dL (ref 0.57–1.00)
GFR calc Af Amer: 103 mL/min/{1.73_m2} (ref >59)
GFR calc non Af Amer: 89 mL/min/{1.73_m2} (ref >59)
Globulin, Total: 3.5 g/dL (ref 1.5–4.5)
Glucose: 122 mg/dL — ABNORMAL HIGH (ref 65–99)
Potassium: 4.4 mmol/L (ref 3.5–5.2)
Sodium: 136 mmol/L (ref 134–144)
Total Protein: 7.2 g/dL (ref 6.0–8.5)

## 2019-08-07 LAB — CBC/D/PLT+RPR+RH+ABO+RUB AB...
Antibody Screen: NEGATIVE
Basophils Absolute: 0.1 10*3/uL (ref 0.0–0.2)
Basos: 1 %
EOS (ABSOLUTE): 0.1 10*3/uL (ref 0.0–0.4)
Eos: 1 %
HCV Ab: <0.1 s/co ratio (ref 0.0–0.9)
HIV Screen 4th Generation wRfx: NONREACTIVE
Hematocrit: 35.4 % (ref 34.0–46.6)
Hemoglobin: 10.8 g/dL — ABNORMAL LOW (ref 11.1–15.9)
Hepatitis B Surface Ag: NEGATIVE
Immature Grans (Abs): 0 10*3/uL (ref 0.0–0.1)
Immature Granulocytes: 0 %
Lymphocytes Absolute: 3.4 10*3/uL — ABNORMAL HIGH (ref 0.7–3.1)
Lymphs: 34 %
MCH: 23.5 pg — ABNORMAL LOW (ref 26.6–33.0)
MCHC: 30.5 g/dL — ABNORMAL LOW (ref 31.5–35.7)
MCV: 77 fL — ABNORMAL LOW (ref 79–97)
Monocytes Absolute: 0.6 10*3/uL (ref 0.1–0.9)
Monocytes: 6 %
Neutrophils Absolute: 6 10*3/uL (ref 1.4–7.0)
Neutrophils: 58 %
Platelets: 428 10*3/uL (ref 150–450)
RBC: 4.59 x10E6/uL (ref 3.77–5.28)
RDW: 16.3 % — ABNORMAL HIGH (ref 11.7–15.4)
RPR Ser Ql: NONREACTIVE
Rh Factor: POSITIVE
Rubella Antibodies, IGG: 2.84 index (ref >0.99)
WBC: 10.2 10*3/uL (ref 3.4–10.8)

## 2019-08-07 LAB — HEMOGLOBIN A1C
Est. average glucose Bld gHb Est-mCnc: 174 mg/dL
Hgb A1c MFr Bld: 7.7 % — ABNORMAL HIGH (ref 4.8–5.6)

## 2019-08-07 LAB — CERVICOVAGINAL ANCILLARY ONLY
Chlamydia: NEGATIVE
Comment: NEGATIVE
Comment: NORMAL
Neisseria Gonorrhea: NEGATIVE

## 2019-08-07 LAB — PROTEIN / CREATININE RATIO, URINE
Creatinine, Urine: 21.7 mg/dL
Protein, Ur: 9.8 mg/dL
Protein/Creat Ratio: 452 mg/g creat — ABNORMAL HIGH (ref 0–200)

## 2019-08-07 LAB — HCV INTERPRETATION

## 2019-08-07 LAB — TSH: TSH: 3.28 u[IU]/mL (ref 0.450–4.500)

## 2019-08-07 NOTE — Progress Notes (Signed)
[name] results reviewed by Dr. Shawnie Pons. Pt was informed of IUP @ [redacted]w[redacted]d with low heart rate. She endorses occasional mild cramping however denies having vaginal bleeding. Pt was advised of need for repeat US which will be scheduled. She was instructed to go to MAU if she develops strong abdominal/pelvic pain. She voiced understanding of all information and instructions given.

## 2019-08-07 NOTE — Progress Notes (Signed)
Patient seen and assessed by nursing staff.  Agree with documentation and plan.  

## 2019-08-11 LAB — URINE CULTURE, OB REFLEX

## 2019-08-11 LAB — CULTURE, OB URINE

## 2019-08-12 NOTE — Progress Notes (Signed)
Name: Nina Phillips  Age/ Sex: 29 y.o., female   MRN/ DOB: [id number], [birth year]     PCP: Arvilla Market, DO   Reason for Endocrinology Evaluation: Type 2 Diabetes Mellitus  Initial Endocrine Consultative Visit:  09/08/2018    PATIENT IDENTIFIER: Nina Phillips is a 29 y.o. female with a past medical history of T2DM. The patient has followed with Endocrinology clinic since 09/08/2018 for consultative assistance with management of her diabetes.  DIABETIC HISTORY:  [name] was diagnosed with T2 DM in 06/2018. Her hemoglobin A1c has ranged from 10.6% on her initial diagnosis.  On her initial visit to our clinic she had an A1c of 10.3%, she was on Lantus only, we started Metformin. Invokana caused yeast infection .     She had ~ 5 miscarriages in 2020, the last one was 07/2018 Lives with boyfriend and his grandmother  SUBJECTIVE:   During the last visit (06/22/2019): A1c 9.1 %. Lantus increased, continued metformin and started Invokana   Today (08/13/2019): [name] is here for a follow up on diabetes.   She checks her blood sugars 3 times daily. The patient has not had hypoglycemic episodes since the last clinic visit.  She is currently 8.3 weeks of gestation She is having a lot of nausea with vomiting.      HOME DIABETES REGIMEN:  Metformin 1000 tablet, half a tablet twice a day  Lantus 20 units daily      METER DOWNLOAD SUMMARY: 6/20-7/19/2021 Fingerstick Blood Glucose Tests = 78 Average Number Tests/Day = 3 Overall Mean FS Glucose = 137 Standard Deviation = 38.6  BG Ranges: Low = 73 High = 266   Hypoglycemic Events/30 Days: BG < 50 = 0 Episodes of symptomatic severe hypoglycemia = 0       DIABETIC COMPLICATIONS: Microvascular complications:    Denies: CKD, Neuropathy, retinopathy   Last Eye Exam: Completed   Macrovascular complications:    Denies: CAD, CVA, PVD   HISTORY:  Past Medical History:  Past Medical History:    Diagnosis Date  . Anemia   . Anxiety   . Bicornate uterus   . Depression    seeing a therapist since 3rd miscarriage, helping, doing good.  . Diabetes mellitus without complication (HCC)    Type 2  . Gout   . History of miscarriage   . Ovarian cyst    Past Surgical History:  Past Surgical History:  Procedure Laterality Date  . DILATION AND CURETTAGE OF UTERUS    . WISDOM TOOTH EXTRACTION      Social History:  reports that she has quit smoking. She has never used smokeless tobacco. She reports that she does not drink alcohol and does not use drugs. Family History:  Family History  Problem Relation Age of Onset  . Cancer Other   . Hypertension Other   . Diabetes Other   . Hypertension Mother   . Gout Mother   . Diabetes Father   . Hypertension Father   . Stroke Father   . Hypertension Maternal Grandfather   . Heart attack Maternal Grandfather   . Stroke Paternal Grandmother   . Hypertension Maternal Aunt   . Cancer Maternal Grandmother        breast  . Diabetes Paternal Grandfather      HOME MEDICATIONS: Allergies as of 08/13/2019      Reactions   Apple Fruit Extract Hives   Banana Hives   Other Hives   Walnuts   Shellfish Allergy  Hives   Tomato Hives   Watermelon [citrullus Vulgaris] Hives   Bactrim [sulfamethoxazole-trimethoprim] Hives, Rash   Sulfa Antibiotics Hives, Rash      Medication List       Accurate as of August 13, 2019 12:28 PM. If you have any questions, ask your nurse or doctor.        STOP taking these medications   canagliflozin 100 MG Tabs tablet Commonly known as: INVOKANA Stopped by: Scarlette Shorts, MD     TAKE these medications   Accu-Chek FastClix Lancets Misc USE TO CHECK BLOOD SUGAR FOUR TIMES DAILY AND AS NEEDED   Accu-Chek Guide test strip Generic drug: glucose blood USE 1 STRIP 4 TIMES DAILY AND AS NEEDED   BD Pen Needle Nano U/F 32G X 4 MM Misc Generic drug: Insulin Pen Needle USE ONE NEEDLE DAILY WITH  INSULIN INJECTION   Dexcom G6 Receiver Devi 1 Device by Does not apply route as directed. Started by: Scarlette Shorts, MD   Dexcom G6 Sensor Misc 1 Device by Does not apply route as directed. Started by: Scarlette Shorts, MD   Dexcom G6 Transmitter Misc 1 Device by Does not apply route as directed. Started by: Scarlette Shorts, MD   heparin 38466 UNIT/ML injection Inject 1,000 Units into the skin every 12 (twelve) hours.   Lantus SoloStar 100 UNIT/ML Solostar Pen Generic drug: insulin glargine Inject 20 Units into the skin daily.   metFORMIN 1000 MG tablet Commonly known as: GLUCOPHAGE Take 1 tablet (1,000 mg total) by mouth 2 (two) times daily with a meal.        OBJECTIVE:   Vital Signs: BP 122/84 (BP Location: Left Arm, Patient Position: Sitting, Cuff Size: Large)   Pulse 89   Ht 5\' 4"  (1.626 m)   Wt 246 lb (111.6 kg)   LMP 06/15/2019   SpO2 97%   BMI 42.23 kg/m   Wt Readings from Last 3 Encounters:  08/13/19 246 lb (111.6 kg)  08/06/19 251 lb 12.8 oz (114.2 kg)  07/20/19 246 lb (111.6 kg)     Exam: General: Pt appears well and is in NAD  Lungs: Clear with good BS bilat with no rales, rhonchi, or wheezes  Heart: RRR with normal S1 and S2 and no gallops; no murmurs; no rub  Abdomen: Normoactive bowel sounds, soft, nontender, without masses or organomegaly palpable  Extremities: No pretibial edema  Skin: Normal texture and temperature to palpation.   Neuro: MS is good with appropriate affect, pt is alert and Ox3    DM foot exam: 06/22/2019    The skin of the feet is intact without sores or ulcerations. The pedal pulses are 2+ on right and 2+ on left. The sensation is intact to a screening 5.07, 10 gram monofilament bilaterally          DATA REVIEWED:  Lab Results  Component Value Date   HGBA1C 7.7 (H) 08/06/2019   HGBA1C 9.1 (A) 06/22/2019   HGBA1C 10.3 (H) 08/09/2018   Lab Results  Component Value Date   MICROALBUR 31.3  06/22/2019   LDLCALC 143 (H) 06/22/2019   CREATININE 0.89 08/06/2019    ASSESSMENT / PLAN / RECOMMENDATIONS:   1) Type 2 Diabetes Mellitus, Improving control, With microalbuminuria complications - Most recent A1c of 7.7%. Goal A1c < 6.5 %.    - Improved glycemic control but she understands the goal during pregnancy is 6-6.5%  - She has been doing well following a low carb diet,  her fasting BG's have been at goal, but she tends to have hyperglycemia after lunch - We discussed limiting carb intake during lunch - Will try and prescribe Dexcom  - Pt to notify us should her Bg's remain above pregnancy goals    TIMING  BLOOD SUGAR GOALS  FASTING (before breakfast)  60 - 95  BEFORE MEALS LESS THAN 100  2 hours AFTER MEAL LESS THAN 120    MEDICATIONS: - Continue Metformin 1000 mg, Half a tablet twice a day - Continue  Lantus 20 units daily    EDUCATION / INSTRUCTIONS:  BG monitoring instructions: Patient is instructed to check her blood sugars 1 times a day, fasting   Call [place] [facility] if: BG persistently < 70 or > 300. . I reviewed the Rule of 15 for the treatment of hypoglycemia in detail with the patient. Literature supplied.     2) Diabetic complications:   Eye: Does not have known diabetic retinopathy.   Neuro/ Feet: Does not have known diabetic peripheral neuropathy.  Renal: Patient does not have known baseline CKD. But has microalbuminuria  F/U in 2 months    Signed electronically by: Lyndle Herrlich, MD  [facility]  [facility] [address] [place] [place], [place] [address] Phone: [phone] FAX: [phone]   CC: Arvilla Market, DO [address] [place] [address] Phone: [phone]  Fax: [phone]  Return to Endocrinology clinic as below: Future Appointments  Date Time Provider Department Center  08/17/2019 10:00 AM [facility]1 [facility]  08/17/2019 10:40 AM [facility]  NURSE [facility]  08/27/2019 10:15 AM Barrera Bing, MD [facility]  09/28/2019  3:20 PM Dawn Kiper, Konrad Dolores, MD [id number] None

## 2019-08-13 ENCOUNTER — Other Ambulatory Visit: Payer: Self-pay

## 2019-08-13 ENCOUNTER — Encounter: Payer: Self-pay | Admitting: Internal Medicine

## 2019-08-13 ENCOUNTER — Ambulatory Visit (INDEPENDENT_AMBULATORY_CARE_PROVIDER_SITE_OTHER): Payer: 59 | Admitting: Internal Medicine

## 2019-08-13 VITALS — BP 122/84 | HR 89 | Ht 64.0 in | Wt 246.0 lb

## 2019-08-13 DIAGNOSIS — E1129 Type 2 diabetes mellitus with other diabetic kidney complication: Secondary | ICD-10-CM

## 2019-08-13 DIAGNOSIS — O24311 Unspecified pre-existing diabetes mellitus in pregnancy, first trimester: Secondary | ICD-10-CM | POA: Diagnosis not present

## 2019-08-13 DIAGNOSIS — Z794 Long term (current) use of insulin: Secondary | ICD-10-CM

## 2019-08-13 DIAGNOSIS — E1165 Type 2 diabetes mellitus with hyperglycemia: Secondary | ICD-10-CM

## 2019-08-13 DIAGNOSIS — R809 Proteinuria, unspecified: Secondary | ICD-10-CM

## 2019-08-13 MED ORDER — DEXCOM G6 TRANSMITTER MISC: 1.0000 | 3 refills | Status: DC

## 2019-08-13 MED ORDER — DEXCOM G6 SENSOR MISC: 1.0000 | 11 refills | Status: DC

## 2019-08-13 MED ORDER — DEXCOM G6 RECEIVER DEVI: 1.0000 | 0 refills | Status: DC

## 2019-08-13 NOTE — Patient Instructions (Addendum)
-   Continue Metformin 1000 mg, Half a tablet twice a day - Continue  Lantus 20 units daily    TIMING  BLOOD SUGAR GOALS  FASTING (before breakfast)  60 - 95  BEFORE MEALS LESS THAN 100  2 hours AFTER MEAL LESS THAN 120      - Please notify us if your sugars are above the goal as above     HOW TO TREAT LOW BLOOD SUGARS (Blood sugar LESS THAN 60 MG/DL)  Please follow the RULE OF 15 for the treatment of hypoglycemia treatment (when your (blood sugars are less than 60 mg/dL)    STEP 1: Take 15 grams of carbohydrates when your blood sugar is low, which includes:   3-4 GLUCOSE TABS  OR  3-4 OZ OF JUICE OR REGULAR SODA OR  ONE TUBE OF GLUCOSE GEL     STEP 2: RECHECK blood sugar in 15 MINUTES STEP 3: If your blood sugar is still low at the 15 minute recheck --> then, go back to STEP 1 and treat AGAIN with another 15 grams of carbohydrates.

## 2019-08-17 ENCOUNTER — Ambulatory Visit (INDEPENDENT_AMBULATORY_CARE_PROVIDER_SITE_OTHER): Payer: 59 | Admitting: *Deleted

## 2019-08-17 ENCOUNTER — Ambulatory Visit
Admission: RE | Admit: 2019-08-17 | Discharge: 2019-08-17 | Disposition: A | Discharge status: Home / Self Care | Payer: 59 | Source: Ambulatory Visit | Attending: Family Medicine | Admitting: Family Medicine

## 2019-08-17 ENCOUNTER — Other Ambulatory Visit: Payer: Self-pay

## 2019-08-17 ENCOUNTER — Ambulatory Visit: Payer: 59 | Admitting: Obstetrics & Gynecology

## 2019-08-17 VITALS — BP 131/72 | HR 89 | Ht 64.0 in | Wt 246.0 lb

## 2019-08-17 DIAGNOSIS — O3680X0 Pregnancy with inconclusive fetal viability, not applicable or unspecified: Secondary | ICD-10-CM | POA: Diagnosis present

## 2019-08-17 DIAGNOSIS — O0990 Supervision of high risk pregnancy, unspecified, unspecified trimester: Secondary | ICD-10-CM | POA: Diagnosis not present

## 2019-08-17 DIAGNOSIS — Z712 Person consulting for explanation of examination or test findings: Secondary | ICD-10-CM

## 2019-08-17 NOTE — Progress Notes (Signed)
[name] results reviewed by Dr. Macon Large. Pt was informed of results now showing normal FHR of 112 bpm. Pt denies having pelvic pain. She does endorse some occasional pink spotting. She was advised to go to the MAU @ [facility] if she develops heavy vaginal bleeding or severe abdominal/pelvic pain. She may keep appt for prenatal care on 8/2 as scheduled. Pt voiced understanding of all information and instructions given.

## 2019-08-17 NOTE — Progress Notes (Signed)
Patient was assessed and managed by nursing staff during this encounter. I have reviewed the chart and agree with the documentation and plan. I have also made any necessary editorial changes.  Jaynie Collins, MD 08/17/2019 12:18 PM

## 2019-08-20 ENCOUNTER — Telehealth: Payer: Self-pay | Admitting: Internal Medicine

## 2019-08-20 MED ORDER — NOVOLOG FLEXPEN 100 UNIT/ML ~~LOC~~ SOPN: 6.0000 [IU] | PEN_INJECTOR | Freq: Three times a day (TID) | SUBCUTANEOUS | 11 refills | Status: DC

## 2019-08-20 MED ORDER — INSULIN PEN NEEDLE 32G X 4 MM MISC: 1.0000 | Freq: Four times a day (QID) | 3 refills | Status: DC

## 2019-08-20 NOTE — Telephone Encounter (Signed)
Patient called asking to speak with Dr Lonzo Cloud or Andreas Blower regarding her insulin and keeping her sugars balanced. Ph# [phone]

## 2019-08-20 NOTE — Telephone Encounter (Signed)
Pt aware.

## 2019-08-20 NOTE — Telephone Encounter (Signed)
Pt called stating that a discussion was had during visit about placing her on a short acting insulin which she would like sent to her pharmacy. Please advise.

## 2019-08-23 ENCOUNTER — Encounter: Payer: 59 | Admitting: Obstetrics and Gynecology

## 2019-08-27 ENCOUNTER — Ambulatory Visit: Admission: EM | Admit: 2019-08-27 | Discharge: 2019-08-27 | Discharge status: Absent without leave | Payer: 59

## 2019-08-27 ENCOUNTER — Other Ambulatory Visit: Payer: Self-pay

## 2019-08-27 ENCOUNTER — Encounter: Payer: Self-pay | Admitting: General Practice

## 2019-08-27 ENCOUNTER — Encounter: Payer: Self-pay | Admitting: Obstetrics and Gynecology

## 2019-08-27 ENCOUNTER — Ambulatory Visit (INDEPENDENT_AMBULATORY_CARE_PROVIDER_SITE_OTHER): Payer: 59 | Admitting: Obstetrics and Gynecology

## 2019-08-27 VITALS — BP 123/76 | HR 74 | Wt 245.0 lb

## 2019-08-27 DIAGNOSIS — Z6841 Body Mass Index (BMI) 40.0 and over, adult: Secondary | ICD-10-CM | POA: Insufficient documentation

## 2019-08-27 DIAGNOSIS — O10919 Unspecified pre-existing hypertension complicating pregnancy, unspecified trimester: Secondary | ICD-10-CM

## 2019-08-27 DIAGNOSIS — Q513 Bicornate uterus: Secondary | ICD-10-CM

## 2019-08-27 DIAGNOSIS — N96 Recurrent pregnancy loss: Secondary | ICD-10-CM

## 2019-08-27 DIAGNOSIS — Z794 Long term (current) use of insulin: Secondary | ICD-10-CM

## 2019-08-27 DIAGNOSIS — B009 Herpesviral infection, unspecified: Secondary | ICD-10-CM

## 2019-08-27 DIAGNOSIS — R809 Proteinuria, unspecified: Secondary | ICD-10-CM

## 2019-08-27 DIAGNOSIS — O99210 Obesity complicating pregnancy, unspecified trimester: Secondary | ICD-10-CM

## 2019-08-27 DIAGNOSIS — R8271 Bacteriuria: Secondary | ICD-10-CM

## 2019-08-27 DIAGNOSIS — O2621 Pregnancy care for patient with recurrent pregnancy loss, first trimester: Secondary | ICD-10-CM

## 2019-08-27 DIAGNOSIS — Z7901 Long term (current) use of anticoagulants: Secondary | ICD-10-CM | POA: Insufficient documentation

## 2019-08-27 DIAGNOSIS — O0990 Supervision of high risk pregnancy, unspecified, unspecified trimester: Secondary | ICD-10-CM

## 2019-08-27 DIAGNOSIS — O99211 Obesity complicating pregnancy, first trimester: Secondary | ICD-10-CM

## 2019-08-27 DIAGNOSIS — O24311 Unspecified pre-existing diabetes mellitus in pregnancy, first trimester: Secondary | ICD-10-CM

## 2019-08-27 DIAGNOSIS — O10911 Unspecified pre-existing hypertension complicating pregnancy, first trimester: Secondary | ICD-10-CM

## 2019-08-27 DIAGNOSIS — E1129 Type 2 diabetes mellitus with other diabetic kidney complication: Secondary | ICD-10-CM

## 2019-08-27 LAB — POCT URINALYSIS DIP (DEVICE)
Bilirubin Urine: NEGATIVE
Glucose, UA: NEGATIVE mg/dL
Ketones, ur: NEGATIVE mg/dL
Nitrite: NEGATIVE
Protein, ur: 30 mg/dL — AB
Specific Gravity, Urine: 1.02 (ref 1.005–1.030)
Urobilinogen, UA: 0.2 mg/dL (ref 0.0–1.0)
pH: 6.5 (ref 5.0–8.0)

## 2019-08-27 MED ORDER — ASPIRIN EC 81 MG PO TBEC
81.0000 mg | DELAYED_RELEASE_TABLET | Freq: Every day | ORAL | 2 refills | Status: DC
Start: 2019-09-10 — End: 2019-11-11

## 2019-08-27 MED ORDER — BONJESTA 20-20 MG PO TBCR: 1.0000 | EXTENDED_RELEASE_TABLET | Freq: Every day | ORAL | 0 refills | Status: DC

## 2019-08-27 NOTE — Progress Notes (Signed)
Patient  Requests antibiotic Rx for GBS UTI & reports a lot of nausea/vomiting since last appt- needs Rx

## 2019-08-27 NOTE — Progress Notes (Signed)
Prenatal Visit Note Date: 08/27/2019 Clinic: [facility]  Subjective:  Nina Phillips is a 29 y.o. G5P0040 at [redacted]w[redacted]d being seen today for ongoing prenatal care.  She is currently monitored for the following issues for this high-risk pregnancy and has HSV-2 infection; Bicornuate uterus; History of recurrent miscarriages; GBS bacteriuria; Type 2 diabetes mellitus with microalbuminuria, with long-term current use of insulin (HCC); Supervision of high risk pregnancy, antepartum; Pre-existing diabetes mellitus affecting pregnancy in first trimester, antepartum; BMI 40.0-44.9, adult (HCC); Obesity in pregnancy; and Chronic hypertension during pregnancy, antepartum on their problem list.  Patient reports morning sickness Contractions: Not present. Vag. Bleeding: Scant.  Movement: Absent. Denies leaking of fluid.   The following portions of the patient's history were reviewed and updated as appropriate: allergies, current medications, past family history, past medical history, past social history, past surgical history and problem list. Problem list updated.  Objective:   Vitals:   08/27/19 1034  BP: 123/76  Pulse: 74  Weight: (!) 245 lb (111.1 kg)    Fetal Status: Fetal Heart Rate (bpm): 175   Movement: Absent     General:  Alert, oriented and cooperative. Patient is in no acute distress.  Skin: Skin is warm and dry. No rash noted.   Cardiovascular: Normal heart rate noted  Respiratory: Normal respiratory effort, no problems with respiration noted  Abdomen: Soft, gravid, appropriate for gestational age. Pain/Pressure: Absent     Pelvic:  Cervical exam deferred        Extremities: Normal range of motion.  Edema: None  Mental Status: Normal mood and affect. Normal behavior. Normal judgment and thought content.   Urinalysis:      Assessment and Plan:  Pregnancy: G5P0040 at [redacted]w[redacted]d  1. Supervision of high risk pregnancy, antepartum Routine care. Genetics  today. Pt was seen by Dr. April Manson at [facility] here in [place]. We don't have any records so request made w/ front desk to try and get records. She said that she is on heparin one thousand bid, which she said she put her on b/c she had some bleeding at higher doses; currently she only has occasional spotting. Will see if we can pin down in records plan for anti-coagulation and if can do lovenox.   Start low dose ASA in two weeks - CBC  2. Bicornuate uterus Confirmed with Dr. Jeannie Fend and seen on our prior ultrasounds  3. BMI 40.0-44.9, adult (HCC)  4. Obesity in pregnancy  5. Chronic hypertension during pregnancy, antepartum Based on prior BPs. No change in plan of care. D/w pt  6. Pre-existing diabetes mellitus affecting pregnancy in first trimester, antepartum On metformin 1000 w/ breakfast and 1000 w/ lunch and lantus 20 qhs. Gives normal CBG numbers. Followed by Endo.  7. Type 2 diabetes mellitus with microalbuminuria, with long-term current use of insulin (HCC)  8. GBS bacteriuria Too low to treat. tx in labor  9. History of recurrent miscarriages  10. HSV-2 infection ppx at 34-36wks  Preterm labor symptoms and general obstetric precautions including but not limited to vaginal bleeding, contractions, leaking of fluid and fetal movement were reviewed in detail with the patient. Please refer to After Visit Summary for other counseling recommendations.  Return in about 2 weeks (around 09/10/2019) for high risk, in person.   [place] Bing, MD

## 2019-08-28 LAB — CBC
Hematocrit: 34.9 % (ref 34.0–46.6)
Hemoglobin: 11.2 g/dL (ref 11.1–15.9)
MCH: 23.9 pg — ABNORMAL LOW (ref 26.6–33.0)
MCHC: 32.1 g/dL (ref 31.5–35.7)
MCV: 75 fL — ABNORMAL LOW (ref 79–97)
Platelets: 437 10*3/uL (ref 150–450)
RBC: 4.68 x10E6/uL (ref 3.77–5.28)
RDW: 15.9 % — ABNORMAL HIGH (ref 11.7–15.4)
WBC: 10.2 10*3/uL (ref 3.4–10.8)

## 2019-08-29 ENCOUNTER — Other Ambulatory Visit: Payer: Self-pay | Admitting: General Practice

## 2019-08-31 ENCOUNTER — Telehealth: Payer: Self-pay | Admitting: *Deleted

## 2019-08-31 NOTE — Telephone Encounter (Addendum)
Pt left VM message stating that she needs the Rx signed from [employer] so that she will be able to get her breast pump.   8/9 1500 MyChart message sent to pt explaining that the process for signing the prescription can take 1-2 weeks. The Rx will be signed and faxed back to [employer].

## 2019-09-03 ENCOUNTER — Encounter: Payer: Self-pay | Admitting: *Deleted

## 2019-09-04 NOTE — Telephone Encounter (Signed)
Called pt to discuss genetic screening results. Explained Horizon results were negative. Explained Panorama results showed "inconclusive fetal DNA," which means lab will need to be redrawn at next visit. Pt expresses understand.

## 2019-09-10 ENCOUNTER — Encounter: Payer: 59 | Admitting: Family Medicine

## 2019-09-13 ENCOUNTER — Encounter: Payer: Self-pay | Admitting: General Practice

## 2019-09-17 ENCOUNTER — Ambulatory Visit (INDEPENDENT_AMBULATORY_CARE_PROVIDER_SITE_OTHER): Payer: 59 | Admitting: Obstetrics and Gynecology

## 2019-09-17 ENCOUNTER — Other Ambulatory Visit: Payer: Self-pay

## 2019-09-17 ENCOUNTER — Encounter: Payer: Self-pay | Admitting: Obstetrics and Gynecology

## 2019-09-17 VITALS — BP 130/82 | HR 92 | Wt 242.0 lb

## 2019-09-17 DIAGNOSIS — B009 Herpesviral infection, unspecified: Secondary | ICD-10-CM

## 2019-09-17 DIAGNOSIS — Z7901 Long term (current) use of anticoagulants: Secondary | ICD-10-CM

## 2019-09-17 DIAGNOSIS — N96 Recurrent pregnancy loss: Secondary | ICD-10-CM

## 2019-09-17 DIAGNOSIS — Z6841 Body Mass Index (BMI) 40.0 and over, adult: Secondary | ICD-10-CM

## 2019-09-17 DIAGNOSIS — O0990 Supervision of high risk pregnancy, unspecified, unspecified trimester: Secondary | ICD-10-CM

## 2019-09-17 DIAGNOSIS — Z3A13 13 weeks gestation of pregnancy: Secondary | ICD-10-CM

## 2019-09-17 DIAGNOSIS — O10919 Unspecified pre-existing hypertension complicating pregnancy, unspecified trimester: Secondary | ICD-10-CM

## 2019-09-17 DIAGNOSIS — O24311 Unspecified pre-existing diabetes mellitus in pregnancy, first trimester: Secondary | ICD-10-CM

## 2019-09-17 DIAGNOSIS — R8271 Bacteriuria: Secondary | ICD-10-CM

## 2019-09-17 DIAGNOSIS — O99210 Obesity complicating pregnancy, unspecified trimester: Secondary | ICD-10-CM

## 2019-09-17 DIAGNOSIS — Q513 Bicornate uterus: Secondary | ICD-10-CM

## 2019-09-17 MED ORDER — DOXYLAMINE-PYRIDOXINE 10-10 MG PO TBEC: 1.0000 | DELAYED_RELEASE_TABLET | Freq: Every day | ORAL | 3 refills | Status: DC

## 2019-09-17 NOTE — Progress Notes (Signed)
Prenatal Visit Note Date: 09/17/2019 Clinic: [facility]  Subjective:  Nina Phillips is a 29 y.o. G5P0040 at [redacted]w[redacted]d being seen today for ongoing prenatal care.  She is currently monitored for the following issues for this high-risk pregnancy and has HSV-2 infection; Bicornuate uterus; History of recurrent miscarriages; GBS bacteriuria; Type 2 diabetes mellitus with microalbuminuria, with long-term current use of insulin (HCC); Supervision of high risk pregnancy, antepartum; Pre-existing diabetes mellitus affecting pregnancy in first trimester, antepartum; BMI 40.0-44.9, adult (HCC); Obesity in pregnancy; Chronic hypertension during pregnancy, antepartum; and Anticoagulant long-term use on their problem list.  Patient reports no complaints.   Contractions: Not present. Vag. Bleeding: None.  Movement: Absent. Denies leaking of fluid.   The following portions of the patient's history were reviewed and updated as appropriate: allergies, current medications, past family history, past medical history, past social history, past surgical history and problem list. Problem list updated.  Objective:   Vitals:   09/17/19 1112  BP: 130/82  Pulse: 92  Weight: 242 lb (109.8 kg)    Fetal Status: Fetal Heart Rate (bpm): 156   Movement: Absent     General:  Alert, oriented and cooperative. Patient is in no acute distress.  Skin: Skin is warm and dry. No rash noted.   Cardiovascular: Normal heart rate noted  Respiratory: Normal respiratory effort, no problems with respiration noted  Abdomen: Soft, gravid, appropriate for gestational age. Pain/Pressure: Absent     Pelvic:  Cervical exam deferred        Extremities: Normal range of motion.  Edema: None  Mental Status: Normal mood and affect. Normal behavior. Normal judgment and thought content.   Urinalysis:      Assessment and Plan:  Pregnancy: G5P0040 at [redacted]w[redacted]d  1. Supervision of high risk pregnancy, antepartum Confirms low  dose asa. Insufficient on prior panorama. Will re-draw today Continued daily nausea. bonjesta not at the pharmacy. diclegis sent in and pt to let us know if it doesn't come. Offer afp nv. Already scheduled for anatomy u/s - Genetic Screening  2. BMI 40.0-44.9, adult (HCC)  3. Obesity in pregnancy  4. Chronic hypertension during pregnancy, antepartum Doing well on no meds  5. Bicornuate uterus Close f/u during pregnancy. At risk for PTL/PTB  6. Pre-existing diabetes mellitus affecting pregnancy in first trimester, antepartum Normal CBG values on metforin 1000 bid and lantus 20 qhs.  Needs fetal echo at 22-26wks  7. Anticoagulant long-term use Pt states she needs a refill on heparin. See prior note. No records from [facility]. Office is in [place]. Pt asked to go by office and get records and bring them for review.   8. GBS bacteriuria Too low to tx  9. HSV-2 infection H/o. ppx at 32-34wks  10. History of recurrent miscarriages See above  Preterm labor symptoms and general obstetric precautions including but not limited to vaginal bleeding, contractions, leaking of fluid and fetal movement were reviewed in detail with the patient. Please refer to After Visit Summary for other counseling recommendations.  Return in about 3 weeks (around 10/08/2019) for high risk, in person.   [place] Bing, MD

## 2019-09-17 NOTE — Progress Notes (Signed)
Will do panorama today. Needs a refill on heparin

## 2019-09-21 ENCOUNTER — Other Ambulatory Visit: Payer: Self-pay | Admitting: Internal Medicine

## 2019-09-21 NOTE — Telephone Encounter (Signed)
Patient called to check on refill status.

## 2019-09-27 ENCOUNTER — Encounter: Payer: Self-pay | Admitting: *Deleted

## 2019-09-27 ENCOUNTER — Other Ambulatory Visit: Payer: Self-pay

## 2019-09-27 ENCOUNTER — Telehealth (INDEPENDENT_AMBULATORY_CARE_PROVIDER_SITE_OTHER): Payer: 59

## 2019-09-27 DIAGNOSIS — N96 Recurrent pregnancy loss: Secondary | ICD-10-CM

## 2019-09-27 DIAGNOSIS — O0990 Supervision of high risk pregnancy, unspecified, unspecified trimester: Secondary | ICD-10-CM

## 2019-09-27 MED ORDER — HEPARIN SODIUM (PORCINE) 10000 UNIT/ML IJ SOLN: 1000.0000 [IU] | Freq: Two times a day (BID) | INTRAMUSCULAR | 1 refills | Status: DC

## 2019-09-27 MED ORDER — HEPARIN SODIUM (PORCINE) 5000 UNIT/ML IJ SOLN: 5000.0000 [IU] | Freq: Two times a day (BID) | INTRAMUSCULAR | 1 refills | Status: DC

## 2019-09-27 MED ORDER — HEPARIN SODIUM (PORCINE) 5000 UNIT/ML IJ SOLN: 1000.0000 [IU] | Freq: Two times a day (BID) | INTRAMUSCULAR | 1 refills | Status: DC

## 2019-09-27 NOTE — Telephone Encounter (Signed)
Call received from [facility] RN who states initial prescription was for Heparin 5,000 u BID SQ. Pt was instructed to give full 5,000 u vial or 1 mL of Heparin. Heparin ordered due to lab result: PIA-1 4g 5g. Records requested. RN states their office has a signed release of information and RN will notify front office staff to fax records. [facility] office staff called to confirm fax number and stated records will be sent in the next 20 minutes.  Heparin 5,000 units every 12 hours order sent to pt's pharmacy. Vial to be dispensed is 5,000 unit/mL. Pharmacy called to confirm receipt; due to packaging 25 day supply will be given at a time. Called pt to confirm she has been taking the correct dosage and a new rx has been sent.

## 2019-09-27 NOTE — Telephone Encounter (Signed)
Received a call from front office. [facility] requesting a change of 95621 units of Heparin to 5000 units. Change of dosage permitted per Dr. Alysia Penna.  Ladona Ridgel, RN

## 2019-09-27 NOTE — Telephone Encounter (Signed)
Patient expressed concern via MyChart message about change in dosage of Heparin after being contacted by [facility]. Heparin 1,000 u was ordered this AM; 2 vial sizes ordered.   Heparin originally ordered by [facility]. Called [place] location; VM left for [employer], RN with direct phone number. Called again; spoke with clinical staff person who states original prescription was for 5,000 u vial and 1 cc syringe; cannot confirm if dosage was the full vial amount. Clinical person states they will ask Crystal, RN to return my phone call.   Called pt to clarify information given via MyChart. Pt reports that she has been given 5,000 u/1 mL vials since initial prescription. Reports being taught in person at [facility] how to draw up medication and inject. Reports she was told to fill entire 1 cc syringe, which is the full amount in a vial. Reports taking at 0900 and 2100. Pt reports no prescription change until today. Pt reports having 10 vials at home.  [facility] and spoke with pharmacist [facility] who states they were given verbal confirmation from [place], RN to fill 5,000 u/1 mL vial this AM and to discontinue 10,000 u/1 mL vial prescription; prescription dosage was 1,000 u every 12 hours. Pharmacist reports being concerned this AM due to the change in dosage. Pharmacist reports original prescription sent from office of April Manson, MD was for 5,000 u/1 mL vial; 1 mL every 12 hours. Explained to pharmacist I am waiting for confirmation from [facility] and would like Heparin rx discontinued until further confirmation.   Awaiting phone call from [facility].

## 2019-09-28 ENCOUNTER — Telehealth (INDEPENDENT_AMBULATORY_CARE_PROVIDER_SITE_OTHER): Payer: 59 | Admitting: Lactation Services

## 2019-09-28 ENCOUNTER — Ambulatory Visit (INDEPENDENT_AMBULATORY_CARE_PROVIDER_SITE_OTHER): Payer: 59 | Admitting: Internal Medicine

## 2019-09-28 ENCOUNTER — Encounter: Payer: Self-pay | Admitting: Obstetrics and Gynecology

## 2019-09-28 ENCOUNTER — Other Ambulatory Visit: Payer: Self-pay

## 2019-09-28 ENCOUNTER — Encounter: Payer: Self-pay | Admitting: Internal Medicine

## 2019-09-28 VITALS — BP 140/80 | HR 86 | Ht 64.0 in | Wt 242.0 lb

## 2019-09-28 DIAGNOSIS — O0990 Supervision of high risk pregnancy, unspecified, unspecified trimester: Secondary | ICD-10-CM

## 2019-09-28 DIAGNOSIS — O24311 Unspecified pre-existing diabetes mellitus in pregnancy, first trimester: Secondary | ICD-10-CM | POA: Diagnosis not present

## 2019-09-28 MED ORDER — LANTUS SOLOSTAR 100 UNIT/ML ~~LOC~~ SOPN
20.0000 [IU] | PEN_INJECTOR | Freq: Every day | SUBCUTANEOUS | 6 refills | Status: DC
Start: 2019-09-28 — End: 2019-10-31

## 2019-09-28 MED ORDER — METFORMIN HCL 500 MG PO TABS: 500.0000 mg | ORAL_TABLET | Freq: Two times a day (BID) | ORAL | 3 refills | Status: DC

## 2019-09-28 NOTE — Patient Instructions (Signed)
-   Keep up the GOOD WORK! You are doing AWESOME    - Continue Metformin 500 mg, twice daily  - Continue  Lantus 20 units daily    TIMING  BLOOD SUGAR GOALS  FASTING (before breakfast)  60 - 95  BEFORE MEALS LESS THAN 100  2 hours AFTER MEAL LESS THAN 120         HOW TO TREAT LOW BLOOD SUGARS (Blood sugar LESS THAN 60 MG/DL)  Please follow the RULE OF 15 for the treatment of hypoglycemia treatment (when your (blood sugars are less than 60 mg/dL)    STEP 1: Take 15 grams of carbohydrates when your blood sugar is low, which includes:   3-4 GLUCOSE TABS  OR  3-4 OZ OF JUICE OR REGULAR SODA OR  ONE TUBE OF GLUCOSE GEL     STEP 2: RECHECK blood sugar in 15 MINUTES STEP 3: If your blood sugar is still low at the 15 minute recheck --> then, go back to STEP 1 and treat AGAIN with another 15 grams of carbohydrates.

## 2019-09-28 NOTE — Progress Notes (Signed)
Name: Nina Phillips  Age/ Sex: 29 y.o., female   MRN/ DOB: [id number], 1990-12-19     PCP: Arvilla Market, DO   Reason for Endocrinology Evaluation: Type 2 Diabetes Mellitus  Initial Endocrine Consultative Visit:  09/08/2018    PATIENT IDENTIFIER: [name] is a 29 y.o. female with a past medical history of T2DM. The patient has followed with Endocrinology clinic since 09/08/2018 for consultative assistance with management of her diabetes.  DIABETIC HISTORY:  [name] was diagnosed with T2 DM in 06/2018. Her hemoglobin A1c has ranged from 10.6% on her initial diagnosis.  On her initial visit to our clinic she had an A1c of 10.3%, she was on Lantus only, we started Metformin. Invokana caused yeast infection .     She had ~ 5 miscarriages in 2020, the last one was 07/2018 Lives with boyfriend and his grandmother  SUBJECTIVE:   During the last visit (08/13/2019): A1c 7.7 %. Lantus increased, continued metformin and started Invokana     Today (09/28/2019): [name] is here for a follow up on diabetes.   She checks her blood sugars 3-4 times daily. The patient has not had hypoglycemic episodes since the last clinic visit.  She is currently 15 weeks of gestation Nausea has been improving      HOME DIABETES REGIMEN:  Metformin 1000 tablet, half a tablet twice a day  Lantus 20 units daily       METER DOWNLOAD SUMMARY: 8/5- 09/28/2019 Fingerstick Blood Glucose Tests = 65 Average Number Tests/Day = 2.2 Overall Mean FS Glucose = 89 Standard Deviation = 15.4  BG Ranges: Low = 62 High = 138   Hypoglycemic Events/30 Days: BG < 50 = 0 Episodes of symptomatic severe hypoglycemia = 0       DIABETIC COMPLICATIONS: Microvascular complications:    Denies: CKD, Neuropathy, retinopathy    Macrovascular complications:    Denies: CAD, CVA, PVD   HISTORY:  Past Medical History:  Past Medical History:  Diagnosis Date  . Abnormal uterine bleeding  (AUB) 07/09/2019  . Anemia   . Anxiety   . Bicornate uterus   . Depression    seeing a therapist since 3rd miscarriage, helping, doing good.  . Diabetes mellitus without complication (HCC)    Type 2  . Gout   . History of miscarriage   . Ovarian cyst   . Trichomonas infection 08/24/2018   Treated 08/17/18   Past Surgical History:  Past Surgical History:  Procedure Laterality Date  . DILATION AND CURETTAGE OF UTERUS    . WISDOM TOOTH EXTRACTION      Social History:  reports that she has quit smoking. She has never used smokeless tobacco. She reports that she does not drink alcohol and does not use drugs. Family History:  Family History  Problem Relation Age of Onset  . Cancer Other   . Hypertension Other   . Diabetes Other   . Hypertension Mother   . Gout Mother   . Diabetes Father   . Hypertension Father   . Stroke Father   . Hypertension Maternal Grandfather   . Heart attack Maternal Grandfather   . Stroke Paternal Grandmother   . Hypertension Maternal Aunt   . Cancer Maternal Grandmother        breast  . Diabetes Paternal Grandfather      HOME MEDICATIONS: Allergies as of 09/28/2019      Reactions   Apple Fruit Extract Hives   Banana Hives  Other Hives   Walnuts   Shellfish Allergy Hives   Tomato Hives   Watermelon [citrullus Vulgaris] Hives   Bactrim [sulfamethoxazole-trimethoprim] Hives, Rash   Sulfa Antibiotics Hives, Rash      Medication List       Accurate as of September 28, 2019  3:32 PM. If you have any questions, ask your nurse or doctor.        STOP taking these medications   Dexcom G6 Receiver Devi Stopped by: Scarlette Shorts, MD   Dexcom G6 Sensor Misc Stopped by: Scarlette Shorts, MD   Dexcom G6 Transmitter Misc Stopped by: Scarlette Shorts, MD   NovoLOG FlexPen 100 UNIT/ML FlexPen Generic drug: insulin aspart Stopped by: Scarlette Shorts, MD     TAKE these medications   Accu-Chek FastClix Lancets Misc USE TO  CHECK BLOOD SUGAR FOUR TIMES DAILY AND AS NEEDED   Accu-Chek Guide test strip Generic drug: glucose blood USE 1 STRIP 4 TIMES DAILY AND AS NEEDED   aspirin EC 81 MG tablet Take 1 tablet (81 mg total) by mouth daily.   Doxylamine-Pyridoxine 10-10 MG Tbec Take 1 tablet by mouth daily. 2 tabs qhs. Add 1 with breakfast and then 1 with lunch prn   heparin 5000 UNIT/ML injection Inject 1 mL (5,000 Units total) into the skin every 12 (twelve) hours.   Insulin Pen Needle 32G X 4 MM Misc 1 Device by Does not apply route in the morning, at noon, in the evening, and at bedtime.   Lantus SoloStar 100 UNIT/ML Solostar Pen Generic drug: insulin glargine Inject 20 Units into the skin daily.   metFORMIN 1000 MG tablet Commonly known as: GLUCOPHAGE Take one half tablet by mouth twice daily. What changed: additional instructions   Prenatal Vitamin 27-0.8 MG Tabs Take 1 tablet by mouth daily.        OBJECTIVE:   Vital Signs: BP 140/80 (BP Location: Left Arm, Patient Position: Sitting, Cuff Size: Large)   Pulse 86   Ht 5\' 4"  (1.626 m)   Wt 242 lb (109.8 kg)   LMP 06/15/2019   SpO2 98%   BMI 41.54 kg/m   Wt Readings from Last 3 Encounters:  09/28/19 242 lb (109.8 kg)  09/17/19 242 lb (109.8 kg)  08/27/19 (!) 245 lb (111.1 kg)     Exam: General: Pt appears well and is in NAD  Lungs: Clear with good BS bilat with no rales, rhonchi, or wheezes  Heart: RRR with normal S1 and S2 and no gallops; no murmurs; no rub  Abdomen: Normoactive bowel sounds, soft, nontender, without masses or organomegaly palpable  Extremities: No pretibial edema  Skin: Normal texture and temperature to palpation.   Neuro: MS is good with appropriate affect, pt is alert and Ox3    DM foot exam: 06/22/2019    The skin of the feet is intact without sores or ulcerations. The pedal pulses are 2+ on right and 2+ on left. The sensation is intact to a screening 5.07, 10 gram monofilament bilaterally           DATA REVIEWED:  Lab Results  Component Value Date   HGBA1C 7.7 (H) 08/06/2019   HGBA1C 9.1 (A) 06/22/2019   HGBA1C 10.3 (H) 08/09/2018   Lab Results  Component Value Date   MICROALBUR 31.3 06/22/2019   LDLCALC 143 (H) 06/22/2019   CREATININE 0.89 08/06/2019    ASSESSMENT / PLAN / RECOMMENDATIONS:   1) Type 2 Diabetes Mellitus, Improving control, With microalbuminuria  -  Most recent A1c of 7.7 %. Goal A1c < 6.5 %.    -I have praised the pt on improved glycemic control , in review of her meter download, all BG's are within goal except for one time in the past month, where she had a 138. - We have attempted to prescribe a CGM  but her insurance won't cover  - Pt to notify us should her Bg's remain above pregnancy goals    TIMING  BLOOD SUGAR GOALS  FASTING (before breakfast)  60 - 95  BEFORE MEALS LESS THAN 100  2 hours AFTER MEAL LESS THAN 120    MEDICATIONS: - Continue Metformin 500 mg, twice a day - Continue  Lantus 20 units daily    EDUCATION / INSTRUCTIONS:  BG monitoring instructions: Patient is instructed to check her blood sugars 6 imes a day, preprandial and 2 hours postprandial   Call [facility] if: BG persistently < 60 , > 120  . I reviewed the Rule of 15 for the treatment of hypoglycemia in detail with the patient. Literature supplied.     2) Diabetic complications:   Eye: Does not have known diabetic retinopathy.   Neuro/ Feet: Does not have known diabetic peripheral neuropathy.  Renal: Patient does not have known baseline CKD. But has microalbuminuria  F/U in 2 months    Signed electronically by: Lyndle Herrlich, MD  [facility]  [facility] [address] [place] [place], [place] [address] Phone: [phone] FAX: [phone]   CC: Arvilla Market, DO [address] [place] [address] Phone: [phone]  Fax: [phone]  Return to Endocrinology clinic  as below: Future Appointments  Date Time Provider Department Center  10/10/2019  9:55 AM Warden Fillers, MD [facility]  11/01/2019 10:45 AM [facility] NURSE [facility]  11/01/2019 11:00 AM [facility] US1 [facility]

## 2019-09-28 NOTE — Progress Notes (Signed)
OB Note Request made to K. Rassette to schedule patient for  Genetic counseling visit for sometime in the next 7-10 days for her insufficient fetal fx on panorama x 2.  Cornelia Copa MD Attending Center for [employer] (Faculty Practice) 09/28/2019 Time: 2130

## 2019-09-28 NOTE — Telephone Encounter (Signed)
Called patient to inform her of Insufficient Fetal DNA on 2nd Panorama and that the Provider will discuss drawing a Quad Screen at her next OB visit. Reviewed with patient that this does not mean anything is wrong with baby. Reviewed that sex of infant will not be determined until anatomy US between 18-20 weeks. Patient voiced understanding. Patient with no questions or concerns.

## 2019-10-02 ENCOUNTER — Encounter: Payer: Self-pay | Admitting: General Practice

## 2019-10-03 ENCOUNTER — Other Ambulatory Visit: Payer: Self-pay | Admitting: *Deleted

## 2019-10-03 DIAGNOSIS — O285 Abnormal chromosomal and genetic finding on antenatal screening of mother: Secondary | ICD-10-CM

## 2019-10-08 ENCOUNTER — Encounter: Payer: Self-pay | Admitting: Obstetrics and Gynecology

## 2019-10-08 DIAGNOSIS — O285 Abnormal chromosomal and genetic finding on antenatal screening of mother: Secondary | ICD-10-CM | POA: Insufficient documentation

## 2019-10-10 ENCOUNTER — Encounter: Payer: Self-pay | Admitting: Obstetrics and Gynecology

## 2019-10-10 ENCOUNTER — Ambulatory Visit (INDEPENDENT_AMBULATORY_CARE_PROVIDER_SITE_OTHER): Payer: 59 | Admitting: Obstetrics and Gynecology

## 2019-10-10 ENCOUNTER — Other Ambulatory Visit: Payer: Self-pay

## 2019-10-10 VITALS — BP 131/83 | HR 85 | Wt 246.0 lb

## 2019-10-10 DIAGNOSIS — Z6841 Body Mass Index (BMI) 40.0 and over, adult: Secondary | ICD-10-CM

## 2019-10-10 DIAGNOSIS — R8271 Bacteriuria: Secondary | ICD-10-CM

## 2019-10-10 DIAGNOSIS — Q513 Bicornate uterus: Secondary | ICD-10-CM

## 2019-10-10 DIAGNOSIS — R809 Proteinuria, unspecified: Secondary | ICD-10-CM

## 2019-10-10 DIAGNOSIS — O10919 Unspecified pre-existing hypertension complicating pregnancy, unspecified trimester: Secondary | ICD-10-CM | POA: Diagnosis not present

## 2019-10-10 DIAGNOSIS — Z23 Encounter for immunization: Secondary | ICD-10-CM

## 2019-10-10 DIAGNOSIS — O0990 Supervision of high risk pregnancy, unspecified, unspecified trimester: Secondary | ICD-10-CM | POA: Diagnosis not present

## 2019-10-10 DIAGNOSIS — Z7901 Long term (current) use of anticoagulants: Secondary | ICD-10-CM

## 2019-10-10 DIAGNOSIS — E1129 Type 2 diabetes mellitus with other diabetic kidney complication: Secondary | ICD-10-CM

## 2019-10-10 DIAGNOSIS — Z3A16 16 weeks gestation of pregnancy: Secondary | ICD-10-CM

## 2019-10-10 DIAGNOSIS — N96 Recurrent pregnancy loss: Secondary | ICD-10-CM

## 2019-10-10 DIAGNOSIS — Z794 Long term (current) use of insulin: Secondary | ICD-10-CM

## 2019-10-10 DIAGNOSIS — B009 Herpesviral infection, unspecified: Secondary | ICD-10-CM

## 2019-10-10 LAB — POCT URINALYSIS DIP (DEVICE)
Bilirubin Urine: NEGATIVE
Glucose, UA: NEGATIVE mg/dL
Hgb urine dipstick: NEGATIVE
Ketones, ur: NEGATIVE mg/dL
Nitrite: NEGATIVE
Protein, ur: 30 mg/dL — AB
Specific Gravity, Urine: 1.015 (ref 1.005–1.030)
Urobilinogen, UA: 1 mg/dL (ref 0.0–1.0)
pH: 6 (ref 5.0–8.0)

## 2019-10-10 NOTE — Progress Notes (Signed)
   PRENATAL VISIT NOTE  Subjective:  Nina Phillips is a 29 y.o. G5P0040 at [redacted]w[redacted]d being seen today for ongoing prenatal care.  She is currently monitored for the following issues for this high-risk pregnancy and has HSV-2 infection; Bicornuate uterus; History of recurrent miscarriages; GBS bacteriuria; Type 2 diabetes mellitus with microalbuminuria, with long-term current use of insulin (HCC); Supervision of high risk pregnancy, antepartum; Pre-existing diabetes mellitus affecting pregnancy in first trimester, antepartum; BMI 40.0-44.9, adult (HCC); Obesity in pregnancy; Chronic hypertension during pregnancy, antepartum; Anticoagulant long-term use; Insufficient fetal fraction x 2 on Panorama; and [redacted] weeks gestation of pregnancy on their problem list.  Patient doing well with no acute concerns today. She reports no complaints.  Contractions: Not present. Vag. Bleeding: None.  Movement: Absent. Denies leaking of fluid.   Reviewed the chart with patient and her mother.  Pt is compliant with her current diabetes  The following portions of the patient's history were reviewed and updated as appropriate: allergies, current medications, past family history, past medical history, past social history, past surgical history and problem list. Problem list updated.  Objective:   Vitals:   10/10/19 1019  BP: 131/83  Pulse: 85  Weight: 246 lb (111.6 kg)    Fetal Status: Fetal Heart Rate (bpm): 154 Fundal Height: 16 cm Movement: Absent     General:  Alert, oriented and cooperative. Patient is in no acute distress.  Skin: Skin is warm and dry. No rash noted.   Cardiovascular: Normal heart rate noted  Respiratory: Normal respiratory effort, no problems with respiration noted  Abdomen: Soft, gravid, appropriate for gestational age.  Pain/Pressure: Absent     Pelvic: Cervical exam deferred        Extremities: Normal range of motion.  Edema: None  Mental Status:  Normal mood and affect. Normal behavior.  Normal judgment and thought content.   Assessment and Plan:  Pregnancy: G5P0040 at [redacted]w[redacted]d  1. Chronic hypertension during pregnancy, antepartum Bp upper limit of normal  2. Type 2 diabetes mellitus with microalbuminuria, with long-term current use of insulin (HCC) Good control per pt and from endocrinology note, she remains on metformin 500 mg BID and lantus 20 units daily, pt advised to bring in blood sugars  3. Bicornuate uterus Reevaluate with anatomy u/s  4. HSV-2 infection Valtrex at 36 weeks  5. History of recurrent miscarriages Pt remain on prophylactic heparin  6. GBS bacteriuria Treat in labor  7. Supervision of high risk pregnancy, antepartum Pt has anatomy scan with MFM on 11/01/19 - AFP, Serum, Open Spina Bifida - Flu Vaccine QUAD 36+ mos IM - Genetic Screening  8. BMI 40.0-44.9, adult (HCC)   9. Anticoagulant long-term use   10. [redacted] weeks gestation of pregnancy   Preterm labor symptoms and general obstetric precautions including but not limited to vaginal bleeding, contractions, leaking of fluid and fetal movement were reviewed in detail with the patient.  Please refer to After Visit Summary for other counseling recommendations.   Return in about 3 weeks (around 10/31/2019) for [facility], in person.   Mariel Aloe, MD

## 2019-10-10 NOTE — Patient Instructions (Signed)
Type 1 or Type 2 Diabetes Mellitus During Pregnancy, Self Care When you have type 1 or type 2 diabetes (diabetes mellitus), you must keep your blood sugar (glucose) in a healthy range. You can do this with:  Nutrition.  Exercise.  Lifestyle changes.  Insulin or medicines, if needed.  Support from your doctors and others. If diabetes is treated, it is unlikely to cause problems for the mother or baby. If it is not treated, it may cause problems that can be harmful to the mother and baby. How to stay aware of blood sugar   Check your blood sugar every day, as often as told.  Call your doctor if your blood sugar is above your goal numbers for 2 tests in a row.  Have your A1c (hemoglobin A1c) level checked at least two times a year. Have it checked more often if your doctor tells you to do that. Your doctor will set personal treatment goals for you. In general, you should have these blood sugar levels:  After not eating for a long time (fasting): 95 mg/dL (5.3 mmol/L).  After meals (postprandial): ? One hour after a meal: at or below 140 mg/dL (7.8 mmol/L). ? Two hours after a meal: at or below 120 mg/dL (6.7 mmol/L).  A1c level: 6-6.5%. How to manage high and low blood sugar Signs of high blood sugar High blood sugar is called hyperglycemia. Know the early signs of high blood sugar. Signs may include:  Feeling: ? Thirsty. ? Hungry. ? Very tired.  Needing to pee (urinate) more than usual.  Blurry vision. Signs of low blood sugar Low blood sugar is called hypoglycemia. This is when blood sugar is at or below 70 mg/dL (3.9 mmol/L). Signs may include:  Feeling: ? Hungry. ? Worried or nervous (anxious). ? Sweaty and clammy. ? Confused. ? Dizzy. ? Sleepy. ? Sick to your stomach (nauseous).  Having: ? A fast heartbeat. ? A headache. ? A change in your vision. ? Tingling or no feeling (numbness) around your mouth, lips, or tongue. ? Jerky movements that you cannot  control (seizure).  Having trouble with: ? Moving (coordination). ? Sleeping. ? Passing out (fainting). ? Getting upset easily (irritability). Treating low blood sugar To treat low blood sugar, eat or drink something sugary right away. If you can think clearly and swallow safely, follow the 15:15 rule:  Take 15 grams of a fast-acting carb (carbohydrate). Some fast-acting carbs are: ? 1 tube of glucose gel. ? 3 sugar tablets (glucose pills). ? 6-8 pieces of hard candy. ? 4 oz (120 mL) of fruit juice. ? 4 oz (120 mL) of regular (not diet) soda.  Check your blood sugar 15 minutes after you take the carb.  If your blood sugar is still at or below 70 mg/dL (3.9 mmol/L), take 15 grams of a carb again.  If your blood sugar does not go above 70 mg/dL (3.9 mmol/L) after 3 tries, get help right away.  After your blood sugar goes back to normal, eat a meal or a snack within 1 hour. Treating very low blood sugar If your blood sugar is at or below 54 mg/dL (3 mmol/L), you have very low blood sugar (severe hypoglycemia). This is an emergency. Do not wait to see if the symptoms will go away. Get medical help right away. Call your local emergency services (911 in the [place] If you have very low blood sugar and you cannot eat or drink, you may need a glucagon shot (injection). A  family member or friend should learn how to check your blood sugar and how to give you a glucagon shot. Ask your doctor if you need to have a glucagon shot kit at home. Follow these instructions at home: Medicine  Take insulin and diabetes medicines as told.  If your doctor says you should take more or less insulin and medicines, do this exactly as told.  Do not run out of insulin or medicines. Having diabetes can put you at risk for other long-term conditions. These include heart disease and kidney disease. Your doctor may prescribe medicines to prevent these problems. Food   Make healthy food choices. These  include: ? Chicken, fish, egg whites, and beans. ? Oats, whole wheat, bulgur, brown rice, quinoa, and millet. ? Fresh fruits and vegetables. ? Low-fat dairy products. ? Nuts, avocado, olive oil, and canola oil.  Meet with a food specialist (dietitian). He or she can help you make an eating plan that is right for you.  Follow instructions from your doctor about what you cannot eat or drink.  Drink enough fluid to keep your pee (urine) pale yellow.  Eat healthy snacks between healthy meals.  Keep track of the carbs you eat. Do this by reading food labels and learning food serving sizes.  Follow your sick day plan when you cannot eat or drink normally. Make this plan with your doctor so it is ready to use. Activity  Exercise for 30 minutes or more a day during your pregnancy or as much as told by your doctor.  Talk with your doctor before you start a new exercise or activity. Your doctor may need to tell you to change: ? How much insulin or medicines you take. ? How much food you eat. Lifestyle  Do not drink alcohol.  Do not use any tobacco products, such as cigarettes, chewing tobacco, and e-cigarettes. If you need help quitting, ask your doctor.  Learn how to deal with stress. If you need help with this, ask your doctor. Body care  Stay up to date with your shots (immunizations).  Get an eye exam during your first trimester.  Check your skin and feet every day. Check for cuts, bruises, redness, blisters, or sores.  Get regular foot exams as told by your doctor.  Brush your teeth and gums two times a day. Floss one or more times a day.  Go to the dentist one or more times every 6 months.  Stay at a healthy weight during your pregnancy. General instructions  Take over-the-counter and prescription medicines only as told by your doctor.  Talk with your doctor about your risk for high blood pressure during pregnancy (preeclampsia or eclampsia).  Share your diabetes care  plan with: ? Your work or school. ? People you live with.  Check your pee for ketones: ? When you are sick. ? As told by your doctor.  Carry a card or wear jewelry that says that you have diabetes.  Keep all follow-up visits with your doctor. This is important. Questions to ask your doctor  Do I need to meet with a diabetes educator?  Where can I find a support group for people with diabetes? Where to find more information To learn more about diabetes, visit:  American Diabetes Association: www.diabetes.org  American Association of Diabetes Educators (AADE): www.diabeteseducator.org Summary  When you have type 1 or type 2 diabetes (diabetes mellitus), you must keep your blood sugar (glucose) in a healthy range.  Check your blood sugar every  day, as often as told.  Take insulin and diabetes medicines as told.  Keep all follow-up visits as told by your doctor. This is important. This information is not intended to replace advice given to you by your health care provider. Make sure you discuss any questions you have with your health care provider. Document Revised: 05/02/2018 Document Reviewed: 02/14/2015 Elsevier Patient Education  [place] of Pregnancy  The second trimester is from week 14 through week 27 (month 4 through 6). This is often the time in pregnancy that you feel your best. Often times, morning sickness has lessened or quit. You may have more energy, and you may get hungry more often. Your unborn baby is growing rapidly. At the end of the sixth month, he or she is about 9 inches long and weighs about 1 pounds. You will likely feel the baby move between 18 and 20 weeks of pregnancy. Follow these instructions at home: Medicines  Take over-the-counter and prescription medicines only as told by your doctor. Some medicines are safe and some medicines are not safe during pregnancy.  Take a prenatal vitamin that contains at least 600  micrograms (mcg) of folic acid.  If you have trouble pooping (constipation), take medicine that will make your stool soft (stool softener) if your doctor approves. Eating and drinking   Eat regular, healthy meals.  Avoid raw meat and uncooked cheese.  If you get low calcium from the food you eat, talk to your doctor about taking a daily calcium supplement.  Avoid foods that are high in fat and sugars, such as fried and sweet foods.  If you feel sick to your stomach (nauseous) or throw up (vomit): ? Eat 4 or 5 small meals a day instead of 3 large meals. ? Try eating a few soda crackers. ? Drink liquids between meals instead of during meals.  To prevent constipation: ? Eat foods that are high in fiber, like fresh fruits and vegetables, whole grains, and beans. ? Drink enough fluids to keep your pee (urine) clear or pale yellow. Activity  Exercise only as told by your doctor. Stop exercising if you start to have cramps.  Do not exercise if it is too hot, too humid, or if you are in a place of great height (high altitude).  Avoid heavy lifting.  Wear low-heeled shoes. Sit and stand up straight.  You can continue to have sex unless your doctor tells you not to. Relieving pain and discomfort  Wear a good support bra if your breasts are tender.  Take warm water baths (sitz baths) to soothe pain or discomfort caused by hemorrhoids. Use hemorrhoid cream if your doctor approves.  Rest with your legs raised if you have leg cramps or low back pain.  If you develop puffy, bulging veins (varicose veins) in your legs: ? Wear support hose or compression stockings as told by your doctor. ? Raise (elevate) your feet for 15 minutes, 3-4 times a day. ? Limit salt in your food. Prenatal care  Write down your questions. Take them to your prenatal visits.  Keep all your prenatal visits as told by your doctor. This is important. Safety  Wear your seat belt when driving.  Make a list of  emergency phone numbers, including numbers for family, friends, the hospital, and police and fire departments. General instructions  Ask your doctor about the right foods to eat or for help finding a counselor, if you need these services.  Ask your doctor  about local prenatal classes. Begin classes before month 6 of your pregnancy.  Do not use hot tubs, steam rooms, or saunas.  Do not douche or use tampons or scented sanitary pads.  Do not cross your legs for long periods of time.  Visit your dentist if you have not done so. Use a soft toothbrush to brush your teeth. Floss gently.  Avoid all smoking, herbs, and alcohol. Avoid drugs that are not approved by your doctor.  Do not use any products that contain nicotine or tobacco, such as cigarettes and e-cigarettes. If you need help quitting, ask your doctor.  Avoid cat litter boxes and soil used by cats. These carry germs that can cause birth defects in the baby and can cause a loss of your baby (miscarriage) or stillbirth. Contact a doctor if:  You have mild cramps or pressure in your lower belly.  You have pain when you pee (urinate).  You have bad smelling fluid coming from your vagina.  You continue to feel sick to your stomach (nauseous), throw up (vomit), or have watery poop (diarrhea).  You have a nagging pain in your belly area.  You feel dizzy. Get help right away if:  You have a fever.  You are leaking fluid from your vagina.  You have spotting or bleeding from your vagina.  You have severe belly cramping or pain.  You lose or gain weight rapidly.  You have trouble catching your breath and have chest pain.  You notice sudden or extreme puffiness (swelling) of your face, hands, ankles, feet, or legs.  You have not felt the baby move in over an hour.  You have severe headaches that do not go away when you take medicine.  You have trouble seeing. Summary  The second trimester is from week 14 through week  27 (months 4 through 6). This is often the time in pregnancy that you feel your best.  To take care of yourself and your unborn baby, you will need to eat healthy meals, take medicines only if your doctor tells you to do so, and do activities that are safe for you and your baby.  Call your doctor if you get sick or if you notice anything unusual about your pregnancy. Also, call your doctor if you need help with the right food to eat, or if you want to know what activities are safe for you. This information is not intended to replace advice given to you by your health care provider. Make sure you discuss any questions you have with your health care provider. Document Revised: 05/05/2018 Document Reviewed: 02/17/2016 Elsevier Patient Education  [place].

## 2019-10-12 ENCOUNTER — Telehealth: Payer: Self-pay | Admitting: Lactation Services

## 2019-10-12 DIAGNOSIS — O280 Abnormal hematological finding on antenatal screening of mother: Secondary | ICD-10-CM

## 2019-10-12 LAB — AFP, SERUM, OPEN SPINA BIFIDA
AFP MoM: 6.64
AFP Value: 159.7 ng/mL
Gest. Age on Collection Date: 16.5 weeks
Maternal Age At EDD: 27.7 yr
OSBR Risk 1 IN: 10
Test Results:: POSITIVE — AB
Weight: 246 [lb_av]

## 2019-10-12 NOTE — Telephone Encounter (Signed)
AFP abnormal. Spoke with Dr. Earlene Plater and Genetic Counseling and [name] moved up to 9/30 for evaluation.   Called patient in regards to her question sent in via My Chart after getting the results. Explained AFP to patient and that follow up testing needed to rule out spinal column abnormalities.   Patient tearful. Reviewed that [name] will give mor information and that she will also meet with Genetic Counselor to discuss.   Enc patient to call back with any questions or concerns.

## 2019-10-15 ENCOUNTER — Telehealth: Payer: Self-pay | Admitting: Internal Medicine

## 2019-10-15 NOTE — Telephone Encounter (Signed)
Patient called stating she's been having to use more insulin and she needed Dr Lonzo Cloud to change dosage to 30 units - she said she only has one pen left.  [facility] [address] [place], [place] - [address]  [address], [place] [address]  Phone:  [phone] Fax:  [phone]

## 2019-10-19 ENCOUNTER — Other Ambulatory Visit: Payer: Self-pay | Admitting: *Deleted

## 2019-10-19 ENCOUNTER — Encounter: Payer: Self-pay | Admitting: Family Medicine

## 2019-10-19 MED ORDER — LANTUS SOLOSTAR 100 UNIT/ML ~~LOC~~ SOPN: 20.0000 [IU] | PEN_INJECTOR | Freq: Every day | SUBCUTANEOUS | 99 refills | Status: DC

## 2019-10-19 NOTE — Telephone Encounter (Signed)
Notified pt have sample  2 pen  Lantus 100 units/ml  in the fridge and to be ready pick-up front office ok by Dr. Lonzo Cloud. Routed the Call to front office to make an appointment with [facility].

## 2019-10-19 NOTE — Telephone Encounter (Signed)
Pt came by office & staff gave pt  2 pens of Lantus 100 units/ml from a box in the fridge as instructed.

## 2019-10-19 NOTE — Telephone Encounter (Signed)
Pt called stated been taking 30 units of lantus instead of 20 units due having reading--yesterday cbg-85 (without food a.m) 120 (with food a.m) and today reading 78(without food a.m) needed refill insulin as soon as possible. Please advise

## 2019-10-24 ENCOUNTER — Ambulatory Visit: Payer: 59 | Admitting: Internal Medicine

## 2019-10-25 ENCOUNTER — Ambulatory Visit: Payer: 59 | Attending: Obstetrics and Gynecology | Admitting: *Deleted

## 2019-10-25 ENCOUNTER — Other Ambulatory Visit: Payer: Self-pay | Admitting: *Deleted

## 2019-10-25 ENCOUNTER — Ambulatory Visit (HOSPITAL_BASED_OUTPATIENT_CLINIC_OR_DEPARTMENT_OTHER): Payer: 59 | Admitting: Genetic Counselor

## 2019-10-25 ENCOUNTER — Other Ambulatory Visit: Payer: Self-pay

## 2019-10-25 ENCOUNTER — Ambulatory Visit: Payer: Self-pay | Admitting: Genetic Counselor

## 2019-10-25 ENCOUNTER — Ambulatory Visit (HOSPITAL_BASED_OUTPATIENT_CLINIC_OR_DEPARTMENT_OTHER): Payer: 59

## 2019-10-25 DIAGNOSIS — Z363 Encounter for antenatal screening for malformations: Secondary | ICD-10-CM | POA: Diagnosis not present

## 2019-10-25 DIAGNOSIS — O281 Abnormal biochemical finding on antenatal screening of mother: Secondary | ICD-10-CM

## 2019-10-25 DIAGNOSIS — R772 Abnormality of alphafetoprotein: Secondary | ICD-10-CM

## 2019-10-25 DIAGNOSIS — N96 Recurrent pregnancy loss: Secondary | ICD-10-CM

## 2019-10-25 DIAGNOSIS — O0992 Supervision of high risk pregnancy, unspecified, second trimester: Secondary | ICD-10-CM | POA: Diagnosis not present

## 2019-10-25 DIAGNOSIS — O10912 Unspecified pre-existing hypertension complicating pregnancy, second trimester: Secondary | ICD-10-CM | POA: Diagnosis not present

## 2019-10-25 DIAGNOSIS — O0990 Supervision of high risk pregnancy, unspecified, unspecified trimester: Secondary | ICD-10-CM

## 2019-10-25 DIAGNOSIS — Z315 Encounter for genetic counseling: Secondary | ICD-10-CM | POA: Diagnosis not present

## 2019-10-25 DIAGNOSIS — Z3A18 18 weeks gestation of pregnancy: Secondary | ICD-10-CM | POA: Insufficient documentation

## 2019-10-25 DIAGNOSIS — O24112 Pre-existing diabetes mellitus, type 2, in pregnancy, second trimester: Secondary | ICD-10-CM | POA: Insufficient documentation

## 2019-10-25 DIAGNOSIS — O365990 Maternal care for other known or suspected poor fetal growth, unspecified trimester, not applicable or unspecified: Secondary | ICD-10-CM

## 2019-10-25 NOTE — Progress Notes (Signed)
10/25/2019  Nina Phillips Feb 05, 1990 MRN: [id number] DOV: 10/25/2019  Nina Phillips presented to the [facility] for a genetics consultation regarding increased MS-AFP results. Nina Phillips was accompanied to her appointment by her mother.   Indication for genetic counseling - Increased MS-AFP (6.64 MoM)  Prenatal history  Nina Phillips is a G26P0040, 29 y.o. female. Her current pregnancy has completed [redacted]w[redacted]d (Estimated Date of Delivery: 03/21/20). 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  Nina Phillips denied exposure to environmental toxins or chemical agents. She denied the use of alcohol, tobacco or street drugs. She reported taking prenatal vitamins, heparin, aspirin, Metformin, and Lantus. She denied significant viral illnesses, fevers, and bleeding during the course of her pregnancy. She reported a personal history of diabetes. Per records, she had a D&E in October 2019 following a miscarriage. Her medical and surgical histories were otherwise noncontributory.  Family History  A three generation pedigree was drafted and reviewed. The family history is remarkable for the following:  - Nina Phillips has a paternal family history of diabetes and strokes. Her father had a stroke at age 21, her paternal uncle had a stroke in his 15s, and her paternal grandmother had a stroke at age 24. Each of these individuals have diabetes, as does another paternal uncle. Nina Phillips reported that individuals on her paternal side "do not take care of themselves". We discussed that early stroke can be a feature of some genetic disorders; however, environmental and lifestyle factors can also certainly contribute to causing a stroke. For example, diabetes and hypertension are well-established risk factors for stroke.   - Nina Phillips's partner, Nina Phillips, was born with bilateral polydactyly of his hands and feet. The rest of  his developmental and medical history is noncontributory. We discussed that polydactyly is a form of a congenital birth defect. In every pregnancy, a woman starts out with a 3-5% chance of having a baby with any birth defect. However, isolated polydactyly in particular can appear to run in families in an autosomal dominant fashion. Thus, there is up to a 50% risk that the couple's children could also have polydactyly in addition to the risk for other forms of birth defects.  The remaining family histories were reviewed and found to be noncontributory for birth defects, intellectual disability, recurrent pregnancy loss, and known genetic conditions.    The patient's ancestry is African American. The father of the pregnancy's ancestry is African American. Ashkenazi Jewish ancestry and consanguinity were denied. Pedigree will be scanned under Media.  Discussion  Increased MS-AFP:  Nina Phillips was referred to genetic counseling to discuss the increased risk for an open neural tube defect (ONTD) identified on MS-AFP screening. Results of the screen indicated that the current pregnancy has a 1 in 10 (10%) risk to be affected with an ONTD such as spina bifida.  We reviewedtheseMS-AFP results in detail. We discussed that there are many explanations for an elevated AFPresult,includingtwin pregnancies, dating errors, ONTDs such as anencephaly or spina bifida, abdominal wall defects, placental abnormalities, fetal growth restriction, adverse obstetrical outcomes (oligohydramnios, placental abruption, intrauterine fetal demise/stillbirth, preterm delivery, premature rupture of membranes, or preeclampsia), or a normal variant. Nina Phillips had her anatomy ultrasound performed prior to her genetic counseling appointment.The ultrasound report will be sent under separate cover. Fetal growth measured two weeks behind dates on today's ultrasound. Additionally, anhydramnios was noted in one part of Nina Phillips's uterus,  making visualization  of fetal anatomy difficult. The fetus's posterior fossa and abdominal cord insertion site appeared normal, suggesting that an ONTD or abdominal wall defect is likely not present. Additionally, anatomy ultrasound did not detect twins.  We discussed that Nina Phillips's elevated MS-AFP level is likely in part due to fetal growth restriction, which can be associated with adverse obstetrical outcomes such as miscarriage or fetal loss. Because of this, fetal growthwill bemonitored more closely via ultrasound throughout the remainder of Nina Phillips's pregnancy.   Aneuploidy screening:  We also reviewed that Nina Phillips had samples drawn for [facility] noninvasive prenatal screening (NIPS) through [place] that failed three times due to insufficient fetal fraction. NIPS analyzes cell free fetal DNA found in the maternal blood circulation during pregnancy. This test is not diagnostic for chromosome conditions, but can provide information regarding the presence or absence of extra fetal DNA for chromosomes 13, 18, 21, and the sex chromosomes. The reported detection rate is greater than 99% for trisomy 21, greater than 98% for trisomy 18, and greater than 99% for trisomy 63. The false positive rate is reported to be less than 0.1% for any of these conditions.   We discussed that the term "fetal fraction" refers to the amount of sample that is believed to have come from placental DNA rather than maternal DNA. We reviewed that there are many possible reasons a sample may have a low fetal fraction, including early gestational age, high maternal BMI, suboptimal sample collection, maternal use of medications like low molecular weight heparin, pregnancy loss, pregnancy complications, and normal variation. Low fetal fraction can also be associated with chromosomal aneuploidy. Nina Phillips is currently taking heparin. Studies have indicated that heparin therapy is associated with an increased incidence of failed  NIPS due to low fetal fraction (Gromminger et al., 2015; Burns et al., 2017). We discussed that Nina Phillips's failed NIPS results may not indicate that there is anything wrong with her baby; rather, her personal history of heparin therapy could have prevented a sufficient amount of fetal DNA to be present in the sample.  We discussed that redrawing a new sample for NIPS is possible, either through [facility] or another laboratory that is able to analyze samples with a lower fetal fraction. If the redraw was successful and contained a sufficient fetal fraction, results could clarify the risks for chromosomal aneuploidy in the current pregnancy. Nina Phillips indicated that she was no longer interested in pursuing another redraw for NIPS.  Carrier screening:  Nina Phillips had Horizon carrier screening performed through [place], which was negative for 27 conditions. Thus, her risk to be a carrier for these 27 conditions (listed separately in the laboratory report) has been reduced but not eliminated. This puts her pregnancies at significantly decreased risk of being affected by one of these conditions.  Diagnostic testing:  Nina Phillips was also counseled regarding diagnostic testing via amniocentesis. We discussed the technical aspects of the procedure and quoted up to a 1 in 500 (0.2%) risk for spontaneous pregnancy loss or other adverse pregnancy outcomes as a result of amniocentesis. Cultured cells from an amniocentesis sample allow for the visualization of a fetal karyotype, which can detect >99% of large chromosomal aberrations. Chromosomal microarray can also be performed to identify smaller deletions or duplications of fetal chromosomal material. Amniocentesis could also be performed to assess whether the baby is affected by an ONTDvia analysis of AF-AFPand acetylcholinesterase. After careful consideration, Nina Phillips declined amniocentesis at this time. She understands that amniocentesis is available at any  point after 16 weeks of pregnancy and that she may opt to undergo the procedure at a later date should she change her mind.  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 We reviewed that there are many potential causes of recurrent miscarriages, including anatomic, immunologic, endocrine, and genetic factors. However, a cause is only identified in 50% of individuals who experience recurrent miscarriages. Anatomic factors associated with recurrent miscarriages can include septate uterus, leiomyoma, intrauterine adhesions, and cervical insufficiency. Immunologic factors such as antiphospholipid syndrome, an autoimmune disorder associated with inappropriate blood clot formation, account for 15-40% of recurrent miscarriages. Endocrine factors such as polycystic ovarian syndrome and poorly controlled diabetes can also contribute to recurrent pregnancy loss. Genetic factors may include chromosomal abnormalities and single gene disorders. We discussed that 2-5% of individuals who experience multiple miscarriages carry a balanced chromosomal rearrangement that can become unbalanced and potentially lethal in their offspring.   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 While this could be the cause of her history of recurrent pregnancy loss, we discussed karyotype as another testing option to assess for chromosomal aberrations in Nina Phillips that could possibly become unbalanced in her children. Nina Phillips informed me that she thinks she had a karyotype performed as a part of her infertility workup. She believed her results were normal; however, this report was not available for my review.  Plan:  Additional screening and diagnostic testing were declined today. Nina Phillips informed me that she feels supported by her faith in God and is praying that her baby will survive through the end of the pregnancy. She will return every two weeks for ultrasounds  to monitor fetal growth and assess for better views of fetal anatomy. She understands that screening tests, including ultrasound, cannot rule out all birth defects or genetic syndromes.  I counseled Nina Phillips regarding the above risks and available options. The approximate face-to-face time with the genetic counselor was 30 minutes.  In summary:  Reviewed MS-AFP results  Elevated MS-AFP (MoM6.64,1 in10risk for open neural tube defects)  Discussed reasons for receiving this type of result, including fetal growth restriction and adverse obstetrical outcomes  Reviewed results of ultrasound  No evidence of open neural tube defect or abdominal wall defect, though views of fetal anatomy were limited  Discussed reasons for insufficient fetal fraction on NIPS  Heparin may contribute to low fetal fraction  Discussed history of recurrent pregnancy loss  Abnormal uterine shape (bicornuate vs septate uterus) likely contributing to history of losses  Offered additional testing and screening  Declined redraw for NIPS  Declined amniocentesis  Declined karyotype for recurrent pregnancy loss. Believes she had negative karyotype as part of infertility workup in past (result not available for my review)  Reviewed family history concerns   Gershon Crane, MS, Aeronautical engineer

## 2019-10-29 ENCOUNTER — Telehealth: Payer: Self-pay | Admitting: Family Medicine

## 2019-10-29 ENCOUNTER — Ambulatory Visit: Payer: 59

## 2019-10-29 NOTE — Telephone Encounter (Signed)
patient called to cancel her appointment for 10-31-2019, I asked if she want to reschedule and she said "NO"

## 2019-10-30 ENCOUNTER — Encounter: Payer: Self-pay | Admitting: *Deleted

## 2019-10-30 ENCOUNTER — Encounter: Payer: Self-pay | Admitting: Obstetrics and Gynecology

## 2019-10-30 DIAGNOSIS — R772 Abnormality of alphafetoprotein: Secondary | ICD-10-CM | POA: Insufficient documentation

## 2019-10-31 ENCOUNTER — Ambulatory Visit (INDEPENDENT_AMBULATORY_CARE_PROVIDER_SITE_OTHER): Payer: 59 | Admitting: Internal Medicine

## 2019-10-31 ENCOUNTER — Encounter: Payer: Self-pay | Admitting: Internal Medicine

## 2019-10-31 ENCOUNTER — Encounter: Payer: 59 | Admitting: Obstetrics and Gynecology

## 2019-10-31 ENCOUNTER — Other Ambulatory Visit: Payer: Self-pay

## 2019-10-31 VITALS — BP 124/76 | HR 74 | Ht 64.0 in | Wt 248.4 lb

## 2019-10-31 DIAGNOSIS — R809 Proteinuria, unspecified: Secondary | ICD-10-CM

## 2019-10-31 DIAGNOSIS — E1129 Type 2 diabetes mellitus with other diabetic kidney complication: Secondary | ICD-10-CM

## 2019-10-31 DIAGNOSIS — E1165 Type 2 diabetes mellitus with hyperglycemia: Secondary | ICD-10-CM

## 2019-10-31 DIAGNOSIS — O24312 Unspecified pre-existing diabetes mellitus in pregnancy, second trimester: Secondary | ICD-10-CM | POA: Diagnosis not present

## 2019-10-31 DIAGNOSIS — Z794 Long term (current) use of insulin: Secondary | ICD-10-CM

## 2019-10-31 LAB — POCT GLYCOSYLATED HEMOGLOBIN (HGB A1C): Hemoglobin A1C: 5.8 % — AB (ref 4.0–5.6)

## 2019-10-31 MED ORDER — INSULIN PEN NEEDLE 31G X 5 MM MISC: 1.0000 | Freq: Four times a day (QID) | 3 refills | Status: DC

## 2019-10-31 MED ORDER — NOVOLOG FLEXPEN 100 UNIT/ML ~~LOC~~ SOPN: 4.0000 [IU] | PEN_INJECTOR | Freq: Three times a day (TID) | SUBCUTANEOUS | 11 refills | Status: DC

## 2019-10-31 MED ORDER — LANTUS SOLOSTAR 100 UNIT/ML ~~LOC~~ SOPN
24.0000 [IU] | PEN_INJECTOR | Freq: Every day | SUBCUTANEOUS | 2 refills | Status: DC
Start: 2019-10-31 — End: 2019-11-02

## 2019-10-31 MED ORDER — FREESTYLE LIBRE 2 SENSOR MISC: 1.0000 | 11 refills | Status: DC

## 2019-10-31 MED ORDER — FREESTYLE LIBRE 2 READER DEVI: 1.0000 | 0 refills | Status: DC

## 2019-10-31 NOTE — Progress Notes (Signed)
Name: Nina Phillips  Age/ Sex: 29 y.o., female   MRN/ DOB: [id number], 1990-02-24     PCP: Arvilla Market, DO   Reason for Endocrinology Evaluation: Type 2 Diabetes Mellitus  Initial Endocrine Consultative Visit:  09/08/2018    PATIENT IDENTIFIER: Nina Phillips is a 29 y.o. female with a past medical history of T2DM. The patient has followed with Endocrinology clinic since 09/08/2018 for consultative assistance with management of her diabetes.  DIABETIC HISTORY:  Nina Phillips was diagnosed with T2 DM in 06/2018. Her hemoglobin A1c has ranged from 10.6% on her initial diagnosis.  On her initial visit to our clinic she had an A1c of 10.3%, she was on Lantus only, we started Metformin. Invokana caused yeast infection .     She had ~ 5 miscarriages in 2020, the last one was 07/2018 Lives with boyfriend and his grandmother  SUBJECTIVE:   During the last visit (09/28/2019): A1c 7.7 %. Continued Lantus and metformin    Today (10/31/2019): Nina Phillips is here for a follow up on diabetes.  She had contacted our office requesting increase dose of lantus from 20 units to 30 units as she was running short on her insulin supply.  She checks her blood sugars 3-4 times daily. The patient has not had hypoglycemic episodes since the last clinic visit.  She is currently 20 weeks of gestation    HOME DIABETES REGIMEN:  Metformin 1000 tablet, half a tablet twice a day  Lantus 20 units daily - taking 30 units       METER DOWNLOAD SUMMARY: 9/23-10/06/2019 Average Number Tests/Day = 1.4 Overall Mean FS Glucose = 80   BG Ranges: Low = 59 High = 104   Hypoglycemic Events/30 Days: BG < 50 = 0 Episodes of symptomatic severe hypoglycemia = 0       DIABETIC COMPLICATIONS: Microvascular complications:    Denies: CKD, Neuropathy, retinopathy    Macrovascular complications:    Denies: CAD, CVA, PVD   HISTORY:  Past Medical History:  Past Medical History:  Diagnosis  Date  . Abnormal uterine bleeding (AUB) 07/09/2019  . Anemia   . Anxiety   . Bicornate uterus   . Depression    seeing a therapist since 3rd miscarriage, helping, doing good.  . Diabetes mellitus without complication (HCC)    Type 2  . Gout   . History of miscarriage   . Ovarian cyst   . Trichomonas infection 08/24/2018   Treated 08/17/18   Past Surgical History:  Past Surgical History:  Procedure Laterality Date  . DILATION AND CURETTAGE OF UTERUS    . WISDOM TOOTH EXTRACTION      Social History:  reports that she has quit smoking. She has never used smokeless tobacco. She reports that she does not drink alcohol and does not use drugs. Family History:  Family History  Problem Relation Age of Onset  . Cancer Other   . Hypertension Other   . Diabetes Other   . Hypertension Mother   . Gout Mother   . Diabetes Father   . Hypertension Father   . Stroke Father   . Hypertension Maternal Grandfather   . Heart attack Maternal Grandfather   . Stroke Paternal Grandmother   . Hypertension Maternal Aunt   . Cancer Maternal Grandmother        breast  . Diabetes Paternal Grandfather      HOME MEDICATIONS: Allergies as of 10/31/2019      Reactions   Apple  Fruit Extract Hives   Banana Hives   Other Hives   Walnuts   Shellfish Allergy Hives   Tomato Hives   Watermelon [citrullus Vulgaris] Hives   Bactrim [sulfamethoxazole-trimethoprim] Hives, Rash   Sulfa Antibiotics Hives, Rash      Medication List       Accurate as of October 31, 2019  7:29 AM. If you have any questions, ask your nurse or doctor.        Accu-Chek FastClix Lancets Misc USE TO CHECK BLOOD SUGAR FOUR TIMES DAILY AND AS NEEDED   Accu-Chek Guide test strip Generic drug: glucose blood USE 1 STRIP 4 TIMES DAILY AND AS NEEDED   aspirin EC 81 MG tablet Take 1 tablet (81 mg total) by mouth daily.   Doxylamine-Pyridoxine 10-10 MG Tbec Take 1 tablet by mouth daily. 2 tabs qhs. Add 1 with breakfast and  then 1 with lunch prn   heparin 5000 UNIT/ML injection Inject 1 mL (5,000 Units total) into the skin every 12 (twelve) hours.   Insulin Pen Needle 32G X 4 MM Misc 1 Device by Does not apply route in the morning, at noon, in the evening, and at bedtime.   Lantus SoloStar 100 UNIT/ML Solostar Pen Generic drug: insulin glargine Inject 20 Units into the skin daily. What changed: how much to take   Lantus SoloStar 100 UNIT/ML Solostar Pen Generic drug: insulin glargine Inject 20 Units into the skin daily. What changed: Another medication with the same name was changed. Make sure you understand how and when to take each.   metFORMIN 500 MG tablet Commonly known as: GLUCOPHAGE Take 1 tablet (500 mg total) by mouth 2 (two) times daily with a meal.   Prenatal Vitamin 27-0.8 MG Tabs Take 1 tablet by mouth daily.        OBJECTIVE:   Vital Signs: BP 124/76 (BP Location: Right Arm, Patient Position: Sitting, Cuff Size: Normal)   Pulse 74   Ht 5\' 4"  (1.626 m)   Wt 248 lb 6.4 oz (112.7 kg)   LMP 06/15/2019   SpO2 99%   BMI 42.64 kg/m   Wt Readings from Last 3 Encounters:  10/10/19 246 lb (111.6 kg)  09/28/19 242 lb (109.8 kg)  09/17/19 242 lb (109.8 kg)     Exam: General: Pt appears well and is in NAD  Lungs: Clear with good BS bilat with no rales, rhonchi, or wheezes  Heart: RRR with normal S1 and S2 and no gallops; no murmurs; no rub  Extremities: No pretibial edema  Neuro: MS is good with appropriate affect, pt is alert and Ox3    DM foot exam: 06/22/2019    The skin of the feet is intact without sores or ulcerations. The pedal pulses are 2+ on right and 2+ on left. The sensation is intact to a screening 5.07, 10 gram monofilament bilaterally          DATA REVIEWED:  Lab Results  Component Value Date   HGBA1C 7.7 (H) 08/06/2019   HGBA1C 9.1 (A) 06/22/2019   HGBA1C 10.3 (H) 08/09/2018   Lab Results  Component Value Date   MICROALBUR 31.3 06/22/2019    LDLCALC 143 (H) 06/22/2019   CREATININE 0.89 08/06/2019    ASSESSMENT / PLAN / RECOMMENDATIONS:   1) Type 2 Diabetes Mellitus, Optimally Controlled, With microalbuminuria  - Most recent A1c of 5.8 %. Goal A1c < 6.5 %.     - A1c at goal  - I have discouraged the pt from  self increasing her insulin due to risk of hypoglycemia, she has been noted with a BG of 59 mg/dL. We discussed that hypoglycemia could be fatal and we also discussed the effect on fetus. - She has not been able to check after meals, her insurance does not cover Dexcom, will try freestyle libre  - Will start prandial insulin with supper for now, she will be provided with a correctional scale as below  - Pt to notify us should her Bg's remain above pregnancy goals    TIMING  BLOOD SUGAR GOALS  FASTING (before breakfast)  60 - 95  BEFORE MEALS LESS THAN 100  2 hours AFTER MEAL LESS THAN 120    MEDICATIONS: - Continue Metformin 500 mg, twice a day - Decrease Lantus from 30 to 24  units daily  - Start Novolog 4 units with supper   - CF : NOvolog ( BG- 100/30)   EDUCATION / INSTRUCTIONS:  BG monitoring instructions: Patient is instructed to check her blood sugars 6 imes a day, preprandial and 2 hours postprandial   Call [place] [facility] if: BG persistently < 60  . I reviewed the Rule of 15 for the treatment of hypoglycemia in detail with the patient. Literature supplied.     2) Diabetic complications:   Eye: Does not have known diabetic retinopathy.   Neuro/ Feet: Does not have known diabetic peripheral neuropathy.  Renal: Patient does not have known baseline CKD. But has microalbuminuria  F/U in 2 months    Signed electronically by: Lyndle Herrlich, MD  [facility] Endocrinology  [facility] [address] [place], [place] [address] Phone: [phone] FAX: [phone]   CC: Arvilla Market, DO [address] [place] [address] Phone:  [phone]  Fax: [phone]  Return to Endocrinology clinic as below: Future Appointments  Date Time Provider Department Center  10/31/2019 11:10 AM Simi Briel, Konrad Dolores, MD [facility] None  11/07/2019  8:00 AM [facility] NURSE [facility] [address] [facility]  11/07/2019  8:15 AM [facility] US2 [facility]

## 2019-10-31 NOTE — Patient Instructions (Addendum)
-   Continue Metformin 500 mg, twice daily  - Decrease  Lantus 24 units daily  - Novolog 4 units with supper for now    - Novolog correctional insulin: ADD extra units on insulin to your meal-time Novolog dose if your blood sugars are higher than 130 , to be used BEFORE MEAL SUGAR . Use the scale below to help guide you:   Blood sugar before meal Number of units to inject  Less than 130 0 unit  131-  160 1 units  161 -  190 2 units  191 -  220 3 units  221 -  250 4 units  251 -  280 5 units  281 -  310 6 units    - Send me a "my chart message " if your sugars are higher then above   TIMING  BLOOD SUGAR GOALS  FASTING (before breakfast)  60 - 95  BEFORE MEALS LESS THAN 100  2 hours AFTER MEAL LESS THAN 120         HOW TO TREAT LOW BLOOD SUGARS (Blood sugar LESS THAN 60 MG/DL)  Please follow the RULE OF 15 for the treatment of hypoglycemia treatment (when your (blood sugars are less than 60 mg/dL)    STEP 1: Take 15 grams of carbohydrates when your blood sugar is low, which includes:   3-4 GLUCOSE TABS  OR  3-4 OZ OF JUICE OR REGULAR SODA OR  ONE TUBE OF GLUCOSE GEL     STEP 2: RECHECK blood sugar in 15 MINUTES STEP 3: If your blood sugar is still low at the 15 minute recheck --> then, go back to STEP 1 and treat AGAIN with another 15 grams of carbohydrates.

## 2019-11-01 ENCOUNTER — Ambulatory Visit: Payer: 59

## 2019-11-01 ENCOUNTER — Other Ambulatory Visit: Payer: 59

## 2019-11-02 ENCOUNTER — Encounter: Payer: Self-pay | Admitting: Internal Medicine

## 2019-11-02 DIAGNOSIS — O24312 Unspecified pre-existing diabetes mellitus in pregnancy, second trimester: Secondary | ICD-10-CM | POA: Insufficient documentation

## 2019-11-02 DIAGNOSIS — E1165 Type 2 diabetes mellitus with hyperglycemia: Secondary | ICD-10-CM | POA: Insufficient documentation

## 2019-11-02 MED ORDER — LANTUS SOLOSTAR 100 UNIT/ML ~~LOC~~ SOPN: 24.0000 [IU] | PEN_INJECTOR | Freq: Every day | SUBCUTANEOUS | 2 refills | Status: DC

## 2019-11-07 ENCOUNTER — Telehealth: Payer: Self-pay | Admitting: Genetic Counselor

## 2019-11-07 ENCOUNTER — Ambulatory Visit: Payer: 59

## 2019-11-07 ENCOUNTER — Other Ambulatory Visit: Payer: Self-pay

## 2019-11-07 ENCOUNTER — Ambulatory Visit (HOSPITAL_BASED_OUTPATIENT_CLINIC_OR_DEPARTMENT_OTHER): Payer: 59

## 2019-11-07 DIAGNOSIS — Z794 Long term (current) use of insulin: Secondary | ICD-10-CM

## 2019-11-07 DIAGNOSIS — Z7984 Long term (current) use of oral hypoglycemic drugs: Secondary | ICD-10-CM

## 2019-11-07 DIAGNOSIS — Z363 Encounter for antenatal screening for malformations: Secondary | ICD-10-CM

## 2019-11-07 DIAGNOSIS — O99212 Obesity complicating pregnancy, second trimester: Secondary | ICD-10-CM

## 2019-11-07 DIAGNOSIS — O365920 Maternal care for other known or suspected poor fetal growth, second trimester, not applicable or unspecified: Secondary | ICD-10-CM

## 2019-11-07 DIAGNOSIS — O364XX0 Maternal care for intrauterine death, not applicable or unspecified: Secondary | ICD-10-CM

## 2019-11-07 DIAGNOSIS — O365990 Maternal care for other known or suspected poor fetal growth, unspecified trimester, not applicable or unspecified: Secondary | ICD-10-CM

## 2019-11-07 DIAGNOSIS — Z3A20 20 weeks gestation of pregnancy: Secondary | ICD-10-CM

## 2019-11-07 DIAGNOSIS — O4102X0 Oligohydramnios, second trimester, not applicable or unspecified: Secondary | ICD-10-CM

## 2019-11-07 DIAGNOSIS — O24112 Pre-existing diabetes mellitus, type 2, in pregnancy, second trimester: Secondary | ICD-10-CM

## 2019-11-07 DIAGNOSIS — O34592 Maternal care for other abnormalities of gravid uterus, second trimester: Secondary | ICD-10-CM

## 2019-11-07 DIAGNOSIS — Q513 Bicornate uterus: Secondary | ICD-10-CM

## 2019-11-07 DIAGNOSIS — E669 Obesity, unspecified: Secondary | ICD-10-CM

## 2019-11-07 DIAGNOSIS — O10912 Unspecified pre-existing hypertension complicating pregnancy, second trimester: Secondary | ICD-10-CM

## 2019-11-07 NOTE — Telephone Encounter (Signed)
I called Dr. Bobbe Medico at [facility] to determine if [name] could be scheduled for a D&E procedure this week given the finding of fetal demise on today's ultrasound. Per Dr. Grace Bushy, [name] informed him that she would prefer to have a D&E over induction of labor. Dr. Hyacinth Meeker is reaching out to her scheduling team to try to have [name] scheduled for a telehealth consult tomorrow and a D&E this Friday (10/13).   I called [name] to notify her that she will likely be hearing from [facility] very soon to be scheduled for these appointments. [name] was appropriately emotional following the devastating news she received this morning. I offered her support resources, including support groups for fetal loss, which she declined at this time. I encouraged her to reach out to me if there is anything else we could do to support her in this trying time.  Gershon Crane, MS, [facility] Genetic Counselor

## 2019-11-09 ENCOUNTER — Inpatient Hospital Stay (HOSPITAL_COMMUNITY): Payer: 59

## 2019-11-09 NOTE — Telephone Encounter (Signed)
Received a call from Dr. Hyacinth Meeker informing me that after meeting with Nina Phillips, she has elected an induction of labor procedure rather than a D&E as she wants the opportunity to hold her baby. Per Dr. Hyacinth Meeker, Nina Phillips also expressed interest in Anora miscarriage genetic testing as well as a fetal autopsy. I notified Dr. Judeth Cornfield of the patient's desires and he contacted the on-call physician in labor and delivery here at [facility] to schedule Nina Phillips for an induction procedure.  Gershon Crane, MS, [facility] Genetic Counselor

## 2019-11-10 ENCOUNTER — Inpatient Hospital Stay (HOSPITAL_COMMUNITY)
Admission: AD | Admit: 2019-11-10 | Discharge: 2019-11-11 | DRG: 806 | Disposition: A | Discharge status: Home / Self Care | Payer: 59 | Attending: Family Medicine | Admitting: Family Medicine

## 2019-11-10 ENCOUNTER — Other Ambulatory Visit: Payer: Self-pay

## 2019-11-10 ENCOUNTER — Inpatient Hospital Stay (HOSPITAL_COMMUNITY): Payer: 59 | Admitting: Anesthesiology

## 2019-11-10 DIAGNOSIS — Z8759 Personal history of other complications of pregnancy, childbirth and the puerperium: Secondary | ICD-10-CM | POA: Diagnosis present

## 2019-11-10 DIAGNOSIS — Z79899 Other long term (current) drug therapy: Secondary | ICD-10-CM

## 2019-11-10 DIAGNOSIS — Z6841 Body Mass Index (BMI) 40.0 and over, adult: Secondary | ICD-10-CM

## 2019-11-10 DIAGNOSIS — Z23 Encounter for immunization: Secondary | ICD-10-CM | POA: Diagnosis not present

## 2019-11-10 DIAGNOSIS — O3402 Maternal care for unspecified congenital malformation of uterus, second trimester: Secondary | ICD-10-CM | POA: Diagnosis present

## 2019-11-10 DIAGNOSIS — Z20822 Contact with and (suspected) exposure to covid-19: Secondary | ICD-10-CM | POA: Diagnosis present

## 2019-11-10 DIAGNOSIS — Z794 Long term (current) use of insulin: Secondary | ICD-10-CM

## 2019-11-10 DIAGNOSIS — Z882 Allergy status to sulfonamides status: Secondary | ICD-10-CM

## 2019-11-10 DIAGNOSIS — Z7901 Long term (current) use of anticoagulants: Secondary | ICD-10-CM

## 2019-11-10 DIAGNOSIS — O2492 Unspecified diabetes mellitus in childbirth: Secondary | ICD-10-CM | POA: Diagnosis present

## 2019-11-10 DIAGNOSIS — Z833 Family history of diabetes mellitus: Secondary | ICD-10-CM | POA: Diagnosis not present

## 2019-11-10 DIAGNOSIS — O164 Unspecified maternal hypertension, complicating childbirth: Secondary | ICD-10-CM | POA: Diagnosis present

## 2019-11-10 DIAGNOSIS — O1214 Gestational proteinuria, complicating childbirth: Secondary | ICD-10-CM | POA: Diagnosis present

## 2019-11-10 DIAGNOSIS — O24912 Unspecified diabetes mellitus in pregnancy, second trimester: Secondary | ICD-10-CM | POA: Diagnosis present

## 2019-11-10 DIAGNOSIS — E1129 Type 2 diabetes mellitus with other diabetic kidney complication: Secondary | ICD-10-CM

## 2019-11-10 DIAGNOSIS — O2622 Pregnancy care for patient with recurrent pregnancy loss, second trimester: Secondary | ICD-10-CM | POA: Diagnosis present

## 2019-11-10 DIAGNOSIS — Z7982 Long term (current) use of aspirin: Secondary | ICD-10-CM

## 2019-11-10 DIAGNOSIS — O364XX0 Maternal care for intrauterine death, not applicable or unspecified: Principal | ICD-10-CM | POA: Diagnosis present

## 2019-11-10 DIAGNOSIS — N96 Recurrent pregnancy loss: Secondary | ICD-10-CM | POA: Diagnosis present

## 2019-11-10 DIAGNOSIS — Z91018 Allergy to other foods: Secondary | ICD-10-CM

## 2019-11-10 DIAGNOSIS — Z3A21 21 weeks gestation of pregnancy: Secondary | ICD-10-CM

## 2019-11-10 DIAGNOSIS — E1165 Type 2 diabetes mellitus with hyperglycemia: Secondary | ICD-10-CM

## 2019-11-10 DIAGNOSIS — Q513 Bicornate uterus: Secondary | ICD-10-CM | POA: Diagnosis not present

## 2019-11-10 DIAGNOSIS — R8271 Bacteriuria: Secondary | ICD-10-CM | POA: Diagnosis present

## 2019-11-10 DIAGNOSIS — Z888 Allergy status to other drugs, medicaments and biological substances status: Secondary | ICD-10-CM

## 2019-11-10 DIAGNOSIS — B009 Herpesviral infection, unspecified: Secondary | ICD-10-CM | POA: Diagnosis present

## 2019-11-10 DIAGNOSIS — O10919 Unspecified pre-existing hypertension complicating pregnancy, unspecified trimester: Secondary | ICD-10-CM | POA: Diagnosis present

## 2019-11-10 DIAGNOSIS — Z91013 Allergy to seafood: Secondary | ICD-10-CM | POA: Diagnosis not present

## 2019-11-10 DIAGNOSIS — Z8249 Family history of ischemic heart disease and other diseases of the circulatory system: Secondary | ICD-10-CM | POA: Diagnosis not present

## 2019-11-10 LAB — GLUCOSE, CAPILLARY
Glucose-Capillary: 87 mg/dL (ref 70–99)
Glucose-Capillary: 149 mg/dL — ABNORMAL HIGH (ref 70–99)
Glucose-Capillary: 81 mg/dL (ref 70–99)

## 2019-11-10 LAB — CBC
HCT: 35.9 % — ABNORMAL LOW (ref 36.0–46.0)
Hemoglobin: 11.2 g/dL — ABNORMAL LOW (ref 12.0–15.0)
MCH: 24.8 pg — ABNORMAL LOW (ref 26.0–34.0)
MCHC: 31.2 g/dL (ref 30.0–36.0)
MCV: 79.4 fL — ABNORMAL LOW (ref 80.0–100.0)
Platelets: 461 10*3/uL — ABNORMAL HIGH (ref 150–400)
RBC: 4.52 MIL/uL (ref 3.87–5.11)
RDW: 16.7 % — ABNORMAL HIGH (ref 11.5–15.5)
WBC: 16.8 10*3/uL — ABNORMAL HIGH (ref 4.0–10.5)
nRBC: 0 % (ref 0.0–0.2)

## 2019-11-10 LAB — TYPE AND SCREEN
ABO/RH(D): O POS
Antibody Screen: NEGATIVE

## 2019-11-10 LAB — RESPIRATORY PANEL BY RT PCR (FLU A&B, COVID)
Influenza A by PCR: NEGATIVE
Influenza B by PCR: NEGATIVE
SARS Coronavirus 2 by RT PCR: NEGATIVE

## 2019-11-10 LAB — RPR: RPR Ser Ql: NONREACTIVE

## 2019-11-10 MED ORDER — ONDANSETRON HCL 4 MG/2ML IJ SOLN
4.0000 mg | Freq: Four times a day (QID) | INTRAMUSCULAR | Status: DC | PRN
  Administered 2019-11-11: 4 mg via INTRAVENOUS
  Filled 2019-11-10 (×2): qty 2

## 2019-11-10 MED ORDER — INSULIN ASPART 100 UNIT/ML ~~LOC~~ SOLN
0.0000 [IU] | Freq: Three times a day (TID) | SUBCUTANEOUS | Status: DC
  Administered 2019-11-10: 1 [IU] via SUBCUTANEOUS

## 2019-11-10 MED ORDER — OXYTOCIN 10 UNIT/ML IJ SOLN
INTRAMUSCULAR | Status: AC
  Filled 2019-11-10: qty 4

## 2019-11-10 MED ORDER — MISOPROSTOL 200 MCG PO TABS
ORAL_TABLET | ORAL | Status: AC
  Administered 2019-11-10: 400 ug
  Filled 2019-11-10: qty 5

## 2019-11-10 MED ORDER — SOD CITRATE-CITRIC ACID 500-334 MG/5ML PO SOLN
30.0000 mL | ORAL | Status: DC | PRN
  Filled 2019-11-10: qty 15

## 2019-11-10 MED ORDER — MISOPROSTOL 200 MCG PO TABS
400.0000 ug | ORAL_TABLET | ORAL | Status: AC
  Administered 2019-11-10 (×2): 400 ug via VAGINAL
  Filled 2019-11-10 (×2): qty 2

## 2019-11-10 MED ORDER — INSULIN GLARGINE 100 UNIT/ML ~~LOC~~ SOLN
12.0000 [IU] | Freq: Every day | SUBCUTANEOUS | Status: DC
  Filled 2019-11-10 (×2): qty 0.12

## 2019-11-10 MED ORDER — EPHEDRINE 5 MG/ML INJ: 10.0000 mg | INTRAVENOUS | Status: DC | PRN

## 2019-11-10 MED ORDER — LACTATED RINGERS IV SOLN: 500.0000 mL | Freq: Once | INTRAVENOUS | Status: DC

## 2019-11-10 MED ORDER — METFORMIN HCL 500 MG PO TABS
500.0000 mg | ORAL_TABLET | Freq: Two times a day (BID) | ORAL | Status: DC
  Filled 2019-11-10: qty 1

## 2019-11-10 MED ORDER — LIDOCAINE HCL (PF) 1 % IJ SOLN: 30.0000 mL | INTRAMUSCULAR | Status: DC | PRN

## 2019-11-10 MED ORDER — OXYTOCIN BOLUS FROM INFUSION: 333.0000 mL | Freq: Once | INTRAVENOUS | Status: DC

## 2019-11-10 MED ORDER — LACTATED RINGERS IV SOLN: 500.0000 mL | INTRAVENOUS | Status: DC | PRN

## 2019-11-10 MED ORDER — MISOPROSTOL 50MCG HALF TABLET
100.0000 ug | ORAL_TABLET | Freq: Once | ORAL | Status: AC
  Administered 2019-11-10: 100 ug via ORAL
  Filled 2019-11-10: qty 2

## 2019-11-10 MED ORDER — LACTATED RINGERS IV SOLN: INTRAVENOUS | Status: DC

## 2019-11-10 MED ORDER — FLEET ENEMA 7-19 GM/118ML RE ENEM: 1.0000 | ENEMA | RECTAL | Status: DC | PRN

## 2019-11-10 MED ORDER — MISOPROSTOL 200 MCG PO TABS
ORAL_TABLET | ORAL | Status: AC
  Filled 2019-11-10: qty 2

## 2019-11-10 MED ORDER — FENTANYL CITRATE (PF) 100 MCG/2ML IJ SOLN
50.0000 ug | INTRAMUSCULAR | Status: DC | PRN
  Administered 2019-11-10 (×4): 100 ug via INTRAVENOUS
  Filled 2019-11-10 (×4): qty 2

## 2019-11-10 MED ORDER — MISOPROSTOL 200 MCG PO TABS
200.0000 ug | ORAL_TABLET | Freq: Once | ORAL | Status: AC
  Administered 2019-11-11: 200 ug via BUCCAL
  Filled 2019-11-10: qty 1

## 2019-11-10 MED ORDER — DIPHENHYDRAMINE HCL 50 MG/ML IJ SOLN: 12.5000 mg | INTRAMUSCULAR | Status: DC | PRN

## 2019-11-10 MED ORDER — PHENYLEPHRINE 40 MCG/ML (10ML) SYRINGE FOR IV PUSH (FOR BLOOD PRESSURE SUPPORT): 80.0000 ug | PREFILLED_SYRINGE | INTRAVENOUS | Status: DC | PRN

## 2019-11-10 MED ORDER — OXYTOCIN-SODIUM CHLORIDE 30-0.9 UT/500ML-% IV SOLN
INTRAVENOUS | Status: AC
  Filled 2019-11-10: qty 500

## 2019-11-10 MED ORDER — FENTANYL-BUPIVACAINE-NACL 0.5-0.125-0.9 MG/250ML-% EP SOLN
12.0000 mL/h | EPIDURAL | Status: DC | PRN
  Filled 2019-11-10: qty 250

## 2019-11-10 MED ORDER — INSULIN ASPART 100 UNIT/ML ~~LOC~~ SOLN: 0.0000 [IU] | Freq: Every day | SUBCUTANEOUS | Status: DC

## 2019-11-10 MED ORDER — OXYTOCIN-SODIUM CHLORIDE 30-0.9 UT/500ML-% IV SOLN: 2.5000 [IU]/h | INTRAVENOUS | Status: DC

## 2019-11-10 MED ORDER — ACETAMINOPHEN 325 MG PO TABS: 650.0000 mg | ORAL_TABLET | ORAL | Status: DC | PRN

## 2019-11-10 MED ORDER — OXYTOCIN 10 UNIT/ML IJ SOLN
INTRAMUSCULAR | Status: AC
  Filled 2019-11-10: qty 1

## 2019-11-10 NOTE — Progress Notes (Signed)
Nina Phillips is a 29 y.o. G5P0040 at [redacted]w[redacted]d, admitted for IUFD  Subjective: Sleeping when CNM entered room  Objective: BP 130/65    Pulse 88    Temp 98.9 F (37.2 C) (Oral)    Resp 18    Ht 5\' 3"  (1.6 m)    Wt 112.5 kg    LMP 06/15/2019    BMI 43.93 kg/m  No intake/output data recorded. No intake/output data recorded.  SVE:   Dilation: Fingertip Effacement (%): Thick Exam by:: [email or web address], RNC  Labs: Lab Results  Component Value Date   WBC 16.8 (H) 11/10/2019   HGB 11.2 (L) 11/10/2019   HCT 35.9 (L) 11/10/2019   MCV 79.4 (L) 11/10/2019   PLT 461 (H) 11/10/2019    Assessment / Plan: --S/p cytotec #2 (400 mcg) at 0957 --FT per RN exam prior to placement --Sleeping through discomfort --Support person at bedside --No needs at this time  11/12/2019, CNM 11/10/2019, 12:25 PM

## 2019-11-10 NOTE — H&P (Signed)
OBSTETRIC ADMISSION HISTORY AND PHYSICAL  Nina Phillips is a 29 y.o. female G5P0040 with IUP at [redacted]w[redacted]d by LMP presenting for IOL for IUFD. She reports no blurry vision, headaches or peripheral edema, and RUQ pain.   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 Abnormal uterine bleeding (AUB) 07/09/2019  . Anemia   . Anxiety   . Bicornate uterus   . Depression    seeing a therapist since 3rd miscarriage, helping, doing good.  . Diabetes mellitus without complication (HCC)    Type 2  . Gout   . History of miscarriage   . Ovarian cyst   . Trichomonas infection 08/24/2018   Treated 08/17/18    Past Surgical History: Past Surgical History:  Procedure Laterality Date  . DILATION AND CURETTAGE OF UTERUS    . WISDOM TOOTH EXTRACTION      Obstetrical History: OB History    Gravida  5   Para      Term      Preterm      AB  4   Living  0     SAB  4   TAB      Ectopic      Multiple  0   Live Births              Social History Social History   Socioeconomic History  . Marital status: Single    Spouse name: Not on file  . Number of children: Not on file  . Years of education: Not on file  . Highest education level: Not on file  Occupational History  . Not on file  Tobacco Use  . Smoking status: Former 08/19/18  . Smokeless tobacco: Never Used  . Tobacco comment: quit 2014  Vaping Use  . Vaping Use:  Never used  Substance and Sexual Activity  . Alcohol use: No    Comment: occas  . Drug use: No  . Sexual activity: Yes    Birth control/protection: Implant    Comment: taken out on 07/20/19  Other Topics Concern  . Not on file  Social History Narrative  . Not on file   Social Determinants of Health   Financial Resource Strain:   . Difficulty of Paying Living Expenses: Not on file  Food Insecurity: No Food Insecurity  . Worried About 07/22/19 in the Last Year: Never true  . Ran Out of Food in the Last Year: Never true  Transportation Needs: No Transportation Needs  . Lack of Transportation (Medical): No  . Lack of Transportation (Non-Medical): No  Physical Activity:   . Days of Exercise per Week: Not on file  . Minutes of Exercise per Session: Not on file  Stress:   . Feeling of Stress : Not on file  Social Connections:   . Frequency of Communication with Friends and Family: Not on file  . Frequency of Social Gatherings with Friends  and Family: Not on file  . Attends Religious Services: Not on file  . Active Member of Clubs or Organizations: Not on file  . Attends Banker Meetings: Not on file  . Marital Status: Not on file    Family History: Family History  Problem Relation Age of Onset  . Cancer Other   . Hypertension Other   . Diabetes Other   . Hypertension Mother   . Gout Mother   . Diabetes Father   . Hypertension Father   . Stroke Father   . Hypertension Maternal Grandfather   . Heart attack Maternal Grandfather   . Stroke Paternal Grandmother   . Hypertension Maternal Aunt   . Cancer Maternal Grandmother        breast  . Diabetes Paternal Grandfather     Allergies: Allergies  Allergen Reactions  . Apple Fruit Extract Hives  . Banana Hives  . Other Hives    Walnuts   . Shellfish Allergy Hives  . Tomato Hives  . Watermelon [Citrullus Vulgaris] Hives  . Bactrim [Sulfamethoxazole-Trimethoprim] Hives and Rash  . Sulfa  Antibiotics Hives and Rash    Medications Prior to Admission  Medication Sig Dispense Refill Last Dose  . Accu-Chek FastClix Lancets MISC USE TO CHECK BLOOD SUGAR FOUR TIMES DAILY AND AS NEEDED     . ACCU-CHEK GUIDE test strip USE 1 STRIP 4 TIMES DAILY AND AS NEEDED     . aspirin EC 81 MG tablet Take 1 tablet (81 mg total) by mouth daily. 60 tablet 2   . Continuous Blood Gluc Receiver (FREESTYLE LIBRE 2 READER) DEVI 1 Device by Does not apply route as directed. 1 each 0   . Continuous Blood Gluc Sensor (FREESTYLE LIBRE 2 SENSOR) MISC 1 Device by Does not apply route as directed. 2 each 11   . Doxylamine-Pyridoxine 10-10 MG TBEC Take 1 tablet by mouth daily. 2 tabs qhs. Add 1 with breakfast and then 1 with lunch prn 90 tablet 3   . heparin 5000 UNIT/ML injection Inject 1 mL (5,000 Units total) into the skin every 12 (twelve) hours. 60 mL 1   . insulin aspart (NOVOLOG FLEXPEN) 100 UNIT/ML FlexPen Inject 4 Units into the skin 3 (three) times daily with meals. 15 mL 11   . insulin glargine (LANTUS SOLOSTAR) 100 UNIT/ML Solostar Pen Inject 24 Units into the skin daily. 30 mL 2   . Insulin Pen Needle 31G X 5 MM MISC 1 Device by Does not apply route in the morning, at noon, in the evening, and at bedtime. 100 each 3   . metFORMIN (GLUCOPHAGE) 500 MG tablet Take 1 tablet (500 mg total) by mouth 2 (two) times daily with a meal. 180 tablet 3   . Prenatal Vit-Fe Fumarate-FA (PRENATAL VITAMIN) 27-0.8 MG TABS Take 1 tablet by mouth daily.        Review of Systems   All systems reviewed and negative except as stated in HPI  Last menstrual period 06/15/2019, unknown if currently breastfeeding. General appearance: alert and cooperative Lungs: clear to auscultation bilaterally Heart: regular rate and rhythm Abdomen: soft, non-tender; bowel sounds normal Pelvic: deferred Extremities: Homans sign is negative, no sign of DVT  Prenatal labs: ABO, Rh: O/Positive/-- (07/12 1120) Antibody: Negative (07/12  1120) Rubella: 2.84 (07/12 1120) RPR: Non Reactive (07/12 1120)  HBsAg: Negative (07/12 1120)  HIV: Non Reactive (07/12 1120)  GBS:    1 hr Glucola: Hx of DMII  Genetic screening  Abnormal, see  above  Anatomy US abnormal, see above   Prenatal Transfer Tool  Maternal Diabetes: Yes:  Diabetes Type:  Pre-pregnancy Genetic Screening: Abnormal:  Results: Other: elevated AFP, failed nips Maternal Ultrasounds/Referrals: Other: abnl anatomy scan Fetal Ultrasounds or other Referrals:  Referred to Materal Fetal Medicine  Maternal Substance Abuse:  No Significant Maternal Medications:  Meds include: Other: metformin, insulin, heparin  Significant Maternal Lab Results:  Elevated AFP   No results found for this or any previous visit (from the past 24 hour(s)).  Patient Active Problem List   Diagnosis Date Noted  . IUFD at 20 weeks or more of gestation 11/10/2019  . Pre-existing diabetes mellitus affecting pregnancy in second trimester, antepartum 11/02/2019  . Type 2 diabetes mellitus with hyperglycemia, without long-term current use of insulin (HCC) 11/02/2019  . Elevated AFP 10/30/2019  . Insufficient fetal fraction x 2 on Panorama 10/08/2019  . BMI 40.0-44.9, adult (HCC) 08/27/2019  . Obesity in pregnancy 08/27/2019  . Chronic hypertension during pregnancy, antepartum 08/27/2019  . Anticoagulant long-term use 08/27/2019  . Pre-existing diabetes mellitus affecting pregnancy in first trimester, antepartum 08/13/2019  . Supervision of high risk pregnancy, antepartum 08/06/2019  . Type 2 diabetes mellitus with microalbuminuria, with long-term current use of insulin (HCC) 06/26/2019  . GBS bacteriuria 08/20/2018  . 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 I4P8099 at [redacted]w[redacted]d here for IOL for IUFD  #Induction of Labor for IUFD: start w 100 mcg cytotec. Desires autopsy. Desires  chaplin and support services.   -last dose of heparin was on Tuesday 10/12  #DM2: On metformin, lantus 24 units, 4 units aspart at dinner PTA. Last dose of lantus was 10/15 at 9PM. Will reduce lantus to 12 units, check CBG ACHS with SSI. Hold metformin.   #Pain: meds prn      11/10/2019, 4:34 AM Casper Harrison, MD [facility], [facility], [facility]

## 2019-11-10 NOTE — Anesthesia Procedure Notes (Signed)
Epidural Patient location during procedure: OB Start time: 11/10/2019 6:10 PM End time: 11/10/2019 6:26 PM  Staffing Anesthesiologist: Trevor Iha, MD Performed: anesthesiologist   Preanesthetic Checklist Completed: patient identified, IV checked, site marked, risks and benefits discussed, surgical consent, monitors and equipment checked, pre-op evaluation and timeout performed  Epidural Patient position: sitting Prep: DuraPrep and site prepped and draped Patient monitoring: continuous pulse ox and blood pressure Approach: midline Location: L3-L4 Injection technique: LOR air  Needle:  Needle type: Tuohy  Needle gauge: 17 G Needle length: 9 cm and 9 Needle insertion depth: 9 cm Catheter type: closed end flexible Catheter size: 19 Gauge Catheter at skin depth: 15 cm Test dose: negative  Assessment Events: blood not aspirated, injection not painful, no injection resistance, no paresthesia and negative IV test  Additional Notes Patient identified. Risks/Benefits/Options discussed with patient including but not limited to bleeding, infection, nerve damage, paralysis, failed block, incomplete pain control, headache, blood pressure changes, nausea, vomiting, reactions to medication both or allergic, itching and postpartum back pain. Confirmed with bedside nurse the patient's most recent platelet count. Confirmed with patient that they are not currently taking any anticoagulation, have any bleeding history or any family history of bleeding disorders. Patient expressed understanding and wished to proceed. All questions were answered. Sterile technique was used throughout the entire procedure. Please see nursing notes for vital signs. Test dose was given through epidural needle and negative prior to continuing to dose epidural or start infusion. Warning signs of high block given to the patient including shortness of breath, tingling/numbness in hands, complete motor block, or any  concerning symptoms with instructions to call for help. Patient was given instructions on fall risk and not to get out of bed. All questions and concerns addressed with instructions to call with any issues. 1 Attempt (S) . Patient tolerated procedure well.

## 2019-11-10 NOTE — Progress Notes (Signed)
Ricarda Atayde is a 29 y.o. O1B5102 at [redacted]w[redacted]d   Subjective: Patient endorses mild suprapubic cramping and mild nausea. Denies questions, concerns. Planning to speak with Chaplain but not at this time  Objective: BP 123/72   Pulse 77   Temp 98.5 F (36.9 C) (Oral)   Resp 18   Ht 5\' 3"  (1.6 m)   Wt 112.5 kg   LMP 06/15/2019   BMI 43.93 kg/m  No intake/output data recorded. No intake/output data recorded.   Labs: Lab Results  Component Value Date   WBC 16.8 (H) 11/10/2019   HGB 11.2 (L) 11/10/2019   HCT 35.9 (L) 11/10/2019   MCV 79.4 (L) 11/10/2019   PLT 461 (H) 11/10/2019    Assessment / Plan: --29 y.o. 34 at [redacted]w[redacted]d  --Admitted for IUFD --S/p Cytotec #1 at 0550 --Cytotec dosing discussed with Dr. [redacted]w[redacted]d, plan for Shawnie Pons q 4 hours  , CNM 11/10/2019, 9:45 AM

## 2019-11-10 NOTE — Plan of Care (Signed)

## 2019-11-10 NOTE — Anesthesia Preprocedure Evaluation (Signed)
Anesthesia Evaluation  Patient identified by MRN, date of birth, ID band Patient awake    Reviewed: Allergy & Precautions, NPO status , Patient's Chart, lab work & pertinent test results  Airway Mallampati: III  TM Distance: >3 FB Neck ROM: Full    Dental no notable dental hx. (+) Teeth Intact, Dental Advisory Given   Pulmonary former smoker,    Pulmonary exam normal breath sounds clear to auscultation       Cardiovascular hypertension, Normal cardiovascular exam Rhythm:Regular Rate:Normal     Neuro/Psych Anxiety Depression    GI/Hepatic negative GI ROS, Neg liver ROS,   Endo/Other  diabetes, Gestational  Renal/GU negative Renal ROS     Musculoskeletal   Abdominal (+) + obese,   Peds  Hematology Lab Results      Component                Value               Date                      WBC                      16.8 (H)            11/10/2019                HGB                      11.2 (L)            11/10/2019                HCT                      35.9 (L)            11/10/2019                MCV                      79.4 (L)            11/10/2019                PLT                      461 (H)             11/10/2019              Anesthesia Other Findings   Reproductive/Obstetrics (+) Pregnancy                             Anesthesia Physical Anesthesia Plan  ASA: III  Anesthesia Plan: Epidural   Post-op Pain Management:    Induction:   PONV Risk Score and Plan:   Airway Management Planned:   Additional Equipment:   Intra-op Plan:   Post-operative Plan:   Informed Consent: I have reviewed the patients History and Physical, chart, labs and discussed the procedure including the risks, benefits and alternatives for the proposed anesthesia with the patient or authorized representative who has indicated his/her understanding and acceptance.       Plan Discussed with:    Anesthesia Plan Comments: (21.1 Wk G5P0 IUFD for LEA)        Anesthesia Quick Evaluation

## 2019-11-10 NOTE — Progress Notes (Signed)
Ismael Treptow is a 29 y.o. X4V8592 at [redacted]w[redacted]d admitted for IUFD  Subjective: Feeling more uncomfortable. Endorses lower abdominal cramping, pain score 8/10  Objective: BP 120/63   Pulse 79   Temp 98.9 F (37.2 C) (Oral)   Resp 18   Ht 5\' 3"  (1.6 m)   Wt 112.5 kg   LMP 06/15/2019   SpO2 100%   BMI 43.93 kg/m  No intake/output data recorded. No intake/output data recorded.  SVE:   Dilation: 1.5 Effacement (%): 80 Station: -2 Exam by:: [email or web address], RNC  Labs: Lab Results  Component Value Date   WBC 16.8 (H) 11/10/2019   HGB 11.2 (L) 11/10/2019   HCT 35.9 (L) 11/10/2019   MCV 79.4 (L) 11/10/2019   PLT 461 (H) 11/10/2019    Assessment / Plan: --Cytotec # 3 placed at 1413 --Now 1.5cm per RN exam --IV Fentanyl available for pain relief --Patient resting in bed, no needs at this time  11/12/2019, CNM 11/10/2019, 1430 PM

## 2019-11-11 ENCOUNTER — Encounter (HOSPITAL_COMMUNITY): Payer: Self-pay | Admitting: Obstetrics & Gynecology

## 2019-11-11 DIAGNOSIS — O364XX0 Maternal care for intrauterine death, not applicable or unspecified: Secondary | ICD-10-CM

## 2019-11-11 DIAGNOSIS — Z3A21 21 weeks gestation of pregnancy: Secondary | ICD-10-CM

## 2019-11-11 LAB — GLUCOSE, CAPILLARY
Glucose-Capillary: 68 mg/dL — ABNORMAL LOW (ref 70–99)
Glucose-Capillary: 68 mg/dL — ABNORMAL LOW (ref 70–99)
Glucose-Capillary: 91 mg/dL (ref 70–99)
Glucose-Capillary: 82 mg/dL (ref 70–99)
Glucose-Capillary: 112 mg/dL — ABNORMAL HIGH (ref 70–99)

## 2019-11-11 MED ORDER — PRENATAL MULTIVITAMIN CH: 1.0000 | ORAL_TABLET | Freq: Every day | ORAL | Status: DC

## 2019-11-11 MED ORDER — ACETAMINOPHEN 325 MG PO TABS: 650.0000 mg | ORAL_TABLET | ORAL | Status: DC | PRN

## 2019-11-11 MED ORDER — ONDANSETRON HCL 4 MG/2ML IJ SOLN: 4.0000 mg | INTRAMUSCULAR | Status: DC | PRN

## 2019-11-11 MED ORDER — SIMETHICONE 80 MG PO CHEW: 80.0000 mg | CHEWABLE_TABLET | ORAL | Status: DC | PRN

## 2019-11-11 MED ORDER — MISOPROSTOL 200 MCG PO TABS
600.0000 ug | ORAL_TABLET | Freq: Once | ORAL | Status: DC
  Filled 2019-11-11: qty 3

## 2019-11-11 MED ORDER — MISOPROSTOL 200 MCG PO TABS
400.0000 ug | ORAL_TABLET | Freq: Once | ORAL | Status: AC
  Administered 2019-11-11: 400 ug via VAGINAL

## 2019-11-11 MED ORDER — DIBUCAINE (PERIANAL) 1 % EX OINT: 1.0000 "application " | TOPICAL_OINTMENT | CUTANEOUS | Status: DC | PRN

## 2019-11-11 MED ORDER — COCONUT OIL OIL: 1.0000 "application " | TOPICAL_OIL | Status: DC | PRN

## 2019-11-11 MED ORDER — BENZOCAINE-MENTHOL 20-0.5 % EX AERO: 1.0000 "application " | INHALATION_SPRAY | CUTANEOUS | Status: DC | PRN

## 2019-11-11 MED ORDER — WITCH HAZEL-GLYCERIN EX PADS: 1.0000 "application " | MEDICATED_PAD | CUTANEOUS | Status: DC | PRN

## 2019-11-11 MED ORDER — IBUPROFEN 600 MG PO TABS: 600.0000 mg | ORAL_TABLET | Freq: Three times a day (TID) | ORAL | Status: DC | PRN

## 2019-11-11 MED ORDER — DIPHENHYDRAMINE HCL 25 MG PO CAPS: 25.0000 mg | ORAL_CAPSULE | Freq: Four times a day (QID) | ORAL | Status: DC | PRN

## 2019-11-11 MED ORDER — SENNOSIDES-DOCUSATE SODIUM 8.6-50 MG PO TABS: 2.0000 | ORAL_TABLET | ORAL | Status: DC

## 2019-11-11 MED ORDER — ACETAMINOPHEN 325 MG PO TABS: 650.0000 mg | ORAL_TABLET | Freq: Four times a day (QID) | ORAL | Status: DC | PRN

## 2019-11-11 MED ORDER — IBUPROFEN 600 MG PO TABS
600.0000 mg | ORAL_TABLET | Freq: Four times a day (QID) | ORAL | Status: DC
  Administered 2019-11-11: 600 mg via ORAL
  Filled 2019-11-11: qty 1

## 2019-11-11 MED ORDER — LANTUS SOLOSTAR 100 UNIT/ML ~~LOC~~ SOPN: 20.0000 [IU] | PEN_INJECTOR | Freq: Every day | SUBCUTANEOUS | 2 refills | Status: DC

## 2019-11-11 MED ORDER — TETANUS-DIPHTH-ACELL PERTUSSIS 5-2.5-18.5 LF-MCG/0.5 IM SUSP
0.5000 mL | Freq: Once | INTRAMUSCULAR | Status: AC
  Administered 2019-11-11: 0.5 mL via INTRAMUSCULAR
  Filled 2019-11-11 (×2): qty 0.5

## 2019-11-11 MED ORDER — ONDANSETRON HCL 4 MG PO TABS: 4.0000 mg | ORAL_TABLET | ORAL | Status: DC | PRN

## 2019-11-11 NOTE — Progress Notes (Signed)
Patient's blood glucose 91. Encouraged to take in small amounts every 1-2 hours to sustain glucose level.

## 2019-11-11 NOTE — Anesthesia Postprocedure Evaluation (Signed)
Anesthesia Post Note  Patient: Aissatou Fronczak  Procedure(s) Performed: AN AD HOC LABOR EPIDURAL     Patient location during evaluation: Other Anesthesia Type: Epidural Level of consciousness: awake and alert and oriented Pain management: satisfactory to patient Vital Signs Assessment: post-procedure vital signs reviewed and stable Respiratory status: respiratory function stable Cardiovascular status: stable Postop Assessment: no headache, no backache, epidural receding, patient able to bend at knees, no signs of nausea or vomiting, adequate PO intake and able to ambulate Anesthetic complications: no   No complications documented.  Last Vitals:  Vitals:   11/11/19 1510 11/11/19 1615  BP: 123/66 (!) 132/59  Pulse: 75 81  Resp: 16 16  Temp:    SpO2:      Last Pain:  Vitals:   11/11/19 1615  TempSrc:   PainSc: 8    Pain Goal:                   Silvano Garofano

## 2019-11-11 NOTE — Progress Notes (Signed)
Patient's blood glucose 68, encouraged to eat. She did take in small amount of juice/ peanut butter crackers. Discussed with Dr. Barb Merino. Patient states she feels okay.

## 2019-11-11 NOTE — Discharge Instructions (Signed)
Recurrent Pregnancy Loss Recurrent pregnancy loss is the loss of two or more pregnancies before 20 weeks of pregnancy (gestation). What are the causes? The most common cause of recurrent pregnancy loss is an abnormal number of chromosomes in the developing baby (fetus). Chromosomes are the structures inside a cell that hold all the genetic material. Chromosome abnormalities can be inherited, but most of them occur by chance. In most cases of recurrent pregnancy loss, a missing or extra chromosome keeps the baby from developing. It may not be possible to identify which chromosome is defective. Other possible causes of recurrent pregnancy loss include:  Being born with an abnormal womb structure (septate uterus).  Having noncancerous growths in your uterus (fibroids or polyps).  Having a disease that causes scarring in your uterus (Asherman syndrome).  Having a disease that causes your blood to clot (antiphospholipid syndrome).  Having a disease that increases bleeding (thrombophilia). What increases the risk? The following factors may make you more likely to develop this condition:  Being over the age of 78.  Having diabetes.  Having thyroid disease.  Being Obese.  Being a smoker.  Using recreational drugs.  Using too much alcohol or caffeine. What are the signs or symptoms? Symptoms of this condition include:  Bleeding from the vagina.  Passing clots and fetal tissue from the vagina. How is this diagnosed? This condition is diagnosed with:  A physical exam. This will include a pelvic exam to check the vagina and uterus for possible causes of pregnancy loss.  An ultrasound. This is done: ? To confirm the pregnancy loss. ? To see if the structure of the uterus is normal. ? To check for polyps or fibroids. Sometimes other tests are done, such as:  Blood tests to see if you have a condition that causes recurrent pregnancy loss.  Blood tests to see if your blood clots  normally.  Genetic tests of you and your partner. How is this treated? Treatment for this condition depends on the cause of the pregnancy loss. Possible treatments include:  Having your eggs fertilized outside your uterus (in vitro fertilization). By doing this, a health care provider may be able to select eggs without chromosome abnormalities.  Taking a blood thinner to prevent clotting. This may be done if you have antiphospholipid syndrome.  Having surgery to correct the abnormality in your uterus.  Taking medicines to treat underlying causes of pregnancy loss. Follow these instructions at home: Lifestyle  Do not use any products that contain nicotine or tobacco, such as cigarettes and e-cigarettes. If you need help quitting, ask your health care provider.  Do not use drugs.  Eat a balanced diet rich in fresh fruit and vegetables, whole grains, low-fat dairy, and lean meats.  Manage any chronic health conditions, such as diabetes or thyroid disease.  Get 30 minutes of moderate daily exercise.  If you are overweight, ask your health care provider for support and resources to lose weight.  Find ways to manage stress. These include journaling, yoga, or deep breathing. They also include spending time in nature and practicing meditation. General instructions  Take over-the-counter and prescription medicines only as told by your health care provider.  Get support from friends and loved ones. Unexpected pregnancy loss can be a sad and stressful event.  Consider meeting with a counselor, spiritual leader, or a pregnancy loss support group.  Ask your health care provider about taking a folic acid supplement.  Meet with a Dentist as part of genetic testing  to understand the results of any testing.  Keep all follow-up visits as told by your health care provider. This is important. Contact a health care provider if:  You have been trying to get pregnant without  success.  You are struggling with sadness or depression. Get help right away if:  You have feelings of sadness that take over your thoughts.  You have thoughts of hurting yourself. Summary  The most common cause of recurrent pregnancy loss is an abnormal number of chromosomes in the developing baby (fetus).  Most of the time, recurrent pregnancy loss happens by chance.  Talk to your health care provider if you are trying to get pregnant and have a history of recurrent pregnancy loss. This information is not intended to replace advice given to you by your health care provider. Make sure you discuss any questions you have with your health care provider. Document Revised: 05/05/2018 Document Reviewed: 04/07/2016 Elsevier Patient Education  2020 Elsevier Inc.   Postpartum Care After Vaginal Delivery Follow these instructions at home: Vaginal bleeding  It is normal to have vaginal bleeding (lochia) after delivery. Wear a sanitary pad for vaginal bleeding and discharge. ? During the first week after delivery, the amount and appearance of lochia is often similar to a menstrual period. ? Over the next few weeks, it will gradually decrease to a dry, yellow-brown discharge. ? For most women, lochia stops completely by 4-6 weeks after delivery. Vaginal bleeding can vary from woman to woman.  Change your sanitary pads frequently. Watch for any changes in your flow, such as: ? A sudden increase in volume. ? A change in color. ? Large blood clots.  If you pass a blood clot from your vagina, save it and call your health care provider to discuss. Do not flush blood clots down the toilet before talking with your health care provider.  Do not use tampons or douches until your health care provider says this is safe.  Perineal care  Keep the area between the vagina and the anus (perineum) clean and dry as told by your health care provider. Use medicated pads and pain-relieving sprays and creams  as directed.  If you had a cut in the perineum (episiotomy) or a tear in the vagina, check the area for signs of infection until you are healed. Check for: ? More redness, swelling, or pain. ? Fluid or blood coming from the cut or tear. ? Warmth. ? Pus or a bad smell.  Breast care  Within the first few days after delivery, your breasts may feel heavy, full, and uncomfortable (breast engorgement). Milk may also leak from your breasts. Your health care provider can suggest ways to help relieve the discomfort. Breast engorgement should go away within a few days. ? Avoid touching your breasts a lot. Doing this can make your breasts produce more milk. ? Wear a good-fitting bra and use cold packs to help with swelling. ? Do not squeeze out (express) milk. This causes you to make more milk. Intimacy and sexuality  Ask your health care provider when you can engage in sexual activity. This may depend on: ? Your risk of infection. ? How fast you are healing. ? Your comfort and desire to engage in sexual activity.  You are able to get pregnant after delivery, even if you have not had your period. If desired, talk with your health care provider about methods of birth control (contraception). Medicines  Take over-the-counter and prescription medicines only as told by your  health care provider.  Activity  Gradually return to your normal activities as told by your health care provider. Ask your health care provider what activities are safe for you.  Rest as much as possible. Try to rest or take a nap while your baby is sleeping. Eating and drinking   Drink enough fluid to keep your urine pale yellow.  Eat high-fiber foods every day. These may help prevent or relieve constipation. High-fiber foods include: ? Whole grain cereals and breads. ? Brown rice. ? Beans. ? Fresh fruits and vegetables.  Do not try to lose weight quickly by cutting back on calories.  Take your prenatal vitamins  until your postpartum checkup or until your health care provider tells you it is okay to stop. Lifestyle  Do not use any products that contain nicotine or tobacco, such as cigarettes and e-cigarettes. If you need help quitting, ask your health care provider.  General instructions  Keep all follow-up visits for you as told by your health care provider. Most women visit their health care provider for a postpartum checkup within the first 3-6 weeks after delivery. Contact a health care provider if:  You feel unusually sad or worried.  Your breasts become red, painful, or hard.  You have a fever.  You have trouble holding urine or keeping urine from leaking.  You have little or no interest in activities you used to enjoy.  You have questions about caring for yourself  You pass a blood clot from your vagina. Get help right away if:  You have chest pain.  You have difficulty breathing.  You have sudden, severe leg pain.  You have severe pain or cramping in your lower abdomen.  You bleed from your vagina so much that you fill more than one sanitary pad in one hour. Bleeding should not be heavier than your heaviest period.  You develop a severe headache.  You faint.  You have blurred vision or spots in your vision.  You have bad-smelling vaginal discharge.  You have thoughts about hurting yourself  If you ever feel like you may hurt yourself or others, or have thoughts about taking your own life, get help right away. You can go to the nearest emergency department or call:  Your local emergency services (911 in the [place]  A suicide crisis helpline, such as the National Suicide Prevention Lifeline at (787) 469-0019. This is open 24 hours a day. Summary  The period of time right after you deliver up to 6-12 weeks after delivery is called the postpartum period.  Gradually return to your normal activities as told by your health care provider.  Keep all follow-up visits for  you as told by your health care provider. This information is not intended to replace advice given to you by your health care provider. Make sure you discuss any questions you have with your health care provider. Document Revised: 01/14/2017 Document Reviewed: 10/25/2016 Elsevier Patient Education  2020 [employer].

## 2019-11-11 NOTE — Discharge Summary (Signed)
Postpartum Discharge Summary  Patient Name: Nina Phillips DOB: 03/16/1990 MRN: [id number]  Date of admission: 11/10/2019 Delivery date:11/11/2019  Delivering provider: Donnamae Jude  Date of discharge: 11/12/2019  Admitting diagnosis: IUFD at 33 weeks or more of gestation [O36.4XX0] Intrauterine pregnancy: [redacted]w[redacted]d    Secondary diagnosis:  Active Problems:   HSV-2 infection   Bicornuate uterus   History of recurrent miscarriages   GBS bacteriuria   Type 2 diabetes mellitus with microalbuminuria, with long-term current use of insulin (HCC)   BMI 40.0-44.9, adult ([place]   Chronic hypertension during pregnancy, antepartum   Anticoagulant long-term use   IUFD at 20 weeks or more of gestation   Vaginal delivery  Additional problems: as noted above    Discharge diagnosis: IUFD with Vaginal delivery                                Post partum procedures: Tdap vaccine Augmentation: Cytotec Complications: None  Hospital course: Induction of Labor With Vaginal Delivery   29y.o. yo [id number] at 288w2das admitted to the hospital 11/10/2019 for induction of labor.  Indication for induction: IUFD.  Patient had an uncomplicated labor course as follows: Membrane Rupture Time/Date:  ,11/11/2019   Delivery Method:Vaginal, Spontaneous  Episiotomy: None  Lacerations:  None  Details of delivery can be found in separate delivery note. Patient had a routine postpartum course. Patient is discharged home 11/12/19.  Newborn Data: Birth date:11/11/2019  Birth time:10:00 AM  Gender:Female  Living status:Fetal Demise  Apgars: ,  Weight:150 g   Magnesium Sulfate received: No BMZ received: No Rhophylac:N/A MMR:No T-DaP:Given postpartum Flu: Yes Transfusion:No  Physical exam  Vitals:   11/11/19 1230 11/11/19 1301 11/11/19 1510 11/11/19 1615  BP:  (!) 110/52 123/66 (!) 132/59  Pulse:  86 75 81  Resp: _0 Temp:      TempSrc:      SpO2:      Weight:      Height:       General:  alert, cooperative and no distress Lochia: appropriate Uterine Fundus: firm Incision: N/A DVT Evaluation: No evidence of DVT seen on physical exam. No cords or calf tenderness. No significant calf/ankle edema. Labs: Lab Results  Component Value Date   WBC 16.8 (H) 11/10/2019   HGB 11.2 (L) 11/10/2019   HCT 35.9 (L) 11/10/2019   MCV 79.4 (L) 11/10/2019   PLT 461 (H) 11/10/2019   CMP Latest Ref Rng & Units 08/06/2019  Glucose 65 - 99 mg/dL 122(H)  BUN 6 - 20 mg/dL 6  Creatinine 0.57 - 1.00 mg/dL 0.89  Sodium 134 - 144 mmol/L 136  Potassium 3.5 - 5.2 mmol/L 4.4  Chloride 96 - 106 mmol/L 101  CO2 20 - 29 mmol/L 20  Calcium 8.7 - 10.2 mg/dL 9.4  Total Protein 6.0 - 8.5 g/dL 7.2  Total Bilirubin 0.0 - 1.2 mg/dL <0.2  Alkaline Phos 48 - 121 IU/L 103  AST 0 - 40 IU/L 15  ALT 0 - 32 IU/L 14   Edinburgh Score: No flowsheet data found.   After visit meds:  Allergies as of 11/11/2019      Reactions   Apple Fruit Extract Hives   Banana Hives   Other Hives   Walnuts   Shellfish Allergy Hives   Tomato Hives   Watermelon [citrullus Vulgaris] Hives   Bactrim [sulfamethoxazole-trimethoprim] Hives, Rash   Sulfa Antibiotics Hives,  Rash      Medication List    STOP taking these medications   Accu-Chek FastClix Lancets Misc   Accu-Chek Guide test strip Generic drug: glucose blood   aspirin EC 81 MG tablet   Doxylamine-Pyridoxine 10-10 MG Tbec   heparin 5000 UNIT/ML injection   NovoLOG FlexPen 100 UNIT/ML FlexPen Generic drug: insulin aspart     TAKE these medications   acetaminophen 325 MG tablet Commonly known as: Tylenol Take 2 tablets (650 mg total) by mouth every 6 (six) hours as needed for mild pain, moderate pain or headache (for pain scale < 4).   FreeStyle Libre 2 Reader Devi 1 Device by Does not apply route as directed.   FreeStyle Libre 2 Sensor Misc 1 Device by Does not apply route as directed.   ibuprofen 600 MG tablet Commonly known as: ADVIL Take  1 tablet (600 mg total) by mouth every 8 (eight) hours as needed.   Insulin Pen Needle 31G X 5 MM Misc 1 Device by Does not apply route in the morning, at noon, in the evening, and at bedtime.   Lantus SoloStar 100 UNIT/ML Solostar Pen Generic drug: insulin glargine Inject 20 Units into the skin daily. What changed: how much to take   metFORMIN 500 MG tablet Commonly known as: GLUCOPHAGE Take 1 tablet (500 mg total) by mouth 2 (two) times daily with a meal.   Prenatal Vitamin 27-0.8 MG Tabs Take 1 tablet by mouth daily.       Discharge home in stable condition Infant [place] Discharge instruction: per After Visit Summary and Postpartum booklet. Activity: Advance as tolerated. Pelvic rest for 6 weeks.  Diet: carb modified diet Future Appointments:No future appointments. Follow up Visit: Message sent to [facility] on 11/11/19 to schedule PP appt and 1 week mood check  Please schedule this patient for a In person postpartum visit in 4 weeks with the following provider: Any provider. Additional Postpartum F/U:Postpartum Depression checkup 1 week, pt also requests support group resources for recurrent pregnancy loss High risk pregnancy complicated by: S4DX, IUFD with recurrent miscarriages Delivery mode:  Vaginal, Spontaneous  Anticipated Birth Control:  Unsure   11/12/2019 Randa Ngo, MD

## 2019-11-11 NOTE — Progress Notes (Signed)
Foley catheter removed. 500 ml clear yellow urine output.

## 2019-11-11 NOTE — Progress Notes (Signed)
Nina Phillips is a 29 y.o. [id number] at [redacted]w[redacted]d admitted for IUFD  Subjective: Feeling some intermittent pressure, otherwise coping okay   Objective: BP 125/70    Pulse 77    Temp 99.5 F (37.5 C) (Oral)    Resp 16    Ht 5\' 3"  (1.6 m)    Wt 112.5 kg    LMP 06/15/2019    SpO2 100%    BMI 43.93 kg/m  No intake/output data recorded. Total I/O In: -  Out: 800 [Urine:800]  SVE:   Dilation: 1.5 Effacement (%): 50 Station: -2 Exam by:: J Hazelwood & Sam [email or web address]: Lab Results  Component Value Date   WBC 16.8 (H) 11/10/2019   HGB 11.2 (L) 11/10/2019   HCT 35.9 (L) 11/10/2019   MCV 79.4 (L) 11/10/2019   PLT 461 (H) 11/10/2019    Assessment / Plan:  Nina Phillips is a 29 y.o. 34 at [redacted]w[redacted]d here for IOL for IUFD  #Induction of Labor for IUFD:  - cytotec dose#5 administered,   - Desires autopsy. Desires chaplin and support services.              - last dose of heparin was on Tuesday 10/12  #DM2: On metformin, lantus 24 units, 4 units aspart at dinner PTA. Last dose of lantus was 10/15 at 9PM  -will hold lantus for now given minimal PO intake, latest CBG 81  -continue CBG ACHS with SSI. Hold metformin.   #Pain:  meds prn     06-18-1974 11/11/2019

## 2019-11-13 LAB — SURGICAL PATHOLOGY

## 2019-11-19 ENCOUNTER — Telehealth: Payer: Self-pay | Admitting: Family Medicine

## 2019-11-19 NOTE — Telephone Encounter (Signed)
Patient want to speak to someone regarding the pain she is having

## 2019-11-20 NOTE — Telephone Encounter (Signed)
Called pt about the pain that she is having. Pt reports that she had an epidural and since has had severe back pain.  I advised pt that she should take the ibuprofen to help and that it should lessen over time.  I advised pt that if she continues to have questions or concerns to please give the office a call. Pt verbalized understanding with no further questions.   Nina Naegeli, RN 11/20/19

## 2019-11-20 NOTE — BH Specialist Note (Signed)
error 

## 2019-11-26 ENCOUNTER — Telehealth: Payer: Self-pay | Admitting: Lactation Services

## 2019-11-26 NOTE — Telephone Encounter (Signed)
Patient called and LM that she wanted to get there results of baby's Autopsy. Ralene Bathe, RN has previously messaged patient to inform her we are still awaiting the results.

## 2019-11-28 ENCOUNTER — Ambulatory Visit: Payer: Self-pay | Admitting: Clinical

## 2019-11-28 DIAGNOSIS — F4321 Adjustment disorder with depressed mood: Secondary | ICD-10-CM

## 2019-11-28 NOTE — BH Specialist Note (Signed)
Integrated Behavioral Health via Telemedicine Video (Caregility) Visit  11/28/2019 Nina Phillips [id number]   I reviewed patient visit with the [facility] Intern, and I concur with the treatment plan, as documented in the [facility] Intern note.   No charge for this visit due to [facility] Intern seeing patient.  Hulda Marin, MSW, LCSW Integrated Behavioral Health Clinician Center for [employer] at [facility]   Number of Integrated Behavioral Health visits: 1 Session Start time: 2:15pm  Session End time: 2:42pm  Total time: 20 minutes  Referring Provider: Tinnie Gens, MD Type of Service: Individual,  Patient/Family location: home [facility] Provider location: [facility] All persons participating in visit: [facility] intern), pt   I connected with Nina Phillips and/or Nina Phillips patient by a video enabled telemedicine application (Caregility) and verified that I am speaking with the correct person using two identifiers.   Discussed confidentiality: Yes   Confirmed demographics & insurance:  Yes   I discussed that engaging in this virtual visit, they consent to the provision of behavioral healthcare and the services will be billed under their insurance.   Patient and/or legal guardian expressed understanding and consented to virtual visit: Yes   PRESENTING CONCERNS: Patient and/or family reports the following symptoms/concerns: anxiety, depression, grief  Duration of problem: ongoing; Severity of problem: severe   .phq STRENGTHS (Protective Factors/Coping Skills): Social connections and Parental Resilience  ASSESSMENT: Patient currently experiencing grief associated with the loss of her son. Pt is experiencing symptoms of anxiety and depression.    GOALS ADDRESSED: Patient will: 1.  Reduce symptoms of: grief  2.  Increase knowledge and/or ability of: coping skills and healthy habits  3.  Demonstrate ability to: Increase healthy adjustment to current life  circumstances and Begin healthy grieving over loss   Progress of Goals: Ongoing  INTERVENTIONS: Interventions utilized:  Solution-Focused Strategies, Mindfulness or Relaxation Training and Sleep Hygiene Standardized Assessments completed & reviewed: GAD-7 and PHQ 9   OUTCOME: Patient Response: Pt stated understanding of goals and expressed understanding and adherence to treatment plan.  PLAN: 1. Follow up with behavioral health clinician on : 12/06/2019 2. Behavioral recommendations: practice grounding techniques, journaling, and continue utilizing existing supports. 3. Referral(s): Integrated [employer] (In Clinic)  I discussed the assessment and treatment plan with the patient and/or parent/guardian. They were provided an opportunity to ask questions and all were answered. They agreed with the plan and demonstrated an understanding of the instructions.   They were advised to call back or seek an in-person evaluation as appropriate.  I discussed that the purpose of this visit is to provide behavioral health care while limiting exposure to the novel coronavirus.  Discussed there is a possibility of technology failure and discussed alternative modes of communication if that failure occurs.  Darcel Smalling   These are resources that have been helpful to women and families experiencing grief:   [place] virtual bereavement support group will be facilitated by pastoral care and perinatal education.  To start, this group will meet once a month. The registration location for this support group is on [facility] website > your wellbeing > classes and support groups > support groups  [facility] (Individual and group grief support)   [facility]  4185769453   Also:  [email or web address]  www.[email or web address]  www.[email or web address]  www.nilmdts.org        www.[email or web address]  www.rtzhope.org  Www.[email or web address].net

## 2019-12-03 ENCOUNTER — Encounter: Payer: Self-pay | Admitting: *Deleted

## 2019-12-06 ENCOUNTER — Ambulatory Visit: Payer: 59 | Admitting: Clinical

## 2019-12-06 DIAGNOSIS — F4321 Adjustment disorder with depressed mood: Secondary | ICD-10-CM

## 2019-12-06 NOTE — BH Specialist Note (Signed)
Integrated Behavioral Health via Telemedicine Video (Caregility) Visit  12/06/2019 Nina Phillips [id number]   I reviewed patient visit with the [facility] Intern, and I concur with the treatment plan, as documented in the [facility] Intern note.   No charge for this visit due to [facility] Intern seeing patient.  Nina Phillips, MSW, LCSW Integrated Behavioral Health Clinician Center for [employer] at [facility] for Women   Number of Integrated Behavioral Health visits: 2 Session Start time: 3:45 Session End time: 4:01pm  Total time: 16 minutes  Referring Provider: Tinnie Gens, MD Type of Service: Individual Patient/Family location: home [facility] Provider location: [facility] All persons participating in visit: Nina Phillips, [facility] Intern Nina Phillips   I connected with Nina Phillips and/or Nina Phillips's patient by a video enabled telemedicine application (Caregility) and verified that I am speaking with the correct person using two identifiers.   I reviewed patient visit with the [facility] Intern, and I concur with the treatment plan, as documented in the [facility] Intern note.   No charge for this visit due to [facility] Intern seeing patient.  Nina Phillips, MSW, LCSW Integrated Behavioral Health Clinician Center for [facility] at [facility] for Women  Discussed confidentiality: Yes   Confirmed demographics & insurance:  Yes   I discussed that engaging in this virtual visit, they consent to the provision of behavioral healthcare and the services will be billed under their insurance.   Patient and/or legal guardian expressed understanding and consented to virtual visit: Yes   PRESENTING CONCERNS: Patient and/or family reports the following symptoms/concerns: anxiety, depression, insomnia Duration of problem: ongoing; Severity of problem: moderate  STRENGTHS (Protective Factors/Coping Skills): Social and Emotional competence and Physical Health (exercise, healthy diet,  medication compliance, etc.)   ASSESSMENT: Patient currently experiencing Pt is currently experiencing some insomnia as well as symptoms of anxiety and depression. Pt is demonstrating healthy coping skills.     GOALS ADDRESSED: Patient will: 1.  Reduce symptoms of: anxiety, depression and insomnia  2.  Increase knowledge and/or ability of: coping skills and healthy habits  3.  Demonstrate ability to: Begin healthy grieving over loss   Progress of Goals: Ongoing  INTERVENTIONS: Interventions utilized:  Solution-Focused Strategies, Mindfulness or Relaxation Training and Sleep Hygiene Standardized Assessments completed & reviewed: Not Needed   OUTCOME: Patient Response: Pt states she is seeing improvement in her anxiety and depression symptoms but is still experiencing sleep disturbance. Pt agrees with treatment plan and will continue to utilize the coping skills she has learned.    PLAN: 1. Follow up with behavioral health clinician on : 12/13/2019 @ 2:45pm 2. Behavioral recommendations: Journaling, sleep hygeine, and grounding techniques 3. Referral(s): Integrated [employer] (In Clinic)  I discussed the assessment and treatment plan with the patient and/or parent/guardian. They were provided an opportunity to ask questions and all were answered. They agreed with the plan and demonstrated an understanding of the instructions.   They were advised to call back or seek an in-person evaluation as appropriate.  I discussed that the purpose of this visit is to provide behavioral health care while limiting exposure to the novel coronavirus.  Discussed there is a possibility of technology failure and discussed alternative modes of communication if that failure occurs.  Nina Phillips

## 2019-12-13 ENCOUNTER — Ambulatory Visit: Payer: 59 | Admitting: Clinical

## 2019-12-13 DIAGNOSIS — F4321 Adjustment disorder with depressed mood: Secondary | ICD-10-CM

## 2019-12-13 NOTE — BH Specialist Note (Signed)
Integrated Behavioral Health via Telemedicine Video (Caregility) Visit  12/13/2019 Nina Phillips [id number]  I reviewed patient visit with the [facility] Intern, and I concur with the treatment plan, as documented in the [facility] Intern note.   No charge for this visit due to [facility] Intern seeing patient.  Hulda Marin, MSW, LCSW Integrated Behavioral Health Clinician Center for [employer] at [facility] for Women Number of Integrated Behavioral Health visits: 3 Session Start time: 2:45pm  Session End time: 3:08pm Total time: 20 minutes  Referring Provider: Marcy Siren, DO Type of Service: Individual Patient/Family location: Home [facility] Provider location: [facility] All persons participating in visit: [name], [facility] Intern Darcel Smalling   I connected with Nina Phillips and/or Nina Phillips's patient by a video enabled telemedicine application (Caregility) and verified that I am speaking with the correct person using two identifiers.   Discussed confidentiality: Yes   Confirmed demographics & insurance:  Yes   I discussed that engaging in this virtual visit, they consent to the provision of behavioral healthcare and the services will be billed under their insurance.   Patient and/or legal guardian expressed understanding and consented to virtual visit: Yes   PRESENTING CONCERNS: Patient and/or family reports the following symptoms/concerns: grief, depression Duration of problem: ongoing; Severity of problem: mild  STRENGTHS (Protective Factors/Coping Skills): Concrete supports in place (healthy food, safe environments, etc.), Sense of purpose and Physical Health (exercise, healthy diet, medication compliance, etc.)  ASSESSMENT: Patient currently experiencing symptoms of grief associated with loss of fetus.    GOALS ADDRESSED: Patient will: 1.  Reduce symptoms of: grief  2.  Increase knowledge and/or ability of: coping skills and healthy habits  3.   Demonstrate ability to: Increase healthy adjustment to current life circumstances and Begin healthy grieving over loss   Progress of Goals: Ongoing  INTERVENTIONS: Interventions utilized:  Motivational Interviewing, Mindfulness or Management consultant and Psychoeducation and/or Health Education Standardized Assessments completed & reviewed: GAD-7 and PHQ 9   OUTCOME: Patient Response: Pt is adhering to treatment plan well. Pt agrees and shows understanding to additional steps added to treatment plan today.   PLAN: 1. Follow up with behavioral health clinician on : 12/27/19 @ 9:45am 2. Behavioral recommendations: continue journaling and identifying stages of grief when symptoms arise. Continue to maintain supportive relationship with FOB. 3. Referral(s): Integrated [employer] (In Clinic)  I discussed the assessment and treatment plan with the patient and/or parent/guardian. They were provided an opportunity to ask questions and all were answered. They agreed with the plan and demonstrated an understanding of the instructions.   They were advised to call back or seek an in-person evaluation as appropriate.  I discussed that the purpose of this visit is to provide behavioral health care while limiting exposure to the novel coronavirus.  Discussed there is a possibility of technology failure and discussed alternative modes of communication if that failure occurs.  Darcel Smalling Supervisor: Hulda Marin

## 2019-12-26 ENCOUNTER — Encounter: Payer: Self-pay | Admitting: Student

## 2019-12-26 ENCOUNTER — Ambulatory Visit (INDEPENDENT_AMBULATORY_CARE_PROVIDER_SITE_OTHER): Payer: 59 | Admitting: Student

## 2019-12-26 ENCOUNTER — Other Ambulatory Visit (HOSPITAL_COMMUNITY)
Admission: RE | Admit: 2019-12-26 | Discharge: 2019-12-26 | Disposition: A | Discharge status: Home / Self Care | Payer: 59 | Source: Ambulatory Visit | Attending: Student | Admitting: Student

## 2019-12-26 ENCOUNTER — Other Ambulatory Visit: Payer: Self-pay

## 2019-12-26 VITALS — BP 121/88 | HR 93 | Wt 259.2 lb

## 2019-12-26 DIAGNOSIS — N96 Recurrent pregnancy loss: Secondary | ICD-10-CM

## 2019-12-26 NOTE — Progress Notes (Unsigned)
Spoke with pathology, who says that report should be finished next week. Called patient and explained that to her, and also confirmed that opatient will keep MRI appointment. Front desk at [facility] has been notified that she needs appt with MD for sometime after Dec 10 to talk about results. Patient thanked me for the update and coordination of care.

## 2019-12-26 NOTE — Progress Notes (Signed)
MRI scheduled for 01/04/20 at [facility]; pt to arrive at 1330. MyChart message sent with instructions.  Fleet Contras RN 12/26/19

## 2019-12-26 NOTE — Progress Notes (Addendum)
Post Partum Visit Note  Nina Phillips is a 29 y.o. [id number] female who presents for a postpartum visit. She is 6 weeks 3 days postpartum following a normal spontaneous vaginal delivery.  I have fully reviewed the prenatal and intrapartum course. The delivery was at 21w 2d gestational weeks.  Anesthesia: none. Postpartum course has been uuneventful.  Bleeding staining only. Bowel function is normal. Bladder function is normal. Patient is sexually active. Contraception method is none. Postpartum depression screening: positive. 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 She would like an MRI to investigate that.  Patient reports that she is very upset not to receive the results of her baby's autopsy. She states that she is relieved that the genetic testing was normal.  The pregnancy intention screening data noted above was reviewed. Potential methods of contraception were discussed. The patient elected to proceed with No Method - No Contraceptive Precautions.    Edinburgh Postnatal Depression Scale - 12/26/19 0828      Edinburgh Postnatal Depression Scale:  In the Past 7 Days   I have been able to laugh and see the funny side of things. 0    I have looked forward with enjoyment to things. 0    I have blamed myself unnecessarily when things went wrong. 2    I have been anxious or worried for no good reason. 2    I have felt scared or panicky for no good reason. 0    Things have been getting on top of me. 2    I have been so unhappy that I have had difficulty sleeping. 2    I have felt sad or miserable. 2    I have been so unhappy that I have been crying. 2    The thought of harming myself has occurred to me. 0    Edinburgh Postnatal Depression Scale Total 12            The following portions of the patient's history were reviewed and updated as appropriate: allergies, current medications, past family history, past medical history, past  social history, past surgical history and problem list.  Review of Systems Pertinent items are noted in HPI.    Objective:  BP 121/88   Pulse 93   Wt 259 lb 3.2 oz (117.6 kg)   LMP 12/21/2019   Breastfeeding No   BMI 45.92 kg/m    General:  alert, cooperative and no distress   Breasts:  inspection negative, no nipple discharge or bleeding, no masses or nodularity palpable  Lungs: clear to auscultation bilaterally and normal percussion bilaterally  Heart:  regular rate and rhythm, S1, S2 normal, no murmur, click, rub or gallop  Abdomen: soft, non-tender; bowel sounds normal; no masses,  no organomegaly   Vulva:  normal  Vagina: normal vagina, no discharge, exudate, lesion, or erythema  Cervix:  retroverted  Corpus: normal  Adnexa:  not evaluated  Rectal Exam: Not performed.        Assessment:    Healthy postpartum exam. Pap smear done at today's visit.   Plan:   Essential components of care per ACOG recommendations:  1.  Mood and well being: Patient with positive postpartum depression screening today. Reviewed local resources for support. Patient has appointment to see Nina Phillips tomorrow. We reviewed genetic testing again, and I called pathology to require into autopsy. Nina Phillips  In pathology says she will follow up and call back; PA  Nina Phillips is also assigned to her case and I will follow up with PA personally.   - Patient does not use tobacco.- hx of drug use? No    -Social determinants of health (SDOH) reviewed in EPIC. No concerns  3. Sexuality, contraception and birth spacing - Patient does want a pregnancy in the next year.  Desired family size is 2 children.  - Reviewed forms of contraception in tiered fashion. Patient desired no method today.   - Discussed birth spacing of 18 months  Reviewed records from MFM [name] and MFM notes a possible uterine septum on [name] with recommendation for MRI. Will order MRI and then have patient see MD provider to determine best plan of  action.   4. Sleep and fatigue   5. Physical Recovery  - Discussed patients delivery  - Patient had a no degree laceration, perineal healing reviewed. Patient expressed understanding - Patient has urinary incontinence? No  Patient was referred to pelvic floor PT  - Patient is safe to resume physical and sexual activity  6.  Health Maintenance - Last pap smear done 04/11/17 and was normal with no HPV testing.   7. Chronic Disease - Patient reports elevated BPs at clinic because she is nervous but it is always normal at home. She takes her lantus and metformin. SHe sees Dr. Lonzo Phillips for her diabeteis. She denies having hypertension.    Nina Phillips, CNM Center for [employer], [facility]

## 2019-12-27 ENCOUNTER — Ambulatory Visit: Payer: 59 | Admitting: Clinical

## 2019-12-27 NOTE — BH Specialist Note (Signed)
I reviewed patient visit with the [facility] Intern, and I concur with the treatment plan, as documented in the [facility] Intern note.   No charge for this visit due to [facility] Intern seeing patient.  Called pt regarding this morning's appointment. Pt needed to reschedule. Pt was informed that intern would not be available, but pt has been scheduled with intern's supervisor for next week.   Darcel Smalling (Supervisor Jamie McMannes)  Hulda Marin, MSW, LCSW Integrated Behavioral Health Clinician Center for [facility] at [facility] for Women

## 2019-12-28 LAB — CYTOLOGY - PAP: Diagnosis: NEGATIVE

## 2019-12-31 NOTE — BH Specialist Note (Signed)
Integrated Behavioral Health via Telemedicine Visit  12/25/2019 Nina Phillips [id number]  Pt did not arrive to video visit and did not answer the phone ; Left HIPPA-compliant message to call back Nina Phillips from Center for [employer] at [facility] for Women at [phone] (main office) or [phone] (Veralyn Lopp's office).  ; left Nina Phillips message for patient.    [name] [name]

## 2020-01-03 ENCOUNTER — Other Ambulatory Visit: Payer: Self-pay

## 2020-01-03 ENCOUNTER — Ambulatory Visit: Payer: 59 | Admitting: Clinical

## 2020-01-03 DIAGNOSIS — Z91199 Patient's noncompliance with other medical treatment and regimen due to unspecified reason: Secondary | ICD-10-CM

## 2020-01-03 DIAGNOSIS — Z5329 Procedure and treatment not carried out because of patient's decision for other reasons: Secondary | ICD-10-CM

## 2020-01-04 ENCOUNTER — Ambulatory Visit (HOSPITAL_COMMUNITY): Admission: RE | Admit: 2020-01-04 | Payer: 59 | Source: Ambulatory Visit

## 2020-01-14 ENCOUNTER — Ambulatory Visit (HOSPITAL_COMMUNITY): Payer: 59

## 2020-01-14 ENCOUNTER — Telehealth: Payer: Self-pay | Admitting: *Deleted

## 2020-01-14 NOTE — Telephone Encounter (Signed)
Spoke with patient via phone to give rescheduled MRI appointment for 01/15/20.  Arrive at 1630 at [facility].  Patient states understanding.    Preauthorization request emailed to [email or web address].

## 2020-01-15 ENCOUNTER — Ambulatory Visit (HOSPITAL_COMMUNITY): Payer: 59

## 2020-01-16 ENCOUNTER — Encounter: Payer: Self-pay | Admitting: *Deleted

## 2020-01-21 ENCOUNTER — Encounter (HOSPITAL_COMMUNITY): Payer: Self-pay

## 2020-01-21 ENCOUNTER — Ambulatory Visit (HOSPITAL_COMMUNITY): Admission: RE | Admit: 2020-01-21 | Payer: 59 | Source: Ambulatory Visit

## 2020-01-21 ENCOUNTER — Ambulatory Visit: Payer: Self-pay

## 2020-01-22 ENCOUNTER — Ambulatory Visit: Payer: 59 | Admitting: Family Medicine

## 2020-01-22 ENCOUNTER — Ambulatory Visit: Payer: 59 | Admitting: Obstetrics and Gynecology

## 2020-01-22 ENCOUNTER — Telehealth: Payer: Self-pay

## 2020-01-22 ENCOUNTER — Other Ambulatory Visit: Payer: 59

## 2020-01-22 NOTE — Telephone Encounter (Signed)
Called pt; VM left stating pt may call to reschedule appt. Pt should be rescheduled for MRI followed by a visit with provider to discuss results.

## 2020-01-28 ENCOUNTER — Ambulatory Visit (HOSPITAL_COMMUNITY): Admission: RE | Admit: 2020-01-28 | Payer: 59 | Source: Ambulatory Visit

## 2020-01-30 ENCOUNTER — Ambulatory Visit: Payer: 59 | Admitting: Family Medicine

## 2020-02-24 ENCOUNTER — Encounter: Payer: Self-pay | Admitting: Emergency Medicine

## 2020-02-24 ENCOUNTER — Ambulatory Visit
Admission: EM | Admit: 2020-02-24 | Discharge: 2020-02-24 | Disposition: A | Discharge status: Home / Self Care | Payer: 59 | Attending: Emergency Medicine | Admitting: Emergency Medicine

## 2020-02-24 ENCOUNTER — Other Ambulatory Visit: Payer: Self-pay

## 2020-02-24 DIAGNOSIS — R200 Anesthesia of skin: Secondary | ICD-10-CM | POA: Diagnosis not present

## 2020-02-24 DIAGNOSIS — M7989 Other specified soft tissue disorders: Secondary | ICD-10-CM

## 2020-02-24 DIAGNOSIS — M79641 Pain in right hand: Secondary | ICD-10-CM

## 2020-02-24 MED ORDER — GABAPENTIN 100 MG PO CAPS: 100.0000 mg | ORAL_CAPSULE | Freq: Three times a day (TID) | ORAL | 0 refills | Status: DC

## 2020-02-24 MED ORDER — ACETAMINOPHEN 500 MG PO TABS: 500.0000 mg | ORAL_TABLET | Freq: Four times a day (QID) | ORAL | 0 refills | Status: DC | PRN

## 2020-02-24 MED ORDER — DEXAMETHASONE SODIUM PHOSPHATE 10 MG/ML IJ SOLN
10.0000 mg | Freq: Once | INTRAMUSCULAR | Status: AC
  Administered 2020-02-24: 10 mg via INTRAMUSCULAR

## 2020-02-24 NOTE — Discharge Instructions (Addendum)
Tylenol was prescribed/take as directed Gabapentin was prescribed for numbness Decadron IM was given in office Follow-up with PCP Follow RICE instruction that is attached Return or go to ED if you develop any new or worsening of your symptoms

## 2020-02-24 NOTE — ED Triage Notes (Signed)
RT hand pain, swelling and numbness that started yesterday. Denies any injury.  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

## 2020-02-24 NOTE — ED Provider Notes (Addendum)
[facility]   [id number] 02/24/20 Arrival Time: 0911   Chief Complaint  Patient presents with  . hand swelling     SUBJECTIVE: History from: patient.  Nina Phillips is a 30 y.o. female who presented to the urgent care with a complaint of right hand pain, swelling numbness that started yesterday.  Denies any precipitating event, trauma or injury..  She localized pain, swelling and numbness to the right hand.  She describes the pain as constant and achy.  She has tried OTC medications without relief.  Her symptoms are made worse with ROM.  Reports similar symptoms in the past that improved with steroid injection.  Denies chills, fever, nausea, vomiting, diarrhea  ROS: As per HPI.  All other pertinent ROS negative     Past Medical History:  Diagnosis Date  . Abnormal uterine bleeding (AUB) 07/09/2019  . Anemia   . Anxiety   . Bicornate uterus   . Depression    seeing a therapist since 3rd miscarriage, helping, doing good.  . Diabetes mellitus without complication (HCC)    Type 2  . Gout   . History of miscarriage   . Ovarian cyst   . Trichomonas infection 08/24/2018   Treated 08/17/18   Past Surgical History:  Procedure Laterality Date  . DILATION AND CURETTAGE OF UTERUS    . WISDOM TOOTH EXTRACTION     Allergies  Allergen Reactions  . Apple Fruit Extract Hives  . Banana Hives  . Other Hives    Walnuts   . Shellfish Allergy Hives  . Tomato Hives  . Watermelon [Citrullus Vulgaris] Hives  . Bactrim [Sulfamethoxazole-Trimethoprim] Hives and Rash  . Sulfa Antibiotics Hives and Rash   No current facility-administered medications on file prior to encounter.   Current Outpatient Medications on File Prior to Encounter  Medication Sig Dispense Refill  . Continuous Blood Gluc Receiver (FREESTYLE LIBRE 2 READER) DEVI 1 Device by Does not apply route as directed. 1 each 0  . Continuous Blood Gluc Sensor (FREESTYLE LIBRE 2 SENSOR) MISC 1 Device by Does not apply  route as directed. 2 each 11  . ibuprofen (ADVIL) 600 MG tablet Take 1 tablet (600 mg total) by mouth every 8 (eight) hours as needed. (Patient not taking: Reported on 12/26/2019)    . insulin glargine (LANTUS SOLOSTAR) 100 UNIT/ML Solostar Pen Inject 20 Units into the skin daily. 30 mL 2  . Insulin Pen Needle 31G X 5 MM MISC 1 Device by Does not apply route in the morning, at noon, in the evening, and at bedtime. 100 each 3  . metFORMIN (GLUCOPHAGE) 500 MG tablet Take 1 tablet (500 mg total) by mouth 2 (two) times daily with a meal. 180 tablet 3  . Prenatal Vit-Fe Fumarate-FA (PRENATAL VITAMIN) 27-0.8 MG TABS Take 1 tablet by mouth daily.    . [DISCONTINUED] diphenhydrAMINE (BENADRYL) 25 mg capsule Take 25 mg by mouth every 6 (six) hours as needed for allergies (and allergic reactions).      Social History   Socioeconomic History  . Marital status: Single    Spouse name: Not on file  . Number of children: Not on file  . Years of education: Not on file  . Highest education level: Not on file  Occupational History  . Not on file  Tobacco Use  . Smoking status: Former Games developer  . Smokeless tobacco: Never Used  . Tobacco comment: quit 2014  Vaping Use  . Vaping Use: Never used  Substance and Sexual  Activity  . Alcohol use: No    Comment: occas  . Drug use: No  . Sexual activity: Yes    Birth control/protection: Implant    Comment: taken out on 07/20/19  Other Topics Concern  . Not on file  Social History Narrative  . Not on file   Social Determinants of Health   Financial Resource Strain: Not on file  Food Insecurity: No Food Insecurity  . Worried About Programme researcher, broadcasting/film/video in the Last Year: Never true  . Ran Out of Food in the Last Year: Never true  Transportation Needs: No Transportation Needs  . Lack of Transportation (Medical): No  . Lack of Transportation (Non-Medical): No  Physical Activity: Not on file  Stress: Not on file  Social Connections: Not on file  Intimate  Partner Violence: Not on file   Family History  Problem Relation Age of Onset  . Cancer Other   . Hypertension Other   . Diabetes Other   . Hypertension Mother   . Gout Mother   . Diabetes Father   . Hypertension Father   . Stroke Father   . Hypertension Maternal Grandfather   . Heart attack Maternal Grandfather   . Stroke Paternal Grandmother   . Hypertension Maternal Aunt   . Cancer Maternal Grandmother        breast  . Diabetes Paternal Grandfather     OBJECTIVE:  Vitals:   02/24/20 0928 02/24/20 0930  BP:  (!) 163/96  Pulse:  79  Resp:  19  Temp:  98.5 F (36.9 C)  TempSrc:  Oral  SpO2:  99%  Weight: 259 lb (117.5 kg)   Height: 5\' 2"  (1.575 m)      Physical Exam Vitals and nursing note reviewed.  Constitutional:      General: She is not in acute distress.    Appearance: Normal appearance. She is normal weight. She is not ill-appearing, toxic-appearing or diaphoretic.  HENT:     Head: Normocephalic.  Cardiovascular:     Rate and Rhythm: Normal rate and regular rhythm.     Pulses: Normal pulses.     Heart sounds: Normal heart sounds. No murmur heard. No friction rub. No gallop.   Pulmonary:     Effort: Pulmonary effort is normal. No respiratory distress.     Breath sounds: Normal breath sounds. No stridor. No wheezing, rhonchi or rales.  Chest:     Chest wall: No tenderness.  Musculoskeletal:        General: Tenderness present.     Right hand: Swelling and tenderness present.     Comments: The right hand is with obvious deformity compared to the left hand.  Swelling, tenderness and numbness present.  There is no ecchymosis, open wound, lesion, surface trauma or warmth present.  Limited range of motion with pain.  Neurovascular status intact.  Neurological:     General: No focal deficit present.     Mental Status: She is alert and oriented to person, place, and time.     GCS: GCS eye subscore is 4. GCS verbal subscore is 5. GCS motor subscore is 6.      Cranial Nerves: Cranial nerves are intact. No cranial nerve deficit.     Sensory: Sensory deficit present.     Motor: Motor function is intact. No weakness.     Coordination: Coordination is intact. Coordination normal.     Gait: Gait is intact. Gait normal.     Deep Tendon Reflexes: Reflexes normal.  LABS:  No results found for this or any previous visit (from the past 24 hour(s)).   ASSESSMENT & PLAN:  1. Right hand pain   2. Swelling of right hand   3. Numbness of right hand     Meds ordered this encounter  Medications  . dexamethasone (DECADRON) injection 10 mg  . acetaminophen (TYLENOL) 500 MG tablet    Sig: Take 1 tablet (500 mg total) by mouth every 6 (six) hours as needed.    Dispense:  30 tablet    Refill:  0  . gabapentin (NEURONTIN) 100 MG capsule    Sig: Take 1 capsule (100 mg total) by mouth 3 (three) times daily.    Dispense:  60 capsule    Refill:  0   Patient stable at discharge was neuro exam is otherwise normal except numbness of right hand.  Discharge Instructions  Tylenol was prescribed/take as directed Gabapentin was prescribed for numbness Decadron IM was given in office Follow-up with PCP Follow RICE instruction that is attached Return or go to ED if you develop any new or worsening of your symptoms  Reviewed expectations re: course of current medical issues. Questions answered. Outlined signs and symptoms indicating need for more acute intervention. Patient verbalized understanding. After Visit Summary given.         Durward Parcel, FNP 02/24/20 0954    Durward Parcel, FNP 02/24/20 [phone]

## 2020-05-12 ENCOUNTER — Telehealth: Payer: Self-pay | Admitting: Lactation Services

## 2020-05-12 ENCOUNTER — Encounter: Payer: Self-pay | Admitting: Internal Medicine

## 2020-05-12 NOTE — Telephone Encounter (Signed)
Called Radiology Centralized Scheduling Department to re schedule MRI at patients request. MRI scheduled for will need updated PA as previous one is expired.   Scheduled for 4/27 at 3 pm at [facility] and go to [employer] on arrival.   Called patient to let her know of MRI. She did not answer and phone number not in service. Will answer My Chart message.

## 2020-05-21 ENCOUNTER — Ambulatory Visit (INDEPENDENT_AMBULATORY_CARE_PROVIDER_SITE_OTHER): Payer: 59 | Admitting: Internal Medicine

## 2020-05-21 ENCOUNTER — Encounter: Payer: Self-pay | Admitting: Internal Medicine

## 2020-05-21 ENCOUNTER — Other Ambulatory Visit: Payer: Self-pay

## 2020-05-21 ENCOUNTER — Ambulatory Visit (HOSPITAL_COMMUNITY): Payer: 59 | Attending: Student

## 2020-05-21 VITALS — BP 124/72 | HR 85 | Ht 62.0 in | Wt 272.5 lb

## 2020-05-21 DIAGNOSIS — E1165 Type 2 diabetes mellitus with hyperglycemia: Secondary | ICD-10-CM

## 2020-05-21 LAB — POCT GLUCOSE (DEVICE FOR HOME USE): POC Glucose: 192 mg/dl — AB (ref 70–99)

## 2020-05-21 LAB — POCT GLYCOSYLATED HEMOGLOBIN (HGB A1C): Hemoglobin A1C: 7.6 % — AB (ref 4.0–5.6)

## 2020-05-21 MED ORDER — INSULIN PEN NEEDLE 31G X 5 MM MISC
1.0000 | Freq: Every day | 3 refills | Status: DC
Start: 2020-05-21 — End: 2022-02-03

## 2020-05-21 MED ORDER — LANTUS SOLOSTAR 100 UNIT/ML ~~LOC~~ SOPN: 25.0000 [IU] | PEN_INJECTOR | Freq: Every day | SUBCUTANEOUS | 3 refills | Status: DC

## 2020-05-21 NOTE — Progress Notes (Signed)
Name: Nina Phillips  Age/ Sex: 30 y.o., female   MRN/ DOB: [id number], July 25, 1990     PCP: Arvilla Market, DO   Reason for Endocrinology Evaluation: Type 2 Diabetes Mellitus  Initial Endocrine Consultative Visit:  09/08/2018    PATIENT IDENTIFIER: Nina Phillips is a 30 y.o. female with a past medical history of T2DM. The patient has followed with Endocrinology clinic since 09/08/2018 for consultative assistance with management of her diabetes.  DIABETIC HISTORY:  [name] was diagnosed with T2 DM in 06/2018. Her hemoglobin A1c has ranged from 10.6% on her initial diagnosis.  On her initial visit to our clinic she had an A1c of 10.3%, she was on Lantus only, we started Metformin. Invokana caused yeast infection .   She had ~ 5 miscarriages in 2020, the last one was 10/2019   Lives with boyfriend and his grandmother  SUBJECTIVE:   During the last visit (10/31/2019): A1c 5.8 %. Continued Lantus and metformin and started prandial insulin as she was 20 weeks of gestation.   Today (05/21/2020): [name] is here for a follow up on diabetes. She recently restarted checking glucose. She neglected herself for a while after the loss.    Currently in therapy for IUFD   Denies nausea or diarrhea   HOME DIABETES REGIMEN:  Metformin 500 tablet,BID  Lantus 25 units daily       METER DOWNLOAD SUMMARY: 9/23-10/06/2019 Average Number Tests/Day = 1.4 Overall Mean FS Glucose = 80   BG Ranges: Low = 59 High = 104   Hypoglycemic Events/30 Days: BG < 50 = 0 Episodes of symptomatic severe hypoglycemia = 0       DIABETIC COMPLICATIONS: Microvascular complications:    Denies: CKD, Neuropathy, retinopathy    Macrovascular complications:    Denies: CAD, CVA, PVD   HISTORY:  Past Medical History:  Past Medical History:  Diagnosis Date  . Abnormal uterine bleeding (AUB) 07/09/2019  . Anemia   . Anxiety   . Bicornate uterus   . Depression    seeing a  therapist since 3rd miscarriage, helping, doing good.  . Diabetes mellitus without complication (HCC)    Type 2  . Gout   . History of miscarriage   . Ovarian cyst   . Trichomonas infection 08/24/2018   Treated 08/17/18   Past Surgical History:  Past Surgical History:  Procedure Laterality Date  . DILATION AND CURETTAGE OF UTERUS    . WISDOM TOOTH EXTRACTION      Social History:  reports that she has quit smoking. She has never used smokeless tobacco. She reports that she does not drink alcohol and does not use drugs. Family History:  Family History  Problem Relation Age of Onset  . Cancer Other   . Hypertension Other   . Diabetes Other   . Hypertension Mother   . Gout Mother   . Diabetes Father   . Hypertension Father   . Stroke Father   . Hypertension Maternal Grandfather   . Heart attack Maternal Grandfather   . Stroke Paternal Grandmother   . Hypertension Maternal Aunt   . Cancer Maternal Grandmother        breast  . Diabetes Paternal Grandfather      HOME MEDICATIONS: Allergies as of 05/21/2020      Reactions   Apple Fruit Extract Hives   Banana Hives   Other Hives   Walnuts   Shellfish Allergy Hives   Tomato Hives   Watermelon [citrullus Vulgaris]  Hives   Bactrim [sulfamethoxazole-trimethoprim] Hives, Rash   Sulfa Antibiotics Hives, Rash      Medication List       Accurate as of May 21, 2020  8:50 AM. If you have any questions, ask your nurse or doctor.        acetaminophen 500 MG tablet Commonly known as: TYLENOL Take 1 tablet (500 mg total) by mouth every 6 (six) hours as needed.   FreeStyle Libre 2 Reader Devi 1 Device by Does not apply route as directed.   FreeStyle Libre 2 Sensor Misc 1 Device by Does not apply route as directed.   gabapentin 100 MG capsule Commonly known as: Neurontin Take 1 capsule (100 mg total) by mouth 3 (three) times daily.   ibuprofen 600 MG tablet Commonly known as: ADVIL Take 1 tablet (600 mg total) by  mouth every 8 (eight) hours as needed.   Insulin Pen Needle 31G X 5 MM Misc 1 Device by Does not apply route in the morning, at noon, in the evening, and at bedtime.   Lantus SoloStar 100 UNIT/ML Solostar Pen Generic drug: insulin glargine Inject 20 Units into the skin daily.   metFORMIN 500 MG tablet Commonly known as: GLUCOPHAGE Take 1 tablet (500 mg total) by mouth 2 (two) times daily with a meal.   Prenatal Vitamin 27-0.8 MG Tabs Take 1 tablet by mouth daily.        OBJECTIVE:   Vital Signs: BP 124/72   Pulse 85   Ht 5\' 2"  (1.575 m)   Wt 272 lb 8 oz (123.6 kg)   LMP 05/01/2020   SpO2 98%   BMI 49.84 kg/m   Wt Readings from Last 3 Encounters:  05/21/20 272 lb 8 oz (123.6 kg)  02/24/20 259 lb (117.5 kg)  12/26/19 259 lb 3.2 oz (117.6 kg)     Exam: General: Pt appears well and is in NAD  Lungs: Clear with good BS bilat with no rales, rhonchi, or wheezes  Heart: RRR with normal S1 and S2 and no gallops; no murmurs; no rub  Extremities: No pretibial edema  Neuro: MS is good with appropriate affect, pt is alert and Ox3    DM foot exam: 05/21/2020    The skin of the feet is intact without sores or ulcerations. The pedal pulses are 2+ on right and 2+ on left. The sensation is intact to a screening 5.07, 10 gram monofilament bilaterally     DATA REVIEWED:  Lab Results  Component Value Date   HGBA1C 7.6 (A) 05/21/2020   HGBA1C 5.8 (A) 10/31/2019   HGBA1C 7.7 (H) 08/06/2019   Lab Results  Component Value Date   MICROALBUR 31.3 06/22/2019   LDLCALC 143 (H) 06/22/2019   CREATININE 0.89 08/06/2019    ASSESSMENT / PLAN / RECOMMENDATIONS:   1) Type 2 Diabetes Mellitus, Sub -Optimally Controlled, With microalbuminuria  - Most recent A1c of 7.6 %. Goal A1c < 6.5 %.    - She went through a severe grief period after having a fetal demise at 21 weeks of gestation, she did neglect herself at the time, currently going through therapy and has restarting caring for  herself and checking glucose  - No changes today      MEDICATIONS: - Continue Metformin 500 mg, twice a day - Continue Lantus 25  units daily    EDUCATION / INSTRUCTIONS:  BG monitoring instructions: Patient is instructed to check her blood sugars 1 times a day  Call [facility] Endocrinology  clinic if: BG persistently < 70  . I reviewed the Rule of 15 for the treatment of hypoglycemia in detail with the patient. Literature supplied.     2) Diabetic complications:   Eye: Does not have known diabetic retinopathy.   Neuro/ Feet: Does not have known diabetic peripheral neuropathy.  Renal: Patient does not have known baseline CKD. But has microalbuminuria  F/U in 4 months    Signed electronically by: Lyndle Herrlich, MD  [facility] Endocrinology  [facility] [address] [place] [place], [place] [address] Phone: [phone] FAX: [phone]   CC: Arvilla Market, DO [address] [place] [address] Phone: [phone]  Fax: [phone]  Return to Endocrinology clinic as below: Future Appointments  Date Time Provider Department Center  05/21/2020  3:00 PM [facility] [place]

## 2020-05-21 NOTE — Patient Instructions (Addendum)
-   Continue Metformin 500 mg, twice daily  - Continue   Lantus 25 units daily      HOW TO TREAT LOW BLOOD SUGARS (Blood sugar LESS THAN 60 MG/DL)  Please follow the RULE OF 15 for the treatment of hypoglycemia treatment (when your (blood sugars are less than 60 mg/dL)    STEP 1: Take 15 grams of carbohydrates when your blood sugar is low, which includes:   3-4 GLUCOSE TABS  OR  3-4 OZ OF JUICE OR REGULAR SODA OR  ONE TUBE OF GLUCOSE GEL     STEP 2: RECHECK blood sugar in 15 MINUTES STEP 3: If your blood sugar is still low at the 15 minute recheck --> then, go back to STEP 1 and treat AGAIN with another 15 grams of carbohydrates.

## 2020-07-29 ENCOUNTER — Other Ambulatory Visit: Payer: Self-pay

## 2020-07-29 ENCOUNTER — Ambulatory Visit
Admission: EM | Admit: 2020-07-29 | Discharge: 2020-07-29 | Disposition: A | Discharge status: Home / Self Care | Payer: 59 | Attending: Family Medicine | Admitting: Family Medicine

## 2020-07-29 DIAGNOSIS — M109 Gout, unspecified: Secondary | ICD-10-CM

## 2020-07-29 MED ORDER — INDOMETHACIN 50 MG PO CAPS: 50.0000 mg | ORAL_CAPSULE | Freq: Two times a day (BID) | ORAL | 0 refills | Status: AC

## 2020-07-29 MED ORDER — DEXAMETHASONE SODIUM PHOSPHATE 10 MG/ML IJ SOLN
10.0000 mg | Freq: Once | INTRAMUSCULAR | Status: AC
  Administered 2020-07-29: 11:00:00 10 mg via INTRAMUSCULAR

## 2020-07-29 NOTE — ED Provider Notes (Signed)
[facility]    CSN: [id number] Arrival date & time: 07/29/20  1007      History   Chief Complaint Chief Complaint  Patient presents with   Ankle Pain    HPI Nina Phillips is a 30 y.o. female.   HPI Patient presents today for evaluation of acute gout attack.  Reports initially experiencing mild pain at the right ankle yesterday.  Upon awakening this morning redness, swelling and increased pain with ambulation which is consistent with previous gout attacks.  Patient reports eating high purine foods during the recent holiday.  Past Medical History:  Diagnosis Date   Abnormal uterine bleeding (AUB) 07/09/2019   Anemia    Anxiety    Bicornate uterus    Depression    seeing a therapist since 3rd miscarriage, helping, doing good.   Diabetes mellitus without complication (HCC)    Type 2   Gout    History of miscarriage    Ovarian cyst    Trichomonas infection 08/24/2018   Treated 08/17/18    Patient Active Problem List   Diagnosis Date Noted   Vaginal delivery 11/12/2019   IUFD at 20 weeks or more of gestation 11/10/2019   Pre-existing diabetes mellitus affecting pregnancy in second trimester, antepartum 11/02/2019   Type 2 diabetes mellitus with hyperglycemia, without long-term current use of insulin (HCC) 11/02/2019   Elevated AFP 10/30/2019   Insufficient fetal fraction x 2 on Panorama 10/08/2019   BMI 40.0-44.9, adult (HCC) 08/27/2019   Obesity in pregnancy 08/27/2019   Chronic hypertension during pregnancy, antepartum 08/27/2019   Anticoagulant long-term use 08/27/2019   Pre-existing diabetes mellitus affecting pregnancy in first trimester, antepartum 08/13/2019   Type 2 diabetes mellitus with microalbuminuria, with long-term current use of insulin (HCC) 06/26/2019   GBS bacteriuria 08/20/2018   Bicornuate uterus 11/01/2017   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 Authorizing Provider  acetaminophen (TYLENOL) 500 MG tablet Take 1 tablet (500 mg total) by mouth every 6 (six) hours as needed. 02/24/20   Avegno, Zachery Dakins, FNP  Continuous Blood Gluc Receiver (FREESTYLE LIBRE 2 READER) DEVI 1 Device by Does not apply route as directed. Patient not taking: Reported on 05/21/2020 10/31/19   Shamleffer, Konrad Dolores, MD  Continuous Blood Gluc Sensor (FREESTYLE LIBRE 2 SENSOR) MISC 1 Device by Does not apply route as directed. Patient not taking: Reported on 05/21/2020 10/31/19   Shamleffer, Konrad Dolores, MD  gabapentin (NEURONTIN) 100 MG capsule Take 1 capsule (100 mg total) by mouth 3 (three) times daily. 02/24/20   Avegno, Zachery Dakins, FNP  ibuprofen (ADVIL) 600 MG tablet Take 1 tablet (600 mg total) by mouth every 8 (eight) hours as needed. 11/11/19   Sheila Oats, MD  insulin glargine (LANTUS  SOLOSTAR) 100 UNIT/ML Solostar Pen Inject 25 Units into the skin daily. 05/21/20   Shamleffer, Konrad Dolores, MD  Insulin Pen Needle 31G X 5 MM MISC 1 Device by Does not apply route daily. 05/21/20   Shamleffer, Konrad Dolores, MD  metFORMIN (GLUCOPHAGE) 500 MG tablet Take 1 tablet (500 mg total) by mouth 2 (two) times daily with a meal. 09/28/19   Shamleffer, Konrad Dolores, MD  Prenatal Vit-Fe Fumarate-FA (PRENATAL VITAMIN) 27-0.8 MG TABS Take 1 tablet by mouth daily.    [provider]  diphenhydrAMINE (BENADRYL) 25 mg capsule Take 25 mg by mouth every 6 (six) hours as needed for allergies (and allergic reactions).   12/20/18  [provider]    Family History Family History  Problem Relation  Age of Onset   Cancer Other    Hypertension Other    Diabetes Other    Hypertension Mother    Gout Mother    Diabetes Father    Hypertension Father    Stroke Father    Hypertension Maternal Grandfather    Heart attack Maternal Grandfather    Stroke Paternal Grandmother    Hypertension Maternal Aunt    Cancer Maternal Grandmother        breast   Diabetes Paternal Grandfather     Social History Social History   Tobacco Use   Smoking status: Former    Pack years: 0.00   Smokeless tobacco: Never   Tobacco comments:    quit 2014  Vaping Use   Vaping Use: Never used  Substance Use Topics   Alcohol use: No    Comment: occas   Drug use: No     Allergies   Apple fruit extract, Banana, Other, Shellfish allergy, Tomato, Watermelon [citrullus vulgaris], Bactrim [sulfamethoxazole-trimethoprim], and Sulfa antibiotics   Review of Systems Review of Systems Pertinent negatives listed in HPI    Physical Exam Triage Vital Signs ED Triage Vitals  Enc Vitals Group     BP 07/29/20 1017 123/84     Pulse Rate 07/29/20 1017 92     Resp 07/29/20 1017 16     Temp 07/29/20 1017 98.2 F (36.8 C)     Temp Source 07/29/20 1017 Tympanic     SpO2 07/29/20 1017 98 %     Weight --      Height --      Head Circumference --      Peak Flow --      Pain Score 07/29/20 1014 9     Pain Loc --      Pain Edu? --      Excl. in GC? --    No data found.  Updated Vital Signs BP 123/84 (BP Location: Right Arm)   Pulse 92   Temp 98.2 F (36.8 C) (Tympanic)   Resp 16   SpO2 98%   Visual Acuity Right Eye Distance:   Left Eye Distance:   Bilateral Distance:    Right Eye Near:   Left Eye Near:    Bilateral Near:     Physical Exam General appearance: alert, well developed, well nourished, cooperative  Head: Normocephalic, without obvious abnormality, atraumatic Respiratory: Respirations even and unlabored, normal respiratory rate Heart: Rate and rhythm normal. No gallop or murmurs  noted on exam  Extremities: Right ankle swelling (lateral malleolus) tenderness and erythema   No gross deformities Skin: Skin color, texture, turgor normal. No rashes seen  Psych: Appropriate mood and affect. UC Treatments / Results  Labs (all labs ordered  are listed, but only abnormal results are displayed) Labs Reviewed - No data to display  EKG   Radiology No results found.  Procedures Procedures (including critical care time)  Medications Ordered in UC Medications - No data to display  Initial Impression / Assessment and Plan / UC Course  I have reviewed the triage vital signs and the nursing notes.  Pertinent labs & imaging results that were available during my care of the patient were reviewed by me and considered in my medical decision making (see chart for details).    Acute gout flare of the right ankle treatment today with dexamethasone 10 mg IM.  Patient will continue treatment beginning tomorrow with indomethacin 50 mg twice daily for 7 days.  Return precautions given.  Encouraged to monitor blood sugar since treated with a one-time dose of dexamethasone.  Hydrate well with fluids.  Return precautions given.  Final Clinical Impressions(s) / UC Diagnoses   Final diagnoses:  Acute gout of right ankle, unspecified cause     Discharge Instructions      Start indomethacin with food tomorrow for 7 days.  You received a Decadron injection here in clinic.  Your sugar will acutely be higher than normal therefore drink plenty of water as this is just a result of the medication.    ED Prescriptions     Medication Sig Dispense Auth. Provider   indomethacin (INDOCIN) 50 MG capsule Take 1 capsule (50 mg total) by mouth 2 (two) times daily with a meal for 7 days. 14 capsule Bing Neighbors, FNP      PDMP not reviewed this encounter.   Bing Neighbors, FNP 07/29/20 [phone]

## 2020-07-29 NOTE — Discharge Instructions (Addendum)
Start indomethacin with food tomorrow for 7 days.  You received a Decadron injection here in clinic.  Your sugar will acutely be higher than normal therefore drink plenty of water as this is just a result of the medication.

## 2020-07-29 NOTE — ED Triage Notes (Signed)
Pt presents with c/o right ankle pain that began this morning, has h/o gout

## 2020-08-26 ENCOUNTER — Emergency Department (HOSPITAL_COMMUNITY): Payer: 59

## 2020-08-26 ENCOUNTER — Encounter (HOSPITAL_COMMUNITY): Payer: Self-pay | Admitting: *Deleted

## 2020-08-26 ENCOUNTER — Other Ambulatory Visit: Payer: Self-pay

## 2020-08-26 ENCOUNTER — Emergency Department (HOSPITAL_COMMUNITY)
Admission: EM | Admit: 2020-08-26 | Discharge: 2020-08-26 | Disposition: A | Discharge status: Home / Self Care | Payer: 59 | Attending: Emergency Medicine | Admitting: Emergency Medicine

## 2020-08-26 DIAGNOSIS — Z7984 Long term (current) use of oral hypoglycemic drugs: Secondary | ICD-10-CM | POA: Insufficient documentation

## 2020-08-26 DIAGNOSIS — R0789 Other chest pain: Secondary | ICD-10-CM | POA: Insufficient documentation

## 2020-08-26 DIAGNOSIS — Z20822 Contact with and (suspected) exposure to covid-19: Secondary | ICD-10-CM | POA: Diagnosis not present

## 2020-08-26 DIAGNOSIS — Z794 Long term (current) use of insulin: Secondary | ICD-10-CM | POA: Diagnosis not present

## 2020-08-26 DIAGNOSIS — R519 Headache, unspecified: Secondary | ICD-10-CM | POA: Diagnosis not present

## 2020-08-26 DIAGNOSIS — Z87891 Personal history of nicotine dependence: Secondary | ICD-10-CM | POA: Diagnosis not present

## 2020-08-26 DIAGNOSIS — R2243 Localized swelling, mass and lump, lower limb, bilateral: Secondary | ICD-10-CM | POA: Insufficient documentation

## 2020-08-26 DIAGNOSIS — E1165 Type 2 diabetes mellitus with hyperglycemia: Secondary | ICD-10-CM | POA: Insufficient documentation

## 2020-08-26 DIAGNOSIS — M7989 Other specified soft tissue disorders: Secondary | ICD-10-CM

## 2020-08-26 DIAGNOSIS — R600 Localized edema: Secondary | ICD-10-CM

## 2020-08-26 DIAGNOSIS — E1129 Type 2 diabetes mellitus with other diabetic kidney complication: Secondary | ICD-10-CM | POA: Diagnosis not present

## 2020-08-26 LAB — CBC
HCT: 31.8 % — ABNORMAL LOW (ref 36.0–46.0)
Hemoglobin: 9.7 g/dL — ABNORMAL LOW (ref 12.0–15.0)
MCH: 22.3 pg — ABNORMAL LOW (ref 26.0–34.0)
MCHC: 30.5 g/dL (ref 30.0–36.0)
MCV: 73.1 fL — ABNORMAL LOW (ref 80.0–100.0)
Platelets: 529 10*3/uL — ABNORMAL HIGH (ref 150–400)
RBC: 4.35 MIL/uL (ref 3.87–5.11)
RDW: 17.6 % — ABNORMAL HIGH (ref 11.5–15.5)
WBC: 10.5 10*3/uL (ref 4.0–10.5)
nRBC: 0 % (ref 0.0–0.2)

## 2020-08-26 LAB — BASIC METABOLIC PANEL
Anion gap: 5 (ref 5–15)
BUN: 14 mg/dL (ref 6–20)
CO2: 24 mmol/L (ref 22–32)
Calcium: 8.5 mg/dL — ABNORMAL LOW (ref 8.9–10.3)
Chloride: 107 mmol/L (ref 98–111)
Creatinine, Ser: 0.94 mg/dL (ref 0.44–1.00)
GFR, Estimated: >60 mL/min (ref >60)
Glucose, Bld: 100 mg/dL — ABNORMAL HIGH (ref 70–99)
Potassium: 3.8 mmol/L (ref 3.5–5.1)
Sodium: 136 mmol/L (ref 135–145)

## 2020-08-26 LAB — D-DIMER, QUANTITATIVE: D-Dimer, Quant: 0.53 ug/mL-FEU — ABNORMAL HIGH (ref 0.00–0.50)

## 2020-08-26 LAB — POC URINE PREG, ED: Preg Test, Ur: NEGATIVE

## 2020-08-26 LAB — TROPONIN I (HIGH SENSITIVITY)
Troponin I (High Sensitivity): 3 ng/L (ref <18)
Troponin I (High Sensitivity): 3 ng/L (ref <18)

## 2020-08-26 MED ORDER — PROCHLORPERAZINE EDISYLATE 10 MG/2ML IJ SOLN
5.0000 mg | Freq: Once | INTRAMUSCULAR | Status: AC
  Administered 2020-08-26: 5 mg via INTRAVENOUS
  Filled 2020-08-26: qty 2

## 2020-08-26 MED ORDER — IOHEXOL 350 MG/ML SOLN
100.0000 mL | Freq: Once | INTRAVENOUS | Status: AC | PRN
  Administered 2020-08-26: 100 mL via INTRAVENOUS

## 2020-08-26 MED ORDER — SODIUM CHLORIDE 0.9 % IV BOLUS
1000.0000 mL | Freq: Once | INTRAVENOUS | Status: AC
  Administered 2020-08-26: 1000 mL via INTRAVENOUS

## 2020-08-26 NOTE — ED Triage Notes (Signed)
Chest pain, headache, swelling of legs ,patient has multiple complaints

## 2020-08-26 NOTE — ED Provider Notes (Signed)
Emergency Medicine Provider Triage Evaluation Note  Nina Phillips , a 30 y.o. female  was evaluated in triage.  Pt complains of chest pain, headache, leg swelling.  Leg swelling she woke up with today, states she started having headache around 9 AM.  She started having a chest heaviness that has been constant since lunch.  No history of MI or blood clots.    Review of Systems  Positive: Chest pressure, headache, leg swelling  Negative: No nausea or vomiting.Marland Kitchen  Physical Exam  BP (!) 159/94 (BP Location: Right Arm)   Pulse 77   Temp 98.6 F (37 C) (Oral)   Resp 20   LMP 08/20/2020   SpO2 100%  Gen:   Awake, no distress   Resp:  Normal effort  MSK:   Moves extremities without difficulty  Other:    Medical Decision Making  Medically screening exam initiated at 3:07 PM.  Appropriate orders placed.  Ensley Blas was informed that the remainder of the evaluation will be completed by another provider, this initial triage assessment does not replace that evaluation, and the importance of remaining in the ED until their evaluation is complete.     Theron Arista, PA-C 08/26/20 1507    Gerhard Munch, MD 08/26/20 [phone]

## 2020-08-26 NOTE — Discharge Instructions (Signed)
As discussed, today's evaluation has been generally reassuring.  There is no evidence for changes in your heart's blood flow, nor blood clots.  There is no evidence for pneumonia or other obvious infection.  Your COVID test is pending, however. In addition, given your description of lower extremity swelling is important you return tomorrow for ultrasound to exclude the possibility of blood clots in your legs.  Stay well rested, monitor your condition carefully and do not hesitate to return here for changes in your condition.

## 2020-08-26 NOTE — ED Provider Notes (Signed)
[facility] EMERGENCY DEPARTMENT Provider Note   CSN: [id number] Arrival date & time: 08/26/20  1428     History Chief Complaint  Patient presents with   Chest Pain    Nina Phillips is a 30 y.o. female.  HPI Young female with history of diabetes, gestational hypertension now presents with chest tightness, headache, leg swelling.  She was in her usual state of health yesterday, but upon awakening today noticed leg swelling, right greater than left, and soon thereafter chest tightness, and headache.  No syncope, no confusion, no fall.  She has had associated blurry vision, though no vision loss.  Upon checking her blood pressure he found it to be elevated beyond baseline, and is here for evaluation. No medication taken for relief, and discomfort is largely centered around headache and chest tightness.    Past Medical History:  Diagnosis Date   Abnormal uterine bleeding (AUB) 07/09/2019   Anemia    Anxiety    Bicornate uterus    Depression    seeing a therapist since 3rd miscarriage, helping, doing good.   Diabetes mellitus without complication (HCC)    Type 2   Gout    History of miscarriage    Ovarian cyst    Trichomonas infection 08/24/2018   Treated 08/17/18    Patient Active Problem List   Diagnosis Date Noted   Vaginal delivery 11/12/2019   IUFD at 20 weeks or more of gestation 11/10/2019   Pre-existing diabetes mellitus affecting pregnancy in second trimester, antepartum 11/02/2019   Type 2 diabetes mellitus with hyperglycemia, without long-term current use of insulin (HCC) 11/02/2019   Elevated AFP 10/30/2019   Insufficient fetal fraction x 2 on Panorama 10/08/2019   BMI 40.0-44.9, adult (HCC) 08/27/2019   Obesity in pregnancy 08/27/2019   Chronic hypertension during pregnancy, antepartum 08/27/2019   Anticoagulant long-term use 08/27/2019   Pre-existing diabetes mellitus affecting pregnancy in first trimester, antepartum 08/13/2019   Type 2 diabetes mellitus  with microalbuminuria, with long-term current use of insulin (HCC) 06/26/2019   GBS bacteriuria 08/20/2018   Bicornuate uterus 11/01/2017   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    Past Surgical History:  Procedure Laterality Date   DILATION AND CURETTAGE OF UTERUS     WISDOM TOOTH EXTRACTION       OB History     Gravida  5   Para  1   Term      Preterm  1   AB  4   Living  0      SAB  4   IAB      Ectopic      Multiple  0   Live Births              Family History  Problem Relation Age of Onset   Cancer Other    Hypertension Other    Diabetes Other    Hypertension Mother    Gout Mother    Diabetes Father    Hypertension Father    Stroke Father    Hypertension Maternal Grandfather    Heart attack Maternal Grandfather    Stroke Paternal Grandmother    Hypertension Maternal Aunt    Cancer Maternal Grandmother        breast   Diabetes Paternal Grandfather     Social History   Tobacco Use   Smoking status: Former   Smokeless tobacco: Never   Tobacco comments:    quit 2014  Vaping Use  Vaping Use: Never used  Substance Use Topics   Alcohol use: No    Comment: occas   Drug use: No    Home Medications Prior to Admission medications   Medication Sig Start Date End Date Taking? Authorizing Provider  acetaminophen (TYLENOL) 500 MG tablet Take 1 tablet (500 mg total) by mouth every 6 (six) hours as needed. 02/24/20   Avegno, Zachery Dakins, FNP  Continuous Blood Gluc Receiver (FREESTYLE LIBRE 2 READER) DEVI 1 Device by Does not apply route as directed. Patient not taking: Reported on 05/21/2020 10/31/19   Shamleffer, Konrad Dolores, MD  Continuous Blood Gluc Sensor (FREESTYLE LIBRE 2 SENSOR) MISC 1 Device by Does not apply route as directed. Patient not taking: Reported on 05/21/2020 10/31/19   Shamleffer, Konrad Dolores, MD  gabapentin (NEURONTIN) 100 MG capsule Take 1 capsule (100 mg total) by mouth 3 (three)  times daily. 02/24/20   Avegno, Zachery Dakins, FNP  ibuprofen (ADVIL) 600 MG tablet Take 1 tablet (600 mg total) by mouth every 8 (eight) hours as needed. 11/11/19   Sheila Oats, MD  insulin glargine (LANTUS SOLOSTAR) 100 UNIT/ML Solostar Pen Inject 25 Units into the skin daily. 05/21/20   Shamleffer, Konrad Dolores, MD  Insulin Pen Needle 31G X 5 MM MISC 1 Device by Does not apply route daily. 05/21/20   Shamleffer, Konrad Dolores, MD  metFORMIN (GLUCOPHAGE) 500 MG tablet Take 1 tablet (500 mg total) by mouth 2 (two) times daily with a meal. 09/28/19   Shamleffer, Konrad Dolores, MD  Prenatal Vit-Fe Fumarate-FA (PRENATAL VITAMIN) 27-0.8 MG TABS Take 1 tablet by mouth daily.    [provider]  diphenhydrAMINE (BENADRYL) 25 mg capsule Take 25 mg by mouth every 6 (six) hours as needed for allergies (and allergic reactions).   12/20/18  [provider]    Allergies    Apple fruit extract, Banana, Other, Shellfish allergy, Tomato, Watermelon [citrullus vulgaris], Bactrim [sulfamethoxazole-trimethoprim], and Sulfa antibiotics  Review of Systems   Review of Systems  Constitutional:        Per HPI, otherwise negative  HENT:         Per HPI, otherwise negative  Respiratory:         Per HPI, otherwise negative  Cardiovascular:        Per HPI, otherwise negative  Gastrointestinal:  Negative for vomiting.  Endocrine:       Negative aside from HPI  Genitourinary:        Neg aside from HPI   Musculoskeletal:        Per HPI, otherwise negative  Skin: Negative.   Neurological:  Negative for syncope.   Physical Exam Updated Vital Signs BP 138/71   Pulse 75   Temp 98.6 F (37 C) (Oral)   Resp 14   LMP 08/20/2020   SpO2 100%   Physical Exam Vitals and nursing note reviewed.  Constitutional:      General: She is not in acute distress.    Appearance: She is well-developed. She is obese.  HENT:     Head: Normocephalic and atraumatic.  Eyes:     Conjunctiva/sclera:  Conjunctivae normal.  Cardiovascular:     Rate and Rhythm: Normal rate and regular rhythm.  Pulmonary:     Effort: Pulmonary effort is normal. No respiratory distress.     Breath sounds: Normal breath sounds. No stridor.  Abdominal:     General: There is no distension.  Musculoskeletal:     Right lower leg: Edema present.  Left lower leg: Edema present.  Skin:    General: Skin is warm and dry.  Neurological:     General: No focal deficit present.     Mental Status: She is alert and oriented to person, place, and time.     Cranial Nerves: No cranial nerve deficit.    ED Results / Procedures / Treatments   Labs (all labs ordered are listed, but only abnormal results are displayed) Labs Reviewed  BASIC METABOLIC PANEL - Abnormal; Notable for the following components:      Result Value   Glucose, Bld 100 (*)    Calcium 8.5 (*)    All other components within normal limits  CBC - Abnormal; Notable for the following components:   Hemoglobin 9.7 (*)    HCT 31.8 (*)    MCV 73.1 (*)    MCH 22.3 (*)    RDW 17.6 (*)    Platelets 529 (*)    All other components within normal limits  D-DIMER, QUANTITATIVE - Abnormal; Notable for the following components:   D-Dimer, Quant 0.53 (*)    All other components within normal limits  SARS CORONAVIRUS 2 (TAT 6-24 HRS)  POC URINE PREG, ED  TROPONIN I (HIGH SENSITIVITY)  TROPONIN I (HIGH SENSITIVITY)    EKG EKG Interpretation  Date/Time:  Tuesday August 26 2020 14:54:05 EDT Ventricular Rate:  87 PR Interval:  140 QRS Duration: 76 QT Interval:  350 QTC Calculation: 421 R Axis:   27 Text Interpretation: Normal sinus rhythm with sinus arrhythmia Nonspecific ST abnormality Abnormal ECG Confirmed by Gerhard Munch [phone]) on 08/26/2020 4:28:31 PM  Radiology DG Chest 2 View  Result Date: 08/26/2020 CLINICAL DATA:  Chest pain and shortness of breath. EXAM: CHEST - 2 VIEW COMPARISON:  None. FINDINGS: The heart size and mediastinal contours  are within normal limits. Both lungs are clear. No evidence of pneumothorax or pleural effusion. The visualized skeletal structures are unremarkable. IMPRESSION: Negative. No active cardiopulmonary disease. Electronically Signed   By: Danae Orleans M.D.   On: 08/26/2020 15:37   CT Angio Chest PE W/Cm &/Or Wo Cm  Result Date: 08/26/2020 CLINICAL DATA:  PC suspected. Low/intermediate probability. Positive D-dimer. Chest pain. Swelling of the legs. EXAM: CT ANGIOGRAPHY CHEST WITH CONTRAST TECHNIQUE: Multidetector CT imaging of the chest was performed using the standard protocol during bolus administration of intravenous contrast. Multiplanar CT image reconstructions and MIPs were obtained to evaluate the vascular anatomy. CONTRAST:  OMNIPAQUE IOHEXOL 350 MG/ML SOLN COMPARISON:  Chest radiography same day FINDINGS: Cardiovascular: Heart size upper limits of normal. No pericardial fluid. No coronary artery calcification. No aortic atherosclerotic calcification. Pulmonary arterial opacification is moderate. There are no visible pulmonary emboli. The study excludes large or central emboli. Small peripheral emboli might be difficult to see. Mediastinum/Nodes: Normal. Lungs/Pleura: The lungs are clear. No infiltrate, collapse, mass or nodule. Upper Abdomen: Negative Musculoskeletal: Normal Review of the MIP images confirms the above findings. IMPRESSION: Negative examination. No visible pulmonary emboli. Pulmonary arterial opacification is only moderate. The study clearly excludes large or central emboli. No small or peripheral emboli are seen, though sensitivity may be diminished. The lungs are clear and there is no sign of any pathology however. Electronically Signed   By: Paulina Fusi M.D.   On: 08/26/2020 19:46    Procedures Procedures   Medications Ordered in ED Medications  sodium chloride 0.9 % bolus 1,000 mL (1,000 mLs Intravenous New Bag/Given 08/26/20 1746)  prochlorperazine (COMPAZINE) injection 5 mg  (  5 mg Intravenous Given 08/26/20 1753)  iohexol (OMNIPAQUE) 350 MG/ML injection 100 mL (100 mLs Intravenous Contrast Given 08/26/20 1933)    ED Course  I have reviewed the triage vital signs and the nursing notes.  Pertinent labs & imaging results that were available during my care of the patient were reviewed by me and considered in my medical decision making (see chart for details).  Cardiac 80s sinus normal Pulse ox 99% room air normal  9:24 PM Patient in no distress, headache has improved, she remains hemodynamically unremarkable, now with resolution of her tachypnea as well.  No new oxygen requirement.  CT scan reviewed, discussed, no evidence for pulmonary embolism. Patient's findings generally reassuring, but patient will have to return tomorrow for ultrasound to exclude DVT lower extremities, and COVID test is pending on discharge.  MDM Rules/Calculators/A&P MDM Number of Diagnoses or Management Options Atypical chest pain: new, needed workup Bad headache: new, needed workup Swelling of lower extremity: new, needed workup   Amount and/or Complexity of Data Reviewed Clinical lab tests: ordered and reviewed Tests in the radiology section of CPT: ordered and reviewed Tests in the medicine section of CPT: reviewed and ordered Decide to obtain previous medical records or to obtain history from someone other than the patient: yes Review and summarize past medical records: yes Independent visualization of images, tracings, or specimens: yes  Risk of Complications, Morbidity, and/or Mortality Presenting problems: high Diagnostic procedures: high Management options: high  Critical Care Total time providing critical care: < 30 minutes  Patient Progress Patient progress: improved   Final Clinical Impression(s) / ED Diagnoses Final diagnoses:  Atypical chest pain  Swelling of lower extremity  Bad headache    Rx / DC Orders ED Discharge Orders     None         Gerhard MunchLockwood, Amr Sturtevant, MD 08/26/20 2127

## 2020-08-27 ENCOUNTER — Ambulatory Visit (HOSPITAL_COMMUNITY)
Admission: RE | Admit: 2020-08-27 | Discharge: 2020-08-27 | Disposition: A | Discharge status: Home / Self Care | Payer: 59 | Source: Ambulatory Visit | Attending: Emergency Medicine | Admitting: Emergency Medicine

## 2020-08-27 DIAGNOSIS — R600 Localized edema: Secondary | ICD-10-CM | POA: Insufficient documentation

## 2020-08-27 LAB — SARS CORONAVIRUS 2 (TAT 6-24 HRS): SARS Coronavirus 2: NEGATIVE

## 2020-10-01 ENCOUNTER — Ambulatory Visit (INDEPENDENT_AMBULATORY_CARE_PROVIDER_SITE_OTHER): Payer: 59 | Admitting: Internal Medicine

## 2020-10-01 ENCOUNTER — Other Ambulatory Visit: Payer: Self-pay

## 2020-10-01 ENCOUNTER — Encounter: Payer: Self-pay | Admitting: Internal Medicine

## 2020-10-01 VITALS — BP 122/80 | HR 88 | Ht 62.0 in | Wt 276.8 lb

## 2020-10-01 DIAGNOSIS — E1165 Type 2 diabetes mellitus with hyperglycemia: Secondary | ICD-10-CM | POA: Diagnosis not present

## 2020-10-01 DIAGNOSIS — E785 Hyperlipidemia, unspecified: Secondary | ICD-10-CM | POA: Diagnosis not present

## 2020-10-01 DIAGNOSIS — Z6841 Body Mass Index (BMI) 40.0 and over, adult: Secondary | ICD-10-CM | POA: Insufficient documentation

## 2020-10-01 DIAGNOSIS — E559 Vitamin D deficiency, unspecified: Secondary | ICD-10-CM

## 2020-10-01 LAB — POCT GLYCOSYLATED HEMOGLOBIN (HGB A1C): Hemoglobin A1C: 11 % — AB (ref 4.0–5.6)

## 2020-10-01 LAB — LIPID PANEL
Cholesterol: 189 mg/dL (ref 0–200)
HDL: 50.5 mg/dL (ref >39.00)
LDL Cholesterol: 117 mg/dL — ABNORMAL HIGH (ref 0–99)
NonHDL: 138.05
Total CHOL/HDL Ratio: 4
Triglycerides: 105 mg/dL (ref 0.0–149.0)
VLDL: 21 mg/dL (ref 0.0–40.0)

## 2020-10-01 LAB — VITAMIN B12: Vitamin B-12: 404 pg/mL (ref 211–911)

## 2020-10-01 LAB — GLUCOSE, POCT (MANUAL RESULT ENTRY): POC Glucose: 209 mg/dl — AB (ref 70–99)

## 2020-10-01 LAB — TSH: TSH: 3.36 u[IU]/mL (ref 0.35–5.50)

## 2020-10-01 LAB — MICROALBUMIN / CREATININE URINE RATIO
Creatinine,U: 195.8 mg/dL
Microalb Creat Ratio: 35.2 mg/g — ABNORMAL HIGH (ref 0.0–30.0)
Microalb, Ur: 68.9 mg/dL — ABNORMAL HIGH (ref 0.0–1.9)

## 2020-10-01 LAB — VITAMIN D 25 HYDROXY (VIT D DEFICIENCY, FRACTURES): VITD: 11.51 ng/mL — ABNORMAL LOW (ref 30.00–100.00)

## 2020-10-01 LAB — T4, FREE: Free T4: 0.87 ng/dL (ref 0.60–1.60)

## 2020-10-01 MED ORDER — LANTUS SOLOSTAR 100 UNIT/ML ~~LOC~~ SOPN: 30.0000 [IU] | PEN_INJECTOR | Freq: Every day | SUBCUTANEOUS | 3 refills | Status: DC

## 2020-10-01 MED ORDER — ERGOCALCIFEROL 1.25 MG (50000 UT) PO CAPS: 50000.0000 [IU] | ORAL_CAPSULE | ORAL | 1 refills | Status: DC

## 2020-10-01 MED ORDER — METFORMIN HCL ER 500 MG PO TB24: 1000.0000 mg | ORAL_TABLET | Freq: Two times a day (BID) | ORAL | 3 refills | Status: DC

## 2020-10-01 NOTE — Progress Notes (Addendum)
Name: Nina Phillips  Age/ Sex: 30 y.o., female   MRN/ DOB: [id number], [birth year]     PCP: Arvilla Market, MD   Reason for Endocrinology Evaluation: Type 2 Diabetes Mellitus  Initial Endocrine Consultative Visit:  09/08/2018    PATIENT IDENTIFIER: Nina Phillips is a 30 y.o. female with a past medical history of T2DM. The patient has followed with Endocrinology clinic since 09/08/2018 for consultative assistance with management of her diabetes.  DIABETIC HISTORY:  Nina Phillips was diagnosed with T2 DM in 06/2018. Her hemoglobin A1c has ranged from 10.6% on her initial diagnosis.  On her initial visit to our clinic she had an A1c of 10.3%, she was on Lantus only, we started Metformin. Invokana caused yeast infection .   She had ~ 5 miscarriages in 2020, and again in 2021. Had fetal demise at 21 weeks  in 2022   Lives with boyfriend and his grandmother  SUBJECTIVE:   During the last visit (05/21/2020): A1c 7.6 %. Continued Lantus and metformin    Today (10/01/2020): Nina Phillips is here for a follow up on diabetes.   She has not been taking metformin , and admits to dietary indiscretions  She is upset about her grandmother passing  LMP last week , she is not sexually active   She has not been checking glucose  Denies nausea or diarrhea   HOME DIABETES REGIMEN:  Metformin 500 tablet,BID  Lantus 25 units daily       METER DOWNLOAD SUMMARY:did not bring        DIABETIC COMPLICATIONS: Microvascular complications:   Denies: CKD, Neuropathy, retinopathy    Macrovascular complications:   Denies: CAD, CVA, PVD   HISTORY:  Past Medical History:  Past Medical History:  Diagnosis Date   Abnormal uterine bleeding (AUB) 07/09/2019   Anemia    Anxiety    Bicornate uterus    Depression    seeing a therapist since 3rd miscarriage, helping, doing good.   Diabetes mellitus without complication (HCC)    Type 2   Gout    History of miscarriage    Ovarian  cyst    Trichomonas infection 08/24/2018   Treated 08/17/18   Past Surgical History:  Past Surgical History:  Procedure Laterality Date   DILATION AND CURETTAGE OF UTERUS     WISDOM TOOTH EXTRACTION     Social History:  reports that she has quit smoking. She has never used smokeless tobacco. She reports that she does not drink alcohol and does not use drugs. Family History:  Family History  Problem Relation Age of Onset   Cancer Other    Hypertension Other    Diabetes Other    Hypertension Mother    Gout Mother    Diabetes Father    Hypertension Father    Stroke Father    Hypertension Maternal Grandfather    Heart attack Maternal Grandfather    Stroke Paternal Grandmother    Hypertension Maternal Aunt    Cancer Maternal Grandmother        breast   Diabetes Paternal Grandfather      HOME MEDICATIONS: Allergies as of 10/01/2020       Reactions   Apple Fruit Extract Hives   Banana Hives   Other Hives   Walnuts   Shellfish Allergy Hives   Tomato Hives   Watermelon [citrullus Vulgaris] Hives   Bactrim [sulfamethoxazole-trimethoprim] Hives, Rash   Sulfa Antibiotics Hives, Rash        Medication List  Accurate as of October 01, 2020  8:32 AM. If you have any questions, ask your nurse or doctor.          STOP taking these medications    metFORMIN 500 MG tablet Commonly known as: GLUCOPHAGE Replaced by: metFORMIN 500 MG 24 hr tablet Stopped by: Scarlette Shorts, MD       TAKE these medications    acetaminophen 500 MG tablet Commonly known as: TYLENOL Take 1 tablet (500 mg total) by mouth every 6 (six) hours as needed.   FreeStyle Libre 2 Reader Devi 1 Device by Does not apply route as directed.   FreeStyle Libre 2 Sensor Misc 1 Device by Does not apply route as directed.   gabapentin 100 MG capsule Commonly known as: Neurontin Take 1 capsule (100 mg total) by mouth 3 (three) times daily.   ibuprofen 600 MG tablet Commonly known as:  ADVIL Take 1 tablet (600 mg total) by mouth every 8 (eight) hours as needed.   Insulin Pen Needle 31G X 5 MM Misc 1 Device by Does not apply route daily.   Lantus SoloStar 100 UNIT/ML Solostar Pen Generic drug: insulin glargine Inject 30 Units into the skin daily. What changed: how much to take Changed by: Scarlette Shorts, MD   metFORMIN 500 MG 24 hr tablet Commonly known as: GLUCOPHAGE-XR Take 2 tablets (1,000 mg total) by mouth in the morning and at bedtime. Replaces: metFORMIN 500 MG tablet Started by: Scarlette Shorts, MD   Prenatal Vitamin 27-0.8 MG Tabs Take 1 tablet by mouth daily.         OBJECTIVE:   Vital Signs: BP 122/80 (BP Location: Right Arm, Patient Position: Sitting, Cuff Size: Large)   Pulse 88   Ht 5\' 2"  (1.575 m)   Wt 276 lb 12.8 oz (125.6 kg)   SpO2 99%   BMI 50.63 kg/m   Wt Readings from Last 3 Encounters:  10/01/20 276 lb 12.8 oz (125.6 kg)  05/21/20 272 lb 8 oz (123.6 kg)  02/24/20 259 lb (117.5 kg)     Exam: General: Pt appears well and is in NAD  Lungs: Clear with good BS bilat with no rales, rhonchi, or wheezes  Heart: RRR with normal S1 and S2 and no gallops; no murmurs; no rub  Extremities: No pretibial edema  Neuro: MS is good with appropriate affect, pt is alert and Ox3    DM foot exam: 05/21/2020    The skin of the feet is intact without sores or ulcerations. The pedal pulses are 2+ on right and 2+ on left. The sensation is intact to a screening 5.07, 10 gram monofilament bilaterally     DATA REVIEWED:  Lab Results  Component Value Date   HGBA1C 11.0 (A) 10/01/2020   HGBA1C 7.6 (A) 05/21/2020   HGBA1C 5.8 (A) 10/31/2019   Results for 12/31/2019 [facility] (MRN [facility]) as of 10/01/2020 15:17  Ref. Range 10/01/2020 08:34  Total CHOL/HDL Ratio Unknown 4  Cholesterol Latest Ref Range: 0 - 200 mg/dL 12/01/2020  HDL Cholesterol Latest Ref Range: >39.00 mg/dL 283  LDL (calc) Latest Ref Range: 0 - 99 mg/dL 66.29 (H)   MICROALB/CREAT RATIO Latest Ref Range: 0.0 - 30.0 mg/g 35.2 (H)  NonHDL Unknown 138.05  Triglycerides Latest Ref Range: 0.0 - 149.0 mg/dL 476  VLDL Latest Ref Range: 0.0 - 40.0 mg/dL 546.5  VITD Latest Ref Range: 30.00 - 100.00 ng/mL 11.51 (L)  Vitamin B12 Latest Ref Range: 211 - 911 pg/mL 404  TSH Latest Ref Range: 0.35 - 5.50 uIU/mL 3.36  T4,Free(Direct) Latest Ref Range: 0.60 - 1.60 ng/dL 9.38  Creatinine,U Latest Units: mg/dL 182.9  Microalb, Ur Latest Ref Range: 0.0 - 1.9 mg/dL 93.7 (H)   Results for [facility] (MRN [id number]) as of 10/01/2020 08:26  Ref. Range 08/26/2020 15:54  Sodium Latest Ref Range: 135 - 145 mmol/L 136  Potassium Latest Ref Range: 3.5 - 5.1 mmol/L 3.8  Chloride Latest Ref Range: 98 - 111 mmol/L 107  CO2 Latest Ref Range: 22 - 32 mmol/L 24  Glucose Latest Ref Range: 70 - 99 mg/dL 101 (H)  BUN Latest Ref Range: 6 - 20 mg/dL 14  Creatinine Latest Ref Range: 0.44 - 1.00 mg/dL 7.51  Calcium Latest Ref Range: 8.9 - 10.3 mg/dL 8.5 (L)  Anion gap Latest Ref Range: 5 - 15  5  GFR, Estimated Latest Ref Range: >60 mL/min >60   ASSESSMENT / PLAN / RECOMMENDATIONS:   1) Type 2 Diabetes Mellitus, Sub -Optimally Controlled, With microalbuminuria  - Most recent A1c of 11.0 %. Goal A1c < 6.5 %.      Her A1c is up from 7.6 %  - Her main barriers to diabetes care is depression. She recently lost her grandmother , she was the care taker, prior to that had a fetal demise. She had stopped taking metformin , she admits to dietary indiscretions during that time  - I am going to restart Metformin gradually and increase insulin as below  - Intolerant to SGLT-2 inhibitors due to recurrent yeast infection  - If glucose not controlled with metformin and basal insulin will consider GLP-1 agonist on next visit     MEDICATIONS: - Restart  Metformin 500 mg, 2 tabs with Breakfast and two tabs with supper  - Increase  Lantus to 30   units daily    EDUCATION / INSTRUCTIONS: BG  monitoring instructions: Patient is instructed to check her blood sugars 1 times a day Call [facility] if: BG persistently < 70  I reviewed the Rule of 15 for the treatment of hypoglycemia in detail with the patient. Literature supplied.     2) Diabetic complications:  Eye: Does not have known diabetic retinopathy.  Neuro/ Feet: Does not have known diabetic peripheral neuropathy. Renal: Patient does not have known baseline CKD.      3) Dyslipidemia :  - Lipids today show slight elevation of LDL, will encourage low-fat diet, no indication for statin therapy   4)Vitamin D Deficiency:   -We will replenish with ergocalciferol 50,000 IU weekly  5) Morbid Obesity, BMI 50 :   - Pt interested in discussing surgical weight loss options. Will refer to Bariatric surgery.    F/U in 4 months    Signed electronically by: Lyndle Herrlich, MD  [facility]  [facility] [address] [place], [place] [address] Phone: [phone] FAX: [phone]   CC: Arvilla Market, MD [address] [place] [address] Phone: [phone]  Fax: [phone]  Return to Endocrinology clinic as below: No future appointments.

## 2020-10-01 NOTE — Patient Instructions (Addendum)
-   Start Metformin 1 tablet daily with Breakfast for 1 week, then increase to 1 tablet with Breakfast and 1 tablet with Supper for  1 week, then increase to 2 tablets with Breakfast  and 1 tablet with Supper for another 1 week , then finally 2 Tablets with Breakfast and 2 tablets with Supper.   -Increase Lantus to 30 units once daily      HOW TO TREAT LOW BLOOD SUGARS (Blood sugar LESS THAN 70 MG/DL) Please follow the RULE OF 15 for the treatment of hypoglycemia treatment (when your (blood sugars are less than 70 mg/dL)   STEP 1: Take 15 grams of carbohydrates when your blood sugar is low, which includes:  3-4 GLUCOSE TABS  OR 3-4 OZ OF JUICE OR REGULAR SODA OR ONE TUBE OF GLUCOSE GEL    STEP 2: RECHECK blood sugar in 15 MINUTES STEP 3: If your blood sugar is still low at the 15 minute recheck --> then, go back to STEP 1 and treat AGAIN with another 15 grams of carbohydrates.

## 2020-10-02 DIAGNOSIS — E559 Vitamin D deficiency, unspecified: Secondary | ICD-10-CM | POA: Insufficient documentation

## 2020-10-02 HISTORY — DX: Vitamin D deficiency, unspecified: E55.9

## 2020-10-22 ENCOUNTER — Other Ambulatory Visit: Payer: Self-pay

## 2020-10-22 ENCOUNTER — Ambulatory Visit (INDEPENDENT_AMBULATORY_CARE_PROVIDER_SITE_OTHER): Payer: 59

## 2020-10-22 ENCOUNTER — Encounter: Payer: Self-pay | Admitting: Emergency Medicine

## 2020-10-22 ENCOUNTER — Ambulatory Visit
Admission: EM | Admit: 2020-10-22 | Discharge: 2020-10-22 | Disposition: A | Discharge status: Home / Self Care | Payer: 59 | Attending: Physician Assistant | Admitting: Physician Assistant

## 2020-10-22 DIAGNOSIS — R059 Cough, unspecified: Secondary | ICD-10-CM

## 2020-10-22 DIAGNOSIS — Z20822 Contact with and (suspected) exposure to covid-19: Secondary | ICD-10-CM

## 2020-10-22 DIAGNOSIS — R0602 Shortness of breath: Secondary | ICD-10-CM | POA: Diagnosis not present

## 2020-10-22 DIAGNOSIS — J189 Pneumonia, unspecified organism: Secondary | ICD-10-CM

## 2020-10-22 MED ORDER — ALBUTEROL SULFATE HFA 108 (90 BASE) MCG/ACT IN AERS
2.0000 | INHALATION_SPRAY | Freq: Once | RESPIRATORY_TRACT | Status: AC
  Administered 2020-10-22: 2 via RESPIRATORY_TRACT

## 2020-10-22 MED ORDER — CEFTRIAXONE SODIUM 1 G IJ SOLR
1.0000 g | Freq: Once | INTRAMUSCULAR | Status: AC
  Administered 2020-10-22: 1 g via INTRAMUSCULAR

## 2020-10-22 MED ORDER — DOXYCYCLINE HYCLATE 100 MG PO CAPS: 100.0000 mg | ORAL_CAPSULE | Freq: Two times a day (BID) | ORAL | 0 refills | Status: DC

## 2020-10-22 NOTE — Discharge Instructions (Addendum)
Return if any problems.

## 2020-10-22 NOTE — ED Triage Notes (Signed)
Cough and chest congestion states she gets SOB easily since Saturday.  Nose bleed yesterday.  Headache.  Covid test on Monday and Tuesday were negative.

## 2020-10-22 NOTE — ED Provider Notes (Signed)
[facility]    CSN: [id number] Arrival date & time: 10/22/20  0254      History   Chief Complaint No chief complaint on file.   HPI Nina Phillips is a 30 y.o. female.   The history is provided by the patient. No language interpreter was used.  Cough Cough characteristics:  Productive Sputum characteristics:  Nondescript Severity:  Moderate Onset quality:  Gradual Duration:  3 days Timing:  Constant Progression:  Worsening Chronicity:  New Smoker: no   Relieved by:  Nothing Worsened by:  Nothing Ineffective treatments:  None tried Associated symptoms: no fever   Risk factors: no recent infection    Past Medical History:  Diagnosis Date  . Abnormal uterine bleeding (AUB) 07/09/2019  . Anemia   . Anxiety   . Bicornate uterus   . Depression    seeing a therapist since 3rd miscarriage, helping, doing good.  . Diabetes mellitus without complication (HCC)    Type 2  . Gout   . History of miscarriage   . Ovarian cyst   . Trichomonas infection 08/24/2018   Treated 08/17/18    Patient Active Problem List   Diagnosis Date Noted  . Morbid obesity (HCC) 10/02/2020  . Vitamin D deficiency 10/02/2020  . BMI 50.0-59.9, adult (HCC) 10/01/2020  . Vaginal delivery 11/12/2019  . IUFD at 20 weeks or more of gestation 11/10/2019  . Pre-existing diabetes mellitus affecting pregnancy in second trimester, antepartum 11/02/2019  . Type 2 diabetes mellitus with hyperglycemia, without long-term current use of insulin (HCC) 11/02/2019  . Elevated AFP 10/30/2019  . Insufficient fetal fraction x 2 on Panorama 10/08/2019  . BMI 40.0-44.9, adult (HCC) 08/27/2019  . Obesity in pregnancy 08/27/2019  . Chronic hypertension during pregnancy, antepartum 08/27/2019  . Anticoagulant long-term use 08/27/2019  . Pre-existing diabetes mellitus affecting pregnancy in first trimester, antepartum 08/13/2019  . Type 2 diabetes mellitus with microalbuminuria, with long-term current use  of insulin (HCC) 06/26/2019  . GBS bacteriuria 08/20/2018  . 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 DILATION AND CURETTAGE OF UTERUS    . WISDOM TOOTH EXTRACTION      OB History     Gravida  5   Para  1   Term      Preterm  1   AB  4   Living  0      SAB  4   IAB      Ectopic      Multiple  0   Live Births               Home Medications    Prior to Admission medications   Medication Sig Start Date End Date Taking? Authorizing Provider  doxycycline (VIBRAMYCIN) 100 MG capsule Take 1 capsule (100 mg total) by mouth 2 (two) times daily. 10/22/20  Yes Elson Areas, PA-C  acetaminophen (TYLENOL) 500 MG tablet Take 1 tablet (500 mg total) by mouth every 6 (six) hours as needed. 02/24/20   Avegno, Zachery Dakins, FNP  Continuous Blood Gluc Receiver (FREESTYLE LIBRE 2 READER) DEVI 1 Device by Does not apply route as directed. 10/31/19   Shamleffer, Konrad Dolores, MD  Continuous Blood Gluc Sensor (FREESTYLE LIBRE 2 SENSOR) MISC 1 Device by Does not apply route as directed. 10/31/19   Shamleffer, Konrad Dolores, MD  ergocalciferol (VITAMIN D2) 1.25 MG (50000 UT) capsule  Take 1 capsule (50,000 Units total) by mouth once a week. 10/01/20   Shamleffer, Konrad Dolores, MD  gabapentin (NEURONTIN) 100 MG capsule Take 1 capsule (100 mg total) by mouth 3 (three) times daily. 02/24/20   Avegno, Zachery Dakins, FNP  ibuprofen (ADVIL) 600 MG tablet Take 1 tablet (600 mg total) by mouth every 8 (eight) hours as needed. 11/11/19   Sheila Oats, MD  insulin glargine (LANTUS SOLOSTAR) 100 UNIT/ML Solostar Pen Inject 30 Units into the skin daily. 10/01/20   Shamleffer, Konrad Dolores, MD  Insulin Pen Needle 31G X 5 MM MISC 1 Device by Does not apply route daily. 05/21/20   Shamleffer, Konrad Dolores, MD  metFORMIN (GLUCOPHAGE-XR) 500 MG 24 hr tablet Take 2 tablets  (1,000 mg total) by mouth in the morning and at bedtime. 10/01/20   Shamleffer, Konrad Dolores, MD  Prenatal Vit-Fe Fumarate-FA (PRENATAL VITAMIN) 27-0.8 MG TABS Take 1 tablet by mouth daily.    [provider]  diphenhydrAMINE (BENADRYL) 25 mg capsule Take 25 mg by mouth every 6 (six) hours as needed for allergies (and allergic reactions).   12/20/18  [provider]    Family History Family History  Problem Relation Age of Onset  . Cancer Other   . Hypertension Other   . Diabetes Other   . Hypertension Mother   . Gout Mother   . Diabetes Father   . Hypertension Father   . Stroke Father   . Hypertension Maternal Grandfather   . Heart attack Maternal Grandfather   . Stroke Paternal Grandmother   . Hypertension Maternal Aunt   . Cancer Maternal Grandmother        breast  . Diabetes Paternal Grandfather     Social History Social History   Tobacco Use  . Smoking status: Former  . Smokeless tobacco: Never  . Tobacco comments:    quit 2014  Vaping Use  . Vaping Use: Never used  Substance Use Topics  . Alcohol use: No    Comment: occas  . Drug use: No     Allergies   Apple fruit extract, Banana, Other, Shellfish allergy, Tomato, Watermelon [citrullus vulgaris], Bactrim [sulfamethoxazole-trimethoprim], and Sulfa antibiotics   Review of Systems Review of Systems  Constitutional:  Negative for fever.  Respiratory:  Positive for cough.   All other systems reviewed and are negative.   Physical Exam Triage Vital Signs ED Triage Vitals  Enc Vitals Group     BP 10/22/20 0836 (!) 149/91     Pulse Rate 10/22/20 0836 97     Resp 10/22/20 0836 20     Temp 10/22/20 0836 99.1 F (37.3 C)     Temp Source 10/22/20 0836 Oral     SpO2 10/22/20 0836 95 %     Weight --      Height --      Head Circumference --      Peak Flow --      Pain Score 10/22/20 0837 8     Pain Loc --      Pain Edu? --      Excl. in GC? --    No data found.  Updated Vital  Signs BP (!) 149/91 (BP Location: Right Arm)   Pulse 97   Temp 99.1 F (37.3 C) (Oral)   Resp 20   LMP 10/22/2020 (Exact Date)   SpO2 95%   Visual Acuity Right Eye Distance:   Left Eye Distance:   Bilateral Distance:    Right  Eye Near:   Left Eye Near:    Bilateral Near:     Physical Exam Vitals reviewed.  Constitutional:      Appearance: Normal appearance.  HENT:     Right Ear: Tympanic membrane normal.     Left Ear: Tympanic membrane normal.     Mouth/Throat:     Mouth: Mucous membranes are moist.  Cardiovascular:     Rate and Rhythm: Normal rate.  Pulmonary:     Breath sounds: Wheezing and rhonchi present.  Abdominal:     General: Abdomen is flat.  Musculoskeletal:        General: Normal range of motion.  Skin:    General: Skin is warm.  Neurological:     General: No focal deficit present.     Mental Status: She is alert.  Psychiatric:        Mood and Affect: Mood normal.     UC Treatments / Results  Labs (all labs ordered are listed, but only abnormal results are displayed) Labs Reviewed  COVID-19, FLU A+B NAA    EKG   Radiology DG Chest 2 View  Result Date: 10/22/2020 CLINICAL DATA:  30 year old female with history of cough and chest congestion. Shortness of breath. EXAM: CHEST - 2 VIEW COMPARISON:  Chest x-ray 08/26/2020. FINDINGS: Patchy ill-defined opacities and areas of interstitial prominence are noted in the mid to lower lungs bilaterally (left greater than right), new compared to the prior study. No pleural effusions. No pneumothorax. No evidence of pulmonary edema. Heart size is normal. Upper mediastinal contours are within normal limits. IMPRESSION: 1. Findings are concerning for developing multilobar bilateral bronchopneumonia, most severe in the left mid to lower lung. Electronically Signed   By: Trudie Reed M.D.   On: 10/22/2020 09:39    Procedures Procedures (including critical care time)  Medications Ordered in UC Medications   albuterol (VENTOLIN HFA) 108 (90 Base) MCG/ACT inhaler 2 puff (2 puffs Inhalation Given 10/22/20 0921)  cefTRIAXone (ROCEPHIN) injection 1 g (1 g Intramuscular Given 10/22/20 1014)    Initial Impression / Assessment and Plan / UC Course  I have reviewed the triage vital signs and the nursing notes.  Pertinent labs & imaging results that were available during my care of the patient were reviewed by me and considered in my medical decision making (see chart for details).     MDM:  chest xray shows pneumonia  Pt given albuterol 2 puffs.  Rocephn 1 gram.  Rx for doxycycline Final Clinical Impressions(s) / UC Diagnoses   Final diagnoses:  Exposure to COVID-19 virus  Pneumonia due to infectious organism, unspecified laterality, unspecified part of lung     Discharge Instructions      Return if any problems.    ED Prescriptions     Medication Sig Dispense Auth. Provider   doxycycline (VIBRAMYCIN) 100 MG capsule Take 1 capsule (100 mg total) by mouth 2 (two) times daily. 20 capsule Elson Areas, [place]      PDMP not reviewed this encounter. An After Visit Summary was printed and given to the patient.    Elson Areas, [place] 10/22/20 1018

## 2020-10-23 LAB — COVID-19, FLU A+B NAA
Influenza A, NAA: NOT DETECTED
Influenza B, NAA: NOT DETECTED
SARS-CoV-2, NAA: NOT DETECTED

## 2020-10-26 ENCOUNTER — Other Ambulatory Visit: Payer: Self-pay

## 2020-10-26 ENCOUNTER — Encounter: Payer: Self-pay | Admitting: Emergency Medicine

## 2020-10-26 ENCOUNTER — Ambulatory Visit
Admission: EM | Admit: 2020-10-26 | Discharge: 2020-10-26 | Disposition: A | Discharge status: Home / Self Care | Payer: 59 | Attending: Internal Medicine | Admitting: Internal Medicine

## 2020-10-26 DIAGNOSIS — M10072 Idiopathic gout, left ankle and foot: Secondary | ICD-10-CM | POA: Diagnosis not present

## 2020-10-26 MED ORDER — INDOMETHACIN 50 MG PO CAPS: 50.0000 mg | ORAL_CAPSULE | Freq: Two times a day (BID) | ORAL | 0 refills | Status: AC

## 2020-10-26 NOTE — Discharge Instructions (Addendum)
Gentle range of motion exercises Take medications as prescribed If you have worsening symptoms please return to the urgent care to be reevaluated.

## 2020-10-26 NOTE — ED Triage Notes (Signed)
Here for pnemonia on Wednesday and started on antibiotics.  Yesterday pain started in left ankle with possible gout flare up.

## 2020-10-26 NOTE — ED Provider Notes (Signed)
[facility]    CSN: [id number] Arrival date & time: 10/26/20  1404      History   Chief Complaint Chief Complaint  Patient presents with   Gout    HPI Nina Phillips is a 30 y.o. female with a history of acute hyperuricemic gout comes to urgent care with left ankle pain of 1 day duration.  Patient denies any trauma to the left ankle.  Patient was started on doxycycline for respiratory infection earlier this week otherwise no changes in medications.  Pain is throbbing, constant, aggravated by movement.  Patient tried ibuprofen with no improvement in symptoms.  No associated swelling or erythema of the left ankle.  No other areas of pain or tenderness.Marland Kitchen   HPI  Past Medical History:  Diagnosis Date   Abnormal uterine bleeding (AUB) 07/09/2019   Anemia    Anxiety    Bicornate uterus    Depression    seeing a therapist since 3rd miscarriage, helping, doing good.   Diabetes mellitus without complication (HCC)    Type 2   Gout    History of miscarriage    Ovarian cyst    Trichomonas infection 08/24/2018   Treated 08/17/18    Patient Active Problem List   Diagnosis Date Noted   Morbid obesity (HCC) 10/02/2020   Vitamin D deficiency 10/02/2020   BMI 50.0-59.9, adult (HCC) 10/01/2020   Vaginal delivery 11/12/2019   IUFD at 20 weeks or more of gestation 11/10/2019   Pre-existing diabetes mellitus affecting pregnancy in second trimester, antepartum 11/02/2019   Type 2 diabetes mellitus with hyperglycemia, without long-term current use of insulin (HCC) 11/02/2019   Elevated AFP 10/30/2019   Insufficient fetal fraction x 2 on Panorama 10/08/2019   BMI 40.0-44.9, adult (HCC) 08/27/2019   Obesity in pregnancy 08/27/2019   Chronic hypertension during pregnancy, antepartum 08/27/2019   Anticoagulant long-term use 08/27/2019   Pre-existing diabetes mellitus affecting pregnancy in first trimester, antepartum 08/13/2019   Type 2 diabetes mellitus with microalbuminuria,  with long-term current use of insulin (HCC) 06/26/2019   GBS bacteriuria 08/20/2018   Bicornuate uterus 11/01/2017   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 Authorizing Provider  indomethacin (INDOCIN) 50 MG capsule Take 1 capsule (50 mg total) by mouth 2 (two) times daily with a meal for 7 days. 10/26/20 11/02/20 Yes Jakhi Dishman, Britta Mccreedy, MD  acetaminophen (TYLENOL) 500 MG tablet Take 1 tablet (500 mg total) by mouth every 6 (six) hours as needed. 02/24/20   Avegno, Zachery Dakins, FNP  Continuous Blood Gluc Receiver (FREESTYLE LIBRE 2 READER) DEVI 1 Device by Does not apply route as directed. 10/31/19   Shamleffer, Konrad Dolores, MD  Continuous Blood Gluc Sensor (FREESTYLE LIBRE 2 SENSOR) MISC 1 Device by Does not  apply route as directed. 10/31/19   Shamleffer, Konrad Dolores, MD  doxycycline (VIBRAMYCIN) 100 MG capsule Take 1 capsule (100 mg total) by mouth 2 (two) times daily. 10/22/20   Elson Areas, PA-C  ergocalciferol (VITAMIN D2) 1.25 MG (50000 UT) capsule Take 1 capsule (50,000 Units total) by mouth once a week. 10/01/20   Shamleffer, Konrad Dolores, MD  gabapentin (NEURONTIN) 100 MG capsule Take 1 capsule (100 mg total) by mouth 3 (three) times daily. 02/24/20   Avegno, Zachery Dakins, FNP  ibuprofen (ADVIL) 600 MG tablet Take 1 tablet (600 mg total) by mouth every 8 (eight) hours as needed. 11/11/19   Sheila Oats, MD  insulin glargine (LANTUS SOLOSTAR) 100 UNIT/ML Solostar Pen Inject 30 Units into the skin daily. 10/01/20   Shamleffer, Konrad Dolores, MD   Insulin Pen Needle 31G X 5 MM MISC 1 Device by Does not apply route daily. 05/21/20   Shamleffer, Konrad Dolores, MD  metFORMIN (GLUCOPHAGE-XR) 500 MG 24 hr tablet Take 2 tablets (1,000 mg total) by mouth in the morning and at bedtime. 10/01/20   Shamleffer, Konrad Dolores, MD  Prenatal Vit-Fe Fumarate-FA (PRENATAL VITAMIN) 27-0.8 MG TABS Take 1 tablet by mouth daily.    [provider]  diphenhydrAMINE (BENADRYL) 25 mg capsule Take 25 mg by mouth every 6 (six) hours as needed for allergies (and allergic reactions).   12/20/18  [provider]    Family History Family History  Problem Relation Age of Onset   Cancer Other    Hypertension Other    Diabetes Other    Hypertension Mother    Gout Mother    Diabetes Father    Hypertension Father    Stroke Father    Hypertension Maternal Grandfather    Heart attack Maternal Grandfather    Stroke Paternal Grandmother    Hypertension Maternal Aunt    Cancer Maternal Grandmother        breast   Diabetes Paternal Grandfather     Social History Social History   Tobacco Use   Smoking status: Former   Smokeless tobacco: Never   Tobacco comments:    quit 2014  Vaping Use   Vaping Use: Never used  Substance Use Topics   Alcohol use: No    Comment: occas   Drug use: No     Allergies   Apple fruit extract, Banana, Other, Shellfish allergy, Tomato, Watermelon [citrullus vulgaris], Bactrim [sulfamethoxazole-trimethoprim], and Sulfa antibiotics   Review of Systems Review of Systems  Respiratory: Negative.    Gastrointestinal: Negative.   Musculoskeletal:  Positive for arthralgias. Negative for joint swelling, myalgias, neck pain and neck stiffness.  Neurological: Negative.     Physical Exam Triage Vital Signs ED Triage Vitals  Enc Vitals Group     BP 10/26/20 1526 (!) 160/87     Pulse Rate 10/26/20 1526 65     Resp 10/26/20 1526 16     Temp 10/26/20 1526 98.4 F (36.9 C)     Temp Source 10/26/20 1526 Oral      SpO2 10/26/20 1526 98 %     Weight 10/26/20 1529 279 lb (126.6 kg)     Height --      Head Circumference --      Peak Flow --      Pain Score 10/26/20 1529 10     Pain Loc --      Pain Edu? --      Excl. in GC? --    No data  found.  Updated Vital Signs BP (!) 160/87 (BP Location: Right Arm)   Pulse 65   Temp 98.4 F (36.9 C) (Oral)   Resp 16   Wt 126.6 kg   LMP 10/22/2020 (Exact Date)   SpO2 98%   BMI 51.03 kg/m   Visual Acuity Right Eye Distance:   Left Eye Distance:   Bilateral Distance:    Right Eye Near:   Left Eye Near:    Bilateral Near:     Physical Exam Vitals and nursing note reviewed.  Constitutional:      General: She is not in acute distress.    Appearance: She is not ill-appearing.  Cardiovascular:     Rate and Rhythm: Normal rate and regular rhythm.     Pulses: Normal pulses.     Heart sounds: Normal heart sounds.  Musculoskeletal:        General: Tenderness present. No swelling, deformity or signs of injury. Normal range of motion.  Skin:    General: Skin is warm.     Findings: No erythema.  Neurological:     Mental Status: She is alert.     UC Treatments / Results  Labs (all labs ordered are listed, but only abnormal results are displayed) Labs Reviewed - No data to display  EKG   Radiology No results found.  Procedures Procedures (including critical care time)  Medications Ordered in UC Medications - No data to display  Initial Impression / Assessment and Plan / UC Course  I have reviewed the triage vital signs and the nursing notes.  Pertinent labs & imaging results that were available during my care of the patient were reviewed by me and considered in my medical decision making (see chart for details).     1.  Acute idiopathic gout flare involving the left ankle: Indomethacin 50 mg twice daily for 7 days Increase oral fluid intake Gentle range of motion exercises Return to urgent care if symptoms worsen  Final  Clinical Impressions(s) / UC Diagnoses   Final diagnoses:  Acute idiopathic gout of left foot     Discharge Instructions      Gentle range of motion exercises Take medications as prescribed If you have worsening symptoms please return to the urgent care to be reevaluated.   ED Prescriptions     Medication Sig Dispense Auth. Provider   indomethacin (INDOCIN) 50 MG capsule Take 1 capsule (50 mg total) by mouth 2 (two) times daily with a meal for 7 days. 14 capsule Chayton Murata, Britta Mccreedy, MD      PDMP not reviewed this encounter.   Merrilee Jansky, MD 10/26/20 [phone]

## 2021-02-06 ENCOUNTER — Ambulatory Visit: Payer: 59 | Admitting: Internal Medicine

## 2021-02-19 ENCOUNTER — Ambulatory Visit: Payer: 59 | Admitting: Internal Medicine

## 2021-02-19 NOTE — Progress Notes (Deleted)
Name: Nina Phillips  Age/ Sex: 31 y.o., female   MRN/ DOB: [id number], Dec 28, 1990     PCP: Arvilla Market, MD   Reason for Endocrinology Evaluation: Type 2 Diabetes Mellitus  Initial Endocrine Consultative Visit:  09/08/2018    PATIENT IDENTIFIER: Nina Phillips is a 31 y.o. female with a past medical history of T2DM. The patient has followed with Endocrinology clinic since 09/08/2018 for consultative assistance with management of her diabetes.  DIABETIC HISTORY:  Nina Phillips was diagnosed with T2 DM in 06/2018. Her hemoglobin A1c has ranged from 10.6% on her initial diagnosis.  On her initial visit to our clinic she had an A1c of 10.3%, she was on Lantus only, we started Metformin. Invokana caused yeast infection .   She had ~ 5 miscarriages in 2020, and again in 2021. Had fetal demise at 21 weeks  in 2022   Lives with boyfriend and his grandmother  SUBJECTIVE:   During the last visit (10/01/2020): A1c 11.0 %. Increased  Lantus and restarted metformin    Today (02/19/2021): Nina Phillips is here for a follow up on diabetes.   Had an ED visit 10/2020 for gout, was treated with indomethacin   HOME DIABETES REGIMEN:  Metformin 500 tablet,2 tabs BID  Lantus 30 units daily       METER DOWNLOAD SUMMARY:did not bring        DIABETIC COMPLICATIONS: Microvascular complications:   Denies: CKD, Neuropathy, retinopathy    Macrovascular complications:   Denies: CAD, CVA, PVD   HISTORY:  Past Medical History:  Past Medical History:  Diagnosis Date   Abnormal uterine bleeding (AUB) 07/09/2019   Anemia    Anxiety    Bicornate uterus    Depression    seeing a therapist since 3rd miscarriage, helping, doing good.   Diabetes mellitus without complication (HCC)    Type 2   Gout    History of miscarriage    Ovarian cyst    Trichomonas infection 08/24/2018   Treated 08/17/18   Past Surgical History:  Past Surgical History:  Procedure Laterality Date    DILATION AND CURETTAGE OF UTERUS     WISDOM TOOTH EXTRACTION     Social History:  reports that she has quit smoking. She has never used smokeless tobacco. She reports that she does not drink alcohol and does not use drugs. Family History:  Family History  Problem Relation Age of Onset   Cancer Other    Hypertension Other    Diabetes Other    Hypertension Mother    Gout Mother    Diabetes Father    Hypertension Father    Stroke Father    Hypertension Maternal Grandfather    Heart attack Maternal Grandfather    Stroke Paternal Grandmother    Hypertension Maternal Aunt    Cancer Maternal Grandmother        breast   Diabetes Paternal Grandfather      HOME MEDICATIONS: Allergies as of 02/19/2021       Reactions   Apple Fruit Extract Hives   Banana Hives   Other Hives   Walnuts   Shellfish Allergy Hives   Tomato Hives   Watermelon [citrullus Vulgaris] Hives   Bactrim [sulfamethoxazole-trimethoprim] Hives, Rash   Sulfa Antibiotics Hives, Rash        Medication List        Accurate as of February 19, 2021  7:10 AM. If you have any questions, ask your nurse or doctor.  acetaminophen 500 MG tablet Commonly known as: TYLENOL Take 1 tablet (500 mg total) by mouth every 6 (six) hours as needed.   doxycycline 100 MG capsule Commonly known as: VIBRAMYCIN Take 1 capsule (100 mg total) by mouth 2 (two) times daily.   ergocalciferol 1.25 MG (50000 UT) capsule Commonly known as: VITAMIN D2 Take 1 capsule (50,000 Units total) by mouth once a week.   FreeStyle Libre 2 Reader Devi 1 Device by Does not apply route as directed.   FreeStyle Libre 2 Sensor Misc 1 Device by Does not apply route as directed.   gabapentin 100 MG capsule Commonly known as: Neurontin Take 1 capsule (100 mg total) by mouth 3 (three) times daily.   ibuprofen 600 MG tablet Commonly known as: ADVIL Take 1 tablet (600 mg total) by mouth every 8 (eight) hours as needed.   Insulin Pen  Needle 31G X 5 MM Misc 1 Device by Does not apply route daily.   Lantus SoloStar 100 UNIT/ML Solostar Pen Generic drug: insulin glargine Inject 30 Units into the skin daily.   metFORMIN 500 MG 24 hr tablet Commonly known as: GLUCOPHAGE-XR Take 2 tablets (1,000 mg total) by mouth in the morning and at bedtime.   Prenatal Vitamin 27-0.8 MG Tabs Take 1 tablet by mouth daily.         OBJECTIVE:   Vital Signs: There were no vitals taken for this visit.  Wt Readings from Last 3 Encounters:  10/26/20 279 lb (126.6 kg)  10/01/20 276 lb 12.8 oz (125.6 kg)  05/21/20 272 lb 8 oz (123.6 kg)     Exam: General: Pt appears well and is in NAD  Lungs: Clear with good BS bilat with no rales, rhonchi, or wheezes  Heart: RRR with normal S1 and S2 and no gallops; no murmurs; no rub  Extremities: No pretibial edema  Neuro: MS is good with appropriate affect, pt is alert and Ox3    DM foot exam: 05/21/2020    The skin of the feet is intact without sores or ulcerations. The pedal pulses are 2+ on right and 2+ on left. The sensation is intact to a screening 5.07, 10 gram monofilament bilaterally     DATA REVIEWED:  Lab Results  Component Value Date   HGBA1C 11.0 (A) 10/01/2020   HGBA1C 7.6 (A) 05/21/2020   HGBA1C 5.8 (A) 10/31/2019   Results for Nina Phillips, [facility] (MRN [id number]) as of 10/01/2020 15:17  Ref. Range 10/01/2020 08:34  Total CHOL/HDL Ratio Unknown 4  Cholesterol Latest Ref Range: 0 - 200 mg/dL 409189  HDL Cholesterol Latest Ref Range: >39.00 mg/dL 81.1950.50  LDL (calc) Latest Ref Range: 0 - 99 mg/dL 147117 (H)  MICROALB/CREAT RATIO Latest Ref Range: 0.0 - 30.0 mg/g 35.2 (H)  NonHDL Unknown 138.05  Triglycerides Latest Ref Range: 0.0 - 149.0 mg/dL 829.5105.0  VLDL Latest Ref Range: 0.0 - 40.0 mg/dL 62.121.0  VITD Latest Ref Range: 30.00 - 100.00 ng/mL 11.51 (L)  Vitamin B12 Latest Ref Range: 211 - 911 pg/mL 404  TSH Latest Ref Range: 0.35 - 5.50 uIU/mL 3.36  T4,Free(Direct) Latest Ref  Range: 0.60 - 1.60 ng/dL 3.080.87  Creatinine,U Latest Units: mg/dL 657.8195.8  Microalb, Ur Latest Ref Range: 0.0 - 1.9 mg/dL 46.968.9 (H)   Results for Nina Phillips, Nina Phillips (MRN [id number]) as of 10/01/2020 08:26  Ref. Range 08/26/2020 15:54  Sodium Latest Ref Range: 135 - 145 mmol/L 136  Potassium Latest Ref Range: 3.5 - 5.1 mmol/L 3.8  Chloride Latest Ref Range:  98 - 111 mmol/L 107  CO2 Latest Ref Range: 22 - 32 mmol/L 24  Glucose Latest Ref Range: 70 - 99 mg/dL 016 (H)  BUN Latest Ref Range: 6 - 20 mg/dL 14  Creatinine Latest Ref Range: 0.44 - 1.00 mg/dL 0.10  Calcium Latest Ref Range: 8.9 - 10.3 mg/dL 8.5 (L)  Anion gap Latest Ref Range: 5 - 15  5  GFR, Estimated Latest Ref Range: >60 mL/min >60   ASSESSMENT / PLAN / RECOMMENDATIONS:   1) Type 2 Diabetes Mellitus, Sub -Optimally Controlled, With microalbuminuria  - Most recent A1c of 11.0 %. Goal A1c < 6.5 %.      Her A1c is up from 7.6 %  - Her main barriers to diabetes care is depression. She recently lost her grandmother , she was the care taker, prior to that had a fetal demise. She had stopped taking metformin , she admits to dietary indiscretions during that time  - I am going to restart Metformin gradually and increase insulin as below  - Intolerant to SGLT-2 inhibitors due to recurrent yeast infection  - If glucose not controlled with metformin and basal insulin will consider GLP-1 agonist on next visit     MEDICATIONS: - Restart  Metformin 500 mg, 2 tabs with Breakfast and two tabs with supper  - Increase  Lantus to 30   units daily    EDUCATION / INSTRUCTIONS: BG monitoring instructions: Patient is instructed to check her blood sugars 1 times a day Call [facility] if: BG persistently < 70  I reviewed the Rule of 15 for the treatment of hypoglycemia in detail with the patient. Literature supplied.     2) Diabetic complications:  Eye: Does not have known diabetic retinopathy.  Neuro/ Feet: Does not have known  diabetic peripheral neuropathy. Renal: Patient does not have known baseline CKD.      3) Dyslipidemia :  - Lipids today show slight elevation of LDL, will encourage low-fat diet, no indication for statin therapy   4)Vitamin D Deficiency:   -We will replenish with ergocalciferol 50,000 IU weekly  5) Morbid Obesity, BMI 50 :   - Pt interested in discussing surgical weight loss options. Will refer to Bariatric surgery.    F/U in 4 months    Signed electronically by: Lyndle Herrlich, MD  [facility]  [facility] [address] [place] [place], [place] [address] Phone: [phone] FAX: [phone]   CC: Arvilla Market, MD [address] [place] [address] Phone: [phone]  Fax: [phone]  Return to Endocrinology clinic as below: Future Appointments  Date Time Provider Department Center  02/19/2021  8:10 AM Gleason Ardoin, Konrad Dolores, MD [id number] None

## 2021-02-22 ENCOUNTER — Encounter: Payer: Self-pay | Admitting: Emergency Medicine

## 2021-02-22 ENCOUNTER — Ambulatory Visit
Admission: EM | Admit: 2021-02-22 | Discharge: 2021-02-22 | Disposition: A | Discharge status: Home / Self Care | Payer: 59 | Attending: Internal Medicine | Admitting: Internal Medicine

## 2021-02-22 ENCOUNTER — Other Ambulatory Visit: Payer: Self-pay

## 2021-02-22 DIAGNOSIS — M1A0710 Idiopathic chronic gout, right ankle and foot, without tophus (tophi): Secondary | ICD-10-CM | POA: Diagnosis not present

## 2021-02-22 DIAGNOSIS — M79671 Pain in right foot: Secondary | ICD-10-CM | POA: Diagnosis not present

## 2021-02-22 MED ORDER — PREDNISONE 20 MG PO TABS: 40.0000 mg | ORAL_TABLET | Freq: Every day | ORAL | 0 refills | Status: AC

## 2021-02-22 NOTE — Discharge Instructions (Signed)
You have been prescribed prednisone steroid to help alleviate possible gout.  Please follow-up with OB/GYN if symptoms persist.  Monitor your blood sugars very closely while on prednisone.

## 2021-02-22 NOTE — ED Provider Notes (Signed)
[name]    CSN: [id number] Arrival date & time: 02/22/21  1332      History   Chief Complaint Chief Complaint  Patient presents with   Foot Pain    HPI Nina Phillips is a 31 y.o. female.   Patient presents with a right foot pain that started when she awoke this morning.  Patient reports that she has history of chronic gout flareups and this "feels similar".  She denies eating any high purine foods lately.  Denies any injury to the area.  Denies any numbness or tingling.  Denies any fevers.  Patient has not take any medications to help alleviate pain because she is currently [redacted] weeks pregnant and was not sure what to take.   Foot Pain   Past Medical History:  Diagnosis Date   Abnormal uterine bleeding (AUB) 07/09/2019   Anemia    Anxiety    Bicornate uterus    Depression    seeing a therapist since 3rd miscarriage, helping, doing good.   Diabetes mellitus without complication ([place])    Type 2   Gout    History of miscarriage    Ovarian cyst    Trichomonas infection 08/24/2018   Treated 08/17/18    Patient Active Problem List   Diagnosis Date Noted   Morbid obesity ([place]) 10/02/2020   Vitamin D deficiency 10/02/2020   BMI 50.0-59.9, adult ([place]) 10/01/2020   Vaginal delivery 11/12/2019   IUFD at 55 weeks or more of gestation 11/10/2019   Pre-existing diabetes mellitus affecting pregnancy in second trimester, antepartum 11/02/2019   Type 2 diabetes mellitus with hyperglycemia, without long-term current use of insulin ([place]) 11/02/2019   Elevated AFP 10/30/2019   Insufficient fetal fraction x 2 on Panorama 10/08/2019   BMI 40.0-44.9, adult ([place]) 08/27/2019   Obesity in pregnancy 08/27/2019   Chronic hypertension during pregnancy, antepartum 08/27/2019   Anticoagulant long-term use 08/27/2019   Pre-existing diabetes mellitus affecting pregnancy in first trimester, antepartum 08/13/2019   Type 2 diabetes mellitus with microalbuminuria, with long-term  current use of insulin ([place]) 06/26/2019   GBS bacteriuria 08/20/2018   Bicornuate uterus 11/01/2017   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 Authorizing Provider  Continuous Blood Gluc Receiver (FREESTYLE LIBRE 2 READER) DEVI 1 Device by Does not apply route as directed. 10/31/19  Yes Shamleffer, Melanie Crazier, MD  Continuous Blood Gluc Sensor (FREESTYLE LIBRE 2 SENSOR) MISC 1 Device by Does not apply route as directed. 10/31/19  Yes Shamleffer, Melanie Crazier, MD  insulin glargine (LANTUS SOLOSTAR) 100 UNIT/ML Solostar Pen Inject 30 Units into the skin daily. 10/01/20  Yes Shamleffer, Melanie Crazier, MD  Insulin Pen Needle 31G X 5 MM MISC 1 Device by Does not apply route daily. 05/21/20  Yes Shamleffer, Melanie Crazier, MD  metFORMIN (GLUCOPHAGE-XR) 500 MG 24 hr tablet Take 2 tablets (1,000 mg total) by mouth in the morning and at bedtime. 10/01/20  Yes Shamleffer, Melanie Crazier, MD  predniSONE (DELTASONE) 20 MG tablet Take 2 tablets (40 mg total) by mouth daily for 5 days. 02/22/21 02/27/21 Yes Erol Flanagin, Michele Rockers, FNP  Prenatal Vit-Fe Fumarate-FA (PRENATAL VITAMIN) 27-0.8 MG TABS Take 1 tablet by mouth daily.   Yes [provider]  acetaminophen (TYLENOL) 500 MG tablet Take 1 tablet (500 mg total) by mouth every 6 (six) hours as needed. 02/24/20   Avegno, Darrelyn Hillock, FNP  doxycycline (VIBRAMYCIN) 100 MG capsule Take 1 capsule (100 mg total) by mouth 2 (two) times daily. 10/22/20   Fransico Meadow, PA-C  ergocalciferol (VITAMIN D2) 1.25 MG (50000 UT) capsule  Take 1 capsule (50,000 Units total) by mouth once a week. 10/01/20   Shamleffer, Melanie Crazier, MD  gabapentin (NEURONTIN) 100 MG capsule Take 1 capsule (100 mg total) by mouth 3 (three) times daily. 02/24/20   Avegno, Darrelyn Hillock, FNP  ibuprofen (ADVIL) 600 MG tablet Take 1 tablet (600 mg total) by mouth every 8 (eight) hours as needed. 11/11/19   Randa Ngo, MD  diphenhydrAMINE (BENADRYL) 25 mg capsule Take 25 mg by mouth every 6 (six) hours as needed for allergies (and allergic reactions).   12/20/18  [provider]    Family History Family History  Problem Relation Age of Onset   Cancer Other    Hypertension Other    Diabetes Other    Hypertension Mother    Gout Mother    Diabetes Father    Hypertension Father    Stroke Father    Hypertension Maternal Grandfather    Heart attack Maternal Grandfather    Stroke Paternal Grandmother    Hypertension Maternal Aunt    Cancer Maternal Grandmother        breast   Diabetes Paternal Grandfather     Social History Social History   Tobacco Use   Smoking status: Former   Smokeless tobacco: Never   Tobacco comments:    quit 2014  Vaping Use   Vaping Use: Never used  Substance Use Topics   Alcohol use: No    Comment: occas   Drug use: No     Allergies   Apple fruit extract, Banana, Other, Shellfish allergy, Tomato, Watermelon [citrullus vulgaris], Bactrim [sulfamethoxazole-trimethoprim], and Sulfa antibiotics   Review of Systems Review of Systems Per HPI  Physical Exam Triage Vital Signs ED Triage Vitals  Enc Vitals Group     BP 02/22/21 1418 (!) 143/83     Pulse Rate 02/22/21 1418 85     Resp 02/22/21 1418 18     Temp 02/22/21 1418 98.5 F (36.9 C)     Temp Source 02/22/21 1418 Oral     SpO2 02/22/21 1418 99 %     Weight 02/22/21 1420 279 lb (126.6 kg)     Height 02/22/21 1420 5\' 3"  (1.6 m)     Head Circumference --      Peak Flow --      Pain Score 02/22/21 1420 8     Pain Loc --      Pain Edu?  --      Excl. in [employer]? --    No data found.  Updated Vital Signs BP (!) 143/83 (BP Location: Left Arm)    Pulse 85    Temp 98.5 F (36.9 C) (Oral)  Resp 18    Ht 5\' 3"  (1.6 m)    Wt 279 lb (126.6 kg)    LMP 10/22/2020 (Exact Date)    SpO2 99%    BMI 49.42 kg/m   Visual Acuity Right Eye Distance:   Left Eye Distance:   Bilateral Distance:    Right Eye Near:   Left Eye Near:    Bilateral Near:     Physical Exam Constitutional:      General: She is not in acute distress.    Appearance: Normal appearance. She is not toxic-appearing or diaphoretic.  HENT:     Head: Normocephalic and atraumatic.  Eyes:     Extraocular Movements: Extraocular movements intact.     Conjunctiva/sclera: Conjunctivae normal.  Pulmonary:     Effort: Pulmonary effort is normal.  Musculoskeletal:       Feet:     Comments: Patient has mild swelling noted to area circled on diagram of right foot.  No erythema, bruising, lacerations, abrasions noted.  Patient has range of motion of ankle.  Neurovascular intact.  Neurological:     General: No focal deficit present.     Mental Status: She is alert and oriented to person, place, and time. Mental status is at baseline.  Psychiatric:        Mood and Affect: Mood normal.        Behavior: Behavior normal.        Thought Content: Thought content normal.        Judgment: Judgment normal.     UC Treatments / Results  Labs (all labs ordered are listed, but only abnormal results are displayed) Labs Reviewed - No data to display  EKG   Radiology No results found.  Procedures Procedures (including critical care time)  Medications Ordered in UC Medications - No data to display  Initial Impression / Assessment and Plan / UC Course  I have reviewed the triage vital signs and the nursing notes.  Pertinent labs & imaging results that were available during my care of the patient were reviewed by me and considered in my medical decision making (see chart  for details).     Physical exam and patient symptoms seem consistent with possible gout flareup given patient's history.  Do not think that imaging is necessary given that there is no injury and patient's history of gout.  Patient is also pregnant so can not do x-ray. Called on-call OB/GYN to determine appropriate treatment since patient is pregnant.  They recommended that prednisone is safe in pregnancy.  Will prescribe prednisone x5 days.  Discussed return precautions.  Patient verbalized understanding and was agreeable with plan. Final Clinical Impressions(s) / UC Diagnoses   Final diagnoses:  Right foot pain  Chronic gout of right foot, unspecified cause     Discharge Instructions      You have been prescribed prednisone steroid to help alleviate possible gout.  Please follow-up with OB/GYN if symptoms persist.  Monitor your blood sugars very closely while on prednisone.    ED Prescriptions     Medication Sig Dispense Auth. Provider   predniSONE (DELTASONE) 20 MG tablet Take 2 tablets (40 mg total) by mouth daily for 5 days. 10 tablet Teodora Medici, [place]      PDMP not reviewed this encounter.   Teodora Medici, [place] 02/22/21 1451

## 2021-02-22 NOTE — ED Triage Notes (Signed)
Patient c/o that she awoke this morning with right sided foot pain, believes that it's a gout flare which she has history of.  Patient denies OTC pain meds.

## 2021-02-23 ENCOUNTER — Telehealth: Payer: Self-pay

## 2021-02-23 NOTE — Telephone Encounter (Signed)
Patient insurance prefers Semglee instead of Lantus. Can new script be sent ?

## 2021-02-24 ENCOUNTER — Other Ambulatory Visit: Payer: Self-pay | Admitting: Internal Medicine

## 2021-02-24 ENCOUNTER — Encounter (HOSPITAL_COMMUNITY): Payer: Self-pay | Admitting: Emergency Medicine

## 2021-02-24 ENCOUNTER — Other Ambulatory Visit: Payer: Self-pay

## 2021-02-24 ENCOUNTER — Emergency Department (HOSPITAL_COMMUNITY)
Admission: EM | Admit: 2021-02-24 | Discharge: 2021-02-24 | Disposition: A | Discharge status: Home / Self Care | Payer: 59 | Attending: Emergency Medicine | Admitting: Emergency Medicine

## 2021-02-24 DIAGNOSIS — Z3A01 Less than 8 weeks gestation of pregnancy: Secondary | ICD-10-CM | POA: Diagnosis not present

## 2021-02-24 DIAGNOSIS — N760 Acute vaginitis: Secondary | ICD-10-CM | POA: Insufficient documentation

## 2021-02-24 DIAGNOSIS — A599 Trichomoniasis, unspecified: Secondary | ICD-10-CM | POA: Diagnosis not present

## 2021-02-24 DIAGNOSIS — Z79899 Other long term (current) drug therapy: Secondary | ICD-10-CM | POA: Diagnosis not present

## 2021-02-24 DIAGNOSIS — O23591 Infection of other part of genital tract in pregnancy, first trimester: Secondary | ICD-10-CM | POA: Diagnosis present

## 2021-02-24 DIAGNOSIS — B9689 Other specified bacterial agents as the cause of diseases classified elsewhere: Secondary | ICD-10-CM | POA: Diagnosis not present

## 2021-02-24 DIAGNOSIS — Z794 Long term (current) use of insulin: Secondary | ICD-10-CM | POA: Diagnosis not present

## 2021-02-24 LAB — URINALYSIS, ROUTINE W REFLEX MICROSCOPIC
Bilirubin Urine: NEGATIVE
Glucose, UA: >=500 mg/dL — AB
Hgb urine dipstick: NEGATIVE
Ketones, ur: NEGATIVE mg/dL
Leukocytes,Ua: NEGATIVE
Nitrite: NEGATIVE
Protein, ur: 100 mg/dL — AB
Specific Gravity, Urine: 1.02 (ref 1.005–1.030)
pH: 6 (ref 5.0–8.0)

## 2021-02-24 LAB — WET PREP, GENITAL
Sperm: NONE SEEN
WBC, Wet Prep HPF POC: <10 (ref <10)
Yeast Wet Prep HPF POC: NONE SEEN

## 2021-02-24 LAB — URINALYSIS, MICROSCOPIC (REFLEX)

## 2021-02-24 LAB — PREGNANCY, URINE: Preg Test, Ur: POSITIVE — AB

## 2021-02-24 MED ORDER — METRONIDAZOLE 500 MG PO TABS: 500.0000 mg | ORAL_TABLET | Freq: Two times a day (BID) | ORAL | 0 refills | Status: DC

## 2021-02-24 MED ORDER — INSULIN GLARGINE-YFGN 100 UNIT/ML ~~LOC~~ SOLN: 30.0000 [IU] | Freq: Every day | SUBCUTANEOUS | 3 refills | Status: DC

## 2021-02-24 NOTE — ED Triage Notes (Signed)
Pt states she thinks she has BV. States she has burning, itching, and a "fishy odor" to her vagina. Pt states she is 6 weeks and 2 days pregnant and states she wants her "pregnancy levels" checked.

## 2021-02-24 NOTE — ED Provider Notes (Signed)
[facility] EMERGENCY DEPARTMENT Provider Note   CSN: [id number] Arrival date & time: 02/24/21  2031     History  Chief Complaint  Patient presents with   Vaginal Itching    Nina Phillips is a 31 y.o. female reportedly [redacted] weeks along in her pregnancy presenting with vaginal odor, watery discharge and itching for the past few days.  Believes she has BV, reports she always feels like this when she has bacterial vaginosis.  Sexually active, no concerns for STD.  Also requesting testing to figure out how far along she is in her pregnancy.  Reports that her OB/GYN will not see her until her pregnancy is confirmed.  No abdominal pain, some dysuria, no hematuria.   Home Medications Prior to Admission medications   Medication Sig Start Date End Date Taking? Authorizing Provider  metroNIDAZOLE (FLAGYL) 500 MG tablet Take 1 tablet (500 mg total) by mouth 2 (two) times daily. 02/24/21  Yes Bandon Sherwin A, PA-C  acetaminophen (TYLENOL) 500 MG tablet Take 1 tablet (500 mg total) by mouth every 6 (six) hours as needed. 02/24/20   Avegno, Darrelyn Hillock, FNP  Continuous Blood Gluc Receiver (FREESTYLE LIBRE 2 READER) DEVI 1 Device by Does not apply route as directed. 10/31/19   Shamleffer, Melanie Crazier, MD  Continuous Blood Gluc Sensor (FREESTYLE LIBRE 2 SENSOR) MISC 1 Device by Does not apply route as directed. 10/31/19   Shamleffer, Melanie Crazier, MD  doxycycline (VIBRAMYCIN) 100 MG capsule Take 1 capsule (100 mg total) by mouth 2 (two) times daily. 10/22/20   Fransico Meadow, PA-C  ergocalciferol (VITAMIN D2) 1.25 MG (50000 UT) capsule Take 1 capsule (50,000 Units total) by mouth once a week. 10/01/20   Shamleffer, Melanie Crazier, MD  gabapentin (NEURONTIN) 100 MG capsule Take 1 capsule (100 mg total) by mouth 3 (three) times daily. 02/24/20   Avegno, Darrelyn Hillock, FNP  ibuprofen (ADVIL) 600 MG tablet Take 1 tablet (600 mg total) by mouth every 8 (eight) hours as needed. 11/11/19   Randa Ngo, MD   insulin glargine-yfgn (SEMGLEE, YFGN,) 100 UNIT/ML injection Inject 0.3 mLs (30 Units total) into the skin daily. 02/24/21   Shamleffer, Melanie Crazier, MD  Insulin Pen Needle 31G X 5 MM MISC 1 Device by Does not apply route daily. 05/21/20   Shamleffer, Melanie Crazier, MD  metFORMIN (GLUCOPHAGE-XR) 500 MG 24 hr tablet Take 2 tablets (1,000 mg total) by mouth in the morning and at bedtime. 10/01/20   Shamleffer, Melanie Crazier, MD  predniSONE (DELTASONE) 20 MG tablet Take 2 tablets (40 mg total) by mouth daily for 5 days. 02/22/21 02/27/21  Teodora Medici, FNP  Prenatal Vit-Fe Fumarate-FA (PRENATAL VITAMIN) 27-0.8 MG TABS Take 1 tablet by mouth daily.    [provider]  diphenhydrAMINE (BENADRYL) 25 mg capsule Take 25 mg by mouth every 6 (six) hours as needed for allergies (and allergic reactions).   12/20/18  [provider]      Allergies    Apple fruit extract, Banana, Other, Shellfish allergy, Tomato, Watermelon [citrullus vulgaris], Bactrim [sulfamethoxazole-trimethoprim], and Sulfa antibiotics    Review of Systems   Review of Systems  Physical Exam Updated Vital Signs BP 131/71 (BP Location: Right Arm)    Pulse 76    Temp 97.9 F (36.6 C) (Oral)    Resp 18    Ht 5\' 3"  (1.6 m)    Wt 127 kg    LMP 10/22/2020 (Exact Date)    SpO2 100%  BMI 49.60 kg/m  Physical Exam Vitals and nursing note reviewed.  Constitutional:      General: She is not in acute distress.    Appearance: Normal appearance. She is not ill-appearing.  HENT:     Head: Normocephalic and atraumatic.  Eyes:     General: No scleral icterus.    Conjunctiva/sclera: Conjunctivae normal.  Pulmonary:     Effort: Pulmonary effort is normal. No respiratory distress.  Abdominal:     General: Abdomen is flat.     Tenderness: There is no abdominal tenderness.  Genitourinary:    General: Normal vulva.     Vagina: Vaginal discharge present.     Comments: Vaginal vault and cervix erythematous.  Cervix closed.   No bleeding.  Some watery discharge.  Strong odor.  Pelvic exam performed in presence of RN Skin:    General: Skin is warm and dry.     Findings: No rash.  Neurological:     Mental Status: She is alert.  Psychiatric:        Mood and Affect: Mood normal.        Behavior: Behavior normal.    ED Results / Procedures / Treatments   Labs (all labs ordered are listed, but only abnormal results are displayed) Labs Reviewed  WET PREP, GENITAL - Abnormal; Notable for the following components:      Result Value   Trich, Wet Prep PRESENT (*)    Clue Cells Wet Prep HPF POC PRESENT (*)    All other components within normal limits  URINALYSIS, ROUTINE W REFLEX MICROSCOPIC - Abnormal; Notable for the following components:   Glucose, UA >=500 (*)    Protein, ur 100 (*)    All other components within normal limits  PREGNANCY, URINE - Abnormal; Notable for the following components:   Preg Test, Ur POSITIVE (*)    All other components within normal limits  URINALYSIS, MICROSCOPIC (REFLEX) - Abnormal; Notable for the following components:   Bacteria, UA FEW (*)    Trichomonas, UA PRESENT (*)    All other components within normal limits  GC/CHLAMYDIA PROBE AMP ([place]) NOT AT [facility]    EKG None  Radiology No results found.  Procedures Procedures    Medications Ordered in ED Medications - No data to display  ED Course/ Medical Decision Making/ A&P                           Medical Decision Making Amount and/or Complexity of Data Reviewed Labs: ordered.  Risk Prescription drug management.   31 year old female presenting today with vaginal discharge and odor.  Concern for BV.  Pelvic exam performed with RN.  Chlamydia and gonorrhea pending however positive for BV and trichomonas.  I will treat both of these with metronidazole for 7 weeks.  Confirmed safe in pregnancy.  Patient is agreeable to this plan.  She will follow-up on her gonorrhea and chlamydia results in her  chart and be treated by the health department or her primary care provider if either of these are positive.  No empiric treatment due to pregnancy.  Final Clinical Impression(s) / ED Diagnoses Final diagnoses:  Bacterial vaginosis  Trichimoniasis    Rx / DC Orders ED Discharge Orders          Ordered    metroNIDAZOLE (FLAGYL) 500 MG tablet  2 times daily        02/24/21 2302  Results and diagnoses were explained to the patient. Return precautions discussed in full. Patient had no additional questions and expressed complete understanding.   This chart was dictated using voice recognition software.  Despite best efforts to proofread,  errors can occur which can change the documentation meaning.    Darliss Ridgel 02/24/21 2329    Davonna Belling, MD 02/25/21 1022

## 2021-02-24 NOTE — Discharge Instructions (Signed)
You have been diagnosed with bacterial vaginosis and trichomonas today.  These are treated with the same antibiotic, metronidazole.  This is okay to take during pregnancy.  Please follow-up with your primary care provider or OB/GYN for further assessment if needed.

## 2021-02-26 LAB — GC/CHLAMYDIA PROBE AMP (~~LOC~~) NOT AT ARMC
Chlamydia: NEGATIVE
Comment: NEGATIVE
Comment: NORMAL
Neisseria Gonorrhea: NEGATIVE

## 2021-03-02 ENCOUNTER — Telehealth: Payer: Self-pay | Admitting: *Deleted

## 2021-03-02 ENCOUNTER — Other Ambulatory Visit: Payer: Self-pay | Admitting: Obstetrics and Gynecology

## 2021-03-02 DIAGNOSIS — N96 Recurrent pregnancy loss: Secondary | ICD-10-CM

## 2021-03-02 DIAGNOSIS — O0990 Supervision of high risk pregnancy, unspecified, unspecified trimester: Secondary | ICD-10-CM

## 2021-03-02 NOTE — Telephone Encounter (Signed)
Patient was seen at [facility]. She is [redacted] weeks pregnant according to the chart. She is in need of heparin to sustain her pregnancy. Please advise.

## 2021-03-02 NOTE — Telephone Encounter (Signed)
Patient set up with Dr Charlotta Newton on 2/7.

## 2021-03-03 ENCOUNTER — Other Ambulatory Visit: Payer: Self-pay

## 2021-03-03 ENCOUNTER — Ambulatory Visit (INDEPENDENT_AMBULATORY_CARE_PROVIDER_SITE_OTHER): Payer: 59 | Admitting: Obstetrics & Gynecology

## 2021-03-03 ENCOUNTER — Encounter: Payer: Self-pay | Admitting: Obstetrics & Gynecology

## 2021-03-03 VITALS — BP 139/90 | HR 105 | Ht 64.0 in | Wt 270.2 lb

## 2021-03-03 DIAGNOSIS — Z794 Long term (current) use of insulin: Secondary | ICD-10-CM

## 2021-03-03 DIAGNOSIS — Z6841 Body Mass Index (BMI) 40.0 and over, adult: Secondary | ICD-10-CM

## 2021-03-03 DIAGNOSIS — R809 Proteinuria, unspecified: Secondary | ICD-10-CM

## 2021-03-03 DIAGNOSIS — E1129 Type 2 diabetes mellitus with other diabetic kidney complication: Secondary | ICD-10-CM | POA: Diagnosis not present

## 2021-03-03 DIAGNOSIS — O2621 Pregnancy care for patient with recurrent pregnancy loss, first trimester: Secondary | ICD-10-CM

## 2021-03-03 DIAGNOSIS — Z8759 Personal history of other complications of pregnancy, childbirth and the puerperium: Secondary | ICD-10-CM

## 2021-03-03 MED ORDER — HEPARIN SODIUM (PORCINE) 5000 UNIT/0.5ML IJ SOSY: 5000.0000 [IU] | PREFILLED_SYRINGE | Freq: Two times a day (BID) | INTRAMUSCULAR | 4 refills | Status: DC

## 2021-03-03 NOTE — Progress Notes (Signed)
° °  GYN VISIT Patient name: Nina Phillips MRN [id number]  Date of birth: [birth year] Chief Complaint:   Follow-up (Discuss Heparin) and New Patient (Initial Visit)  History of Present Illness:   Nina Phillips is a 31 y.o. [phone] @ [redacted]w[redacted]d by LMP female being seen today for medication concern.  Pt notes early pregnancy and concern about starting heparin.    Records were reviewed- reviewed prior progress notes.  Seems as though she was on heparin 1000u bid due to bleeding, but ideally was supposed to be on 5000u bid.  Records from Dr. Jeannie Fend not found within chart though per pt and notes, it seems they had been requested multiple times.   Per patient, previously seen by Dr. Jeannie Fend who recommended Heparin 5000u twice daily (pt had picture of Rx) with initiation of pregnancy due to RPL (recurrent pregnancy loss).  She currently reports no pelvic or abdominal pain.  Denies vaginal bleeding.  Some nausea, no acute complaints or concerns.     Patient's last menstrual period was 10/22/2020 (exact date).  Depression screen [facility] 2/9 12/13/2019 11/28/2019 10/10/2019 09/17/2019 08/27/2019  Decreased Interest 0 1 0 0 0  Down, Depressed, Hopeless 0 1 0 0 0  PHQ - 2 Score 0 2 0 0 0  Altered sleeping 1 1 1 1 1   Tired, decreased energy 0 0 0 1 1  Change in appetite 0 1 0 1 1  Feeling bad or failure about yourself  1 3 0 0 0  Trouble concentrating 0 1 0 0 0  Moving slowly or fidgety/restless 0 0 0 0 0  Suicidal thoughts 0 1 0 0 0  PHQ-9 Score 2 9 1 3 3   Difficult doing work/chores - - - Not difficult at all -     Review of Systems:   Pertinent items are noted in HPI Denies fever/chills, dizziness, headaches, visual disturbances, fatigue, shortness of breath, chest pain, abdominal pain, vomiting, bowel movements, urination, or intercourse unless otherwise stated above.  Pertinent History Reviewed:  Reviewed past medical,surgical, social, obstetrical and family history.  Reviewed problem list, medications and  allergies. Physical Assessment:   Vitals:   03/03/21 1346  BP: 139/90  Pulse: (!) 105  Weight: 270 lb 3.2 oz (122.6 kg)  Height: 5\' 4"  (1.626 m)  Body mass index is 46.38 kg/m.       Physical Examination:   General appearance: alert, well appearing, and in no distress  Psych: mood appropriate, normal affect  Skin: warm & dry   Cardiovascular: normal heart rate noted  Respiratory: normal respiratory effort, no distress  Abdomen: soft, non-tender   Pelvic: examination not indicated  Extremities: no edema   Bedside : IUP completed viable IUP, flutters noted  Chaperone: N/A    Assessment & Plan:  1) Early IUP, Recurrent pregnancy loss -follow up for scheduled official 05/01/21 -start on heparin 5000u twice daily -also plan to start ASA daily -follow up for initial pregnancy visit with physician only -continue PNV daily  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  Meds ordered this encounter  Medications   Heparin Sodium, Porcine, 5000 UNIT/0.5ML SOSY    Sig: Inject 5,000 Units as directed 2 (two) times daily.    Dispense:  45 mL    Refill:  4     [name], DO Attending Obstetrician & Gynecologist, [facility] for [name], [facility]

## 2021-03-18 ENCOUNTER — Ambulatory Visit
Admission: EM | Admit: 2021-03-18 | Discharge: 2021-03-18 | Disposition: A | Discharge status: Home / Self Care | Payer: 59 | Attending: Family Medicine | Admitting: Family Medicine

## 2021-03-18 ENCOUNTER — Other Ambulatory Visit: Payer: Self-pay

## 2021-03-18 DIAGNOSIS — M109 Gout, unspecified: Secondary | ICD-10-CM

## 2021-03-18 MED ORDER — PREDNISONE 20 MG PO TABS: 40.0000 mg | ORAL_TABLET | Freq: Every day | ORAL | 0 refills | Status: DC

## 2021-03-18 NOTE — ED Triage Notes (Signed)
Pt reports swelling in left big toe x 1 day. Pt reports gout flare up.

## 2021-03-18 NOTE — ED Provider Notes (Signed)
[facility] CARE CENTER   [id number] 03/18/21 Arrival Time: 0846  ASSESSMENT & PLAN:  1. Podagra    No sign of bacterial infection. OTC symptom care as needed. Is pregnant. Begin: Discharge Medication List as of 03/18/2021 11:50 AM     START taking these medications   Details  predniSONE (DELTASONE) 20 MG tablet Take 2 tablets (40 mg total) by mouth daily., Starting Wed 03/18/2021, Normal         Follow-up Information     Arvilla Market, MD.   Specialty: Family Medicine Why: As needed. Contact information: [address], [place] [address] [phone]                 Reviewed expectations re: course of current medical issues. Questions answered. Outlined signs and symptoms indicating need for more acute intervention. Understanding verbalized. After Visit Summary given.   SUBJECTIVE: History from: Patient. Nina Phillips is a 31 y.o. female. 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 Pain x 1 day. Unclear trigger. Last flare 1-2 mo ago. Denies: trauma. Normal PO intake without n/v/d. Is pregnant; no complications. Patient's last menstrual period was 10/22/2020 (exact date).   OBJECTIVE:  Vitals:   03/18/21 1143  BP: 128/83  Pulse: 87  Resp: 20  Temp: 98.5 F (36.9 C)  TempSrc: Oral  SpO2: 100%    General appearance: alert; no distress Eyes: PERRLA; EOMI; conjunctiva normal Extremities: no edema; mild erythema of LEFT great toe at MTP; very TTP Skin: warm and dry Neurologic: normal gait Psychological: alert and cooperative; normal mood and affect   Allergies  Allergen Reactions   Apple Fruit Extract Hives   Banana Hives   Other Hives    Walnuts    Shellfish Allergy Hives   Tomato Hives   Watermelon [Citrullus Vulgaris] Hives   Bactrim [Sulfamethoxazole-Trimethoprim] Hives and Rash   Sulfa Antibiotics Hives and Rash    Past Medical History:  Diagnosis Date   Abnormal uterine bleeding (AUB) 07/09/2019   Anemia    Anxiety     Bicornate uterus    Depression    seeing a therapist since 3rd miscarriage, helping, doing good.   Diabetes mellitus without complication (HCC)    Type 2   Gout    History of miscarriage    Ovarian cyst    Trichomonas infection 08/24/2018   Treated 08/17/18   Social History   Socioeconomic History   Marital status: Single    Spouse name: Not on file   Number of children: Not on file   Years of education: Not on file   Highest education level: Not on file  Occupational History   Not on file  Tobacco Use   Smoking status: Former   Smokeless tobacco: Never   Tobacco comments:    quit 2014  Vaping Use   Vaping Use: Never used  Substance and Sexual Activity   Alcohol use: No    Comment: occas   Drug use: Never   Sexual activity: Yes    Birth control/protection: None    Comment: taken out on 07/20/19  Other Topics Concern   Not on file  Social History Narrative   Not on file   Social Determinants of Health   Financial Resource Strain: Not on file  Food Insecurity: Not on file  Transportation Needs: Not on file  Physical Activity: Not on file  Stress: Not on file  Social Connections: Not on file  Intimate Partner Violence: Not on file   Family History  Problem Relation Age  of Onset   Cancer Other    Hypertension Other    Diabetes Other    Hypertension Mother    Gout Mother    Diabetes Father    Hypertension Father    Stroke Father    Hypertension Maternal Grandfather    Heart attack Maternal Grandfather    Stroke Paternal Grandmother    Hypertension Maternal Aunt    Cancer Maternal Grandmother        breast   Diabetes Paternal Grandfather    Past Surgical History:  Procedure Laterality Date   DILATION AND CURETTAGE OF UTERUS     WISDOM TOOTH EXTRACTION       Mardella Layman, MD 03/18/21 1254

## 2021-03-18 NOTE — Discharge Instructions (Signed)
Be aware, your blood sugars will increase while taking prednisone. 

## 2021-03-19 ENCOUNTER — Other Ambulatory Visit: Payer: Self-pay | Admitting: Obstetrics & Gynecology

## 2021-03-19 ENCOUNTER — Ambulatory Visit: Payer: Self-pay

## 2021-03-19 DIAGNOSIS — O3680X0 Pregnancy with inconclusive fetal viability, not applicable or unspecified: Secondary | ICD-10-CM

## 2021-03-19 DIAGNOSIS — O0990 Supervision of high risk pregnancy, unspecified, unspecified trimester: Secondary | ICD-10-CM | POA: Insufficient documentation

## 2021-03-20 ENCOUNTER — Other Ambulatory Visit: Payer: Self-pay

## 2021-03-20 ENCOUNTER — Ambulatory Visit (INDEPENDENT_AMBULATORY_CARE_PROVIDER_SITE_OTHER): Payer: 59

## 2021-03-20 ENCOUNTER — Ambulatory Visit: Payer: 59 | Admitting: *Deleted

## 2021-03-20 ENCOUNTER — Ambulatory Visit (INDEPENDENT_AMBULATORY_CARE_PROVIDER_SITE_OTHER): Payer: 59 | Admitting: Obstetrics & Gynecology

## 2021-03-20 ENCOUNTER — Encounter: Payer: Self-pay | Admitting: Obstetrics & Gynecology

## 2021-03-20 ENCOUNTER — Other Ambulatory Visit: Payer: Self-pay | Admitting: Obstetrics & Gynecology

## 2021-03-20 DIAGNOSIS — N96 Recurrent pregnancy loss: Secondary | ICD-10-CM

## 2021-03-20 DIAGNOSIS — O0991 Supervision of high risk pregnancy, unspecified, first trimester: Secondary | ICD-10-CM

## 2021-03-20 DIAGNOSIS — O039 Complete or unspecified spontaneous abortion without complication: Secondary | ICD-10-CM

## 2021-03-20 DIAGNOSIS — O10919 Unspecified pre-existing hypertension complicating pregnancy, unspecified trimester: Secondary | ICD-10-CM

## 2021-03-20 DIAGNOSIS — O3680X0 Pregnancy with inconclusive fetal viability, not applicable or unspecified: Secondary | ICD-10-CM | POA: Diagnosis not present

## 2021-03-20 DIAGNOSIS — O24111 Pre-existing diabetes mellitus, type 2, in pregnancy, first trimester: Secondary | ICD-10-CM

## 2021-03-20 MED ORDER — MISOPROSTOL 200 MCG PO TABS: ORAL_TABLET | ORAL | 1 refills | Status: DC

## 2021-03-20 NOTE — Progress Notes (Addendum)
US TV 9+3 wks,single IUP,no fetal heart tones visualize,normal ovaries,posterior right intramural fibroid .9 x.5 x .6 cm,Dr Ozan reviewed images during ultrasound  Kimara Bencomo J Carl1:32 PM

## 2021-03-20 NOTE — Progress Notes (Signed)
INITIAL OBSTETRICAL VISIT Patient name: Nina Phillips MRN [id number]  Date of birth: Mar 02, 1990 Chief Complaint:   Initial Prenatal Visit  History of Present Illness:   Nina Phillips is a 31 y.o. [id number]  female at [redacted]w[redacted]d by who presents for initial visit.  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  Starting on heparin twice weekly -h/o PTD @ 21wk   Today she reports some weight loss- denies significant nausea/vomiting.  Some acid reflux.  No vaginal bleeding.  Denies pelvic or abdominal pain.  Depression screen [facility] 2/9 03/20/2021 12/13/2019 11/28/2019 10/10/2019 09/17/2019  Decreased Interest 0 0 1 0 0  Down, Depressed, Hopeless 0 0 1 0 0  PHQ - 2 Score 0 0 2 0 0  Altered sleeping 0 1 1 1 1   Tired, decreased energy 1 0 0 0 1  Change in appetite 0 0 1 0 1  Feeling bad or failure about yourself  0 1 3 0 0  Trouble concentrating 0 0 1 0 0  Moving slowly or fidgety/restless 0 0 0 0 0  Suicidal thoughts 0 0 1 0 0  PHQ-9 Score 1 2 9 1 3   Difficult doing work/chores - - - - Not difficult at all  Some recent data might be hidden    Patient's last menstrual period was 10/22/2020 (exact date). Last pap 2021. Results were: NILM w/ HRHPV not done Review of Systems:   Pertinent items are noted in HPI Denies cramping/contractions, leakage of fluid, vaginal bleeding, abnormal vaginal discharge w/ itching/odor/irritation, headaches, visual changes, shortness of breath, chest pain, abdominal pain, severe nausea/vomiting, or problems with urination or bowel movements unless otherwise stated above.  Pertinent History Reviewed:  Reviewed past medical,surgical, social, obstetrical and family history.  Reviewed problem list, medications and allergies. OB History  Gravida Para Term Preterm AB Living  6 1   1 4  0  SAB IAB Ectopic Multiple Live Births  4     0      #  Outcome Date GA Lbr Len/2nd Weight Sex Delivery Anes PTL Lv  6 Current           5 Preterm 11/11/19 [redacted]w[redacted]d  5.3 oz (0.15 kg) M Vag-Spont EPI  FD  4 SAB 08/2018 [redacted]w[redacted]d            Birth Comments: missed Ab, CRL c/w [redacted]w[redacted]d, cytotec  3 SAB 03/2017          2 SAB 09/2016          1 SAB 07/2016           Physical Assessment:  LMP 10/22/2020 (Exact Date)         Physical Examination:  General appearance - well appearing, and in no distress  Mental status - alert, oriented to person, place, and time  Psych:  She has a normal mood and affect  Skin - warm and dry, normal color, no suspicious lesions noted  Breast- no masses noted, bilateral nipple piercings Chest - effort normal, all lung fields clear to auscultation bilaterally  Heart - normal rate and regular rhythm  Abdomen - obese, soft, nontender  Extremities:  No swelling or varicosities noted  Pelvic - VULVA: normal appearing vulva with no masses, tenderness or lesions  VAGINA: normal appearing vagina with normal color and discharge, no lesions  CERVIX: normal appearing cervix without discharge or lesions, no CMT, cervix closed  Chaperone:  patient's mother     Ultrasound completed after OB visit due to schedule availability.  9+3 wks,single IUP,no fetal heart tones visualize,normal ovaries,posterior right intramural fibroid .9 x.5 x .6 cm,Dr Elyssia Strausser review images during ultrasound No results found for this or any previous visit (from the past 24 hour(s)).  Assessment & Plan:  -Miscarriage Pt upset about today's news and left briefly after review of [name] -agreeable to medication, Rx sent in -Advised follow up in 1 wk -Pt did not stay to schedule this follow up nor stay to hear complete instructions about medication and risks -she reports that she has done this before -shared with her mom that she can stop the heparin  Meds:  Meds ordered this encounter  Medications   misoprostol (CYTOTEC) 200 MCG tablet    Sig: Either take 4  tablets orally or place in the vagina once.  If no bleeding/cramping in 24 hours, please repeat process.    Dispense:  4 tablet    Refill:  1   Myna Hidalgo, DO Attending Obstetrician & Gynecologist, [facility] [employer], [facility]

## 2021-03-24 ENCOUNTER — Encounter: Payer: Self-pay | Admitting: Obstetrics & Gynecology

## 2021-03-24 ENCOUNTER — Other Ambulatory Visit: Payer: 59

## 2021-03-24 ENCOUNTER — Encounter: Payer: Self-pay | Admitting: *Deleted

## 2021-03-26 ENCOUNTER — Encounter: Payer: Self-pay | Admitting: Internal Medicine

## 2021-03-26 ENCOUNTER — Ambulatory Visit: Payer: 59 | Admitting: Obstetrics & Gynecology

## 2021-03-26 ENCOUNTER — Other Ambulatory Visit: Payer: Self-pay

## 2021-03-26 ENCOUNTER — Encounter: Payer: Self-pay | Admitting: Obstetrics & Gynecology

## 2021-03-26 VITALS — BP 136/95 | HR 92 | Ht 64.0 in | Wt 263.0 lb

## 2021-03-26 DIAGNOSIS — O039 Complete or unspecified spontaneous abortion without complication: Secondary | ICD-10-CM | POA: Diagnosis not present

## 2021-03-26 DIAGNOSIS — N96 Recurrent pregnancy loss: Secondary | ICD-10-CM

## 2021-03-26 MED ORDER — MISOPROSTOL 200 MCG PO TABS: ORAL_TABLET | ORAL | 0 refills | Status: DC

## 2021-03-26 NOTE — Progress Notes (Signed)
Chief Complaint  Patient presents with   Follow-up    SAB      31 y.o. [id number] Patient's last menstrual period was 10/22/2020 (exact date). The current method of family planning is none.  Outpatient Encounter Medications as of 03/26/2021  Medication Sig   insulin glargine-yfgn (SEMGLEE, YFGN,) 100 UNIT/ML injection Inject 0.3 mLs (30 Units total) into the skin daily.   Insulin Pen Needle 31G X 5 MM MISC 1 Device by Does not apply route daily.   metFORMIN (GLUCOPHAGE-XR) 500 MG 24 hr tablet Take 2 tablets (1,000 mg total) by mouth in the morning and at bedtime.   [DISCONTINUED] misoprostol (CYTOTEC) 200 MCG tablet 2 tablets three times daily for 3 days (Patient not taking: Reported on 07/10/2021)   [DISCONTINUED] diphenhydrAMINE (BENADRYL) 25 mg capsule Take 25 mg by mouth every 6 (six) hours as needed for allergies (and allergic reactions).    [DISCONTINUED] Heparin Sodium, Porcine, 5000 UNIT/0.5ML SOSY Inject 5,000 Units as directed 2 (two) times daily.   [DISCONTINUED] misoprostol (CYTOTEC) 200 MCG tablet Either take 4 tablets orally or place in the vagina once.  If no bleeding/cramping in 24 hours, please repeat process.   [DISCONTINUED] predniSONE (DELTASONE) 20 MG tablet Take 2 tablets (40 mg total) by mouth daily.   [DISCONTINUED] Prenatal Vit-Fe Fumarate-FA (PRENATAL VITAMIN) 27-0.8 MG TABS Take 1 tablet by mouth daily.   No facility-administered encounter medications on file as of 03/26/2021.    Subjective Pt with [redacted]w[redacted]d non viable IUP S/P cytotec, passage of tissue, large blot clots and heavy cramping Bleeding resolved but then started a bit more again No more tissue   Past Medical History:  Diagnosis Date   Abnormal uterine bleeding (AUB) 07/09/2019   Anemia    Anxiety    Bicornate uterus    Depression    seeing a therapist since 3rd miscarriage, helping, doing good.   Diabetes mellitus without complication (HCC)    Type 2   Gout    History of miscarriage     HSV-2 infection    Hypertension    Ovarian cyst    Trichomonas infection 08/24/2018   Treated 08/17/18    Past Surgical History:  Procedure Laterality Date   DILATION AND CURETTAGE OF UTERUS     WISDOM TOOTH EXTRACTION      OB History     Gravida  7   Para  1   Term      Preterm  1   AB  5   Living  0      SAB  5   IAB      Ectopic      Multiple  0   Live Births              Allergies  Allergen Reactions   Apple Fruit Extract Hives   Banana Hives   Other Hives    Walnuts    Shellfish Allergy Hives   Tomato Hives   Watermelon [Citrullus Vulgaris] Hives   Bactrim [Sulfamethoxazole-Trimethoprim] Hives and Rash   Sulfa Antibiotics Hives and Rash    Social History   Socioeconomic History   Marital status: Married    Spouse name: Not on file   Number of children: Not on file   Years of education: Not on file   Highest education level: Not on file  Occupational History   Not on file  Tobacco Use   Smoking status: Former   Smokeless tobacco: Never   Tobacco  comments:    quit 2014  Vaping Use   Vaping Use: Never used  Substance and Sexual Activity   Alcohol use: No    Comment: occas   Drug use: Never   Sexual activity: Yes    Birth control/protection: None  Other Topics Concern   Not on file  Social History Narrative   Not on file   Social Determinants of Health   Financial Resource Strain: Low Risk  (08/28/2021)   Overall Financial Resource Strain (CARDIA)    Difficulty of Paying Living Expenses: Not hard at all  Food Insecurity: No Food Insecurity (08/28/2021)   Hunger Vital Sign    Worried About Running Out of Food in the Last Year: Never true    Ran Out of Food in the Last Year: Never true  Transportation Needs: No Transportation Needs (08/28/2021)   PRAPARE - Administrator, Civil Service (Medical): No    Lack of Transportation (Non-Medical): No  Physical Activity: Insufficiently Active (08/28/2021)   Exercise Vital  Sign    Days of Exercise per Week: 4 days    Minutes of Exercise per Session: 30 min  Stress: No Stress Concern Present (08/28/2021)   [employer] of Occupational Health - Occupational Stress Questionnaire    Feeling of Stress : Only a little  Social Connections: Moderately Integrated (08/28/2021)   Social Connection and Isolation Panel [NHANES]    Frequency of Communication with Friends and Family: More than three times a week    Frequency of Social Gatherings with Friends and Family: Once a week    Attends Religious Services: More than 4 times per year    Active Member of [employer] or Organizations: No    Attends Engineer, structural: Never    Marital Status: Married    Family History  Problem Relation Age of Onset   Hypertension Mother    Gout Mother    Diabetes Father    Hypertension Father    Stroke Father    Hypertension Maternal Aunt    Cancer Maternal Grandmother        breast   Hypertension Maternal Grandfather    Heart attack Maternal Grandfather    Stroke Paternal Grandmother    Diabetes Paternal Grandfather    Cancer Other    Hypertension Other    Diabetes Other     Medications:       Current Outpatient Medications:    insulin glargine-yfgn (SEMGLEE, YFGN,) 100 UNIT/ML injection, Inject 0.3 mLs (30 Units total) into the skin daily., Disp: 30 mL, Rfl: 3   Insulin Pen Needle 31G X 5 MM MISC, 1 Device by Does not apply route daily., Disp: 150 each, Rfl: 3   metFORMIN (GLUCOPHAGE-XR) 500 MG 24 hr tablet, Take 2 tablets (1,000 mg total) by mouth in the morning and at bedtime., Disp: 360 tablet, Rfl: 3   aspirin 81 MG chewable tablet, Chew by mouth daily. (Patient not taking: Reported on 10/15/2021), Disp: , Rfl:    cyclobenzaprine (FLEXERIL) 10 MG tablet, Take 1 tablet (10 mg total) by mouth 2 (two) times daily as needed for muscle spasms., Disp: 30 tablet, Rfl: 0   desogestrel-ethinyl estradiol (APRI) 0.15-30 MG-MCG tablet, Take 1 tablet by mouth daily., Disp:  28 tablet, Rfl: 11   folic acid (FOLVITE) 1 MG tablet, Take 1 mg by mouth daily. (Patient not taking: Reported on 10/15/2021), Disp: , Rfl:    Heparin Sodium, Porcine, 5000 UNIT/0.5ML SOSY, Inject 5,000 Units as directed 2 (two)  times daily. (Patient not taking: Reported on 10/15/2021), Disp: 30 mL, Rfl: 6   insulin aspart (NOVOLOG) 100 UNIT/ML injection, Inject 2 Units into the skin 3 (three) times daily before meals. (Patient not taking: Reported on 10/15/2021), Disp: 10 mL, Rfl: 2   Insulin Syringe-Needle U-100 (GLOBAL INSULIN SYRINGES) 30G X 5/16" 0.3 ML MISC, 1 Syringe by Does not apply route 3 (three) times daily., Disp: 100 each, Rfl: 4   iron polysaccharides (NIFEREX) 150 MG capsule, Take 1 capsule (150 mg total) by mouth daily., Disp: 30 capsule, Rfl: 6   NIFEdipine (PROCARDIA XL) 30 MG 24 hr tablet, Take 1 tablet (30 mg total) by mouth daily., Disp: 30 tablet, Rfl: 6   Prenatal MV & Min w/FA-DHA (PRENATAL GUMMIES PO), Take by mouth. (Patient not taking: Reported on 10/15/2021), Disp: , Rfl:   Objective Blood pressure (!) 136/95, pulse 92, height 5\' 4"  (1.626 m), weight 263 lb (119.3 kg), last menstrual period 10/22/2020, not currently breastfeeding.  General WDWN female NAD Vulva:  normal appearing vulva with no masses, tenderness or lesions Vagina:  normal mucosa, no discharge some blood in the vault Cervix:  Normal no lesions a bit more open than expected Uterus:  normal size, contour, position, consistency, mobility, non-tender Adnexa: ovaries:present,  normal adnexa in size, nontender and no masses   Pertinent ROS No burning with urination, frequency or urgency No nausea, vomiting or diarrhea Nor fever chills or other constitutional symptoms   Labs or studies Reviewed sonogram     Impression Diagnoses this Encounter::   ICD-10-CM   1. Recurrent pregnancy loss  N96     2. Miscarriage  O03.9       Established relevant diagnosis(es):   Plan/Recommendations: Meds  ordered this encounter  Medications   DISCONTD: misoprostol (CYTOTEC) 200 MCG tablet    Sig: 2 tablets three times daily for 3 days    Dispense:  18 tablet    Refill:  0    Labs or Scans Ordered: No orders of the defined types were placed in this encounter.   Management:: 3 day cytotec regimen to facilitate the decidualized endometrium passing  Follow up Return if symptoms worsen or fail to improve.    All questions were answered.

## 2021-03-30 ENCOUNTER — Encounter: Payer: Self-pay | Admitting: Obstetrics & Gynecology

## 2021-03-30 ENCOUNTER — Other Ambulatory Visit: Payer: 59

## 2021-04-14 ENCOUNTER — Encounter (HOSPITAL_COMMUNITY): Payer: Self-pay | Admitting: Emergency Medicine

## 2021-04-14 ENCOUNTER — Other Ambulatory Visit: Payer: Self-pay

## 2021-04-14 ENCOUNTER — Emergency Department (HOSPITAL_COMMUNITY)
Admission: EM | Admit: 2021-04-14 | Discharge: 2021-04-15 | Disposition: A | Discharge status: Home / Self Care | Payer: 59 | Attending: Emergency Medicine | Admitting: Emergency Medicine

## 2021-04-14 DIAGNOSIS — Z7984 Long term (current) use of oral hypoglycemic drugs: Secondary | ICD-10-CM | POA: Insufficient documentation

## 2021-04-14 DIAGNOSIS — Z794 Long term (current) use of insulin: Secondary | ICD-10-CM | POA: Insufficient documentation

## 2021-04-14 DIAGNOSIS — L304 Erythema intertrigo: Secondary | ICD-10-CM | POA: Diagnosis not present

## 2021-04-14 DIAGNOSIS — L309 Dermatitis, unspecified: Secondary | ICD-10-CM

## 2021-04-14 DIAGNOSIS — B372 Candidiasis of skin and nail: Secondary | ICD-10-CM

## 2021-04-14 DIAGNOSIS — E119 Type 2 diabetes mellitus without complications: Secondary | ICD-10-CM | POA: Insufficient documentation

## 2021-04-14 DIAGNOSIS — R21 Rash and other nonspecific skin eruption: Secondary | ICD-10-CM | POA: Diagnosis present

## 2021-04-14 NOTE — ED Triage Notes (Signed)
Pt states she has a rash "all over" that has been ongoing for "awhile". Small flesh colored bumps noted to arms bilaterally.Pt states she itches all over. Pt has not taken any meds for itching. Also c/o pain under both breasts which are excoriated. Pt appears to have some type of ointment on those areas. Pt states bumps are near her vagina as well. ?

## 2021-04-15 MED ORDER — CLOTRIMAZOLE 1 % EX CREA: TOPICAL_CREAM | CUTANEOUS | 0 refills | Status: DC

## 2021-04-15 MED ORDER — FLUCONAZOLE 200 MG PO TABS: 200.0000 mg | ORAL_TABLET | ORAL | 0 refills | Status: AC

## 2021-04-15 NOTE — ED Provider Notes (Signed)
? ? EMERGENCY DEPARTMENT  ?Provider Note ? ?CSN: [id number] ?Arrival date & time: 04/14/21 2111 ? ?History ?Chief Complaint  ?Patient presents with  ? Rash  ? ? ?Nina Phillips is a 31 y.o. female here for evaluation of rash, itching on her R hand, R neck but also under her breasts and in her inguinal area. Ongoing for several weeks, has not had relief with calamine lotion. Patient is diabetic.  ? ? ?Home Medications ?Prior to Admission medications   ?Medication Sig Start Date End Date Taking? Authorizing Provider  ?clotrimazole (LOTRIMIN) 1 % cream Apply to affected area 2 times daily 04/15/21  Yes Pollyann Savoy, MD  ?fluconazole (DIFLUCAN) 200 MG tablet Take 1 tablet (200 mg total) by mouth once a week for 2 doses. 04/15/21 04/23/21 Yes Pollyann Savoy, MD  ?insulin glargine-yfgn (SEMGLEE, YFGN,) 100 UNIT/ML injection Inject 0.3 mLs (30 Units total) into the skin daily. 02/24/21   Shamleffer, Konrad Dolores, MD  ?Insulin Pen Needle 31G X 5 MM MISC 1 Device by Does not apply route daily. 05/21/20   Shamleffer, Konrad Dolores, MD  ?metFORMIN (GLUCOPHAGE-XR) 500 MG 24 hr tablet Take 2 tablets (1,000 mg total) by mouth in the morning and at bedtime. 10/01/20   Shamleffer, Konrad Dolores, MD  ?misoprostol (CYTOTEC) 200 MCG tablet 2 tablets three times daily for 3 days 03/26/21   Lazaro Arms, MD  ?diphenhydrAMINE (BENADRYL) 25 mg capsule Take 25 mg by mouth every 6 (six) hours as needed for allergies (and allergic reactions).   12/20/18  [provider]  ? ? ? ?Allergies    ?Apple fruit extract, Banana, Other, Shellfish allergy, Tomato, Watermelon [citrullus vulgaris], Bactrim [sulfamethoxazole-trimethoprim], and Sulfa antibiotics ? ? ?Review of Systems   ?Review of Systems ?Please see HPI for pertinent positives and negatives ? ?Physical Exam ?BP (!) 145/101 (BP Location: Right Arm)   Pulse 87   Temp 98.1 ?F (36.7 ?C) (Oral)   Resp (!) 21   Ht 5\' 4"  (1.626 m)   Wt 120 kg   SpO2 100%   BMI  45.41 kg/m?  ? ?Physical Exam ?Vitals and nursing note reviewed.  ?HENT:  ?   Head: Normocephalic.  ?   Nose: Nose normal.  ?Eyes:  ?   Extraocular Movements: Extraocular movements intact.  ?Pulmonary:  ?   Effort: Pulmonary effort is normal.  ?Musculoskeletal:     ?   General: Normal range of motion.  ?   Cervical back: Neck supple.  ?Skin: ?   Findings: No rash (on exposed skin).  ?   Comments: Small area of non-specific dermatitis on R wrist and R neck.  ?With chaperone present, there is also candidal infection under her breasts and in her inguinal areas.   ?Neurological:  ?   Mental Status: She is alert and oriented to person, place, and time.  ?Psychiatric:     ?   Mood and Affect: Mood normal.  ? ? ?ED Results / Procedures / Treatments   ?EKG ?None ? ?Procedures ?Procedures ? ?Medications Ordered in the ED ?Medications - No data to display ? ?Initial Impression and Plan ? Patient here with essentially two separate rashes. Mild dermatitis on wrist/neck, recommend she use OTC cortisone. She also has candidal infection under breasts and in inguinal area. Recommend oral and topical antifungals.  ? ?ED Course  ? ?  ? ? ?MDM Rules/Calculators/A&P ?Medical Decision Making ?Problems Addressed: ?Candidal intertrigo: acute illness or injury ?Dermatitis: acute illness or injury ? ?  Risk ?Prescription drug management. ? ? ? ?Final Clinical Impression(s) / ED Diagnoses ?Final diagnoses:  ?Dermatitis  ?Candidal intertrigo  ? ? ?Rx / DC Orders ?ED Discharge Orders   ? ?      Ordered  ?  fluconazole (DIFLUCAN) 200 MG tablet  Weekly       ? 04/15/21 0120  ?  clotrimazole (LOTRIMIN) 1 % cream       ? 04/15/21 0120  ? ?  ?  ? ?  ? ?  ?Pollyann Savoy, MD ?04/15/21 0120 ? ?

## 2021-07-10 ENCOUNTER — Other Ambulatory Visit: Payer: Self-pay | Admitting: *Deleted

## 2021-07-10 ENCOUNTER — Ambulatory Visit (INDEPENDENT_AMBULATORY_CARE_PROVIDER_SITE_OTHER): Payer: 59 | Admitting: *Deleted

## 2021-07-10 VITALS — BP 168/99 | HR 96 | Ht 63.0 in | Wt 265.0 lb

## 2021-07-10 DIAGNOSIS — Z3201 Encounter for pregnancy test, result positive: Secondary | ICD-10-CM | POA: Diagnosis not present

## 2021-07-10 DIAGNOSIS — N926 Irregular menstruation, unspecified: Secondary | ICD-10-CM

## 2021-07-10 LAB — POCT URINE PREGNANCY: Preg Test, Ur: POSITIVE — AB

## 2021-07-10 MED ORDER — HEPARIN SODIUM (PORCINE) 5000 UNIT/0.5ML IJ SOSY: 5000.0000 [IU] | PREFILLED_SYRINGE | Freq: Two times a day (BID) | INTRAMUSCULAR | 6 refills | Status: DC

## 2021-07-10 NOTE — Progress Notes (Signed)
   NURSE VISIT- PREGNANCY CONFIRMATION   SUBJECTIVE:  Nina Phillips is a 31 y.o. [id number] female at [redacted]w[redacted]d by certain LMP of Patient's last menstrual period was 05/30/2021. Here for pregnancy confirmation.  Home pregnancy test: positive x 5   She reports no complaints.  She is taking prenatal vitamins.    OBJECTIVE:  BP (!) 168/99 (BP Location: Right Arm, Patient Position: Sitting, Cuff Size: Normal)   Pulse 96   Ht 5\' 3"  (1.6 m)   Wt 265 lb (120.2 kg)   LMP 05/30/2021   BMI 46.94 kg/m   Appears well, in no apparent distress  Results for orders placed or performed in visit on 07/10/21 (from the past 24 hour(s))  POCT urine pregnancy   Collection Time: 07/10/21  9:43 AM  Result Value Ref Range   Preg Test, Ur Positive (A) Negative    ASSESSMENT: Positive pregnancy test, [redacted]w[redacted]d by LMP   Elevated blood pressure  PLAN: Schedule for dating ultrasound in 3 weeks Prenatal vitamins: continue   Nausea medicines: not currently needed   OB packet given: Yes Check blood pressure four times daily and bring log to dating u/s visit   Needs refill sent for Heparin   [redacted]w[redacted]d  07/10/2021 12:02 PM

## 2021-07-13 ENCOUNTER — Other Ambulatory Visit: Payer: Self-pay | Admitting: *Deleted

## 2021-07-13 ENCOUNTER — Ambulatory Visit
Admission: RE | Admit: 2021-07-13 | Discharge: 2021-07-13 | Disposition: A | Discharge status: Home / Self Care | Payer: 59 | Source: Ambulatory Visit | Attending: Nurse Practitioner | Admitting: Nurse Practitioner

## 2021-07-13 ENCOUNTER — Encounter: Payer: Self-pay | Admitting: Obstetrics & Gynecology

## 2021-07-13 VITALS — BP 143/87 | HR 107 | Temp 98.5°F | Resp 20

## 2021-07-13 DIAGNOSIS — M722 Plantar fascial fibromatosis: Secondary | ICD-10-CM | POA: Insufficient documentation

## 2021-07-13 DIAGNOSIS — N760 Acute vaginitis: Secondary | ICD-10-CM | POA: Diagnosis present

## 2021-07-13 DIAGNOSIS — B3731 Acute candidiasis of vulva and vagina: Secondary | ICD-10-CM | POA: Diagnosis not present

## 2021-07-13 NOTE — Discharge Instructions (Addendum)
Continue taking Tylenol as needed for pain at this time. As discussed, freeze a water bottle and use it to roll under the left foot while symptoms persist.  You can also use a tennis bottle. Recommend wearing shoes with better support as discussed. You can also continue using the brace that you are wearing for your foot and ankle. If your symptoms do not improve or continue to worsen, please follow-up with orthopedics.  If your cytology results are positive, you will be contacted and provided the appropriate treatment. Follow-up as needed.

## 2021-07-13 NOTE — ED Provider Notes (Signed)
[facility]    CSN: [id number] Arrival date & time: 07/13/21  1710      History   Chief Complaint Chief Complaint  Patient presents with   Ankle Pain    Also Bacterial Vaginosis plus I am 6weeks and 2 days pregnant. - Entered by patient    HPI Nina Phillips is a 31 y.o. female.   The history is provided by the patient.   Patient presents for complaints of left foot pain and vaginal symptoms.  Patient states that her foot pain started when she got out of the bed this morning.  She states that she has not had any new injury or trauma.  Patient also complains of swelling to the foot.  She states that last week at work she was going up and down several stairs, and then states that she did when she got home she was also going up and down several stairs.  She denies radiation of pain, numbness, tingling, or ankle pain.  Patient also states she has been wearing crocs every day to work.  She has not taken any medication for her symptoms.  Ports she is [redacted] weeks pregnant.  Patient also presents for complaints of vaginal itching and irritation.  She states symptoms started approximately 4 days ago.  She denies vaginal odor, urinary symptoms or abdominal pain.  She states that she does have a history of BV.  She is currently married, and is not concerned about STI or STD.  Her last menstrual cycle was on 05/30/2021.  Past Medical History:  Diagnosis Date   Abnormal uterine bleeding (AUB) 07/09/2019   Anemia    Anxiety    Bicornate uterus    Depression    seeing a therapist since 3rd miscarriage, helping, doing good.   Diabetes mellitus without complication (HCC)    Type 2   Gout    History of miscarriage    Ovarian cyst    Trichomonas infection 08/24/2018   Treated 08/17/18    Patient Active Problem List   Diagnosis Date Noted   Supervision of high-risk pregnancy 03/19/2021   Morbid obesity (HCC) 10/02/2020   Vitamin D deficiency 10/02/2020   BMI 50.0-59.9, adult (HCC)  10/01/2020   IUFD at 20 weeks or more of gestation 11/10/2019   Pre-existing diabetes mellitus affecting pregnancy in second trimester, antepartum 11/02/2019   Type 2 diabetes mellitus with hyperglycemia, without long-term current use of insulin (HCC) 11/02/2019   BMI 40.0-44.9, adult (HCC) 08/27/2019   Obesity in pregnancy 08/27/2019   Chronic hypertension during pregnancy, antepartum 08/27/2019   Anticoagulant long-term use 08/27/2019   Type 2 diabetes mellitus with microalbuminuria, with long-term current use of insulin (HCC) 06/26/2019   Bicornuate uterus 11/01/2017   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 Authorizing Provider  Heparin Sodium, Porcine, 5000 UNIT/0.5ML SOSY Inject 5,000 Units as directed 2 (two) times daily. 07/10/21   Lazaro Arms, MD  insulin glargine-yfgn (SEMGLEE, YFGN,) 100 UNIT/ML injection Inject 0.3 mLs (30 Units total) into the skin daily. 02/24/21   Shamleffer, Konrad Dolores, MD  Insulin Pen Needle 31G X 5 MM MISC 1 Device by Does not apply route daily. Patient not taking: Reported on 07/10/2021 05/21/20   Shamleffer, Konrad Dolores, MD  metFORMIN (GLUCOPHAGE-XR) 500 MG 24 hr tablet Take 2 tablets (1,000 mg total) by mouth in the morning and at bedtime. 10/01/20   Shamleffer, Konrad Dolores, MD  Prenatal MV & Min w/FA-DHA (PRENATAL GUMMIES PO) Take by mouth.    [provider]  diphenhydrAMINE (BENADRYL) 25 mg capsule Take 25 mg by mouth every 6 (six) hours as needed for allergies (and allergic reactions).   12/20/18   [provider]    Family History Family History  Problem Relation Age of Onset   Hypertension Mother    Gout Mother    Diabetes Father    Hypertension Father    Stroke Father    Hypertension Maternal Aunt    Cancer Maternal Grandmother        breast   Hypertension Maternal Grandfather    Heart attack Maternal Grandfather    Stroke Paternal Grandmother    Diabetes Paternal Grandfather    Cancer Other    Hypertension Other    Diabetes Other     Social History Social History   Tobacco Use   Smoking status: Former   Smokeless tobacco: Never   Tobacco comments:    quit 2014  Vaping Use   Vaping Use: Never used  Substance Use Topics   Alcohol use: No    Comment: occas   Drug use: Never     Allergies   Apple fruit extract, Banana, Other, Shellfish allergy, Tomato, Watermelon [citrullus vulgaris], Bactrim [sulfamethoxazole-trimethoprim], and Sulfa antibiotics   Review of Systems Review of Systems Per HPI  Physical Exam Triage Vital Signs ED Triage Vitals  Enc Vitals Group     BP 07/13/21 1805 (!) 143/87     Pulse Rate 07/13/21 1805 (!) 107     Resp 07/13/21 1805 20     Temp 07/13/21 1805 98.5 F (36.9 C)     Temp src --      SpO2 07/13/21 1805 98 %     Weight --      Height --      Head Circumference --      Peak Flow --      Pain Score 07/13/21 1804 8     Pain Loc --      Pain Edu? --      Excl. in GC? --    No data found.  Updated Vital Signs BP (!) 143/87   Pulse (!) 107   Temp 98.5 F (36.9 C)   Resp 20   LMP 05/30/2021   SpO2 98%   Visual Acuity Right Eye Distance:   Left Eye Distance:   Bilateral Distance:    Right Eye Near:   Left Eye Near:    Bilateral Near:     Physical Exam Vitals and nursing note reviewed.  Constitutional:      General: She is not in acute distress.    Appearance: Normal appearance.  HENT:     Head: Normocephalic.  Eyes:     Extraocular Movements: Extraocular movements intact.  Pupils:  Pupils are equal, round, and reactive to light.  Cardiovascular:     Rate and Rhythm: Tachycardia present.  Pulmonary:     Effort: Pulmonary effort is normal.  Musculoskeletal:     Left ankle:     Left Achilles Tendon: Tenderness present.     Left foot: Decreased range of motion. Normal capillary refill. Swelling and tenderness present. Normal pulse.     Comments: Tenderness noted to the calcaneus of the left foot.  Patient is unable to heel walk, but is able to walk on her tiptoes.  Swelling noted to the dorsal aspect of the left foot.  Skin:    General: Skin is warm and dry.  Neurological:     General: No focal deficit present.     Mental Status: She is alert and oriented to person, place, and time.  Psychiatric:        Mood and Affect: Mood normal.        Behavior: Behavior normal.      UC Treatments / Results  Labs (all labs ordered are listed, but only abnormal results are displayed) Labs Reviewed  CERVICOVAGINAL ANCILLARY ONLY    EKG   Radiology No results found.  Procedures Procedures (including critical care time)  Medications Ordered in UC Medications - No data to display  Initial Impression / Assessment and Plan / UC Course  I have reviewed the triage vital signs and the nursing notes.  Pertinent labs & imaging results that were available during my care of the patient were reviewed by me and considered in my medical decision making (see chart for details).  Patient presents with pain and swelling to the left foot.  Symptoms started this morning upon awakening.  On exam, patient has moderate tenderness to the left heel, she is unable to heel walk, but able to walk on her tiptoes.  Swelling is noted to the dorsal aspect of the left foot.  Symptoms are consistent with plantar fasciitis.  She has been wearing an older pair of crocs that do not have good support.  Patient is [redacted] weeks pregnant, so will avoid use of ibuprofen and prednisone for her symptoms.  Patient  was advised to take Tylenol for her pain.  Also recommended the use of a frozen water bottle or tennis ball to roll under the left foot.  Patient advised to change her shoes to those with better support and insoles.  With regard to her vaginal symptoms, patient would like to wait until her swab is back to ensure she is being treated with the appropriate medication given that she is [redacted] weeks pregnant.  Supportive care recommendations were provided to the patient to include eating yogurt daily.  Patient advised that she will be contacted if her cytology results are positive. Final Clinical Impressions(s) / UC Diagnoses   Final diagnoses:  Plantar fasciitis of left foot  Acute vaginitis     Discharge Instructions      Continue taking Tylenol as needed for pain at this time. As discussed, freeze a water bottle and use it to roll under the left foot while symptoms persist.  You can also use a tennis bottle. Recommend wearing shoes with better support as discussed. You can also continue using the brace that you are wearing for your foot and ankle. If your symptoms do not improve or continue to worsen, please follow-up with orthopedics.  If your cytology results are positive, you will be contacted and provided the appropriate treatment. Follow-up as  needed.     ED Prescriptions   None    PDMP not reviewed this encounter.   Tish Men, NP 07/13/21 1906

## 2021-07-13 NOTE — ED Triage Notes (Signed)
Pt presents with left foot and ankle pain that developed yesterday.pt denies injury, states that it hurt when she got up in the morning

## 2021-07-13 NOTE — ED Triage Notes (Signed)
Pt states she has vaginal irritation and has h/o frequent bv, not concerned with std

## 2021-07-14 LAB — CERVICOVAGINAL ANCILLARY ONLY
Bacterial Vaginitis (gardnerella): POSITIVE — AB
Candida Glabrata: NEGATIVE
Candida Vaginitis: POSITIVE — AB
Comment: NEGATIVE
Comment: NEGATIVE
Comment: NEGATIVE

## 2021-07-15 ENCOUNTER — Telehealth (HOSPITAL_COMMUNITY): Payer: Self-pay | Admitting: Emergency Medicine

## 2021-07-15 MED ORDER — METRONIDAZOLE 0.75 % VA GEL: 1.0000 | Freq: Every day | VAGINAL | 0 refills | Status: AC

## 2021-07-15 MED ORDER — CLOTRIMAZOLE 1 % VA CREA: 1.0000 | TOPICAL_CREAM | Freq: Every day | VAGINAL | 0 refills | Status: DC

## 2021-07-20 ENCOUNTER — Inpatient Hospital Stay (HOSPITAL_COMMUNITY)
Admission: AD | Admit: 2021-07-20 | Discharge: 2021-07-20 | Disposition: A | Discharge status: Home / Self Care | Payer: 59 | Attending: Obstetrics and Gynecology | Admitting: Obstetrics and Gynecology

## 2021-07-20 ENCOUNTER — Inpatient Hospital Stay (HOSPITAL_COMMUNITY): Payer: 59

## 2021-07-20 ENCOUNTER — Encounter (HOSPITAL_COMMUNITY): Payer: Self-pay | Admitting: *Deleted

## 2021-07-20 DIAGNOSIS — O10911 Unspecified pre-existing hypertension complicating pregnancy, first trimester: Secondary | ICD-10-CM | POA: Diagnosis not present

## 2021-07-20 DIAGNOSIS — O99011 Anemia complicating pregnancy, first trimester: Secondary | ICD-10-CM | POA: Diagnosis not present

## 2021-07-20 DIAGNOSIS — O99511 Diseases of the respiratory system complicating pregnancy, first trimester: Secondary | ICD-10-CM | POA: Diagnosis not present

## 2021-07-20 DIAGNOSIS — D649 Anemia, unspecified: Secondary | ICD-10-CM | POA: Diagnosis not present

## 2021-07-20 DIAGNOSIS — R059 Cough, unspecified: Secondary | ICD-10-CM | POA: Diagnosis not present

## 2021-07-20 DIAGNOSIS — Z3A01 Less than 8 weeks gestation of pregnancy: Secondary | ICD-10-CM | POA: Diagnosis not present

## 2021-07-20 DIAGNOSIS — J989 Respiratory disorder, unspecified: Secondary | ICD-10-CM | POA: Diagnosis not present

## 2021-07-20 DIAGNOSIS — O26891 Other specified pregnancy related conditions, first trimester: Secondary | ICD-10-CM | POA: Diagnosis present

## 2021-07-20 DIAGNOSIS — Z8709 Personal history of other diseases of the respiratory system: Secondary | ICD-10-CM | POA: Insufficient documentation

## 2021-07-20 DIAGNOSIS — Z20822 Contact with and (suspected) exposure to covid-19: Secondary | ICD-10-CM | POA: Diagnosis not present

## 2021-07-20 DIAGNOSIS — R0602 Shortness of breath: Secondary | ICD-10-CM | POA: Diagnosis not present

## 2021-07-20 DIAGNOSIS — I10 Essential (primary) hypertension: Secondary | ICD-10-CM

## 2021-07-20 LAB — CBC
HCT: 28.6 % — ABNORMAL LOW (ref 36.0–46.0)
Hemoglobin: 8.9 g/dL — ABNORMAL LOW (ref 12.0–15.0)
MCH: 22.3 pg — ABNORMAL LOW (ref 26.0–34.0)
MCHC: 31.1 g/dL (ref 30.0–36.0)
MCV: 71.5 fL — ABNORMAL LOW (ref 80.0–100.0)
Platelets: 573 10*3/uL — ABNORMAL HIGH (ref 150–400)
RBC: 4 MIL/uL (ref 3.87–5.11)
RDW: 18.2 % — ABNORMAL HIGH (ref 11.5–15.5)
WBC: 10.9 10*3/uL — ABNORMAL HIGH (ref 4.0–10.5)
nRBC: 0 % (ref 0.0–0.2)

## 2021-07-20 LAB — COMPREHENSIVE METABOLIC PANEL
ALT: 13 U/L (ref 0–44)
AST: 15 U/L (ref 15–41)
Albumin: 2.6 g/dL — ABNORMAL LOW (ref 3.5–5.0)
Alkaline Phosphatase: 66 U/L (ref 38–126)
Anion gap: 8 (ref 5–15)
BUN: 10 mg/dL (ref 6–20)
CO2: 21 mmol/L — ABNORMAL LOW (ref 22–32)
Calcium: 8.7 mg/dL — ABNORMAL LOW (ref 8.9–10.3)
Chloride: 106 mmol/L (ref 98–111)
Creatinine, Ser: 0.94 mg/dL (ref 0.44–1.00)
GFR, Estimated: >60 mL/min (ref >60)
Glucose, Bld: 94 mg/dL (ref 70–99)
Potassium: 3.8 mmol/L (ref 3.5–5.1)
Sodium: 135 mmol/L (ref 135–145)
Total Bilirubin: 0.2 mg/dL — ABNORMAL LOW (ref 0.3–1.2)
Total Protein: 7.6 g/dL (ref 6.5–8.1)

## 2021-07-20 LAB — RESP PANEL BY RT-PCR (FLU A&B, COVID) ARPGX2
Influenza A by PCR: NEGATIVE
Influenza B by PCR: NEGATIVE
SARS Coronavirus 2 by RT PCR: NEGATIVE

## 2021-07-20 MED ORDER — LACTATED RINGERS IV BOLUS
1000.0000 mL | Freq: Once | INTRAVENOUS | Status: AC
  Administered 2021-07-20: 1000 mL via INTRAVENOUS

## 2021-07-20 MED ORDER — ONDANSETRON HCL 4 MG/2ML IJ SOLN
4.0000 mg | Freq: Once | INTRAMUSCULAR | Status: AC
  Administered 2021-07-20: 4 mg via INTRAVENOUS
  Filled 2021-07-20: qty 2

## 2021-07-20 MED ORDER — POLYSACCHARIDE IRON COMPLEX 150 MG PO CAPS
150.0000 mg | ORAL_CAPSULE | Freq: Every day | ORAL | 6 refills | Status: DC
Start: 2021-07-20 — End: 2022-04-30

## 2021-07-20 MED ORDER — BENZONATATE 100 MG PO CAPS
200.0000 mg | ORAL_CAPSULE | Freq: Once | ORAL | Status: AC
  Administered 2021-07-20: 200 mg via ORAL
  Filled 2021-07-20: qty 2

## 2021-07-20 MED ORDER — DM-GUAIFENESIN ER 30-600 MG PO TB12
1.0000 | ORAL_TABLET | Freq: Two times a day (BID) | ORAL | Status: DC
  Administered 2021-07-20: 1 via ORAL
  Filled 2021-07-20: qty 1

## 2021-07-20 MED ORDER — NIFEDIPINE ER OSMOTIC RELEASE 30 MG PO TB24: 30.0000 mg | ORAL_TABLET | Freq: Every day | ORAL | 6 refills | Status: DC

## 2021-07-20 NOTE — MAU Note (Signed)
.  Nina Phillips is a 31 y.o. at [redacted]w[redacted]d here in MAU reporting: since Saturday has been having cough ,sob , fever and chest pressure. Feels like "I have pneumonia again." Had It last year and symptoms feel the same. Had a fever 100.2 on Saturday. Has taken inhaler form last time but really helping.   Onset of complaint: Saturday Pain score: 8 (chest pressure) Vitals:   07/20/21 1823  BP: (!) 150/82  Pulse: 83  Resp: 20  Temp: 98.7 F (37.1 C)  SpO2: 100%     FHT:n/a Lab orders placed from triage:

## 2021-07-20 NOTE — MAU Provider Note (Addendum)
History     CSN: [id number]  Arrival date and time: 07/20/21 1806    Chief Complaint  Patient presents with   Shortness of Breath   Cough   HPI This is a 31 year old G7P01 40 at 7 weeks and 2 days who presents with cough, shortness of breath, chest pain that started on Saturday and has been worsening.  She did have a fever on Saturday, but has not had a fever since then.  She has had multiple episodes of nausea and vomiting which is described as stomach contents.  Pain worse with coughing and deep inspiration.  OB History     Gravida  7   Para  1   Term      Preterm  1   AB  4   Living  0      SAB  4   IAB      Ectopic      Multiple  0   Live Births              Past Medical History:  Diagnosis Date   Abnormal uterine bleeding (AUB) 07/09/2019   Anemia    Anxiety    Bicornate uterus    Depression    seeing a therapist since 3rd miscarriage, helping, doing good.   Diabetes mellitus without complication ([place])    Type 2   Gout    History of miscarriage    Ovarian cyst    Trichomonas infection 08/24/2018   Treated 08/17/18    Past Surgical History:  Procedure Laterality Date   DILATION AND CURETTAGE OF UTERUS     WISDOM TOOTH EXTRACTION      Family History  Problem Relation Age of Onset   Hypertension Mother    Gout Mother    Diabetes Father    Hypertension Father    Stroke Father    Hypertension Maternal Aunt    Cancer Maternal Grandmother        breast   Hypertension Maternal Grandfather    Heart attack Maternal Grandfather    Stroke Paternal Grandmother    Diabetes Paternal Grandfather    Cancer Other    Hypertension Other    Diabetes Other     Social History   Tobacco Use   Smoking status: Former   Smokeless tobacco: Never   Tobacco comments:    quit 2014  Vaping Use   Vaping Use: Never used  Substance Use Topics   Alcohol use: No    Comment: occas   Drug use: Never    Allergies:  Allergies  Allergen Reactions    Apple Fruit Extract Hives   Banana Hives   Other Hives    Walnuts    Shellfish Allergy Hives   Tomato Hives   Watermelon [Citrullus Vulgaris] Hives   Bactrim [Sulfamethoxazole-Trimethoprim] Hives and Rash   Sulfa Antibiotics Hives and Rash    Medications Prior to Admission  Medication Sig Dispense Refill Last Dose   clotrimazole (GYNE-LOTRIMIN) 1 % vaginal cream Place 1 Applicatorful vaginally at bedtime. 45 g 0 07/19/2021   Heparin Sodium, Porcine, 5000 UNIT/0.5ML SOSY Inject 5,000 Units as directed 2 (two) times daily. 30 mL 6 07/20/2021   insulin glargine-yfgn (SEMGLEE, YFGN,) 100 UNIT/ML injection Inject 0.3 mLs (30 Units total) into the skin daily. 30 mL 3 07/20/2021   metFORMIN (GLUCOPHAGE-XR) 500 MG 24 hr tablet Take 2 tablets (1,000 mg total) by mouth in the morning and at bedtime. 360 tablet 3 07/20/2021   metroNIDAZOLE (METROGEL  VAGINAL) 0.75 % vaginal gel Place 1 Applicatorful vaginally at bedtime for 5 days. 50 g 0 07/20/2021   Prenatal MV & Min w/FA-DHA (PRENATAL GUMMIES PO) Take by mouth.   07/20/2021   Insulin Pen Needle 31G X 5 MM MISC 1 Device by Does not apply route daily. (Patient not taking: Reported on 07/10/2021) 150 each 3     Review of Systems Physical Exam   Blood pressure (!) 150/82, pulse 83, temperature 98.7 F (37.1 C), resp. rate 20, height 5\' 3"  (1.6 m), weight 261 lb (118.4 kg), last menstrual period 05/30/2021, SpO2 100 %.  Patient Vitals for the past 24 hrs:  BP Temp Pulse Resp SpO2 Height Weight  07/20/21 1823 (!) 150/82 98.7 F (37.1 C) 83 20 100 % 5\' 3"  (1.6 m) 261 lb (118.4 kg)     Physical Exam Vitals reviewed.  Constitutional:      Appearance: She is well-developed.  HENT:     Head: Normocephalic and atraumatic.  Cardiovascular:     Rate and Rhythm: Normal rate and regular rhythm.  Pulmonary:     Effort: Pulmonary effort is normal.     Breath sounds: Normal breath sounds.  Chest:     Chest wall: Tenderness (to sternum) present.   Skin:    General: Skin is warm and dry.     Capillary Refill: Capillary refill takes less than 2 seconds.  Neurological:     General: No focal deficit present.     Mental Status: She is alert.    Results for orders placed or performed during the hospital encounter of 07/20/21 (from the past 24 hour(s))  Resp Panel by RT-PCR (Flu A&B, Covid) Anterior Nasal Swab     Status: None   Collection Time: 07/20/21  7:17 PM   Specimen: Anterior Nasal Swab  Result Value Ref Range   SARS Coronavirus 2 by RT PCR NEGATIVE NEGATIVE   Influenza A by PCR NEGATIVE NEGATIVE   Influenza B by PCR NEGATIVE NEGATIVE  CBC     Status: Abnormal   Collection Time: 07/20/21  7:50 PM  Result Value Ref Range   WBC 10.9 (H) 4.0 - 10.5 K/uL   RBC 4.00 3.87 - 5.11 MIL/uL   Hemoglobin 8.9 (L) 12.0 - 15.0 g/dL   HCT 07/22/21 (L) 07/22/21 - 16.1 %   MCV 71.5 (L) 80.0 - 100.0 fL   MCH 22.3 (L) 26.0 - 34.0 pg   MCHC 31.1 30.0 - 36.0 g/dL   RDW 09.6 (H) 04.5 - 40.9 %   Platelets 573 (H) 150 - 400 K/uL   nRBC 0.0 0.0 - 0.2 %  Comprehensive metabolic panel     Status: Abnormal   Collection Time: 07/20/21  7:50 PM  Result Value Ref Range   Sodium 135 135 - 145 mmol/L   Potassium 3.8 3.5 - 5.1 mmol/L   Chloride 106 98 - 111 mmol/L   CO2 21 (L) 22 - 32 mmol/L   Glucose, Bld 94 70 - 99 mg/dL   BUN 10 6 - 20 mg/dL   Creatinine, Ser 91.4 0.44 - 1.00 mg/dL   Calcium 8.7 (L) 8.9 - 10.3 mg/dL   Total Protein 7.6 6.5 - 8.1 g/dL   Albumin 2.6 (L) 3.5 - 5.0 g/dL   AST 15 15 - 41 U/L   ALT 13 0 - 44 U/L   Alkaline Phosphatase 66 38 - 126 U/L   Total Bilirubin 0.2 (L) 0.3 - 1.2 mg/dL   GFR, Estimated 07/22/21 7.82  mL/min   Anion gap 8 5 - 15   MDM/MAU Course   Check CBC, CMP, CXR.  Will give IVF and zofran for nausea.  Care turned over to [facility], CNM Bernerd Limbo, [place] 07/20/2021 9:20 PM  Assumed care of patient and reassessed status - respiratory panel negative for flu/covid. CXR normal. Nausea somewhat better but  still present due to coughing. Mucinex-D and tessalon perles ordered.  Pt feeling better and stable to go home. Discussed hypertensive readings with her, pt reported she always has high readings at hospitals/MD offices but they are usually normal at home. Endorses higher anxiety with this pregnancy due to the loss. Given her repeated high readings, used shared decision making to start pt on procardia 30mg  XL daily (discussed med choice with Dr. ) with BP check at her new OB appt in two weeks.  Assessment and Plan  Upper respiratory illness at [redacted]w[redacted]d  Anemia in pregnancy, first trimester - iron polysaccharides sent to pharmacy Chronic hypertension, first trimester - procardia 30mg  XL daily sent to pharmacy NST reactive   Follow up on July 16th as [facility] at [facility] in stable condition with return precautions  Allergies as of 07/20/2021       Reactions   Apple Fruit Extract Hives   Banana Hives   Other Hives   Walnuts   Shellfish Allergy Hives   Tomato Hives   Watermelon [citrullus Vulgaris] Hives   Bactrim [sulfamethoxazole-trimethoprim] Hives, Rash   Sulfa Antibiotics Hives, Rash        Medication List     TAKE these medications    clotrimazole 1 % vaginal cream Commonly known as: GYNE-LOTRIMIN Place 1 Applicatorful vaginally at bedtime.   Heparin Sodium (Porcine) 5000 UNIT/0.5ML Sosy Inject 5,000 Units as directed 2 (two) times daily.   insulin glargine-yfgn 100 UNIT/ML injection Commonly known as: Semglee (yfgn) Inject 0.3 mLs (30 Units total) into the skin daily.   Insulin Pen Needle 31G X 5 MM Misc 1 Device by Does not apply route daily.   iron polysaccharides 150 MG capsule Commonly known as: NIFEREX Take 1 capsule (150 mg total) by mouth daily.   metFORMIN 500 MG 24 hr tablet Commonly known as: GLUCOPHAGE-XR Take 2 tablets (1,000 mg total) by mouth in the morning and at bedtime.   metroNIDAZOLE 0.75 % vaginal gel Commonly  known as: METROGEL VAGINAL Place 1 Applicatorful vaginally at bedtime for 5 days.   NIFEdipine 30 MG 24 hr tablet Commonly known as: Procardia XL Take 1 tablet (30 mg total) by mouth daily.   PRENATAL GUMMIES PO Take by mouth.       [facility], CNM, MSN, IBCLC Certified Nurse Midwife, [facility]

## 2021-08-07 ENCOUNTER — Other Ambulatory Visit: Payer: 59

## 2021-08-11 ENCOUNTER — Other Ambulatory Visit: Payer: Self-pay

## 2021-08-11 ENCOUNTER — Inpatient Hospital Stay (HOSPITAL_COMMUNITY)
Admission: AD | Admit: 2021-08-11 | Discharge: 2021-08-11 | Disposition: A | Discharge status: Home / Self Care | Payer: BLUE CROSS/BLUE SHIELD | Attending: Obstetrics and Gynecology | Admitting: Obstetrics and Gynecology

## 2021-08-11 DIAGNOSIS — Z3A10 10 weeks gestation of pregnancy: Secondary | ICD-10-CM | POA: Diagnosis not present

## 2021-08-11 DIAGNOSIS — O4691 Antepartum hemorrhage, unspecified, first trimester: Secondary | ICD-10-CM | POA: Insufficient documentation

## 2021-08-11 DIAGNOSIS — O2621 Pregnancy care for patient with recurrent pregnancy loss, first trimester: Secondary | ICD-10-CM | POA: Diagnosis not present

## 2021-08-11 DIAGNOSIS — O4690 Antepartum hemorrhage, unspecified, unspecified trimester: Secondary | ICD-10-CM

## 2021-08-11 LAB — URINALYSIS, ROUTINE W REFLEX MICROSCOPIC
Bacteria, UA: NONE SEEN
Bilirubin Urine: NEGATIVE
Glucose, UA: NEGATIVE mg/dL
Ketones, ur: NEGATIVE mg/dL
Leukocytes,Ua: NEGATIVE
Nitrite: NEGATIVE
Protein, ur: 100 mg/dL — AB
Specific Gravity, Urine: 1.014 (ref 1.005–1.030)
pH: 6 (ref 5.0–8.0)

## 2021-08-11 LAB — CBC
HCT: 32.1 % — ABNORMAL LOW (ref 36.0–46.0)
Hemoglobin: 10.1 g/dL — ABNORMAL LOW (ref 12.0–15.0)
MCH: 22.1 pg — ABNORMAL LOW (ref 26.0–34.0)
MCHC: 31.5 g/dL (ref 30.0–36.0)
MCV: 70.2 fL — ABNORMAL LOW (ref 80.0–100.0)
Platelets: 408 10*3/uL — ABNORMAL HIGH (ref 150–400)
RBC: 4.57 MIL/uL (ref 3.87–5.11)
RDW: 19.1 % — ABNORMAL HIGH (ref 11.5–15.5)
WBC: 9 10*3/uL (ref 4.0–10.5)
nRBC: 0 % (ref 0.0–0.2)

## 2021-08-11 NOTE — MAU Note (Signed)
Nina Phillips is a 31 y.o. at [redacted]w[redacted]d here in MAU reporting: she's having VB and back pain.  Reports VB is dark brown and is with wiping.  Reports last intercourse this morning @ 0500. States back began to hurt this morning while driving to work.  Reports hasn't taken any meds for the back pain.  Onset of complaint: today Pain score: 7 Vitals:   08/11/21 0940  BP: 138/78  Pulse: 72  Resp: 20  Temp: 98.1 F (36.7 C)  SpO2: 100%     FHT:180 bpm Lab orders placed from triage:   UA

## 2021-08-11 NOTE — MAU Provider Note (Addendum)
History     CSN: [id number]  Arrival date and time: 08/11/21 0859   None     Chief Complaint  Patient presents with   Vaginal Bleeding   Back Pain   31 year old G7P0140 @[redacted]w[redacted]d  by LMP presenting for vaginal bleeding and back pain. She reports having intercourse around 0500 this morning. Her pain started when she was driving to work. PMHx includes several miscarriages. She is on heparin for recurrent pregnancy loss.   Vaginal Bleeding Associated symptoms include back pain. Pertinent negatives include no chills or fever. Her menstrual history has been regular. Her past medical history is significant for miscarriage.    OB History     Gravida  7   Para  1   Term      Preterm  1   AB  4   Living  0      SAB  4   IAB      Ectopic      Multiple  0   Live Births              Past Medical History:  Diagnosis Date   Abnormal uterine bleeding (AUB) 07/09/2019   Anemia    Anxiety    Bicornate uterus    Depression    seeing a therapist since 3rd miscarriage, helping, doing good.   Diabetes mellitus without complication ([facility])    Type 2   Gout    History of miscarriage    Ovarian cyst    Trichomonas infection 08/24/2018   Treated 08/17/18    Past Surgical History:  Procedure Laterality Date   DILATION AND CURETTAGE OF UTERUS     WISDOM TOOTH EXTRACTION      Family History  Problem Relation Age of Onset   Hypertension Mother    Gout Mother    Diabetes Father    Hypertension Father    Stroke Father    Hypertension Maternal Aunt    Cancer Maternal Grandmother        breast   Hypertension Maternal Grandfather    Heart attack Maternal Grandfather    Stroke Paternal Grandmother    Diabetes Paternal Grandfather    Cancer Other    Hypertension Other    Diabetes Other     Social History   Tobacco Use   Smoking status: Former   Smokeless tobacco: Never   Tobacco comments:    quit 2014  Vaping Use   Vaping Use: Never used  Substance Use  Topics   Alcohol use: No    Comment: occas   Drug use: Never    Allergies:  Allergies  Allergen Reactions   Apple Fruit Extract Hives   Banana Hives   Other Hives    Walnuts    Shellfish Allergy Hives   Tomato Hives   Watermelon [Citrullus Vulgaris] Hives   Bactrim [Sulfamethoxazole-Trimethoprim] Hives and Rash   Sulfa Antibiotics Hives and Rash    Medications Prior to Admission  Medication Sig Dispense Refill Last Dose   clotrimazole (GYNE-LOTRIMIN) 1 % vaginal cream Place 1 Applicatorful vaginally at bedtime. 45 g 0    Heparin Sodium, Porcine, 5000 UNIT/0.5ML SOSY Inject 5,000 Units as directed 2 (two) times daily. 30 mL 6    insulin glargine-yfgn (SEMGLEE, YFGN,) 100 UNIT/ML injection Inject 0.3 mLs (30 Units total) into the skin daily. 30 mL 3    Insulin Pen Needle 31G X 5 MM MISC 1 Device by Does not apply route daily. (Patient not taking: Reported on 07/10/2021)  150 each 3    iron polysaccharides (NIFEREX) 150 MG capsule Take 1 capsule (150 mg total) by mouth daily. 30 capsule 6    metFORMIN (GLUCOPHAGE-XR) 500 MG 24 hr tablet Take 2 tablets (1,000 mg total) by mouth in the morning and at bedtime. 360 tablet 3    NIFEdipine (PROCARDIA XL) 30 MG 24 hr tablet Take 1 tablet (30 mg total) by mouth daily. 30 tablet 6    Prenatal MV & Min w/FA-DHA (PRENATAL GUMMIES PO) Take by mouth.       Review of Systems  Constitutional:  Negative for chills and fever.  Genitourinary:  Positive for vaginal bleeding.  Musculoskeletal:  Positive for back pain.   Physical Exam   Blood pressure 138/78, pulse 72, temperature 98.1 F (36.7 C), temperature source Oral, resp. rate 20, height 5\' 3"  (1.6 m), weight 116.2 kg, last menstrual period 05/30/2021, SpO2 100 %.  Physical Exam Constitutional:      General: She is not in acute distress.    Appearance: Normal appearance. She is not ill-appearing.  Cardiovascular:     Rate and Rhythm: Normal rate and regular rhythm.  Pulmonary:      Effort: Pulmonary effort is normal.     Breath sounds: Normal breath sounds.  Abdominal:     General: There is no distension.     Palpations: Abdomen is soft.     Tenderness: There is no abdominal tenderness.  Skin:    General: Skin is warm and dry.  Neurological:     General: No focal deficit present.     Mental Status: She is alert and oriented to person, place, and time. Mental status is at baseline.     MAU Course  Procedures  MDM 31 y/o G7P0140 at [redacted]w[redacted]d by LMP presenting to the MAU for vaginal bleeding and back pain. She had intercourse this morning around 0500 and several hours later she reports back pain and minor vaginal bleeding. RN was able to find fetal heart tones at 180 bpm. CBC resulted with chronic anemia (Hgb 10.1) which she is already taking PO iron. She has an appointment Aug 4th for her initial OB visit. With reassuring fetal heart tones and CBC WNL, patient to be discharged home with outpatient follow up.   Assessment and Plan   1. Vaginal bleeding during pregnancy     Vaginal Bleeding  Back pain  - Follow up with OB office Aug 4th - Cont PO iron - Cont heparin    Return precautions: increased vaginal bleeding (I.e. filling a pad), fever >100.4, headaches.    Aug 6 08/11/2021, 10:46 AM   Attestation of Supervision of Student:  I confirm that I have verified the information documented in the  resident  student's note and that I have also personally reperformed the history, physical exam and all medical decision making activities.  I have verified that all services and findings are accurately documented in this student's note; and I agree with management and plan as outlined in the documentation. I have also made any necessary editorial changes.  -IUP with  heart tones, VB most likely related to intercourse this morning -wet prep and GC cultures not done -patient to keep follow up appt, continue PO iron and heparin  08/13/2021,  CNM [name], [facility] 08/11/2021 10:53 AM

## 2021-08-27 ENCOUNTER — Other Ambulatory Visit: Payer: Self-pay | Admitting: Obstetrics & Gynecology

## 2021-08-27 DIAGNOSIS — Z3682 Encounter for antenatal screening for nuchal translucency: Secondary | ICD-10-CM

## 2021-08-28 ENCOUNTER — Ambulatory Visit: Payer: BLUE CROSS/BLUE SHIELD | Admitting: *Deleted

## 2021-08-28 ENCOUNTER — Encounter: Payer: Self-pay | Admitting: Advanced Practice Midwife

## 2021-08-28 ENCOUNTER — Ambulatory Visit (INDEPENDENT_AMBULATORY_CARE_PROVIDER_SITE_OTHER): Payer: BLUE CROSS/BLUE SHIELD | Admitting: Advanced Practice Midwife

## 2021-08-28 ENCOUNTER — Ambulatory Visit (INDEPENDENT_AMBULATORY_CARE_PROVIDER_SITE_OTHER): Payer: BLUE CROSS/BLUE SHIELD

## 2021-08-28 VITALS — BP 134/79 | HR 75 | Wt 250.4 lb

## 2021-08-28 DIAGNOSIS — Z363 Encounter for antenatal screening for malformations: Secondary | ICD-10-CM

## 2021-08-28 DIAGNOSIS — Z3481 Encounter for supervision of other normal pregnancy, first trimester: Secondary | ICD-10-CM

## 2021-08-28 DIAGNOSIS — Z3A12 12 weeks gestation of pregnancy: Secondary | ICD-10-CM

## 2021-08-28 DIAGNOSIS — O10919 Unspecified pre-existing hypertension complicating pregnancy, unspecified trimester: Secondary | ICD-10-CM

## 2021-08-28 DIAGNOSIS — N96 Recurrent pregnancy loss: Secondary | ICD-10-CM

## 2021-08-28 DIAGNOSIS — O0991 Supervision of high risk pregnancy, unspecified, first trimester: Secondary | ICD-10-CM

## 2021-08-28 DIAGNOSIS — Z794 Long term (current) use of insulin: Secondary | ICD-10-CM

## 2021-08-28 DIAGNOSIS — Z113 Encounter for screening for infections with a predominantly sexual mode of transmission: Secondary | ICD-10-CM

## 2021-08-28 DIAGNOSIS — Z3682 Encounter for antenatal screening for nuchal translucency: Secondary | ICD-10-CM | POA: Diagnosis not present

## 2021-08-28 DIAGNOSIS — R809 Proteinuria, unspecified: Secondary | ICD-10-CM

## 2021-08-28 DIAGNOSIS — O24111 Pre-existing diabetes mellitus, type 2, in pregnancy, first trimester: Secondary | ICD-10-CM

## 2021-08-28 DIAGNOSIS — E1129 Type 2 diabetes mellitus with other diabetic kidney complication: Secondary | ICD-10-CM

## 2021-08-28 LAB — POCT URINALYSIS DIPSTICK OB
Blood, UA: NEGATIVE
Glucose, UA: NEGATIVE
Ketones, UA: NEGATIVE
Leukocytes, UA: NEGATIVE
Nitrite, UA: NEGATIVE

## 2021-08-28 NOTE — Patient Instructions (Signed)
Nina Phillips, thank you for choosing our office today! We appreciate the opportunity to meet your healthcare needs. You may receive a short survey by mail, e-mail, or through [employer]. If you are happy with your care we would appreciate if you could take just a few minutes to complete the survey questions. We read all of your comments and take your feedback very seriously. Thank you again for choosing our office.  [facility] for [employer] [facility] at [facility]  [facility] at [facility] ([address] [place], [place] [address]) Entrance C, located off of [address] Free 24/7 valet parking   Nausea & Vomiting Have saltine crackers or pretzels by your bed and eat a few bites before you raise your head out of bed in the morning Eat small frequent meals throughout the day instead of large meals Drink plenty of fluids throughout the day to stay hydrated, just don't drink a lot of fluids with your meals.  This can make your stomach fill up faster making you feel sick Do not brush your teeth right after you eat Products with real ginger are good for nausea, like ginger ale and ginger hard candy Make sure it says made with real ginger! Sucking on sour candy like lemon heads is also good for nausea If your prenatal vitamins make you nauseated, take them at night so you will sleep through the nausea Sea Bands If you feel like you need medicine for the nausea & vomiting please let us know If you are unable to keep any fluids or food down please let us know   Constipation Drink plenty of fluid, preferably water, throughout the day Eat foods high in fiber such as fruits, vegetables, and grains Exercise, such as walking, is a good way to keep your bowels regular Drink warm fluids, especially warm prune juice, or decaf coffee Eat a 1/2 cup of real oatmeal (not instant), 1/2 cup applesauce, and 1/2-1 cup warm prune juice every day If needed, you may take Colace (docusate sodium) stool  softener once or twice a day to help keep the stool soft.  If you still are having problems with constipation, you may take Miralax once daily as needed to help keep your bowels regular.   Home Blood Pressure Monitoring for Patients   Your provider has recommended that you check your blood pressure (BP) at least once a week at home. If you do not have a blood pressure cuff at home, one will be provided for you. Contact your provider if you have not received your monitor within 1 week.   Helpful Tips for Accurate Home Blood Pressure Checks  Don't smoke, exercise, or drink caffeine 30 minutes before checking your BP Use the restroom before checking your BP (a full bladder can raise your pressure) Relax in a comfortable upright chair Feet on the ground Left arm resting comfortably on a flat surface at the level of your heart Legs uncrossed Back supported Sit quietly and don't talk Place the cuff on your bare arm Adjust snuggly, so that only two fingertips can fit between your skin and the top of the cuff Check 2 readings separated by at least one minute Keep a log of your BP readings For a visual, please reference this diagram: http://ccnc.care/bpdiagram  Provider Name: [facility]     Phone: [phone]  Zone 1: ALL CLEAR  Continue to monitor your symptoms:  BP reading is less than 140 (top number) or less than 90 (bottom  number)  No right upper stomach pain No headaches or seeing spots No feeling nauseated or throwing up No swelling in face and hands  Zone 2: CAUTION Call your doctor's office for any of the following:  BP reading is greater than 140 (top number) or greater than 90 (bottom number)  Stomach pain under your ribs in the middle or right side Headaches or seeing spots Feeling nauseated or throwing up Swelling in face and hands  Zone 3: EMERGENCY  Seek immediate medical care if you have any of the following:  BP reading is greater than160 (top number) or  greater than 110 (bottom number) Severe headaches not improving with Tylenol Serious difficulty catching your breath Any worsening symptoms from Zone 2    First Trimester of Pregnancy The first trimester of pregnancy is from week 1 until the end of week 12 (months 1 through 3). A week after a sperm fertilizes an egg, the egg will implant on the wall of the uterus. This embryo will begin to develop into a baby. Genes from you and your partner are forming the baby. The female genes determine whether the baby is a boy or a girl. At 6-8 weeks, the eyes and face are formed, and the heartbeat can be seen on ultrasound. At the end of 12 weeks, all the baby's organs are formed.  Now that you are pregnant, you will want to do everything you can to have a healthy baby. Two of the most important things are to get good prenatal care and to follow your health care provider's instructions. Prenatal care is all the medical care you receive before the baby's birth. This care will help prevent, find, and treat any problems during the pregnancy and childbirth. BODY CHANGES Your body goes through many changes during pregnancy. The changes vary from woman to woman.  You may gain or lose a couple of pounds at first. You may feel sick to your stomach (nauseous) and throw up (vomit). If the vomiting is uncontrollable, call your health care provider. You may tire easily. You may develop headaches that can be relieved by medicines approved by your health care provider. You may urinate more often. Painful urination may mean you have a bladder infection. You may develop heartburn as a result of your pregnancy. You may develop constipation because certain hormones are causing the muscles that push waste through your intestines to slow down. You may develop hemorrhoids or swollen, bulging veins (varicose veins). Your breasts may begin to grow larger and become tender. Your nipples may stick out more, and the tissue that  surrounds them (areola) may become darker. Your gums may bleed and may be sensitive to brushing and flossing. Dark spots or blotches (chloasma, mask of pregnancy) may develop on your face. This will likely fade after the baby is born. Your menstrual periods will stop. You may have a loss of appetite. You may develop cravings for certain kinds of food. You may have changes in your emotions from day to day, such as being excited to be pregnant or being concerned that something may go wrong with the pregnancy and baby. You may have more vivid and strange dreams. You may have changes in your hair. These can include thickening of your hair, rapid growth, and changes in texture. Some women also have hair loss during or after pregnancy, or hair that feels dry or thin. Your hair will most likely return to normal after your baby is born. WHAT TO EXPECT AT YOUR PRENATAL  VISITS During a routine prenatal visit: You will be weighed to make sure you and the baby are growing normally. Your blood pressure will be taken. Your abdomen will be measured to track your baby's growth. The fetal heartbeat will be listened to starting around week 10 or 12 of your pregnancy. Test results from any previous visits will be discussed. Your health care provider may ask you: How you are feeling. If you are feeling the baby move. If you have had any abnormal symptoms, such as leaking fluid, bleeding, severe headaches, or abdominal cramping. If you have any questions. Other tests that may be performed during your first trimester include: Blood tests to find your blood type and to check for the presence of any previous infections. They will also be used to check for low iron levels (anemia) and Rh antibodies. Later in the pregnancy, blood tests for diabetes will be done along with other tests if problems develop. Urine tests to check for infections, diabetes, or protein in the urine. An ultrasound to confirm the proper growth  and development of the baby. An amniocentesis to check for possible genetic problems. Fetal screens for spina bifida and Down syndrome. You may need other tests to make sure you and the baby are doing well. HOME CARE INSTRUCTIONS  Medicines Follow your health care provider's instructions regarding medicine use. Specific medicines may be either safe or unsafe to take during pregnancy. Take your prenatal vitamins as directed. If you develop constipation, try taking a stool softener if your health care provider approves. Diet Eat regular, well-balanced meals. Choose a variety of foods, such as meat or vegetable-based protein, fish, milk and low-fat dairy products, vegetables, fruits, and whole grain breads and cereals. Your health care provider will help you determine the amount of weight gain that is right for you. Avoid raw meat and uncooked cheese. These carry germs that can cause birth defects in the baby. Eating four or five small meals rather than three large meals a day may help relieve nausea and vomiting. If you start to feel nauseous, eating a few soda crackers can be helpful. Drinking liquids between meals instead of during meals also seems to help nausea and vomiting. If you develop constipation, eat more high-fiber foods, such as fresh vegetables or fruit and whole grains. Drink enough fluids to keep your urine clear or pale yellow. Activity and Exercise Exercise only as directed by your health care provider. Exercising will help you: Control your weight. Stay in shape. Be prepared for labor and delivery. Experiencing pain or cramping in the lower abdomen or low back is a good sign that you should stop exercising. Check with your health care provider before continuing normal exercises. Try to avoid standing for long periods of time. Move your legs often if you must stand in one place for a long time. Avoid heavy lifting. Wear low-heeled shoes, and practice good posture. You may  continue to have sex unless your health care provider directs you otherwise. Relief of Pain or Discomfort Wear a good support bra for breast tenderness.   Take warm sitz baths to soothe any pain or discomfort caused by hemorrhoids. Use hemorrhoid cream if your health care provider approves.   Rest with your legs elevated if you have leg cramps or low back pain. If you develop varicose veins in your legs, wear support hose. Elevate your feet for 15 minutes, 3-4 times a day. Limit salt in your diet. Prenatal Care Schedule your prenatal visits by the  twelfth week of pregnancy. They are usually scheduled monthly at first, then more often in the last 2 months before delivery. Write down your questions. Take them to your prenatal visits. Keep all your prenatal visits as directed by your health care provider. Safety Wear your seat belt at all times when driving. Make a list of emergency phone numbers, including numbers for family, friends, the hospital, and police and fire departments. General Tips Ask your health care provider for a referral to a local prenatal education class. Begin classes no later than at the beginning of month 6 of your pregnancy. Ask for help if you have counseling or nutritional needs during pregnancy. Your health care provider can offer advice or refer you to specialists for help with various needs. Do not use hot tubs, steam rooms, or saunas. Do not douche or use tampons or scented sanitary pads. Do not cross your legs for long periods of time. Avoid cat litter boxes and soil used by cats. These carry germs that can cause birth defects in the baby and possibly loss of the fetus by miscarriage or stillbirth. Avoid all smoking, herbs, alcohol, and medicines not prescribed by your health care provider. Chemicals in these affect the formation and growth of the baby. Schedule a dentist appointment. At home, brush your teeth with a soft toothbrush and be gentle when you floss. SEEK  MEDICAL CARE IF:  You have dizziness. You have mild pelvic cramps, pelvic pressure, or nagging pain in the abdominal area. You have persistent nausea, vomiting, or diarrhea. You have a bad smelling vaginal discharge. You have pain with urination. You notice increased swelling in your face, hands, legs, or ankles. SEEK IMMEDIATE MEDICAL CARE IF:  You have a fever. You are leaking fluid from your vagina. You have spotting or bleeding from your vagina. You have severe abdominal cramping or pain. You have rapid weight gain or loss. You vomit blood or material that looks like coffee grounds. You are exposed to [name] measles and have never had them. You are exposed to fifth disease or chickenpox. You develop a severe headache. You have shortness of breath. You have any kind of trauma, such as from a fall or a car accident. Document Released: 01/05/2001 Document Revised: 05/28/2013 Document Reviewed: 11/21/2012 [facility] Patient Information 2015 [place], [place]. This information is not intended to replace advice given to you by your health care provider. Make sure you discuss any questions you have with your health care provider.

## 2021-08-28 NOTE — Progress Notes (Signed)
[name] 12+6 wks,CRL 53.27 mm,NB present,NT 1.2 mm,FHR 170 bpm,bicornuate uterus,IUP right,normal ovaries, subserosal fibroid anterior left 2.3 x 1.3 x 1.4 cm

## 2021-08-28 NOTE — Progress Notes (Signed)
INITIAL OBSTETRICAL VISIT Patient name: Naijah Lacek MRN [id number]  Date of birth: 07-Jan-1991 Chief Complaint:   Initial Prenatal Visit  History of Present Illness:   Zoi Devine is a 31 y.o. G26P0150 African-American female at 26w6dby LMP c/w u/s at 12.6 weeks (today) with an Estimated Date of Delivery: 03/06/22 being seen today for her initial obstetrical visit.   Patient's last menstrual period was 05/30/2021. Her obstetrical history is significant for  SAB x 5; IUFD @ 21wks .   Today she reports  having some N/V still, but it is improving and declines meds for now .  Last pap Dec 2021. Results were: NILM w/ HRHPV not done     08/28/2021    9:27 AM 03/20/2021   12:19 PM 12/13/2019    2:52 PM 11/28/2019    2:27 PM 10/10/2019   11:06 AM  Depression screen PHQ 2/9  Decreased Interest 1 0 0 1 0  Down, Depressed, Hopeless 0 0 0 1 0  PHQ - 2 Score 1 0 0 2 0  Altered sleeping 1 0 _0 Tired, decreased energy 1 1 0 0 0  Change in appetite 1 0 0 1 0  Feeling bad or failure about yourself  0 0 1 3 0  Trouble concentrating 0 0 0 1 0  Moving slowly or fidgety/restless 0 0 0 0 0  Suicidal thoughts 0 0 0 1 0  PHQ-9 Score _1 08/28/2021    9:28 AM 03/20/2021   12:20 PM 12/13/2019    2:51 PM 11/28/2019    2:31 PM  GAD 7 : Generalized Anxiety Score  Nervous, Anxious, on Edge 1 1 0 2  Control/stop worrying _2 Worry too much - different things _3 Trouble relaxing 0 0 0 3  Restless 0 0 0 1  Easily annoyed or irritable _4 Afraid - awful might happen 1 1 0 2  Total GAD 7 Score _5 Review of Systems:   Pertinent items are noted in HPI Denies cramping/contractions, leakage of fluid, vaginal bleeding, abnormal vaginal discharge w/ itching/odor/irritation, headaches, visual changes, shortness of breath, chest pain, abdominal pain, severe nausea/vomiting, or problems with urination or bowel movements unless otherwise stated above.  Pertinent  History Reviewed:  Reviewed past medical,surgical, social, obstetrical and family history.  Reviewed problem list, medications and allergies. OB History  Gravida Para Term Preterm AB Living  _6 0  SAB IAB Ectopic Multiple Live Births  5     0      # Outcome Date GA Lbr Len/2nd Weight Sex Delivery Anes PTL Lv  7 Current           6 SAB 02/2021          5 Preterm 11/11/19 281w2d5.3 oz (0.15 kg) M Vag-Spont EPI  FD  4 SAB 08/2018 1078w3d         Birth Comments: missed Ab, CRL c/w [id number]  3 SAB 03/2017          2 SAB 09/2016          1 SAB 07/2016           Physical Assessment:   Vitals:   08/28/21 1011  BP: 134/79  Pulse: 75  Weight: 250 lb  6.4 oz (113.6 kg)  Body mass index is 44.36 kg/m.       Physical Examination:  General appearance - well appearing, and in no distress  Mental status - alert, oriented to person, place, and time  Psych:  She has a normal mood and affect  Skin - warm and dry, normal color, no suspicious lesions noted  Chest - effort normal, all lung fields clear to auscultation bilaterally  Heart - normal rate and regular rhythm  Abdomen - soft, nontender  Extremities:  No swelling or varicosities noted  Pelvic - not indicated  Thin prep pap is not done    TODAY'S NT US 12+6 wks,CRL 53.27 mm,NB present,NT 1.2 mm,FHR 170 bpm,bicornuate uterus,IUP right,normal ovaries, subserosal fibroid anterior left 2.3 x 1.3 x 1.4 cm  Results for orders placed or performed in visit on 08/28/21 (from the past 24 hour(s))  POC Urinalysis Dipstick OB   Collection Time: 08/28/21  9:41 AM  Result Value Ref Range   Color, UA     Clarity, UA     Glucose, UA Negative Negative   Bilirubin, UA     Ketones, UA neg    Spec Grav, UA     Blood, UA neg    pH, UA     POC,PROTEIN,UA Small (1+) Negative, Trace, Small (1+), Moderate (2+), Large (3+), 4+   Urobilinogen, UA     Nitrite, UA neg    Leukocytes, UA Negative Negative   Appearance     Odor       Assessment & Plan:  1) High-Risk Pregnancy G7P0150 at [redacted]w[redacted]d with an Estimated Date of Delivery: 03/06/22   2) Initial OB visit  3) 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   Initial labs obtained Continue prenatal vitamins Reviewed n/v relief measures and warning s/s to report Reviewed recommended weight gain based on pre-gravid BMI Encouraged well-balanced diet Genetic & carrier screening discussed: requests Panorama and NT/IT, declines Horizon (neg 08/2019) Ultrasound discussed; fetal survey: requested [facility] completed> form faxed if has or is planning to apply for medicaid The nature of [facility] with multiple MDs and other Advanced Practice Providers was explained to patient; also emphasized that fellows, residents, and students are part of our team. Does have home bp cuff. Office bp cuff given: no. Rx sent: n/a. Check bp weekly, let us know if consistently >140/90.   Indications for ASA therapy- already taking Heparin 5000u bid for RPL (per uptodate)  Indications for early A1C- yes, to eval control on current regimen (per uptodate)  Follow-up: Return in about 3 weeks (around 09/18/2021) for 3wk HROB, 2nd IT, in person; then 7wk HROB and anatomy u/s.   Orders Placed This Encounter  Procedures   Urine Culture   GC/Chlamydia Probe Amp   US OB Comp + 14 Wk   CBC/D/Plt+RPR+Rh+ABO+RubIgG...   Comp Met (CMET)   Protein / creatinine ratio, urine   Integrated 1   Panorama Prenatal Test Full Panel   HgB A1c   POC Urinalysis Dipstick OB      Kimberly D Shaw  CNM 08/28/2021 10:53 AM  

## 2021-08-29 LAB — COMPREHENSIVE METABOLIC PANEL
ALT: 67 IU/L — ABNORMAL HIGH (ref 0–32)
AST: 28 IU/L (ref 0–40)
Albumin/Globulin Ratio: 0.9 — ABNORMAL LOW (ref 1.2–2.2)
Albumin: 3.7 g/dL — ABNORMAL LOW (ref 4.0–5.0)
Alkaline Phosphatase: 136 IU/L — ABNORMAL HIGH (ref 44–121)
BUN/Creatinine Ratio: 12 (ref 9–23)
BUN: 10 mg/dL (ref 6–20)
Bilirubin Total: 0.3 mg/dL (ref 0.0–1.2)
CO2: 16 mmol/L — ABNORMAL LOW (ref 20–29)
Calcium: 9.9 mg/dL (ref 8.7–10.2)
Chloride: 102 mmol/L (ref 96–106)
Creatinine, Ser: 0.85 mg/dL (ref 0.57–1.00)
Globulin, Total: 4.1 g/dL (ref 1.5–4.5)
Glucose: 90 mg/dL (ref 70–99)
Potassium: 4.6 mmol/L (ref 3.5–5.2)
Sodium: 136 mmol/L (ref 134–144)
Total Protein: 7.8 g/dL (ref 6.0–8.5)
eGFR: 95 mL/min/{1.73_m2} (ref >59)

## 2021-08-29 LAB — CBC/D/PLT+RPR+RH+ABO+RUBIGG...
Antibody Screen: NEGATIVE
Basophils Absolute: 0.1 10*3/uL (ref 0.0–0.2)
Basos: 1 %
EOS (ABSOLUTE): 0.1 10*3/uL (ref 0.0–0.4)
Eos: 1 %
HCV Ab: NONREACTIVE
HIV Screen 4th Generation wRfx: NONREACTIVE
Hematocrit: 35.1 % (ref 34.0–46.6)
Hemoglobin: 10.7 g/dL — ABNORMAL LOW (ref 11.1–15.9)
Hepatitis B Surface Ag: NEGATIVE
Immature Grans (Abs): 0 10*3/uL (ref 0.0–0.1)
Immature Granulocytes: 0 %
Lymphocytes Absolute: 3.3 10*3/uL — ABNORMAL HIGH (ref 0.7–3.1)
Lymphs: 40 %
MCH: 23 pg — ABNORMAL LOW (ref 26.6–33.0)
MCHC: 30.5 g/dL — ABNORMAL LOW (ref 31.5–35.7)
MCV: 76 fL — ABNORMAL LOW (ref 79–97)
Monocytes Absolute: 0.5 10*3/uL (ref 0.1–0.9)
Monocytes: 7 %
Neutrophils Absolute: 4.1 10*3/uL (ref 1.4–7.0)
Neutrophils: 51 %
Platelets: 407 10*3/uL (ref 150–450)
RBC: 4.65 x10E6/uL (ref 3.77–5.28)
RDW: 20.2 % — ABNORMAL HIGH (ref 11.7–15.4)
RPR Ser Ql: NONREACTIVE
Rh Factor: POSITIVE
Rubella Antibodies, IGG: 1.56 index (ref >0.99)
WBC: 8.1 10*3/uL (ref 3.4–10.8)

## 2021-08-29 LAB — PROTEIN / CREATININE RATIO, URINE
Creatinine, Urine: 84.5 mg/dL
Protein, Ur: 116.4 mg/dL
Protein/Creat Ratio: 1378 mg/g creat — ABNORMAL HIGH (ref 0–200)

## 2021-08-29 LAB — HEMOGLOBIN A1C
Est. average glucose Bld gHb Est-mCnc: 126 mg/dL
Hgb A1c MFr Bld: 6 % — ABNORMAL HIGH (ref 4.8–5.6)

## 2021-08-29 LAB — HCV INTERPRETATION

## 2021-08-30 ENCOUNTER — Encounter: Payer: Self-pay | Admitting: Advanced Practice Midwife

## 2021-08-30 ENCOUNTER — Other Ambulatory Visit: Payer: Self-pay | Admitting: Advanced Practice Midwife

## 2021-08-30 DIAGNOSIS — D509 Iron deficiency anemia, unspecified: Secondary | ICD-10-CM | POA: Insufficient documentation

## 2021-08-30 LAB — URINE CULTURE

## 2021-09-01 LAB — INTEGRATED 1
Crown Rump Length: 53.3 mm
Gest. Age on Collection Date: 11.9 weeks
Maternal Age at EDD: 29.7 yr
Nuchal Translucency (NT): 1.2 mm
Number of Fetuses: 1
PAPP-A Value: 290.8 ng/mL
Weight: 250 [lb_av]

## 2021-09-01 LAB — GC/CHLAMYDIA PROBE AMP
Chlamydia trachomatis, NAA: NEGATIVE
Neisseria Gonorrhoeae by PCR: NEGATIVE

## 2021-09-02 LAB — PANORAMA PRENATAL TEST FULL PANEL:PANORAMA TEST PLUS 5 ADDITIONAL MICRODELETIONS: FETAL FRACTION: 4.1

## 2021-09-03 ENCOUNTER — Ambulatory Visit
Admission: EM | Admit: 2021-09-03 | Discharge: 2021-09-03 | Disposition: A | Discharge status: Home / Self Care | Payer: BLUE CROSS/BLUE SHIELD | Attending: Urgent Care | Admitting: Urgent Care

## 2021-09-03 DIAGNOSIS — M109 Gout, unspecified: Secondary | ICD-10-CM | POA: Diagnosis not present

## 2021-09-03 DIAGNOSIS — Z3A13 13 weeks gestation of pregnancy: Secondary | ICD-10-CM

## 2021-09-03 DIAGNOSIS — E119 Type 2 diabetes mellitus without complications: Secondary | ICD-10-CM

## 2021-09-03 DIAGNOSIS — Z794 Long term (current) use of insulin: Secondary | ICD-10-CM

## 2021-09-03 MED ORDER — PREDNISONE 20 MG PO TABS: 40.0000 mg | ORAL_TABLET | Freq: Every day | ORAL | 0 refills | Status: DC

## 2021-09-03 NOTE — ED Triage Notes (Signed)
Pt states right foot pain since this morning. History of gout,  did not take anything for the pain today. Pt is [redacted] weeks pregnant.

## 2021-09-03 NOTE — ED Provider Notes (Signed)
[facility]   MRN: [id number] DOB: 1990/07/08  Subjective:   Nina Phillips is a 31 y.o. female presenting for acute onset of severe 10 out of 10 right foot/ankle pain with a hot sensation.  Patient has established diagnosis of gout and this is a site where it does occur.  Denies fall, trauma, bruising, wounds.  She is currently [redacted] weeks pregnant.  Has hypertension and is on medical therapy for this.  She is also on anticoagulation, has type 2 diabetes treated with insulin.  Has regular follow-up with her OB.  No current facility-administered medications for this encounter.  Current Outpatient Medications:    folic acid (FOLVITE) 1 MG tablet, Take 1 mg by mouth daily., Disp: , Rfl:    Heparin Sodium, Porcine, 5000 UNIT/0.5ML SOSY, Inject 5,000 Units as directed 2 (two) times daily., Disp: 30 mL, Rfl: 6   insulin glargine-yfgn (SEMGLEE, YFGN,) 100 UNIT/ML injection, Inject 0.3 mLs (30 Units total) into the skin daily., Disp: 30 mL, Rfl: 3   Insulin Pen Needle 31G X 5 MM MISC, 1 Device by Does not apply route daily., Disp: 150 each, Rfl: 3   iron polysaccharides (NIFEREX) 150 MG capsule, Take 1 capsule (150 mg total) by mouth daily., Disp: 30 capsule, Rfl: 6   metFORMIN (GLUCOPHAGE-XR) 500 MG 24 hr tablet, Take 2 tablets (1,000 mg total) by mouth in the morning and at bedtime., Disp: 360 tablet, Rfl: 3   NIFEdipine (PROCARDIA XL) 30 MG 24 hr tablet, Take 1 tablet (30 mg total) by mouth daily., Disp: 30 tablet, Rfl: 6   Prenatal MV & Min w/FA-DHA (PRENATAL GUMMIES PO), Take by mouth., Disp: , Rfl:    Allergies  Allergen Reactions   Apple Fruit Extract Hives   Banana Hives   Other Hives    Walnuts    Shellfish Allergy Hives   Tomato Hives   Watermelon [Citrullus Vulgaris] Hives   Bactrim [Sulfamethoxazole-Trimethoprim] Hives and Rash   Sulfa Antibiotics Hives and Rash    Past Medical History:  Diagnosis Date   Abnormal uterine bleeding (AUB) 07/09/2019   Anemia     Anxiety    Bicornate uterus    Depression    seeing a therapist since 3rd miscarriage, helping, doing good.   Diabetes mellitus without complication (HCC)    Type 2   Gout    History of miscarriage    Ovarian cyst    Trichomonas infection 08/24/2018   Treated 08/17/18     Past Surgical History:  Procedure Laterality Date   DILATION AND CURETTAGE OF UTERUS     WISDOM TOOTH EXTRACTION      Family History  Problem Relation Age of Onset   Hypertension Mother    Gout Mother    Diabetes Father    Hypertension Father    Stroke Father    Hypertension Maternal Aunt    Cancer Maternal Grandmother        breast   Hypertension Maternal Grandfather    Heart attack Maternal Grandfather    Stroke Paternal Grandmother    Diabetes Paternal Grandfather    Cancer Other    Hypertension Other    Diabetes Other     Social History   Tobacco Use   Smoking status: Former   Smokeless tobacco: Never   Tobacco comments:    quit 2014  Vaping Use   Vaping Use: Never used  Substance Use Topics   Alcohol use: No    Comment: occas   Drug use: Never  ROS   Objective:   Vitals: BP 126/80 (BP Location: Right Arm)   Pulse 94   Temp 98.8 F (37.1 C) (Oral)   Resp 17   LMP 05/30/2021   SpO2 99%   Physical Exam Constitutional:      General: She is not in acute distress.    Appearance: Normal appearance. She is well-developed. She is not ill-appearing, toxic-appearing or diaphoretic.  HENT:     Head: Normocephalic and atraumatic.     Nose: Nose normal.     Mouth/Throat:     Mouth: Mucous membranes are moist.  Eyes:     General: No scleral icterus.       Right eye: No discharge.        Left eye: No discharge.     Extraocular Movements: Extraocular movements intact.  Cardiovascular:     Rate and Rhythm: Normal rate.  Pulmonary:     Effort: Pulmonary effort is normal.  Musculoskeletal:     Right ankle: Swelling present. No deformity, ecchymosis or lacerations.  Tenderness (throughout anterior, medial and lateral ankle) present. Decreased range of motion.     Right Achilles Tendon: No tenderness or defects. Thompson's test negative.     Right foot: Decreased range of motion. Normal capillary refill. Tenderness (proximally at base of the ankle) present. No swelling, deformity, bunion, laceration, bony tenderness or crepitus.  Skin:    General: Skin is warm and dry.  Neurological:     General: No focal deficit present.     Mental Status: She is alert and oriented to person, place, and time.  Psychiatric:        Mood and Affect: Mood normal.        Behavior: Behavior normal.     Assessment and Plan :   PDMP not reviewed this encounter.  1. Acute gout of right ankle, unspecified cause   2. [redacted] weeks gestation of pregnancy   3. Type 2 diabetes mellitus treated with insulin (HCC)    Last A1c done this month was 6%.  Given the high suspicion for recurrent gout attack recommended an oral prednisone course which is the safest option in her pregnancy.  I advised that she follow-up with her obstetrician as soon as possible given her pregnancy and chronic conditions including diabetes through her pregnancy and essential hypertension.  Counseled patient on potential for adverse effects with medications prescribed/recommended today, ER and return-to-clinic precautions discussed, patient verbalized understanding.    Wallis Bamberg, [place] 09/03/21 3500

## 2021-09-14 ENCOUNTER — Other Ambulatory Visit: Payer: Self-pay

## 2021-09-14 ENCOUNTER — Inpatient Hospital Stay (HOSPITAL_COMMUNITY)
Admission: AD | Admit: 2021-09-14 | Discharge: 2021-09-14 | Disposition: A | Discharge status: Home / Self Care | Payer: BLUE CROSS/BLUE SHIELD | Attending: Family Medicine | Admitting: Family Medicine

## 2021-09-14 ENCOUNTER — Telehealth: Payer: Self-pay | Admitting: Women's Health

## 2021-09-14 DIAGNOSIS — D509 Iron deficiency anemia, unspecified: Secondary | ICD-10-CM

## 2021-09-14 DIAGNOSIS — O209 Hemorrhage in early pregnancy, unspecified: Secondary | ICD-10-CM | POA: Insufficient documentation

## 2021-09-14 DIAGNOSIS — O24112 Pre-existing diabetes mellitus, type 2, in pregnancy, second trimester: Secondary | ICD-10-CM | POA: Diagnosis not present

## 2021-09-14 DIAGNOSIS — Z3A15 15 weeks gestation of pregnancy: Secondary | ICD-10-CM | POA: Diagnosis not present

## 2021-09-14 DIAGNOSIS — O10912 Unspecified pre-existing hypertension complicating pregnancy, second trimester: Secondary | ICD-10-CM | POA: Insufficient documentation

## 2021-09-14 DIAGNOSIS — O4692 Antepartum hemorrhage, unspecified, second trimester: Secondary | ICD-10-CM | POA: Diagnosis not present

## 2021-09-14 DIAGNOSIS — O99012 Anemia complicating pregnancy, second trimester: Secondary | ICD-10-CM | POA: Diagnosis not present

## 2021-09-14 NOTE — MAU Provider Note (Signed)
History     CSN: [id number]  Arrival date and time: 09/14/21 1131     Chief Complaint  Patient presents with   Vaginal Bleeding   HPI This is a 31 year old G7P10150 and a 21-week IUFD, and is on heparin for recurrent pregnancy loss..  Pregnancy also complicated with chronic hypertension and type 2 diabetes.  Patient had intercourse yesterday evening, and passed a half dollar sized blood clot just prior to arrival.  She is not experiencing any vaginal bleeding. She denies cramping, leaking fluid, abnormal discharge.  She otherwise feels fine.  OB History     Gravida  7   Para  1   Term      Preterm  1   AB  5   Living  0      SAB  5   IAB      Ectopic      Multiple  0   Live Births              Past Medical History:  Diagnosis Date   Abnormal uterine bleeding (AUB) 07/09/2019   Anemia    Anxiety    Bicornate uterus    Depression    seeing a therapist since 3rd miscarriage, helping, doing good.   Diabetes mellitus without complication (HCC)    Type 2   Gout    History of miscarriage    Ovarian cyst    Trichomonas infection 08/24/2018   Treated 08/17/18    Past Surgical History:  Procedure Laterality Date   DILATION AND CURETTAGE OF UTERUS     WISDOM TOOTH EXTRACTION      Family History  Problem Relation Age of Onset   Hypertension Mother    Gout Mother    Diabetes Father    Hypertension Father    Stroke Father    Hypertension Maternal Aunt    Cancer Maternal Grandmother        breast   Hypertension Maternal Grandfather    Heart attack Maternal Grandfather    Stroke Paternal Grandmother    Diabetes Paternal Grandfather    Cancer Other    Hypertension Other    Diabetes Other     Social History   Tobacco Use   Smoking status: Former   Smokeless tobacco: Never   Tobacco comments:    quit 2014  Vaping Use   Vaping Use: Never used  Substance Use Topics   Alcohol use: No    Comment: occas   Drug use: Never    Allergies:   Allergies  Allergen Reactions   Apple Fruit Extract Hives   Banana Hives   Other Hives    Walnuts    Shellfish Allergy Hives   Tomato Hives   Watermelon [Citrullus Vulgaris] Hives   Bactrim [Sulfamethoxazole-Trimethoprim] Hives and Rash   Sulfa Antibiotics Hives and Rash    Medications Prior to Admission  Medication Sig Dispense Refill Last Dose   folic acid (FOLVITE) 1 MG tablet Take 1 mg by mouth daily.      Heparin Sodium, Porcine, 5000 UNIT/0.5ML SOSY Inject 5,000 Units as directed 2 (two) times daily. 30 mL 6    insulin glargine-yfgn (SEMGLEE, YFGN,) 100 UNIT/ML injection Inject 0.3 mLs (30 Units total) into the skin daily. 30 mL 3    Insulin Pen Needle 31G X 5 MM MISC 1 Device by Does not apply route daily. 150 each 3    iron polysaccharides (NIFEREX) 150 MG capsule Take 1 capsule (150 mg total) by mouth  daily. 30 capsule 6    metFORMIN (GLUCOPHAGE-XR) 500 MG 24 hr tablet Take 2 tablets (1,000 mg total) by mouth in the morning and at bedtime. 360 tablet 3    NIFEdipine (PROCARDIA XL) 30 MG 24 hr tablet Take 1 tablet (30 mg total) by mouth daily. 30 tablet 6    predniSONE (DELTASONE) 20 MG tablet Take 2 tablets (40 mg total) by mouth daily with breakfast. 10 tablet 0    Prenatal MV & Min w/FA-DHA (PRENATAL GUMMIES PO) Take by mouth.       Review of Systems Physical Exam   Blood pressure (!) 156/89, pulse 89, temperature (!) 97.4 F (36.3 C), temperature source Oral, resp. rate 19, height 5\' 3"  (1.6 m), weight 114.3 kg, last menstrual period 05/30/2021, SpO2 100 %.  Physical Exam Vitals reviewed.  Constitutional:      Appearance: Normal appearance.  Cardiovascular:     Rate and Rhythm: Normal rate.  Skin:    General: Skin is warm and dry.     Capillary Refill: Capillary refill takes less than 2 seconds.  Neurological:     General: No focal deficit present.     Mental Status: She is alert.  Psychiatric:        Mood and Affect: Mood normal.        Behavior: Behavior  normal.        Thought Content: Thought content normal.        Judgment: Judgment normal.     MAU Course  Procedures Pt informed that the ultrasound is considered a limited OB ultrasound and is not intended to be a complete ultrasound exam.  Patient also informed that the ultrasound is not being completed with the intent of assessing for fetal or placental anomalies or any pelvic abnormalities.  Explained that the purpose of today's ultrasound is to assess for  viability.  Patient acknowledges the purpose of the exam and the limitations of the study.    Intrauterine pregnancy with fetal heart rate of 178.  MDM   Assessment and Plan   1. Iron deficiency anemia, unspecified iron deficiency anemia type   2. [redacted] weeks gestation of pregnancy   3. Vaginal bleeding in pregnancy, second trimester    Patient reassured.  Pelvic rest for the next couple of weeks.  Patient has follow-up with OB office on Friday.  Return precautions for increased bleeding, passing several more blood clots.  We will continue with heparin.  Friday 09/14/2021, 12:18 PM

## 2021-09-14 NOTE — MAU Note (Signed)
Nina Phillips is a 31 y.o. at [redacted]w[redacted]d here in MAU reporting: she passed 1 moderate sized blood clot this morning, initially had some VB after passing clot but no longer bleeding.  Reports last intercourse yesterday morning.  Denies abdomen pain or cramping. LMP: N/A Onset of complaint: today Pain score: 0 Vitals:   09/14/21 1148  BP: (!) 156/89  Pulse: 89  Resp: 19  Temp: (!) 97.4 F (36.3 C)  SpO2: 100%     FHT:174 bpm via [name] by Dr. Adrian Blackwater Lab orders placed from triage:   None

## 2021-09-14 NOTE — Telephone Encounter (Signed)
Returned patient's call.  States she passed a large clot but has since went to the hospital and everything is fine.  Next appt is Friday. Advised to let us know if bleeding returns.  Pt verbalized understanding.

## 2021-09-14 NOTE — Telephone Encounter (Signed)
Patient states that she has past a big blood clot. No cramping, no other bleeding, wants to speak to someone

## 2021-09-18 ENCOUNTER — Ambulatory Visit (INDEPENDENT_AMBULATORY_CARE_PROVIDER_SITE_OTHER): Payer: BLUE CROSS/BLUE SHIELD | Admitting: Obstetrics & Gynecology

## 2021-09-18 ENCOUNTER — Encounter: Payer: Self-pay | Admitting: Obstetrics & Gynecology

## 2021-09-18 VITALS — Wt 251.0 lb

## 2021-09-18 DIAGNOSIS — O24111 Pre-existing diabetes mellitus, type 2, in pregnancy, first trimester: Secondary | ICD-10-CM

## 2021-09-18 DIAGNOSIS — O10919 Unspecified pre-existing hypertension complicating pregnancy, unspecified trimester: Secondary | ICD-10-CM

## 2021-09-18 DIAGNOSIS — O0992 Supervision of high risk pregnancy, unspecified, second trimester: Secondary | ICD-10-CM

## 2021-09-18 MED ORDER — "INSULIN SYRINGE-NEEDLE U-100 30G X 5/16"" 0.3 ML MISC": 1.0000 | Freq: Three times a day (TID) | 4 refills | Status: DC

## 2021-09-18 MED ORDER — INSULIN ASPART 100 UNIT/ML IJ SOLN: 2.0000 [IU] | Freq: Three times a day (TID) | INTRAMUSCULAR | 2 refills | Status: DC

## 2021-09-18 NOTE — Progress Notes (Signed)
HIGH-RISK PREGNANCY VISIT Patient name: Nina Phillips MRN [id number]  Date of birth: 1990-07-31 Chief Complaint:   Routine Prenatal Visit (Heartburn)  History of Present Illness:   Nina Phillips is a 31 y.o. G23P0150 female at [redacted]w[redacted]d with an Estimated Date of Delivery: 03/06/22 being seen today for ongoing management of a high-risk pregnancy complicated by:  T2DM -Metformin 1000mg  bid, semglee 30u at night -pt did not have sugar log.  She reports the following: -fasting- mostly 95s, though it sounds like occasional 100s -after meals 130-140s  Chronic HTN- on procardia XL 30mg  daily -has baby scripts, BPs 130/80s HSV2 H/o IUFD @ 21wk- on heparin 5000u bid  Today she reports no complaints.   Contractions: Not present. Vag. Bleeding: None.  Movement: Present. denies leaking of fluid.      08/28/2021    9:27 AM 03/20/2021   12:19 PM 12/13/2019    2:52 PM 11/28/2019    2:27 PM 10/10/2019   11:06 AM  Depression screen PHQ 2/9  Decreased Interest 1 0 0 1 0  Down, Depressed, Hopeless 0 0 0 1 0  PHQ - 2 Score 1 0 0 2 0  Altered sleeping 1 0 1 1 1   Tired, decreased energy 1 1 0 0 0  Change in appetite 1 0 0 1 0  Feeling bad or failure about yourself  0 0 1 3 0  Trouble concentrating 0 0 0 1 0  Moving slowly or fidgety/restless 0 0 0 0 0  Suicidal thoughts 0 0 0 1 0  PHQ-9 Score 4 1 2 9 1      Current Outpatient Medications  Medication Instructions   folic acid (FOLVITE) 1 mg, Oral, Daily   Heparin Sodium (Porcine) 5,000 Units, Injection, 2 times daily   insulin aspart (NOVOLOG) 2 Units, Subcutaneous, 3 times daily before meals   insulin glargine-yfgn (SEMGLEE (YFGN)) 30 Units, Subcutaneous, Daily   Insulin Pen Needle 31G X 5 MM MISC 1 Device, Does not apply, Daily   Insulin Syringe-Needle U-100 (GLOBAL INSULIN SYRINGES) 30G X 5/16" 0.3 ML MISC 1 Syringe, Does not apply, 3 times daily   iron polysaccharides (NIFEREX) 150 mg, Oral, Daily   metFORMIN (GLUCOPHAGE-XR) 1,000 mg,  Oral, 2 times daily   NIFEdipine (PROCARDIA XL) 30 mg, Oral, Daily   predniSONE (DELTASONE) 40 mg, Oral, Daily with breakfast   Prenatal MV & Min w/FA-DHA (PRENATAL GUMMIES PO) Oral     Review of Systems:   Pertinent items are noted in HPI Denies abnormal vaginal discharge w/ itching/odor/irritation, headaches, visual changes, shortness of breath, chest pain, abdominal pain, severe nausea/vomiting, or problems with urination or bowel movements unless otherwise stated above. Pertinent History Reviewed:  Reviewed past medical,surgical, social, obstetrical and family history.  Reviewed problem list, medications and allergies. Physical Assessment:   Vitals:   09/18/21 0912  Weight: 251 lb (113.9 kg)  Body mass index is 44.46 kg/m.           Physical Examination:   General appearance: alert, well appearing, and in no distress  Mental status: normal mood, behavior, speech, dress, motor activity, and thought processes  Skin: warm & dry   Extremities:      Cardiovascular: normal heart rate noted  Respiratory: normal respiratory effort, no distress  Abdomen: obese, soft and non-tender  Pelvic: Cervical exam deferred         Fetal Status: Fetal Heart Rate (bpm): 150 u/s   Movement: Present    Fetal Surveillance Testing today: doppler/bedside  Chaperone: N/A    No results found for this or any previous visit (from the past 24 hour(s)).   Assessment & Plan:  High-risk pregnancy: G7P0150 at [redacted]w[redacted]d with an Estimated Date of Delivery: 03/06/22   1) Class B DM -continue semglee 30units at night -adding 2u Novolog for meal coverage -return in 2 wks with logs to review  2) Chronic HTN -doing well with procardia  3) h/o recurrent loss, IUFD -continue heparin twice dialy  4) HSV2  Meds:  Meds ordered this encounter  Medications   insulin aspart (NOVOLOG) 100 UNIT/ML injection    Sig: Inject 2 Units into the skin 3 (three) times daily before meals.    Dispense:  10 mL     Refill:  2    PLEASE REVIEW AMOUNT TO INJECT WITH MEALS   Insulin Syringe-Needle U-100 (GLOBAL INSULIN SYRINGES) 30G X 5/16" 0.3 ML MISC    Sig: 1 Syringe by Does not apply route 3 (three) times daily.    Dispense:  100 each    Refill:  4    Labs/procedures today: none  Treatment Plan:  IT2 next visit, anatomy scan in 4 wks  Reviewed: Preterm labor symptoms and general obstetric precautions including but not limited to vaginal bleeding, contractions, leaking of fluid and fetal movement were reviewed in detail with the patient.  All questions were answered. Pt has home bp cuff. Check bp weekly, let us know if >140/90.   Follow-up: Return in about 2 weeks (around 10/02/2021) for HROB visit with IT-2 .   Future Appointments  Date Time Provider Department Center  10/02/2021 10:30 AM Myna Hidalgo, DO [facility]  10/16/2021  8:30 AM [facility] [name] [facility] None  10/16/2021  9:50 AM Lazaro Arms, MD [facility]    No orders of the defined types were placed in this encounter.   Myna Hidalgo, DO Attending Obstetrician & Gynecologist, [facility] for [employer], [facility]

## 2021-09-22 ENCOUNTER — Telehealth: Payer: Self-pay

## 2021-09-22 NOTE — Telephone Encounter (Signed)
Patient called and stated that the insulin she is taking is causing her sugar to drop really low, patient would like for a nurse to call her with advise.

## 2021-09-24 ENCOUNTER — Inpatient Hospital Stay (HOSPITAL_COMMUNITY)
Admission: AD | Admit: 2021-09-24 | Discharge: 2021-09-24 | Disposition: A | Discharge status: Home / Self Care | Payer: BLUE CROSS/BLUE SHIELD | Attending: Obstetrics & Gynecology | Admitting: Obstetrics & Gynecology

## 2021-09-24 ENCOUNTER — Encounter (HOSPITAL_COMMUNITY): Payer: Self-pay | Admitting: Obstetrics & Gynecology

## 2021-09-24 ENCOUNTER — Inpatient Hospital Stay (HOSPITAL_COMMUNITY): Payer: BLUE CROSS/BLUE SHIELD

## 2021-09-24 DIAGNOSIS — O24112 Pre-existing diabetes mellitus, type 2, in pregnancy, second trimester: Secondary | ICD-10-CM | POA: Diagnosis not present

## 2021-09-24 DIAGNOSIS — Z3A16 16 weeks gestation of pregnancy: Secondary | ICD-10-CM | POA: Insufficient documentation

## 2021-09-24 DIAGNOSIS — O10919 Unspecified pre-existing hypertension complicating pregnancy, unspecified trimester: Secondary | ICD-10-CM

## 2021-09-24 DIAGNOSIS — M25571 Pain in right ankle and joints of right foot: Secondary | ICD-10-CM | POA: Diagnosis present

## 2021-09-24 DIAGNOSIS — O10012 Pre-existing essential hypertension complicating pregnancy, second trimester: Secondary | ICD-10-CM | POA: Diagnosis not present

## 2021-09-24 DIAGNOSIS — S93401A Sprain of unspecified ligament of right ankle, initial encounter: Secondary | ICD-10-CM

## 2021-09-24 DIAGNOSIS — Z79899 Other long term (current) drug therapy: Secondary | ICD-10-CM | POA: Insufficient documentation

## 2021-09-24 DIAGNOSIS — O2622 Pregnancy care for patient with recurrent pregnancy loss, second trimester: Secondary | ICD-10-CM | POA: Diagnosis not present

## 2021-09-24 DIAGNOSIS — M25471 Effusion, right ankle: Secondary | ICD-10-CM | POA: Diagnosis not present

## 2021-09-24 DIAGNOSIS — Z7984 Long term (current) use of oral hypoglycemic drugs: Secondary | ICD-10-CM | POA: Insufficient documentation

## 2021-09-24 DIAGNOSIS — Z794 Long term (current) use of insulin: Secondary | ICD-10-CM | POA: Insufficient documentation

## 2021-09-24 HISTORY — DX: Essential (primary) hypertension: I10

## 2021-09-24 HISTORY — DX: Herpesviral infection, unspecified: B00.9

## 2021-09-24 MED ORDER — ACETAMINOPHEN 500 MG PO TABS
1000.0000 mg | ORAL_TABLET | Freq: Once | ORAL | Status: AC
  Administered 2021-09-24: 1000 mg via ORAL
  Filled 2021-09-24: qty 2

## 2021-09-24 NOTE — MAU Note (Addendum)
.  Nina Phillips is a 31 y.o. at [redacted]w[redacted]d here in MAU reporting soreness and swelling in right ankle. 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 Ankle was sore Tues night and worse yesterday. Throbbing now and foot and toes swollen. Ankle "pops" on top when walks. Iced it today while at work. Has hx gout but states this is a different pain. No pregnancy concerns  Onset of complaint: Tues Pain score: 10 Vitals:   09/24/21 1940 09/24/21 1941  BP:  (!) 158/79  Pulse: 98   Resp: 17   Temp: 97.7 F (36.5 C)   SpO2: 100%      FHT:161 Lab orders placed from triage: none

## 2021-09-24 NOTE — Progress Notes (Signed)
Orthopedic Tech Progress Note Patient Details:  Nina Phillips 03-Jan-1991 [id number]  ASO lace up ankle brace applied to RLE. To help reduce swelling I encouraged pt to elevate, ice, wear compression socks with the brace if possible and wiggle her toes as long as it does not cause further discomfort.    Ortho Devices Type of Ortho Device: ASO Ortho Device/Splint Location: RLE Ortho Device/Splint Interventions: Ordered, Application, Adjustment   Post Interventions Patient Tolerated: Well Instructions Provided: Care of device, Adjustment of device  Itai Barbian Carmine Savoy 09/24/2021, 9:55 PM

## 2021-09-24 NOTE — Discharge Instructions (Addendum)
Please call the clinic and request for a referral to physical therapy if no significant improvement in 2 weeks.  Come back if worsening pain after 2-3 days.  Please call the clinic or come in if Blood pressure is staying elevated despite rest and pain relief

## 2021-09-24 NOTE — MAU Note (Signed)
Dr Ladon Applebaum in [facility] to see pt and discuss plan of care.

## 2021-09-24 NOTE — MAU Provider Note (Signed)
History     [id number]  Arrival date and time: 09/24/21 1918    Chief Complaint  Patient presents with   Joint Swelling    HPI Nina Phillips is a 31 y.o. at [redacted]w[redacted]d by LMP with PMHx notable for recurrent pregnancy loss, T2DM, gout who presents for right ankle pain which began 2 days ago after a long day at work.  She works as Clinical biochemist in a Research officer, trade [employer].  Had been on her feet all day on Tuesday, after which she noticed onset of pain in her ankle by the end of the day. Pain has worsened since then and now unable to bear wait. Has a history of gout, usually involves her left ankle, with last flare about 1 month ago. Does not remember any recent falls, did not twist ankle. Has a history of of type 2 diabetes, treated with metformin and states that blood sugars are good.  Reviewed prenatal records.  Vaginal bleeding: No LOF: No Fetal Movement: Yes,beginning to feel quickening Contractions: No  O/Positive/-- (08/04 1118)  OB History     Gravida  7   Para  1   Term      Preterm  1   AB  5   Living  0      SAB  5   IAB      Ectopic      Multiple  0   Live Births              Past Medical History:  Diagnosis Date   Abnormal uterine bleeding (AUB) 07/09/2019   Anemia    Anxiety    Bicornate uterus    Depression    seeing a therapist since 3rd miscarriage, helping, doing good.   Diabetes mellitus without complication (HCC)    Type 2   Gout    History of miscarriage    HSV-2 infection    Hypertension    Ovarian cyst    Trichomonas infection 08/24/2018   Treated 08/17/18    Past Surgical History:  Procedure Laterality Date   DILATION AND CURETTAGE OF UTERUS     WISDOM TOOTH EXTRACTION      Family History  Problem Relation Age of Onset   Hypertension Mother    Gout Mother    Diabetes Father    Hypertension Father    Stroke Father    Hypertension Maternal Aunt    Cancer Maternal Grandmother        breast   Hypertension Maternal Grandfather     Heart attack Maternal Grandfather    Stroke Paternal Grandmother    Diabetes Paternal Grandfather    Cancer Other    Hypertension Other    Diabetes Other     Social History   Socioeconomic History   Marital status: Married    Spouse name: Not on file   Number of children: Not on file   Years of education: Not on file   Highest education level: Not on file  Occupational History   Not on file  Tobacco Use   Smoking status: Former   Smokeless tobacco: Never   Tobacco comments:    quit 2014  Vaping Use   Vaping Use: Never used  Substance and Sexual Activity   Alcohol use: No    Comment: occas   Drug use: Never   Sexual activity: Yes    Birth control/protection: None  Other Topics Concern   Not on file  Social History Narrative   Not on file  Social Determinants of Health   Financial Resource Strain: Low Risk  (08/28/2021)   Overall Financial Resource Strain (CARDIA)    Difficulty of Paying Living Expenses: Not hard at all  Food Insecurity: No Food Insecurity (08/28/2021)   Hunger Vital Sign    Worried About Running Out of Food in the Last Year: Never true    Ran Out of Food in the Last Year: Never true  Transportation Needs: No Transportation Needs (08/28/2021)   PRAPARE - Hydrologist (Medical): No    Lack of Transportation (Non-Medical): No  Physical Activity: Insufficiently Active (08/28/2021)   Exercise Vital Sign    Days of Exercise per Week: 4 days    Minutes of Exercise per Session: 30 min  Stress: No Stress Concern Present (08/28/2021)   [place]    Feeling of Stress : Only a little  Social Connections: Moderately Integrated (08/28/2021)   Social Connection and Isolation Panel [NHANES]    Frequency of Communication with Friends and Family: More than three times a week    Frequency of Social Gatherings with Friends and Family: Once a week    Attends Religious  Services: More than 4 times per year    Active Member of [employer] or Organizations: No    Attends Archivist Meetings: Never    Marital Status: Married  Human resources officer Violence: Not At Risk (08/28/2021)   Humiliation, Afraid, Rape, and Kick questionnaire    Fear of Current or Ex-Partner: No    Emotionally Abused: No    Physically Abused: No    Sexually Abused: No    Allergies  Allergen Reactions   Apple Fruit Extract Hives   Banana Hives   Other Hives    Walnuts    Shellfish Allergy Hives   Tomato Hives   Watermelon [Citrullus Vulgaris] Hives   Bactrim [Sulfamethoxazole-Trimethoprim] Hives and Rash   Sulfa Antibiotics Hives and Rash    No current facility-administered medications on file prior to encounter.   Current Outpatient Medications on File Prior to Encounter  Medication Sig Dispense Refill   folic acid (FOLVITE) 1 MG tablet Take 1 mg by mouth daily.     Heparin Sodium, Porcine, 5000 UNIT/0.5ML SOSY Inject 5,000 Units as directed 2 (two) times daily. 30 mL 6   insulin aspart (NOVOLOG) 100 UNIT/ML injection Inject 2 Units into the skin 3 (three) times daily before meals. 10 mL 2   insulin glargine-yfgn (SEMGLEE, YFGN,) 100 UNIT/ML injection Inject 0.3 mLs (30 Units total) into the skin daily. 30 mL 3   iron polysaccharides (NIFEREX) 150 MG capsule Take 1 capsule (150 mg total) by mouth daily. 30 capsule 6   metFORMIN (GLUCOPHAGE-XR) 500 MG 24 hr tablet Take 2 tablets (1,000 mg total) by mouth in the morning and at bedtime. 360 tablet 3   NIFEdipine (PROCARDIA XL) 30 MG 24 hr tablet Take 1 tablet (30 mg total) by mouth daily. 30 tablet 6   Prenatal MV & Min w/FA-DHA (PRENATAL GUMMIES PO) Take by mouth.     Insulin Pen Needle 31G X 5 MM MISC 1 Device by Does not apply route daily. 150 each 3   Insulin Syringe-Needle U-100 (GLOBAL INSULIN SYRINGES) 30G X 5/16" 0.3 ML MISC 1 Syringe by Does not apply route 3 (three) times daily. 100 each 4   [DISCONTINUED]  diphenhydrAMINE (BENADRYL) 25 mg capsule Take 25 mg by mouth every 6 (six) hours as needed for  allergies (and allergic reactions).        ROS Pertinent positives and negative per HPI, all others reviewed and negative  Physical Exam   BP (!) 157/97   Pulse 80   Temp 97.7 F (36.5 C)   Resp 17   Ht 5\' 3"  (1.6 m)   Wt 115.7 kg   LMP 05/30/2021   SpO2 100%   BMI 45.17 kg/m   Patient Vitals for the past 24 hrs:  BP Temp Pulse Resp SpO2 Height Weight  09/24/21 2254 (!) 157/97 -- 80 -- -- -- --  09/24/21 2253 (!) 157/97 -- -- -- -- -- --  09/24/21 2230 (!) 155/75 -- 73 -- -- -- --  09/24/21 1941 (!) 158/79 -- -- -- -- -- --  09/24/21 1940 -- 97.7 F (36.5 C) 98 17 100 % 5\' 3"  (1.6 m) 115.7 kg    Physical Exam Vitals reviewed.  Constitutional:      General: She is not in acute distress.    Appearance: She is well-developed. She is not toxic-appearing.  HENT:     Head: Normocephalic and atraumatic.     Mouth/Throat:     Mouth: Mucous membranes are moist.  Eyes:     Extraocular Movements: Extraocular movements intact.  Cardiovascular:     Rate and Rhythm: Normal rate.  Pulmonary:     Effort: Pulmonary effort is normal. No respiratory distress.  Abdominal:     Palpations: Abdomen is soft.  Musculoskeletal:     Right ankle: Swelling (++ swelling, tenderness to bilateral malleolar areas. pain with all ROM.) and deformity present. Tenderness present over the lateral malleolus and medial malleolus. Normal pulse.     Left ankle: Swelling (++swelling, no pain or redness) present. No tenderness. Normal range of motion. Normal pulse.  Skin:    General: Skin is warm and dry.  Neurological:     Mental Status: She is alert and oriented to person, place, and time.  Psychiatric:        Mood and Affect: Mood normal.        Behavior: Behavior normal.      Labs No results found for this or any previous visit (from the past 24 hour(s)).  Imaging DG Ankle Complete Right  Result  Date: 09/24/2021 CLINICAL DATA:  Right ankle pain EXAM: RIGHT ANKLE - COMPLETE 3+ VIEW COMPARISON:  None Available. FINDINGS: No fracture or malalignment. Ankle mortise is symmetric. Marked soft tissue swelling. IMPRESSION: Soft tissue swelling without acute osseous abnormality. Electronically Signed   By: Donavan Foil M.D.   On: 09/24/2021 21:25    MAU Course  Procedures Lab Orders  No laboratory test(s) ordered today   Meds ordered this encounter  Medications   acetaminophen (TYLENOL) tablet 1,000 mg   Imaging Orders         DG Ankle Complete Right     MDM mild  Assessment and Plan  31 yo G7P0150 at 84w5dt with worsening R ankle pain and swelling. Differentials include R ankle sprain, R ankle gout flare, fracture, septic arthritis. I suspect her symptoms are due to a sprain given onset after a particularly busy 12hr work day. She does have swelling in both ankles. No fever or other systemic signs, less likely gout flare, given absence of redness and warmth. Xrays show no signs of fractures or dislocations.  Ankle brace applied in MAU. - ankle feels better with brace in place. Recommend rest, ice, elevation and compression. Apply topical analgesia, modify activity to allow  for less weight bearing, start gentle ROM exercises.  Consider formal physical therapy if no significant improvement in 2 weeks.  Return for evaluation if worsening pain or swelling in the next 2 days despite these interventions.  BP elevated in MAU. Known cHTN on 30mg  procardia. Has not had dose this evening. No other symptoms. Home Blood pressures in the 120s-130s/70s per patient. - recommend taking medication as soon as discharged, monitor BP numbers, call clinic if still staying elevated.  #FWB - fetal heart tones normal on doppler  Dispo: discharged to home in stable condition   Discharge Instructions     Diet - low sodium heart healthy   Complete by: As directed    Increase activity slowly    Complete by: As directed        Allergies as of 09/24/2021       Reactions   Apple Fruit Extract Hives   Banana Hives   Other Hives   Walnuts   Shellfish Allergy Hives   Tomato Hives   Watermelon [citrullus Vulgaris] Hives   Bactrim [sulfamethoxazole-trimethoprim] Hives, Rash   Sulfa Antibiotics Hives, Rash        Medication List     STOP taking these medications    predniSONE 20 MG tablet Commonly known as: DELTASONE       TAKE these medications    folic acid 1 MG tablet Commonly known as: FOLVITE Take 1 mg by mouth daily.   Heparin Sodium (Porcine) 5000 UNIT/0.5ML Sosy Inject 5,000 Units as directed 2 (two) times daily.   insulin aspart 100 UNIT/ML injection Commonly known as: NovoLOG Inject 2 Units into the skin 3 (three) times daily before meals.   insulin glargine-yfgn 100 UNIT/ML injection Commonly known as: Semglee (yfgn) Inject 0.3 mLs (30 Units total) into the skin daily.   Insulin Pen Needle 31G X 5 MM Misc 1 Device by Does not apply route daily.   Insulin Syringe-Needle U-100 30G X 5/16" 0.3 ML Misc Commonly known as: Global Insulin Syringes 1 Syringe by Does not apply route 3 (three) times daily.   iron polysaccharides 150 MG capsule Commonly known as: NIFEREX Take 1 capsule (150 mg total) by mouth daily.   metFORMIN 500 MG 24 hr tablet Commonly known as: GLUCOPHAGE-XR Take 2 tablets (1,000 mg total) by mouth in the morning and at bedtime.   NIFEdipine 30 MG 24 hr tablet Commonly known as: Procardia XL Take 1 tablet (30 mg total) by mouth daily.   PRENATAL GUMMIES PO Take by mouth.       6/16 MD MPH OB Fellow, Faculty Practice [facility], [facility] 09/24/2021

## 2021-10-02 ENCOUNTER — Inpatient Hospital Stay (HOSPITAL_COMMUNITY)
Admission: AD | Admit: 2021-10-02 | Discharge: 2021-10-02 | Disposition: A | Discharge status: Home / Self Care | Payer: 59 | Attending: Obstetrics and Gynecology | Admitting: Obstetrics and Gynecology

## 2021-10-02 ENCOUNTER — Ambulatory Visit (INDEPENDENT_AMBULATORY_CARE_PROVIDER_SITE_OTHER): Payer: 59 | Admitting: Obstetrics & Gynecology

## 2021-10-02 ENCOUNTER — Encounter (HOSPITAL_COMMUNITY): Payer: Self-pay | Admitting: Obstetrics and Gynecology

## 2021-10-02 VITALS — BP 155/97 | HR 105 | Wt 246.0 lb

## 2021-10-02 DIAGNOSIS — O10919 Unspecified pre-existing hypertension complicating pregnancy, unspecified trimester: Secondary | ICD-10-CM

## 2021-10-02 DIAGNOSIS — Z3A17 17 weeks gestation of pregnancy: Secondary | ICD-10-CM | POA: Diagnosis not present

## 2021-10-02 DIAGNOSIS — O24112 Pre-existing diabetes mellitus, type 2, in pregnancy, second trimester: Secondary | ICD-10-CM | POA: Diagnosis not present

## 2021-10-02 DIAGNOSIS — O0992 Supervision of high risk pregnancy, unspecified, second trimester: Secondary | ICD-10-CM | POA: Diagnosis not present

## 2021-10-02 DIAGNOSIS — M25571 Pain in right ankle and joints of right foot: Secondary | ICD-10-CM | POA: Diagnosis not present

## 2021-10-02 DIAGNOSIS — Z1379 Encounter for other screening for genetic and chromosomal anomalies: Secondary | ICD-10-CM | POA: Diagnosis not present

## 2021-10-02 DIAGNOSIS — O112 Pre-existing hypertension with pre-eclampsia, second trimester: Secondary | ICD-10-CM | POA: Insufficient documentation

## 2021-10-02 DIAGNOSIS — O10912 Unspecified pre-existing hypertension complicating pregnancy, second trimester: Secondary | ICD-10-CM | POA: Insufficient documentation

## 2021-10-02 LAB — COMPREHENSIVE METABOLIC PANEL
ALT: 11 U/L (ref 0–44)
AST: 13 U/L — ABNORMAL LOW (ref 15–41)
Albumin: 2.7 g/dL — ABNORMAL LOW (ref 3.5–5.0)
Alkaline Phosphatase: 71 U/L (ref 38–126)
Anion gap: 9 (ref 5–15)
BUN: 6 mg/dL (ref 6–20)
CO2: 21 mmol/L — ABNORMAL LOW (ref 22–32)
Calcium: 9.6 mg/dL (ref 8.9–10.3)
Chloride: 106 mmol/L (ref 98–111)
Creatinine, Ser: 1.03 mg/dL — ABNORMAL HIGH (ref 0.44–1.00)
GFR, Estimated: >60 mL/min (ref >60)
Glucose, Bld: 125 mg/dL — ABNORMAL HIGH (ref 70–99)
Potassium: 3.9 mmol/L (ref 3.5–5.1)
Sodium: 136 mmol/L (ref 135–145)
Total Bilirubin: 0.1 mg/dL — ABNORMAL LOW (ref 0.3–1.2)
Total Protein: 8 g/dL (ref 6.5–8.1)

## 2021-10-02 LAB — CBC
HCT: 30.8 % — ABNORMAL LOW (ref 36.0–46.0)
Hemoglobin: 10.2 g/dL — ABNORMAL LOW (ref 12.0–15.0)
MCH: 24.8 pg — ABNORMAL LOW (ref 26.0–34.0)
MCHC: 33.1 g/dL (ref 30.0–36.0)
MCV: 74.8 fL — ABNORMAL LOW (ref 80.0–100.0)
Platelets: 530 10*3/uL — ABNORMAL HIGH (ref 150–400)
RBC: 4.12 MIL/uL (ref 3.87–5.11)
RDW: 18.5 % — ABNORMAL HIGH (ref 11.5–15.5)
WBC: 10.5 10*3/uL (ref 4.0–10.5)
nRBC: 0 % (ref 0.0–0.2)

## 2021-10-02 LAB — POCT URINALYSIS DIPSTICK OB
Glucose, UA: NEGATIVE
Ketones, UA: NEGATIVE
Leukocytes, UA: NEGATIVE
Nitrite, UA: NEGATIVE

## 2021-10-02 LAB — URINALYSIS, ROUTINE W REFLEX MICROSCOPIC
Bilirubin Urine: NEGATIVE
Glucose, UA: NEGATIVE mg/dL
Ketones, ur: 5 mg/dL — AB
Nitrite: NEGATIVE
Protein, ur: >=300 mg/dL — AB
Specific Gravity, Urine: 1.017 (ref 1.005–1.030)
pH: 6 (ref 5.0–8.0)

## 2021-10-02 LAB — PROTEIN / CREATININE RATIO, URINE
Creatinine, Urine: 242 mg/dL
Protein Creatinine Ratio: 1.26 mg/mg{Cre} — ABNORMAL HIGH (ref 0.00–0.15)
Total Protein, Urine: 306 mg/dL

## 2021-10-02 MED ORDER — CYCLOBENZAPRINE HCL 10 MG PO TABS: 10.0000 mg | ORAL_TABLET | Freq: Two times a day (BID) | ORAL | 0 refills | Status: DC | PRN

## 2021-10-02 MED ORDER — CYCLOBENZAPRINE HCL 5 MG PO TABS
10.0000 mg | ORAL_TABLET | Freq: Once | ORAL | Status: AC
  Administered 2021-10-02: 10 mg via ORAL
  Filled 2021-10-02: qty 2

## 2021-10-02 NOTE — MAU Provider Note (Signed)
History     CSN: [id number]  Arrival date and time: 10/02/21 1204   Event Date/Time   First Provider Initiated Contact with Patient 10/02/21 1244      Chief Complaint  Patient presents with   Hypertension   HPI  Nina Phillips is a 31 y.o. G7P0150 at [redacted]w[redacted]d who presents for evaluation of elevated blood pressures. Dr. Charlotta Newton saw patient in office this am and felt that her swelling was more than expected and BPs are higher so she wanted her to come be seen for preeclampsia labs in MAU.   Patient reports feeling well. Denies any HA, visual changes or epigastric pain. She reports her swelling is consistent with her baseline and the compression stockings help. She reports pain in her ankle that is ongoing and feels this is the reason her BP was elevated today. Patient rates the pain as a 8/10 and has not tried anything for the pain. She denies any vaginal bleeding, discharge, and leaking of fluid. Denies any constipation, diarrhea or any urinary complaints. Reports normal fetal movement.   OB History     Gravida  7   Para  1   Term      Preterm  1   AB  5   Living  0      SAB  5   IAB      Ectopic      Multiple  0   Live Births              Past Medical History:  Diagnosis Date   Abnormal uterine bleeding (AUB) 07/09/2019   Anemia    Anxiety    Bicornate uterus    Depression    seeing a therapist since 3rd miscarriage, helping, doing good.   Diabetes mellitus without complication (HCC)    Type 2   Gout    History of miscarriage    HSV-2 infection    Hypertension    Ovarian cyst    Trichomonas infection 08/24/2018   Treated 08/17/18    Past Surgical History:  Procedure Laterality Date   DILATION AND CURETTAGE OF UTERUS     WISDOM TOOTH EXTRACTION      Family History  Problem Relation Age of Onset   Hypertension Mother    Gout Mother    Diabetes Father    Hypertension Father    Stroke Father    Hypertension Maternal Aunt    Cancer Maternal  Grandmother        breast   Hypertension Maternal Grandfather    Heart attack Maternal Grandfather    Stroke Paternal Grandmother    Diabetes Paternal Grandfather    Cancer Other    Hypertension Other    Diabetes Other     Social History   Tobacco Use   Smoking status: Former   Smokeless tobacco: Never   Tobacco comments:    quit 2014  Vaping Use   Vaping Use: Never used  Substance Use Topics   Alcohol use: No    Comment: occas   Drug use: Never    Allergies:  Allergies  Allergen Reactions   Apple Fruit Extract Hives   Banana Hives   Other Hives    Walnuts    Shellfish Allergy Hives   Tomato Hives   Watermelon [Citrullus Vulgaris] Hives   Bactrim [Sulfamethoxazole-Trimethoprim] Hives and Rash   Sulfa Antibiotics Hives and Rash    No medications prior to admission.    Review of Systems  Constitutional: Negative.  Negative for fatigue and fever.  HENT: Negative.    Respiratory: Negative.  Negative for shortness of breath.   Cardiovascular: Negative.  Negative for chest pain.  Gastrointestinal: Negative.  Negative for abdominal pain, constipation, diarrhea, nausea and vomiting.  Genitourinary: Negative.  Negative for dysuria.  Musculoskeletal:        Ankle pain  Neurological: Negative.  Negative for dizziness and headaches.   Physical Exam   Blood pressure 124/83, pulse 81, temperature 98.5 F (36.9 C), temperature source Oral, resp. rate 20, height 5\' 3"  (1.6 m), weight 112 kg, last menstrual period 05/30/2021, SpO2 100 %.  Patient Vitals for the past 24 hrs:  BP Temp Temp src Pulse Resp SpO2 Height Weight  10/02/21 1430 124/83 -- -- 81 -- -- -- --  10/02/21 1415 131/83 -- -- 88 -- -- -- --  10/02/21 1400 125/84 -- -- 88 -- -- -- --  10/02/21 1345 138/87 -- -- 93 -- -- -- --  10/02/21 1330 (!) 145/83 -- -- 95 -- -- -- --  10/02/21 1315 136/81 -- -- 88 -- -- -- --  10/02/21 1305 -- -- -- -- -- 100 % -- --  10/02/21 1300 (!) 122/96 -- -- 87 -- -- -- --   10/02/21 1245 (!) 144/82 -- -- 90 -- 98 % -- --  10/02/21 1230 134/63 -- -- 93 -- -- -- --  10/02/21 1222 (!) 143/78 98.5 F (36.9 C) Oral 92 20 100 % 5\' 3"  (1.6 m) 112 kg    Physical Exam Vitals and nursing note reviewed.  Constitutional:      General: She is not in acute distress.    Appearance: She is well-developed.  HENT:     Head: Normocephalic.  Eyes:     Pupils: Pupils are equal, round, and reactive to light.  Cardiovascular:     Rate and Rhythm: Normal rate and regular rhythm.     Heart sounds: Normal heart sounds.  Pulmonary:     Effort: Pulmonary effort is normal. No respiratory distress.     Breath sounds: Normal breath sounds.  Abdominal:     General: Bowel sounds are normal. There is no distension.     Palpations: Abdomen is soft.     Tenderness: There is no abdominal tenderness.  Skin:    General: Skin is warm and dry.  Neurological:     Mental Status: She is alert and oriented to person, place, and time.     Motor: No abnormal muscle tone.     Coordination: Coordination normal.     Deep Tendon Reflexes: Reflexes are normal and symmetric. Reflexes normal.  Psychiatric:        Mood and Affect: Mood normal.        Behavior: Behavior normal.        Thought Content: Thought content normal.        Judgment: Judgment normal.     FHT: 141 bpm  MAU Course  Procedures  Results for orders placed or performed during the hospital encounter of 10/02/21 (from the past 24 hour(s))  CBC     Status: Abnormal   Collection Time: 10/02/21 12:16 PM  Result Value Ref Range   WBC 10.5 4.0 - 10.5 K/uL   RBC 4.12 3.87 - 5.11 MIL/uL   Hemoglobin 10.2 (L) 12.0 - 15.0 g/dL   HCT 30.8 (L) 36.0 - 46.0 %   MCV 74.8 (L) 80.0 - 100.0 fL   MCH 24.8 (L) 26.0 - 34.0 pg  MCHC 33.1 30.0 - 36.0 g/dL   RDW 63.0 (H) 16.0 - 10.9 %   Platelets 530 (H) 150 - 400 K/uL   nRBC 0.0 0.0 - 0.2 %  Comprehensive metabolic panel     Status: Abnormal   Collection Time: 10/02/21 12:16 PM   Result Value Ref Range   Sodium 136 135 - 145 mmol/L   Potassium 3.9 3.5 - 5.1 mmol/L   Chloride 106 98 - 111 mmol/L   CO2 21 (L) 22 - 32 mmol/L   Glucose, Bld 125 (H) 70 - 99 mg/dL   BUN 6 6 - 20 mg/dL   Creatinine, Ser 3.23 (H) 0.44 - 1.00 mg/dL   Calcium 9.6 8.9 - 55.7 mg/dL   Total Protein 8.0 6.5 - 8.1 g/dL   Albumin 2.7 (L) 3.5 - 5.0 g/dL   AST 13 (L) 15 - 41 U/L   ALT 11 0 - 44 U/L   Alkaline Phosphatase 71 38 - 126 U/L   Total Bilirubin 0.1 (L) 0.3 - 1.2 mg/dL   GFR, Estimated >32 >20 mL/min   Anion gap 9 5 - 15  Urinalysis, Routine w reflex microscopic     Status: Abnormal   Collection Time: 10/02/21 12:45 PM  Result Value Ref Range   Color, Urine YELLOW YELLOW   APPearance CLOUDY (A) CLEAR   Specific Gravity, Urine 1.017 1.005 - 1.030   pH 6.0 5.0 - 8.0   Glucose, UA NEGATIVE NEGATIVE mg/dL   Hgb urine dipstick MODERATE (A) NEGATIVE   Bilirubin Urine NEGATIVE NEGATIVE   Ketones, ur 5 (A) NEGATIVE mg/dL   Protein, ur >=254 (A) NEGATIVE mg/dL   Nitrite NEGATIVE NEGATIVE   Leukocytes,Ua TRACE (A) NEGATIVE   RBC / HPF 0-5 0 - 5 RBC/hpf   WBC, UA 0-5 0 - 5 WBC/hpf   Bacteria, UA RARE (A) NONE SEEN   Squamous Epithelial / LPF 21-50 0 - 5   Mucus PRESENT    Hyaline Casts, UA PRESENT    Amorphous Crystal PRESENT   Protein / creatinine ratio, urine     Status: Abnormal   Collection Time: 10/02/21 12:45 PM  Result Value Ref Range   Creatinine, Urine 242 mg/dL   Total Protein, Urine 306 mg/dL   Protein Creatinine Ratio 1.26 (H) 0.00 - 0.15 mg/mg[Cre]    MDM Labs ordered and reviewed.   UA CBC, CMP, Protein/creat ratio Flexeril- pain now a 2/10  CNM consulted with Dr. Jolayne Panther regarding elevated creat but within baseline from review of previous labs- MD recommends follow up outpatient as planned  Assessment and Plan   1. Chronic hypertension affecting pregnancy   2. [redacted] weeks gestation of pregnancy   3. Acute right ankle pain     -Discharge home in stable  condition -Hypertension precautions discussed -Patient advised to follow-up with OB as scheduled for prenatal care -Patient may return to MAU as needed or if her condition were to change or worsen  Rolm Bookbinder, CNM 10/02/2021, 12:45 PM

## 2021-10-02 NOTE — Discharge Instructions (Signed)

## 2021-10-02 NOTE — Progress Notes (Signed)
HIGH-RISK PREGNANCY VISIT Patient name: Nina Phillips MRN [id number]  Date of birth: Nov 15, 1990 Chief Complaint:   Routine Prenatal Visit  History of Present Illness:   Nina Phillips is a 31 y.o. G45P0150 female at [redacted]w[redacted]d with an Estimated Date of Delivery: 03/06/22 being seen today for ongoing management of a high-risk pregnancy complicated by:  -chronic HTN- on procardia XL 30mg  daily -T2DM- on metformin and semglee 30 at nigth -pt has monitor- sugars mostly within normal range though it looks like she is checking <50% of the time- typically 1-2 per day -h/o RPL- on heparin 5000u bid -HSV2   She overall states she is doing about the same. She has struggled with leg pain and swelling in both legs. Currently requiring a cane for assistance.  States that at work she is on her feet for 12+ hours  Denies headache, blurry vision or RUQ pain.  Contractions: Not present. Vag. Bleeding: None.  Movement: Present. denies leaking of fluid.      08/28/2021    9:27 AM 03/20/2021   12:19 PM 12/13/2019    2:52 PM 11/28/2019    2:27 PM 10/10/2019   11:06 AM  Depression screen PHQ 2/9  Decreased Interest 1 0 0 1 0  Down, Depressed, Hopeless 0 0 0 1 0  PHQ - 2 Score 1 0 0 2 0  Altered sleeping 1 0 1 1 1   Tired, decreased energy 1 1 0 0 0  Change in appetite 1 0 0 1 0  Feeling bad or failure about yourself  0 0 1 3 0  Trouble concentrating 0 0 0 1 0  Moving slowly or fidgety/restless 0 0 0 0 0  Suicidal thoughts 0 0 0 1 0  PHQ-9 Score 4 1 2 9 1      Current Outpatient Medications  Medication Instructions   folic acid (FOLVITE) 1 mg, Oral, Daily   Heparin Sodium (Porcine) 5,000 Units, Injection, 2 times daily   insulin aspart (NOVOLOG) 2 Units, Subcutaneous, 3 times daily before meals   insulin glargine-yfgn (SEMGLEE (YFGN)) 30 Units, Subcutaneous, Daily   Insulin Pen Needle 31G X 5 MM MISC 1 Device, Does not apply, Daily   Insulin Syringe-Needle U-100 (GLOBAL INSULIN SYRINGES) 30G X 5/16"  0.3 ML MISC 1 Syringe, Does not apply, 3 times daily   iron polysaccharides (NIFEREX) 150 mg, Oral, Daily   metFORMIN (GLUCOPHAGE-XR) 1,000 mg, Oral, 2 times daily   NIFEdipine (PROCARDIA XL) 30 mg, Oral, Daily   Prenatal MV & Min w/FA-DHA (PRENATAL GUMMIES PO) Oral     Review of Systems:   Pertinent items are noted in HPI Denies abnormal vaginal discharge w/ itching/odor/irritation, shortness of breath, chest pain, abdominal pain, severe nausea/vomiting, or problems with urination or bowel movements unless otherwise stated above. Pertinent History Reviewed:  Reviewed past medical,surgical, social, obstetrical and family history.  Reviewed problem list, medications and allergies. Physical Assessment:   Vitals:   10/02/21 1042 10/02/21 1048  BP: (!) 145/95 (!) 155/97  Pulse: (!) 109 (!) 105  Weight: 246 lb (111.6 kg)   Body mass index is 43.58 kg/m.           Physical Examination:   General appearance: appears fatigued  Mental status: normal mood, behavior and thought process  Skin: warm & dry   Extremities: Edema: None    Cardiovascular: normal heart rate noted  Respiratory: normal respiratory effort, no distress  Abdomen: gravid, soft, non-tender  Pelvic: Cervical exam deferred  Fetal Status:     Movement: Present    Chaperone: N/A    Results for orders placed or performed in visit on 10/02/21 (from the past 24 hour(s))  POC Urinalysis Dipstick OB   Collection Time: 10/02/21 10:48 AM  Result Value Ref Range   Color, UA     Clarity, UA     Glucose, UA Negative Negative   Bilirubin, UA     Ketones, UA neg    Spec Grav, UA     Blood, UA mod    pH, UA     POC,PROTEIN,UA Large (3+) Negative, Trace, Small (1+), Moderate (2+), Large (3+), 4+   Urobilinogen, UA     Nitrite, UA neg    Leukocytes, UA Negative Negative   Appearance     Odor       Assessment & Plan:  High-risk pregnancy: G7P0150 at [redacted]w[redacted]d with an Estimated Date of Delivery: 03/06/22   1) Chronic  HTN with baseline proteinuria -per pt BPs at home 120/70s -currently asymptomatic -due to increased BP and 3+ protein, recommended pt to go to MAU for further monitoring and testing -discussed with pt that it will be difficult to assess if symptoms of pregnancy and chronic HTN vs superimposed preeclampsia   2) T2DM -no change in medication -currently followed by endocrinology  3) RPL -continue heparin  []  scheduled for MFM detailed anatomy scan  Meds: No orders of the defined types were placed in this encounter.   Labs/procedures today: IT-2  Treatment Plan:  pt sent to MAU, if BP improves and labs stable, next visit with would be in 4 wks  Reviewed: Preterm labor symptoms and general obstetric precautions including but not limited to vaginal bleeding, contractions, leaking of fluid and fetal movement were reviewed in detail with the patient.  All questions were answered. Pt has home bp cuff. Check bp weekly, let us know if >140/90.   Follow-up: No follow-ups on file.   Future Appointments  Date Time Provider Department Center  10/16/2021  8:30 AM [facility] - [facility] None  10/16/2021  9:50 AM Eure, 10/18/2021, MD [facility]    Orders Placed This Encounter  Procedures   INTEGRATED 2   POC Urinalysis Dipstick OB    Amaryllis Dyke, DO Attending Obstetrician & Gynecologist, [facility] for [facility]

## 2021-10-02 NOTE — MAU Note (Signed)
.  Nina Phillips is a 31 y.o. at [redacted]w[redacted]d here in MAU reporting: she was sent over from the office due to her BP being high. Patient says that she told her OB that her BP has been normal at home and is only elevated when she has severe pain. She reports that her right ankle has been throbbing and aching (9/10) for the past couple of days and this is why her BP was up today. Denies VB or LOF. Denies HA, visual changes, RUQ/epigastric pain, or abnormal swelling.   Onset of complaint: today Pain score: 9/10 Vitals:   10/02/21 1222  BP: (!) 143/78  Pulse: 92  Resp: 20  Temp: 98.5 F (36.9 C)  SpO2: 100%     FHT:120  Lab orders placed from triage:  UA

## 2021-10-05 ENCOUNTER — Telehealth: Payer: Self-pay

## 2021-10-05 ENCOUNTER — Encounter: Payer: Self-pay | Admitting: *Deleted

## 2021-10-05 LAB — INTEGRATED 2
AFP MoM: 3.3
Alpha-Fetoprotein: 85.9 ng/mL
Crown Rump Length: 53.3 mm
DIA MoM: 1.06
DIA Value: 120.8 pg/mL
Estriol, Unconjugated: 0.17 ng/mL
Gest. Age on Collection Date: 11.9 weeks
Gestational Age: 16.9 weeks
Maternal Age at EDD: 29.7 yr
Nuchal Translucency (NT): 1.2 mm
Nuchal Translucency MoM: 1.01
Number of Fetuses: 1
PAPP-A MoM: 0.7
PAPP-A Value: 290.8 ng/mL
Test Results:: POSITIVE — AB
Weight: 250 [lb_av]
Weight: 250 [lb_av]
hCG MoM: 0.24
hCG Value: 5.1 IU/mL
uE3 MoM: 0.18

## 2021-10-05 NOTE — Telephone Encounter (Signed)
Patient would like to know is she can get something called in for constipation.

## 2021-10-06 ENCOUNTER — Encounter: Payer: Self-pay | Admitting: Women's Health

## 2021-10-06 ENCOUNTER — Other Ambulatory Visit: Payer: Self-pay | Admitting: Women's Health

## 2021-10-06 ENCOUNTER — Ambulatory Visit (INDEPENDENT_AMBULATORY_CARE_PROVIDER_SITE_OTHER): Payer: 59

## 2021-10-06 ENCOUNTER — Ambulatory Visit (INDEPENDENT_AMBULATORY_CARE_PROVIDER_SITE_OTHER): Payer: 59 | Admitting: Women's Health

## 2021-10-06 ENCOUNTER — Encounter: Payer: Self-pay | Admitting: Advanced Practice Midwife

## 2021-10-06 VITALS — BP 137/91 | HR 101 | Wt 251.0 lb

## 2021-10-06 DIAGNOSIS — O24112 Pre-existing diabetes mellitus, type 2, in pregnancy, second trimester: Secondary | ICD-10-CM

## 2021-10-06 DIAGNOSIS — Z3A18 18 weeks gestation of pregnancy: Secondary | ICD-10-CM | POA: Diagnosis not present

## 2021-10-06 DIAGNOSIS — O0990 Supervision of high risk pregnancy, unspecified, unspecified trimester: Secondary | ICD-10-CM | POA: Diagnosis not present

## 2021-10-06 DIAGNOSIS — O021 Missed abortion: Secondary | ICD-10-CM | POA: Diagnosis not present

## 2021-10-06 DIAGNOSIS — E162 Hypoglycemia, unspecified: Secondary | ICD-10-CM | POA: Diagnosis not present

## 2021-10-06 DIAGNOSIS — O3680X0 Pregnancy with inconclusive fetal viability, not applicable or unspecified: Secondary | ICD-10-CM | POA: Diagnosis not present

## 2021-10-06 DIAGNOSIS — O10919 Unspecified pre-existing hypertension complicating pregnancy, unspecified trimester: Secondary | ICD-10-CM

## 2021-10-06 DIAGNOSIS — N96 Recurrent pregnancy loss: Secondary | ICD-10-CM | POA: Diagnosis not present

## 2021-10-06 LAB — POCT URINALYSIS DIPSTICK OB
Glucose, UA: NEGATIVE
Ketones, UA: NEGATIVE
Leukocytes, UA: NEGATIVE
Nitrite, UA: NEGATIVE

## 2021-10-06 LAB — GLUCOSE, POCT (MANUAL RESULT ENTRY): POC Glucose: 42 mg/dl — AB (ref 70–99)

## 2021-10-06 NOTE — Patient Instructions (Signed)
Nina Phillips, thank you for choosing our office today! We appreciate the opportunity to meet your healthcare needs. You may receive a short survey by mail, e-mail, or through [employer]. If you are happy with your care we would appreciate if you could take just a few minutes to complete the survey questions. We read all of your comments and take your feedback very seriously. Thank you again for choosing our office.  Center for [employer] Team at [facility] at [facility] ([address] [place], [place] [address]) Entrance C, located off of [address] Free 24/7 valet parking  Go to [email or web address] to register for FREE online childbirth classes  Call the office [phone]) or go to [facility] if: You begin to severe cramping Your water breaks.  Sometimes it is a big gush of fluid, sometimes it is just a trickle that keeps getting your panties wet or running down your legs You have vaginal bleeding.  It is normal to have a small amount of spotting if your cervix was checked.   [facility] Pediatricians/Family Doctors [facility]): [address] Colette Ribas, [phone]           [facility]: [address], [phone]                [facility] Medicine [facility]): [address], [phone] (call to ask if accepting patients) [facility] Department: [address], [place], [phone]    [facility] Pediatricians/Family Doctors [facility]): [phone] [address], [phone] [facility]: [address] [place], [phone] [facility] [place]: [address], [phone]  [facility] Doctors  [facility]): [phone] [facility]: [address], [phone]   [facility] Doctors [facility]: [address] [phone]  [facility] Doctors  [facility]: [phone], [phone]  Home Blood Pressure Monitoring for Patients   Your provider has recommended that you check your blood pressure (BP) at least once a week at home. If you do not have a blood pressure cuff at home, one will be provided for you. Contact your provider if you have not received your monitor within 1 week.   Helpful Tips for Accurate Home Blood Pressure Checks  Don't smoke, exercise, or drink caffeine 30 minutes before checking your BP Use the restroom before checking your BP (a full bladder can raise your pressure) Relax in a comfortable upright chair Feet on the ground Left arm resting comfortably on a flat surface at the level of your heart Legs uncrossed Back supported Sit quietly and don't talk Place the cuff on your bare arm Adjust snuggly, so that only two fingertips can fit between your skin and the top of the cuff Check 2 readings separated by at least one minute Keep a log of your BP readings For a visual, please reference this diagram: http://ccnc.care/bpdiagram  Provider Name: [facility]     Phone: [phone]  Zone 1: ALL CLEAR  Continue to monitor your symptoms:  BP reading is less than 140 (top number) or less than 90 (bottom number)  No right upper stomach pain No headaches or seeing spots No feeling nauseated or throwing up No swelling in face and hands  Zone 2: CAUTION Call your doctor's office for any of the following:  BP reading is greater than 140 (top number) or greater than  90 (bottom number)  Stomach pain under your ribs in the middle or right side Headaches or seeing spots Feeling nauseated or throwing up Swelling in face and hands  Zone 3: EMERGENCY  Seek immediate medical care if you have any of the following:  BP reading is greater than160 (top number) or greater than 110 (bottom number) Severe headaches not improving with Tylenol Serious difficulty catching your breath Any worsening symptoms from  Zone 2     Second Trimester of Pregnancy The second trimester is from week 14 through week 27 (months 4 through 6). The second trimester is often a time when you feel your best. Your body has adjusted to being pregnant, and you begin to feel better physically. Usually, morning sickness has lessened or quit completely, you may have more energy, and you may have an increase in appetite. The second trimester is also a time when the fetus is growing rapidly. At the end of the sixth month, the fetus is about 9 inches long and weighs about 1 pounds. You will likely begin to feel the baby move (quickening) between 16 and 20 weeks of pregnancy. Body changes during your second trimester Your body continues to go through many changes during your second trimester. The changes vary from woman to woman. Your weight will continue to increase. You will notice your lower abdomen bulging out. You may begin to get stretch marks on your hips, abdomen, and breasts. You may develop headaches that can be relieved by medicines. The medicines should be approved by your health care provider. You may urinate more often because the fetus is pressing on your bladder. You may develop or continue to have heartburn as a result of your pregnancy. You may develop constipation because certain hormones are causing the muscles that push waste through your intestines to slow down. You may develop hemorrhoids or swollen, bulging veins (varicose veins). You may have back pain. This is caused by: Weight gain. Pregnancy hormones that are relaxing the joints in your pelvis. A shift in weight and the muscles that support your balance. Your breasts will continue to grow and they will continue to become tender. Your gums may bleed and may be sensitive to brushing and flossing. Dark spots or blotches (chloasma, mask of pregnancy) may develop on your face. This will likely fade after the baby is born. A dark line from your belly button to  the pubic area (linea nigra) may appear. This will likely fade after the baby is born. You may have changes in your hair. These can include thickening of your hair, rapid growth, and changes in texture. Some women also have hair loss during or after pregnancy, or hair that feels dry or thin. Your hair will most likely return to normal after your baby is born.  What to expect at prenatal visits During a routine prenatal visit: You will be weighed to make sure you and the fetus are growing normally. Your blood pressure will be taken. Your abdomen will be measured to track your baby's growth. The fetal heartbeat will be listened to. Any test results from the previous visit will be discussed.  Your health care provider may ask you: How you are feeling. If you are feeling the baby move. If you have had any abnormal symptoms, such as leaking fluid, bleeding, severe headaches, or abdominal cramping. If you are using any tobacco products, including cigarettes, chewing tobacco, and electronic cigarettes. If you have any questions.  Other tests that may be performed during   your second trimester include: Blood tests that check for: Low iron levels (anemia). High blood sugar that affects pregnant women (gestational diabetes) between 24 and 28 weeks. Rh antibodies. This is to check for a protein on red blood cells (Rh factor). Urine tests to check for infections, diabetes, or protein in the urine. An ultrasound to confirm the proper growth and development of the baby. An amniocentesis to check for possible genetic problems. Fetal screens for spina bifida and Down syndrome. HIV (human immunodeficiency virus) testing. Routine prenatal testing includes screening for HIV, unless you choose not to have this test.  Follow these instructions at home: Medicines Follow your health care provider's instructions regarding medicine use. Specific medicines may be either safe or unsafe to take during  pregnancy. Take a prenatal vitamin that contains at least 600 micrograms (mcg) of folic acid. If you develop constipation, try taking a stool softener if your health care provider approves. Eating and drinking Eat a balanced diet that includes fresh fruits and vegetables, whole grains, good sources of protein such as meat, eggs, or tofu, and low-fat dairy. Your health care provider will help you determine the amount of weight gain that is right for you. Avoid raw meat and uncooked cheese. These carry germs that can cause birth defects in the baby. If you have low calcium intake from food, talk to your health care provider about whether you should take a daily calcium supplement. Limit foods that are high in fat and processed sugars, such as fried and sweet foods. To prevent constipation: Drink enough fluid to keep your urine clear or pale yellow. Eat foods that are high in fiber, such as fresh fruits and vegetables, whole grains, and beans. Activity Exercise only as directed by your health care provider. Most women can continue their usual exercise routine during pregnancy. Try to exercise for 30 minutes at least 5 days a week. Stop exercising if you experience uterine contractions. Avoid heavy lifting, wear low heel shoes, and practice good posture. A sexual relationship may be continued unless your health care provider directs you otherwise. Relieving pain and discomfort Wear a good support bra to prevent discomfort from breast tenderness. Take warm sitz baths to soothe any pain or discomfort caused by hemorrhoids. Use hemorrhoid cream if your health care provider approves. Rest with your legs elevated if you have leg cramps or low back pain. If you develop varicose veins, wear support hose. Elevate your feet for 15 minutes, 3-4 times a day. Limit salt in your diet. Prenatal Care Write down your questions. Take them to your prenatal visits. Keep all your prenatal visits as told by your health  care provider. This is important. Safety Wear your seat belt at all times when driving. Make a list of emergency phone numbers, including numbers for family, friends, the hospital, and police and fire departments. General instructions Ask your health care provider for a referral to a local prenatal education class. Begin classes no later than the beginning of month 6 of your pregnancy. Ask for help if you have counseling or nutritional needs during pregnancy. Your health care provider can offer advice or refer you to specialists for help with various needs. Do not use hot tubs, steam rooms, or saunas. Do not douche or use tampons or scented sanitary pads. Do not cross your legs for long periods of time. Avoid cat litter boxes and soil used by cats. These carry germs that can cause birth defects in the baby and possibly loss of the   fetus by miscarriage or stillbirth. Avoid all smoking, herbs, alcohol, and unprescribed drugs. Chemicals in these products can affect the formation and growth of the baby. Do not use any products that contain nicotine or tobacco, such as cigarettes and e-cigarettes. If you need help quitting, ask your health care provider. Visit your dentist if you have not gone yet during your pregnancy. Use a soft toothbrush to brush your teeth and be gentle when you floss. Contact a health care provider if: You have dizziness. You have mild pelvic cramps, pelvic pressure, or nagging pain in the abdominal area. You have persistent nausea, vomiting, or diarrhea. You have a bad smelling vaginal discharge. You have pain when you urinate. Get help right away if: You have a fever. You are leaking fluid from your vagina. You have spotting or bleeding from your vagina. You have severe abdominal cramping or pain. You have rapid weight gain or weight loss. You have shortness of breath with chest pain. You notice sudden or extreme swelling of your face, hands, ankles, feet, or legs. You  have not felt your baby move in over an hour. You have severe headaches that do not go away when you take medicine. You have vision changes. Summary The second trimester is from week 14 through week 27 (months 4 through 6). It is also a time when the fetus is growing rapidly. Your body goes through many changes during pregnancy. The changes vary from woman to woman. Avoid all smoking, herbs, alcohol, and unprescribed drugs. These chemicals affect the formation and growth your baby. Do not use any tobacco products, such as cigarettes, chewing tobacco, and e-cigarettes. If you need help quitting, ask your health care provider. Contact your health care provider if you have any questions. Keep all prenatal visits as told by your health care provider. This is important. This information is not intended to replace advice given to you by your health care provider. Make sure you discuss any questions you have with your health care provider. Document Released: 01/05/2001 Document Revised: 06/19/2015 Document Reviewed: 03/14/2012 Elsevier Interactive Patient Education  2017 [employer]  

## 2021-10-06 NOTE — Progress Notes (Addendum)
Work-in HIGH-RISK PREGNANCY VISIT Patient name: Nina Phillips MRN [id number]  Date of birth: Dec 23, 1990 Chief Complaint:   Routine Prenatal Visit (W/I for bleeding)  History of Present Illness:   Nina Phillips is a 31 y.o. G64P0150 female at [redacted]w[redacted]d with an Estimated Date of Delivery: 03/06/22 being seen today for ongoing management of a high-risk pregnancy complicated by chronic hypertension currently on nifedipine 30mg  daily, diabetes mellitus, T2DM currently on metformin 1000mg  BID, novolog 2u TID and semglee 30y daily and h/o SAB x 5 & 21wk IUFD on heparin 5000u BID.   Today she reports  pink to bright red vaginal bleeding that started around 1000 at work. Denies pain, cramping . Denies abnormal discharge, itching/odor/irritation.  No recent sex. Last felt baby move yesterday. Contractions: Not present. Vag. Bleeding: Small.  Movement: Present. denies leaking of fluid.      08/28/2021    9:27 AM 03/20/2021   12:19 PM 12/13/2019    2:52 PM 11/28/2019    2:27 PM 10/10/2019   11:06 AM  Depression screen PHQ 2/9  Decreased Interest 1 0 0 1 0  Down, Depressed, Hopeless 0 0 0 1 0  PHQ - 2 Score 1 0 0 2 0  Altered sleeping 1 0 1 1 1   Tired, decreased energy 1 1 0 0 0  Change in appetite 1 0 0 1 0  Feeling bad or failure about yourself  0 0 1 3 0  Trouble concentrating 0 0 0 1 0  Moving slowly or fidgety/restless 0 0 0 0 0  Suicidal thoughts 0 0 0 1 0  PHQ-9 Score 4 1 2 9 1         08/28/2021    9:28 AM 03/20/2021   12:20 PM 12/13/2019    2:51 PM 11/28/2019    2:31 PM  GAD 7 : Generalized Anxiety Score  Nervous, Anxious, on Edge 1 1 0 2  Control/stop worrying 1 1 1 3   Worry too much - different things 1 1 1 3   Trouble relaxing 0 0 0 3  Restless 0 0 0 1  Easily annoyed or irritable 1 1 1 1   Afraid - awful might happen 1 1 0 2  Total GAD 7 Score 5 5 3 15      Review of Systems:   Pertinent items are noted in HPI Denies abnormal vaginal discharge w/ itching/odor/irritation,  headaches, visual changes, shortness of breath, chest pain, abdominal pain, severe nausea/vomiting, or problems with urination or bowel movements unless otherwise stated above. Pertinent History Reviewed:  Reviewed past medical,surgical, social, obstetrical and family history.  Reviewed problem list, medications and allergies. Physical Assessment:   Vitals:   10/06/21 1339 10/06/21 1358  BP: (!) 141/83 (!) 137/91  Pulse: (!) 102 (!) 101  Weight: 251 lb (113.9 kg)   Body mass index is 44.46 kg/m.           Physical Examination:   General appearance: alert, well appearing, and in no distress  Mental status: alert, oriented to person, place, and time  Skin: warm & dry   Extremities: Edema: None    Cardiovascular: normal heart rate noted  Respiratory: normal respiratory effort, no distress  Abdomen: gravid, soft, non-tender  Pelvic: Cervical exam deferred        Pt started feeling hypoglycemic while sitting on the couch in my office, CBG 42, gave her a cookie to eat as well as ham & 12/15/2019. Pt's mom with her and will drive her home.  Unable to obtain FHR via doppler, informal TA u/s w/o FCA or fetal movement, worked in Social research officer, government for formal u/s  Formal U/S:  [name] 18+3 wks(by LMP),cephalic,no fetal heart tones,anterior placenta,SVP of fluid 2.2 cm,bicornuate uterus,EFW 75 g= 13+6 wks,Nina Phillips review images during ultrasound and discussed results with patient   Fetal Status: Fetal Heart Rate (bpm): 0   Movement: Present    Fetal Surveillance Testing today: u/s   Chaperone: N/A    Results for orders placed or performed in visit on 10/06/21 (from the past 24 hour(s))  POC Urinalysis Dipstick OB   Collection Time: 10/06/21  1:45 PM  Result Value Ref Range   Color, UA     Clarity, UA     Glucose, UA Negative Negative   Bilirubin, UA     Ketones, UA neg    Spec Grav, UA     Blood, UA large    pH, UA     POC,PROTEIN,UA Large (3+) Negative, Trace, Small (1+), Moderate (2+), Large  (3+), 4+   Urobilinogen, UA     Nitrite, UA neg    Leukocytes, UA Negative Negative   Appearance     Odor    POCT glucose (manual entry)   Collection Time: 10/06/21  2:54 PM  Result Value Ref Range   POC Glucose 42 (A) 70 - 99 mg/dl    Assessment & Plan:  High-risk pregnancy: G7P0150 at [redacted]w[redacted]d with an Estimated Date of Delivery: 03/06/22   1) Missed AB, 18.3wks by LMP c/w 12wk u/s, fetus measuring 13.6wks on u/s w/o FCA. Last felt fm yesterday. Had doppler FHR of 141 on [facility] visit  4 days ago 9/8 @ 17.6wks, informal TA u/s in [facility] at 15wks FHR 178. Discussed w/ Dr. Charlotta Newton, can offer cytotec vs D&C. Pt wants D&C, has done cytotec in past and does not want to do again. Spoke w/ Dr. Despina Hidden (2nd attending at [facility] this week), pt to stop heparin (last dose was this morning), case posted for Sat 9/16 @ 0730, pt to expect call from hospital w/ more info. Reviewed warning s/s & reasons to seek care including excessive bleeding, fever/chills, etc. Wants testing to see why she can't stay pregnant, she can discuss this further w/ Dr. Despina Hidden. Pt wants Anora testing on fetus.   2) Hypoglycemia, 42, ate cookie, also gave her ham & [place], did not want to wait any longer, felt ok to go home. Mom with her and will drive her.   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 Pt wants Anora testing on fetus  Meds: No orders of the defined types were placed in this encounter.   Labs/procedures today: U/S and fingerstick glucose  Follow-up: Return for cancel appts, will schedule f/u after D&C.   Future Appointments  Date Time Provider Department Center  10/15/2021  1:50 PM 10/17/2021, CNM [facility]  10/29/2021  2:15 PM [facility] NURSE [facility]  10/29/2021  2:30 PM [facility] US2 [facility]  10/30/2021 10:30 AM [place], 12/30/2021, MD [facility]    Orders  Placed This Encounter  Procedures   POC Urinalysis Dipstick OB   POCT glucose (manual entry)   Amaryllis Dyke CNM, [facility] 10/06/2021 3:53 PM

## 2021-10-06 NOTE — Progress Notes (Signed)
[name] 18+3 wks(by LMP),cephalic,no fetal heart tones,anterior placenta,SVP of fluid,bicornuate uterus,EFW 75 g= 13+6 wks,Kim Booker review images during ultrasound and discussed results with patient

## 2021-10-07 ENCOUNTER — Emergency Department (HOSPITAL_COMMUNITY): Admission: EM | Admit: 2021-10-07 | Discharge: 2021-10-07 | Discharge status: Against Medical Advice | Payer: 59

## 2021-10-08 ENCOUNTER — Encounter (HOSPITAL_COMMUNITY): Payer: Self-pay | Admitting: Certified Registered"

## 2021-10-08 ENCOUNTER — Telehealth: Payer: Self-pay | Admitting: *Deleted

## 2021-10-08 NOTE — Telephone Encounter (Signed)
Spoke with patient she states that she passed the baby in the sac along with placenta. All fully intact. She is having minimal cramping and bleeding at this time. I discussed patient with Dr. Charlotta Newton she advised that patient can cancel procedure and schedule appointment with Dr. Despina Hidden next. I helped patient schedule appointment for next Tuesday. Secure message to Dr. Despina Hidden letting him know about patient. Pt given precautions for calling us or going to [facility]. If she begins bleeding heavy or develops fevers she is aware to let us know.

## 2021-10-10 ENCOUNTER — Encounter (HOSPITAL_COMMUNITY): Admission: RE | Payer: Self-pay | Source: Home / Self Care

## 2021-10-10 ENCOUNTER — Ambulatory Visit (HOSPITAL_COMMUNITY): Admission: RE | Admit: 2021-10-10 | Payer: 59 | Source: Home / Self Care | Admitting: Obstetrics & Gynecology

## 2021-10-10 SURGERY — DILATION AND EVACUATION, UTERUS
Anesthesia: General

## 2021-10-13 ENCOUNTER — Ambulatory Visit: Payer: 59 | Admitting: Obstetrics & Gynecology

## 2021-10-13 ENCOUNTER — Encounter: Payer: Self-pay | Admitting: *Deleted

## 2021-10-15 ENCOUNTER — Ambulatory Visit: Payer: 59 | Admitting: Obstetrics & Gynecology

## 2021-10-15 ENCOUNTER — Encounter: Payer: Self-pay | Admitting: Obstetrics & Gynecology

## 2021-10-15 ENCOUNTER — Encounter: Payer: 59 | Admitting: Advanced Practice Midwife

## 2021-10-15 VITALS — BP 124/82 | HR 101 | Wt 245.0 lb

## 2021-10-15 DIAGNOSIS — N96 Recurrent pregnancy loss: Secondary | ICD-10-CM

## 2021-10-15 DIAGNOSIS — O021 Missed abortion: Secondary | ICD-10-CM | POA: Diagnosis not present

## 2021-10-15 MED ORDER — DESOGESTREL-ETHINYL ESTRADIOL 0.15-30 MG-MCG PO TABS: 1.0000 | ORAL_TABLET | Freq: Every day | ORAL | 11 refills | Status: DC

## 2021-10-15 NOTE — Progress Notes (Signed)
Follow up appointment for post SAB:   Chief Complaint  Patient presents with   Follow-up    Miscarriage     Blood pressure 124/82, pulse (!) 101, weight 245 lb (111.1 kg), not currently breastfeeding.    Was scheduled for D&C but patient spontaneously miscarried at home, pregnancy placenta all at the same time intact  IT-->1/10 risk of Trisomy 18, biochemical values quite abnormal  Today no complaints nom bleeding no cramping Bleeding stopped same day as loss  General WDWN female NAD Vulva:  normal appearing vulva with no masses, tenderness or lesions Vagina:  normal mucosa, no discharge Cervix:  Normal no lesions Uterus:  normal size, contour, position, consistency, mobility, non-tender Adnexa: ovaries:present,  normal adnexa in size, nontender and no masses   MEDS ordered this encounter: Meds ordered this encounter  Medications   desogestrel-ethinyl estradiol (APRI) 0.15-30 MG-MCG tablet    Sig: Take 1 tablet by mouth daily.    Dispense:  28 tablet    Refill:  11    Orders for this encounter: No orders of the defined types were placed in this encounter.   Impression + Management Plan   ICD-10-CM   1. Missed abortion  O02.1     2. Recurrent pregnancy loss  N96    reviewed current and present labs, recommend no pregnancy for 6 months, this loss likely genetic or at least uteroplacnetal in origin, recommend lovenox       Follow Up: Return in about 6 months (around 04/15/2022) for Follow up, with Dr Elonda Husky.     All questions were answered.  Past Medical History:  Diagnosis Date   Abnormal uterine bleeding (AUB) 07/09/2019   Anemia    Anxiety    Bicornate uterus    Depression    seeing a therapist since 3rd miscarriage, helping, doing good.   Diabetes mellitus without complication (HCC)    Type 2   Gout    History of miscarriage    HSV-2 infection    Hypertension    Ovarian cyst    Trichomonas infection 08/24/2018   Treated 08/17/18    Past  Surgical History:  Procedure Laterality Date   DILATION AND CURETTAGE OF UTERUS     WISDOM TOOTH EXTRACTION      OB History     Gravida  7   Para  1   Term      Preterm  1   AB  5   Living  0      SAB  5   IAB      Ectopic      Multiple  0   Live Births              Allergies  Allergen Reactions   Apple Fruit Extract Hives   Banana Hives   Other Hives    Walnuts    Shellfish Allergy Hives   Tomato Hives   Watermelon [Citrullus Vulgaris] Hives   Bactrim [Sulfamethoxazole-Trimethoprim] Hives and Rash   Sulfa Antibiotics Hives and Rash    Social History   Socioeconomic History   Marital status: Married    Spouse name: Not on file   Number of children: Not on file   Years of education: Not on file   Highest education level: Not on file  Occupational History   Not on file  Tobacco Use   Smoking status: Former   Smokeless tobacco: Never   Tobacco comments:    quit 2014  Vaping Use  Vaping Use: Never used  Substance and Sexual Activity   Alcohol use: No    Comment: occas   Drug use: Never   Sexual activity: Yes    Birth control/protection: None  Other Topics Concern   Not on file  Social History Narrative   Not on file   Social Determinants of Health   Financial Resource Strain: Low Risk  (08/28/2021)   Overall Financial Resource Strain (CARDIA)    Difficulty of Paying Living Expenses: Not hard at all  Food Insecurity: No Food Insecurity (08/28/2021)   Hunger Vital Sign    Worried About Running Out of Food in the Last Year: Never true    Ran Out of Food in the Last Year: Never true  Transportation Needs: No Transportation Needs (08/28/2021)   PRAPARE - Administrator, Civil Service (Medical): No    Lack of Transportation (Non-Medical): No  Physical Activity: Insufficiently Active (08/28/2021)   Exercise Vital Sign    Days of Exercise per Week: 4 days    Minutes of Exercise per Session: 30 min  Stress: No Stress Concern  Present (08/28/2021)   [employer] of Occupational Health - Occupational Stress Questionnaire    Feeling of Stress : Only a little  Social Connections: Moderately Integrated (08/28/2021)   Social Connection and Isolation Panel [NHANES]    Frequency of Communication with Friends and Family: More than three times a week    Frequency of Social Gatherings with Friends and Family: Once a week    Attends Religious Services: More than 4 times per year    Active Member of [employer] or Organizations: No    Attends Engineer, structural: Never    Marital Status: Married    Family History  Problem Relation Age of Onset   Hypertension Mother    Gout Mother    Diabetes Father    Hypertension Father    Stroke Father    Hypertension Maternal Aunt    Cancer Maternal Grandmother        breast   Hypertension Maternal Grandfather    Heart attack Maternal Grandfather    Stroke Paternal Grandmother    Diabetes Paternal Grandfather    Cancer Other    Hypertension Other    Diabetes Other

## 2021-10-16 ENCOUNTER — Encounter: Payer: BLUE CROSS/BLUE SHIELD | Admitting: Obstetrics & Gynecology

## 2021-10-16 ENCOUNTER — Other Ambulatory Visit: Payer: BLUE CROSS/BLUE SHIELD

## 2021-10-29 ENCOUNTER — Other Ambulatory Visit: Payer: 59

## 2021-10-29 ENCOUNTER — Ambulatory Visit: Payer: 59

## 2021-10-30 ENCOUNTER — Encounter: Payer: 59 | Admitting: Obstetrics & Gynecology

## 2021-12-15 ENCOUNTER — Ambulatory Visit
Admission: EM | Admit: 2021-12-15 | Discharge: 2021-12-15 | Disposition: A | Discharge status: Home / Self Care | Payer: 59 | Attending: Family Medicine | Admitting: Family Medicine

## 2021-12-15 DIAGNOSIS — S39012A Strain of muscle, fascia and tendon of lower back, initial encounter: Secondary | ICD-10-CM | POA: Diagnosis not present

## 2021-12-15 MED ORDER — TIZANIDINE HCL 4 MG PO CAPS: 4.0000 mg | ORAL_CAPSULE | Freq: Three times a day (TID) | ORAL | 0 refills | Status: DC | PRN

## 2021-12-15 MED ORDER — NAPROXEN 375 MG PO TABS: 375.0000 mg | ORAL_TABLET | Freq: Two times a day (BID) | ORAL | 0 refills | Status: DC | PRN
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
## 2022-01-08 ENCOUNTER — Ambulatory Visit: Payer: 59 | Admitting: Internal Medicine

## 2022-01-08 NOTE — Progress Notes (Deleted)
Name: Nina Phillips  Age/ Sex: 31 y.o., female   MRN/ DOB: [id number], 12/04/1990     PCP: Arvilla Market, MD (Inactive)   Reason for Endocrinology Evaluation: Type 2 Diabetes Mellitus  Initial Endocrine Consultative Visit:  09/08/2018    PATIENT IDENTIFIER: Nina Phillips is a 31 y.o. female with a past medical history of T2DM. The patient has followed with Endocrinology clinic since 09/08/2018 for consultative assistance with management of her diabetes.  DIABETIC HISTORY:  Nina Phillips was diagnosed with T2 DM in 06/2018. Her hemoglobin A1c has ranged from 10.6% on her initial diagnosis.  On her initial visit to our clinic she had an A1c of 10.3%, she was on Lantus only, we started Metformin. Invokana caused yeast infection .   She had ~ 5 miscarriages in 2020, and again in 2021. Had fetal demise at 21 weeks  in 2022   Lives with boyfriend and his grandmother  SUBJECTIVE:   During the last visit (10/01/2020): A1c 11.0 %   Today (01/08/2022): Nina Phillips is here for a follow up on diabetes. The pt has NOT been to our clinic in 15 months. Since her last visit here she had a missed abortion     HOME DIABETES REGIMEN:  Metformin 500 tablet,2 tabs BID  Semglee  30 units daily       METER DOWNLOAD SUMMARY:did not bring        DIABETIC COMPLICATIONS: Microvascular complications:   Denies: CKD, Neuropathy, retinopathy    Macrovascular complications:   Denies: CAD, CVA, PVD   HISTORY:  Past Medical History:  Past Medical History:  Diagnosis Date   Abnormal uterine bleeding (AUB) 07/09/2019   Anemia    Anxiety    Bicornate uterus    Depression    seeing a therapist since 3rd miscarriage, helping, doing good.   Diabetes mellitus without complication (HCC)    Type 2   Gout    History of miscarriage    HSV-2 infection    Hypertension    Ovarian cyst    Trichomonas infection 08/24/2018   Treated 08/17/18   Past Surgical History:  Past Surgical  History:  Procedure Laterality Date   DILATION AND CURETTAGE OF UTERUS     WISDOM TOOTH EXTRACTION     Social History:  reports that she has quit smoking. She has never used smokeless tobacco. She reports that she does not drink alcohol and does not use drugs. Family History:  Family History  Problem Relation Age of Onset   Hypertension Mother    Gout Mother    Diabetes Father    Hypertension Father    Stroke Father    Hypertension Maternal Aunt    Cancer Maternal Grandmother        breast   Hypertension Maternal Grandfather    Heart attack Maternal Grandfather    Stroke Paternal Grandmother    Diabetes Paternal Grandfather    Cancer Other    Hypertension Other    Diabetes Other      HOME MEDICATIONS: Allergies as of 01/08/2022       Reactions   Apple Fruit Extract Hives   Banana Hives   Other Hives   Walnuts   Shellfish Allergy Hives   Tomato Hives   Watermelon [citrullus Vulgaris] Hives   Bactrim [sulfamethoxazole-trimethoprim] Hives, Rash   Sulfa Antibiotics Hives, Rash        Medication List        Accurate as of January 08, 2022 10:46 AM.  If you have any questions, ask your nurse or doctor.          aspirin 81 MG chewable tablet Chew by mouth daily.   cyclobenzaprine 10 MG tablet Commonly known as: FLEXERIL Take 1 tablet (10 mg total) by mouth 2 (two) times daily as needed for muscle spasms.   desogestrel-ethinyl estradiol 0.15-30 MG-MCG tablet Commonly known as: APRI Take 1 tablet by mouth daily.   folic acid 1 MG tablet Commonly known as: FOLVITE Take 1 mg by mouth daily.   Heparin Sodium (Porcine) 5000 UNIT/0.5ML Sosy Inject 5,000 Units as directed 2 (two) times daily.   insulin aspart 100 UNIT/ML injection Commonly known as: NovoLOG Inject 2 Units into the skin 3 (three) times daily before meals.   insulin glargine-yfgn 100 UNIT/ML injection Commonly known as: Semglee (yfgn) Inject 0.3 mLs (30 Units total) into the skin daily.    Insulin Pen Needle 31G X 5 MM Misc 1 Device by Does not apply route daily.   Insulin Syringe-Needle U-100 30G X 5/16" 0.3 ML Misc Commonly known as: Global Insulin Syringes 1 Syringe by Does not apply route 3 (three) times daily.   iron polysaccharides 150 MG capsule Commonly known as: NIFEREX Take 1 capsule (150 mg total) by mouth daily.   metFORMIN 500 MG 24 hr tablet Commonly known as: GLUCOPHAGE-XR Take 2 tablets (1,000 mg total) by mouth in the morning and at bedtime.   naproxen 375 MG tablet Commonly known as: NAPROSYN Take 1 tablet (375 mg total) by mouth 2 (two) times daily as needed.   NIFEdipine 30 MG 24 hr tablet Commonly known as: Procardia XL Take 1 tablet (30 mg total) by mouth daily.   PRENATAL GUMMIES PO Take by mouth.   tiZANidine 4 MG capsule Commonly known as: Zanaflex Take 1 capsule (4 mg total) by mouth 3 (three) times daily as needed for muscle spasms. Do not drink alcohol or drive while taking this medication.  May cause drowsiness.         OBJECTIVE:   Vital Signs: LMP  (Within Weeks)   Wt Readings from Last 3 Encounters:  10/15/21 245 lb (111.1 kg)  10/06/21 251 lb (113.9 kg)  10/02/21 246 lb 14.4 oz (112 kg)     Exam: General: Pt appears well and is in NAD  Lungs: Clear with good BS bilat with no rales, rhonchi, or wheezes  Heart: RRR with normal S1 and S2 and no gallops; no murmurs; no rub  Extremities: No pretibial edema  Neuro: MS is good with appropriate affect, pt is alert and Ox3    DM foot exam: 05/21/2020    The skin of the feet is intact without sores or ulcerations. The pedal pulses are 2+ on right and 2+ on left. The sensation is intact to a screening 5.07, 10 gram monofilament bilaterally     DATA REVIEWED:  Lab Results  Component Value Date   HGBA1C 6.0 (H) 08/28/2021   HGBA1C 11.0 (A) 10/01/2020   HGBA1C 7.6 (A) 05/21/2020   Results for [facility] (MRN [id number]) as of 10/01/2020 15:17  Ref. Range  10/01/2020 08:34  Total CHOL/HDL Ratio Unknown 4  Cholesterol Latest Ref Range: 0 - 200 mg/dL 629  HDL Cholesterol Latest Ref Range: >39.00 mg/dL 52.84  LDL (calc) Latest Ref Range: 0 - 99 mg/dL 132 (H)  MICROALB/CREAT RATIO Latest Ref Range: 0.0 - 30.0 mg/g 35.2 (H)  NonHDL Unknown 138.05  Triglycerides Latest Ref Range: 0.0 - 149.0 mg/dL 440.0  VLDL Latest Ref Range: 0.0 - 40.0 mg/dL 45.8  VITD Latest Ref Range: 30.00 - 100.00 ng/mL 11.51 (L)  Vitamin B12 Latest Ref Range: 211 - 911 pg/mL 404  TSH Latest Ref Range: 0.35 - 5.50 uIU/mL 3.36  T4,Free(Direct) Latest Ref Range: 0.60 - 1.60 ng/dL 0.99  Creatinine,U Latest Units: mg/dL 833.8  Microalb, Ur Latest Ref Range: 0.0 - 1.9 mg/dL 25.0 (H)   Results for [facility] (MRN [id number]) as of 10/01/2020 08:26  Ref. Range 08/26/2020 15:54  Sodium Latest Ref Range: 135 - 145 mmol/L 136  Potassium Latest Ref Range: 3.5 - 5.1 mmol/L 3.8  Chloride Latest Ref Range: 98 - 111 mmol/L 107  CO2 Latest Ref Range: 22 - 32 mmol/L 24  Glucose Latest Ref Range: 70 - 99 mg/dL 937 (H)  BUN Latest Ref Range: 6 - 20 mg/dL 14  Creatinine Latest Ref Range: 0.44 - 1.00 mg/dL 9.02  Calcium Latest Ref Range: 8.9 - 10.3 mg/dL 8.5 (L)  Anion gap Latest Ref Range: 5 - 15  5  GFR, Estimated Latest Ref Range: >60 mL/min >60   ASSESSMENT / PLAN / RECOMMENDATIONS:   1) Type 2 Diabetes Mellitus, Sub -Optimally Controlled, With microalbuminuria  - Most recent A1c of 11.0 %. Goal A1c < 6.5 %.      Her A1c is up from 7.6 %  - Her main barriers to diabetes care is depression. She recently lost her grandmother , she was the care taker, prior to that had a fetal demise. She had stopped taking metformin , she admits to dietary indiscretions during that time  - I am going to restart Metformin gradually and increase insulin as below  - Intolerant to SGLT-2 inhibitors due to recurrent yeast infection  - If glucose not controlled with metformin and basal insulin will  consider GLP-1 agonist on next visit     MEDICATIONS: - Restart  Metformin 500 mg, 2 tabs with Breakfast and two tabs with supper  - Increase  Lantus to 30   units daily    EDUCATION / INSTRUCTIONS: BG monitoring instructions: Patient is instructed to check her blood sugars 1 times a day Call [place] [facility] if: BG persistently < 70  I reviewed the Rule of 15 for the treatment of hypoglycemia in detail with the patient. Literature supplied.     2) Diabetic complications:  Eye: Does not have known diabetic retinopathy.  Neuro/ Feet: Does not have known diabetic peripheral neuropathy. Renal: Patient does not have known baseline CKD.      3) Dyslipidemia :  - Lipids today show slight elevation of LDL, will encourage low-fat diet, no indication for statin therapy     F/U in 4 months    Signed electronically by: Lyndle Herrlich, MD  [facility] End[facility]  [facility] [address] [place] [place], [place] [address] Phone: [phone] FAX: [phone]   CC: Arvilla Market, MD (Inactive) No address on file Phone: None  Fax: None  Return to Endocrinology clinic as below: Future Appointments  Date Time Provider Department Center  01/08/2022  2:00 PM Edison Nicholson, Konrad Dolores, MD [id number] None

## 2022-02-03 ENCOUNTER — Ambulatory Visit (INDEPENDENT_AMBULATORY_CARE_PROVIDER_SITE_OTHER): Payer: 59 | Admitting: Internal Medicine

## 2022-02-03 ENCOUNTER — Encounter: Payer: Self-pay | Admitting: Internal Medicine

## 2022-02-03 ENCOUNTER — Telehealth: Payer: Self-pay

## 2022-02-03 VITALS — BP 140/80 | HR 92 | Ht 63.0 in | Wt 250.0 lb

## 2022-02-03 DIAGNOSIS — R809 Proteinuria, unspecified: Secondary | ICD-10-CM

## 2022-02-03 DIAGNOSIS — Z794 Long term (current) use of insulin: Secondary | ICD-10-CM

## 2022-02-03 DIAGNOSIS — E1129 Type 2 diabetes mellitus with other diabetic kidney complication: Secondary | ICD-10-CM | POA: Diagnosis not present

## 2022-02-03 DIAGNOSIS — E785 Hyperlipidemia, unspecified: Secondary | ICD-10-CM | POA: Diagnosis not present

## 2022-02-03 LAB — POCT GLYCOSYLATED HEMOGLOBIN (HGB A1C): Hemoglobin A1C: 6.6 % — AB (ref 4.0–5.6)

## 2022-02-03 LAB — MICROALBUMIN / CREATININE URINE RATIO
Creatinine,U: 89 mg/dL
Microalb Creat Ratio: 110.1 mg/g — ABNORMAL HIGH (ref 0.0–30.0)
Microalb, Ur: 98 mg/dL — ABNORMAL HIGH (ref 0.0–1.9)

## 2022-02-03 LAB — POCT GLUCOSE (DEVICE FOR HOME USE): Glucose Fasting, POC: 131 mg/dL — AB (ref 70–99)

## 2022-02-03 MED ORDER — TRESIBA FLEXTOUCH 100 UNIT/ML ~~LOC~~ SOPN: 30.0000 [IU] | PEN_INJECTOR | Freq: Every day | SUBCUTANEOUS | 3 refills | Status: DC

## 2022-02-03 MED ORDER — INSULIN GLARGINE-YFGN 100 UNIT/ML ~~LOC~~ SOLN: 30.0000 [IU] | Freq: Every day | SUBCUTANEOUS | 3 refills | Status: DC

## 2022-02-03 MED ORDER — INSULIN PEN NEEDLE 31G X 5 MM MISC
1.0000 | Freq: Every day | 3 refills | Status: DC
Start: 2022-02-03 — End: 2022-04-30

## 2022-02-03 MED ORDER — METFORMIN HCL ER 500 MG PO TB24: 1000.0000 mg | ORAL_TABLET | Freq: Two times a day (BID) | ORAL | 3 refills | Status: DC

## 2022-02-03 NOTE — Progress Notes (Unsigned)
Name: [name]  Age/ Sex: 32 y.o., female   MRN/ DOB: [id number], 23-Aug-1990     PCP: Arvilla Market, MD (Inactive)   Reason for Endocrinology Evaluation: Type 2 Diabetes Mellitus  Initial Endocrine Consultative Visit:  09/08/2018    PATIENT IDENTIFIER: [name] is a 32 y.o. female with a past medical history of T2DM. The patient has followed with Endocrinology clinic since 09/08/2018 for consultative assistance with management of her diabetes.  DIABETIC HISTORY:  [name] was diagnosed with T2 DM in 06/2018. Her hemoglobin A1c has ranged from 10.6% on her initial diagnosis.  On her initial visit to our clinic she had an A1c of 10.3%, she was on Lantus only, we started Metformin. Invokana caused yeast infection .   She had ~ 5 miscarriages in 2020, and again in 2021. Had fetal demise at 21 weeks  in 2022   Lives with boyfriend and his grandmother  SUBJECTIVE:   During the last visit (10/01/2020): A1c 11.0 %   Today (02/03/2022): [name] is here for a follow up on diabetes. The pt has NOT been to our clinic in 15 months. Since her last visit here she had a missed abortion   She brought her meter today but has not used it since September.  She is on OCP's at this time  Denies nausea, vomiting but has diarrhea with Metformin but this is manageable    HOME DIABETES REGIMEN:  Metformin 500 tablet,2 tabs BID  Semglee  30 units daily       METER DOWNLOAD SUMMARY:Has not been checking         DIABETIC COMPLICATIONS: Microvascular complications:   Denies: CKD, Neuropathy, retinopathy    Macrovascular complications:   Denies: CAD, CVA, PVD   HISTORY:  Past Medical History:  Past Medical History:  Diagnosis Date   Abnormal uterine bleeding (AUB) 07/09/2019   Anemia    Anxiety    Bicornate uterus    Depression    seeing a therapist since 3rd miscarriage, helping, doing good.   Diabetes mellitus without complication (HCC)    Type 2    Gout    History of miscarriage    HSV-2 infection    Hypertension    Ovarian cyst    Trichomonas infection 08/24/2018   Treated 08/17/18   Past Surgical History:  Past Surgical History:  Procedure Laterality Date   DILATION AND CURETTAGE OF UTERUS     WISDOM TOOTH EXTRACTION     Social History:  reports that she has quit smoking. She has never used smokeless tobacco. She reports that she does not drink alcohol and does not use drugs. Family History:  Family History  Problem Relation Age of Onset   Hypertension Mother    Gout Mother    Diabetes Father    Hypertension Father    Stroke Father    Hypertension Maternal Aunt    Cancer Maternal Grandmother        breast   Hypertension Maternal Grandfather    Heart attack Maternal Grandfather    Stroke Paternal Grandmother    Diabetes Paternal Grandfather    Cancer Other    Hypertension Other    Diabetes Other      HOME MEDICATIONS: Allergies as of 02/03/2022       Reactions   Apple Fruit Extract Hives   Banana Hives   Other Hives   Walnuts   Shellfish Allergy Hives   Tomato Hives   Watermelon [citrullus Vulgaris] Hives  Bactrim [sulfamethoxazole-trimethoprim] Hives, Rash   Sulfa Antibiotics Hives, Rash        Medication List        Accurate as of February 03, 2022  8:52 AM. If you have any questions, ask your nurse or doctor.          aspirin 81 MG chewable tablet Chew by mouth daily.   cyclobenzaprine 10 MG tablet Commonly known as: FLEXERIL Take 1 tablet (10 mg total) by mouth 2 (two) times daily as needed for muscle spasms.   desogestrel-ethinyl estradiol 0.15-30 MG-MCG tablet Commonly known as: APRI Take 1 tablet by mouth daily.   folic acid 1 MG tablet Commonly known as: FOLVITE Take 1 mg by mouth daily.   Heparin Sodium (Porcine) 5000 UNIT/0.5ML Sosy Inject 5,000 Units as directed 2 (two) times daily.   insulin aspart 100 UNIT/ML injection Commonly known as: NovoLOG Inject 2 Units  into the skin 3 (three) times daily before meals.   insulin glargine-yfgn 100 UNIT/ML injection Commonly known as: Semglee (yfgn) Inject 0.3 mLs (30 Units total) into the skin daily.   Insulin Pen Needle 31G X 5 MM Misc 1 Device by Does not apply route daily.   Insulin Syringe-Needle U-100 30G X 5/16" 0.3 ML Misc Commonly known as: Global Insulin Syringes 1 Syringe by Does not apply route 3 (three) times daily.   iron polysaccharides 150 MG capsule Commonly known as: NIFEREX Take 1 capsule (150 mg total) by mouth daily.   metFORMIN 500 MG 24 hr tablet Commonly known as: GLUCOPHAGE-XR Take 2 tablets (1,000 mg total) by mouth in the morning and at bedtime.   naproxen 375 MG tablet Commonly known as: NAPROSYN Take 1 tablet (375 mg total) by mouth 2 (two) times daily as needed.   NIFEdipine 30 MG 24 hr tablet Commonly known as: Procardia XL Take 1 tablet (30 mg total) by mouth daily.   PRENATAL GUMMIES PO Take by mouth.   tiZANidine 4 MG capsule Commonly known as: Zanaflex Take 1 capsule (4 mg total) by mouth 3 (three) times daily as needed for muscle spasms. Do not drink alcohol or drive while taking this medication.  May cause drowsiness.         OBJECTIVE:   Vital Signs: BP (!) 140/80 (BP Location: Left Arm, Patient Position: Sitting, Cuff Size: Large)   Pulse 92   Ht 5\' 3"  (1.6 m)   Wt 250 lb (113.4 kg)   SpO2 98%   BMI 44.29 kg/m   Wt Readings from Last 3 Encounters:  02/03/22 250 lb (113.4 kg)  10/15/21 245 lb (111.1 kg)  10/06/21 251 lb (113.9 kg)     Exam: General: Pt appears well and is in NAD  Lungs: Clear with good BS bilat with no rales, rhonchi, or wheezes  Heart: RRR with normal S1 and S2 and no gallops; no murmurs; no rub  Extremities: No pretibial edema  Neuro: MS is good with appropriate affect, pt is alert and Ox3    DM foot exam: 02/03/2022    The skin of the feet is intact without sores or ulcerations. The pedal pulses are 2+ on  right and 2+ on left. The sensation is intact to a screening 5.07, 10 gram monofilament bilaterally     DATA REVIEWED:  Lab Results  Component Value Date   HGBA1C 6.6 (A) 02/03/2022   HGBA1C 6.0 (H) 08/28/2021   HGBA1C 11.0 (A) 10/01/2020    Latest Reference Range & Units 10/02/21 12:16  Sodium  135 - 145 mmol/L 136  Potassium 3.5 - 5.1 mmol/L 3.9  Chloride 98 - 111 mmol/L 106  CO2 22 - 32 mmol/L 21 (L)  Glucose 70 - 99 mg/dL 125 (H)  BUN 6 - 20 mg/dL 6  Creatinine 0.44 - 1.00 mg/dL 1.03 (H)  Calcium 8.9 - 10.3 mg/dL 9.6  Anion gap 5 - 15  9  Alkaline Phosphatase 38 - 126 U/L 71  Albumin 3.5 - 5.0 g/dL 2.7 (L)  AST 15 - 41 U/L 13 (L)  ALT 0 - 44 U/L 11  Total Protein 6.5 - 8.1 g/dL 8.0  Total Bilirubin 0.3 - 1.2 mg/dL 0.1 (L)  GFR, Estimated >60 mL/min >60     In office BG 131 mg/dL  ASSESSMENT / PLAN / RECOMMENDATIONS:   1) Type 2 Diabetes Mellitus, Optimally Controlled, With microalbuminuria  - Most recent A1c of 6.6 %. Goal A1c < 6.5 %.    - A1c at goal  - Intolerant to SGLT-2 inhibitors due to recurrent yeast infection and hx of DKA  - We breifly discussed GLP-1 agonist but if she is going to conceieve again within a year, no need to change her current regimen is safe and would not need to  be changed with pregnancy  - Emphasized importance of having an eye exam  - NO change  - Emphasized importance of glucose checks at home, if not once daily she should at least check it a couple of times a day [employer] would not cover Lantus/Basaglar, will switch to [place]   MEDICATIONS: - Continue  Metformin 500 mg, 2 tabs with Breakfast and two tabs with supper  -Take Tresiba 30   units daily    EDUCATION / INSTRUCTIONS: BG monitoring instructions: Patient is instructed to check her blood sugars 1 times a day Call [place] [facility] if: BG persistently < 70  I reviewed the Rule of 15 for the treatment of hypoglycemia in detail with the patient. Literature  supplied.     2) Diabetic complications:  Eye: Does not have known diabetic retinopathy.  Neuro/ Feet: Does not have known diabetic peripheral neuropathy. Renal: Patient does not have known baseline CKD.      3) Dyslipidemia :  - Lipids panel showed slight elevation of LDL, she has been following a low fat diet    4) Microalbuminuria :  - This was elevated in 2022, repeat today shows worsening  - Discussed ACEI/ARB in adding further renal protection but they are contra-indicated during pregnancy    Medication  Start Losartan 25 mg daily   F/U in 6 months    Signed electronically by: Mack Guise, MD  [facility]  [facility] [address] [place], [place] [address] Phone: [phone] FAX: [phone]   CC: Nicolette Bang, MD (Inactive) No address on file Phone: None  Fax: None  Return to Endocrinology clinic as below: No future appointments.

## 2022-02-03 NOTE — Telephone Encounter (Signed)
Per [employer] will not cover the Semglee. Alternatives are Alysia Penna, and Levemir

## 2022-02-03 NOTE — Telephone Encounter (Signed)
Tresiba sent

## 2022-02-03 NOTE — Patient Instructions (Signed)
-   Continue Metformin 2 tablets twice daily  -Continue Lantus to 30 units once daily      HOW TO TREAT LOW BLOOD SUGARS (Blood sugar LESS THAN 70 MG/DL) Please follow the RULE OF 15 for the treatment of hypoglycemia treatment (when your (blood sugars are less than 70 mg/dL)   STEP 1: Take 15 grams of carbohydrates when your blood sugar is low, which includes:  3-4 GLUCOSE TABS  OR 3-4 OZ OF JUICE OR REGULAR SODA OR ONE TUBE OF GLUCOSE GEL    STEP 2: RECHECK blood sugar in 15 MINUTES STEP 3: If your blood sugar is still low at the 15 minute recheck --> then, go back to STEP 1 and treat AGAIN with another 15 grams of carbohydrates.

## 2022-02-04 ENCOUNTER — Other Ambulatory Visit: Payer: Self-pay | Admitting: Internal Medicine

## 2022-02-04 MED ORDER — LOSARTAN POTASSIUM 25 MG PO TABS: 25.0000 mg | ORAL_TABLET | Freq: Every day | ORAL | 2 refills | Status: DC

## 2022-02-05 ENCOUNTER — Other Ambulatory Visit: Payer: Self-pay | Admitting: Internal Medicine

## 2022-02-05 MED ORDER — TRESIBA FLEXTOUCH 100 UNIT/ML ~~LOC~~ SOPN
30.0000 [IU] | PEN_INJECTOR | Freq: Every day | SUBCUTANEOUS | 3 refills | Status: DC
Start: 2022-02-05 — End: 2022-10-30

## 2022-02-05 NOTE — Progress Notes (Signed)
Tyler Aas already sent 02/03/2022

## 2022-02-08 ENCOUNTER — Encounter: Payer: Self-pay | Admitting: Internal Medicine

## 2022-02-18 ENCOUNTER — Other Ambulatory Visit (HOSPITAL_COMMUNITY): Payer: Self-pay

## 2022-03-01 ENCOUNTER — Encounter: Payer: Self-pay | Admitting: Internal Medicine

## 2022-03-18 ENCOUNTER — Other Ambulatory Visit: Payer: Self-pay | Admitting: Neurology

## 2022-04-05 ENCOUNTER — Ambulatory Visit
Admission: EM | Admit: 2022-04-05 | Discharge: 2022-04-05 | Disposition: A | Discharge status: Home / Self Care | Payer: 59 | Attending: Family Medicine | Admitting: Family Medicine

## 2022-04-05 VITALS — BP 174/97 | HR 84 | Temp 97.7°F | Resp 17

## 2022-04-05 DIAGNOSIS — I10 Essential (primary) hypertension: Secondary | ICD-10-CM

## 2022-04-05 DIAGNOSIS — M109 Gout, unspecified: Secondary | ICD-10-CM

## 2022-04-05 DIAGNOSIS — M25572 Pain in left ankle and joints of left foot: Secondary | ICD-10-CM

## 2022-04-05 MED ORDER — INDOMETHACIN 50 MG PO CAPS: 50.0000 mg | ORAL_CAPSULE | Freq: Three times a day (TID) | ORAL | 0 refills | Status: DC

## 2022-04-05 MED ORDER — COLCHICINE 0.6 MG PO TABS: ORAL_TABLET | ORAL | 0 refills | Status: DC

## 2022-04-05 NOTE — Discharge Instructions (Signed)
Your blood pressure was noted to be elevated during your visit today. If you are currently taking medication for high blood pressure, please ensure you are taking this as directed. If you do not have a history of high blood pressure and your blood pressure remains persistently elevated, you may need to begin taking a medication at some point. You may return here within the next few days to recheck if unable to see your primary care provider or if you do not have a one.  BP (!) 174/97 (BP Location: Right Arm)   Pulse 84   Temp 97.7 F (36.5 C) (Oral)   Resp 17   LMP 04/01/2022 (Exact Date)   SpO2 98%   BP Readings from Last 3 Encounters:  04/05/22 (!) 174/97  02/03/22 (!) 140/80  12/15/21 138/81

## 2022-04-05 NOTE — ED Triage Notes (Signed)
Pt states she has gout in her left ankle. States her pain and swelling started this morning. Took tylenol and states it helped with the throbbing pain but still having some pain when walking.

## 2022-04-07 NOTE — ED Provider Notes (Signed)
[facility]   [id number] 04/05/22 Arrival Time: [id number]  ASSESSMENT & PLAN:  1. Acute left ankle pain   2. Acute gout of left ankle, unspecified cause   3. Elevated blood pressure reading in office with diagnosis of hypertension     Discharge Instructions      Your blood pressure was noted to be elevated during your visit today. If you are currently taking medication for high blood pressure, please ensure you are taking this as directed. If you do not have a history of high blood pressure and your blood pressure remains persistently elevated, you may need to begin taking a medication at some point. You may return here within the next few days to recheck if unable to see your primary care provider or if you do not have a one.  BP (!) 174/97 (BP Location: Right Arm)   Pulse 84   Temp 97.7 F (36.5 C) (Oral)   Resp 17   LMP 04/01/2022 (Exact Date)   SpO2 98%   BP Readings from Last 3 Encounters:  04/05/22 (!) 174/97  02/03/22 (!) 140/80  12/15/21 138/81   No s/s of HTN urgency.  Discharge Medication List as of 04/05/2022  7:16 PM     START taking these medications   Details  colchicine 0.6 MG tablet Take two tablets as one dose followed one hour later by one tablet., Normal    indomethacin (INDOCIN) 50 MG capsule Take 1 capsule (50 mg total) by mouth 3 (three) times daily with meals., Starting Mon 04/05/2022, Normal       Needs to f/u to recheck BP also; voices understanding.  Follow-up Information     Nicolette Bang, MD.   Specialty: Family Medicine Why: If worsening or failing to improve as anticipated. Contact information: [address], [place] [address] [phone]                 Reviewed expectations re: course of current medical issues. Questions answered. Outlined signs and symptoms indicating need for more acute intervention. Understanding verbalized. After Visit Summary given.   SUBJECTIVE: History from:  Patient. Nina Phillips is a 32 y.o. female. Reports: Pt states she has gout in her left ankle. States her pain and swelling started this morning. Took tylenol and states it helped with the throbbing pain but still having some pain when walking. Denies trauma. Denies: fever.   Increased blood pressure noted today. Reports that she is treated for HTN. She reports taking medications as instructed, no orthostatic dizziness or lightheadedness, no orthopnea or paroxysmal nocturnal dyspnea, and no palpitations.   OBJECTIVE:  Vitals:   04/05/22 1858  BP: (!) 174/97  Pulse: 84  Resp: 17  Temp: 97.7 F (36.5 C)  TempSrc: Oral  SpO2: 98%    General appearance: alert; no distress Eyes: PERRLA; EOMI; conjunctiva normal CV: reg Lungs: speaks full sentences without difficulty; unlabored Extremities: mod TTP over L ankle with mild swelling and FROM; no signs of skin infection Skin: warm and dry Neurologic: normal gait Psychological: alert and cooperative; normal mood and affect    Allergies  Allergen Reactions   Apple Fruit Extract Hives   Banana Hives   Other Hives    Walnuts    Shellfish Allergy Hives   Tomato Hives   Watermelon [Citrullus Vulgaris] Hives   Bactrim [Sulfamethoxazole-Trimethoprim] Hives and Rash   Sulfa Antibiotics Hives and Rash    Past Medical History:  Diagnosis Date   Abnormal uterine bleeding (AUB) 07/09/2019  Anemia    Anxiety    Bicornate uterus    Depression    seeing a therapist since 3rd miscarriage, helping, doing good.   Diabetes mellitus without complication (HCC)    Type 2   Gout    History of miscarriage    HSV-2 infection    Hypertension    Ovarian cyst    Trichomonas infection 08/24/2018   Treated 08/17/18   Social History   Socioeconomic History   Marital status: Married    Spouse name: Not on file   Number of children: Not on file   Years of education: Not on file   Highest education level: Not on file  Occupational  History   Not on file  Tobacco Use   Smoking status: Former   Smokeless tobacco: Never   Tobacco comments:    quit 2014  Vaping Use   Vaping Use: Never used  Substance and Sexual Activity   Alcohol use: No    Comment: occas   Drug use: Never   Sexual activity: Yes    Birth control/protection: None  Other Topics Concern   Not on file  Social History Narrative   Not on file   Social Determinants of Health   Financial Resource Strain: Low Risk  (08/28/2021)   Overall Financial Resource Strain (CARDIA)    Difficulty of Paying Living Expenses: Not hard at all  Food Insecurity: No Food Insecurity (08/28/2021)   Hunger Vital Sign    Worried About Running Out of Food in the Last Year: Never true    Peters in the Last Year: Never true  Transportation Needs: No Transportation Needs (08/28/2021)   PRAPARE - Hydrologist (Medical): No    Lack of Transportation (Non-Medical): No  Physical Activity: Insufficiently Active (08/28/2021)   Exercise Vital Sign    Days of Exercise per Week: 4 days    Minutes of Exercise per Session: 30 min  Stress: No Stress Concern Present (08/28/2021)   [place]    Feeling of Stress : Only a little  Social Connections: Moderately Integrated (08/28/2021)   Social Connection and Isolation Panel [NHANES]    Frequency of Communication with Friends and Family: More than three times a week    Frequency of Social Gatherings with Friends and Family: Once a week    Attends Religious Services: More than 4 times per year    Active Member of [employer] or Organizations: No    Attends Archivist Meetings: Never    Marital Status: Married  Human resources officer Violence: Not At Risk (08/28/2021)   Humiliation, Afraid, Rape, and Kick questionnaire    Fear of Current or Ex-Partner: No    Emotionally Abused: No    Physically Abused: No    Sexually Abused: No   Family  History  Problem Relation Age of Onset   Hypertension Mother    Gout Mother    Diabetes Father    Hypertension Father    Stroke Father    Hypertension Maternal Aunt    Cancer Maternal Grandmother        breast   Hypertension Maternal Grandfather    Heart attack Maternal Grandfather    Stroke Paternal Grandmother    Diabetes Paternal Grandfather    Cancer Other    Hypertension Other    Diabetes Other    Past Surgical History:  Procedure Laterality Date   DILATION AND CURETTAGE OF  UTERUS     WISDOM TOOTH EXTRACTION       Vanessa Kick, MD 04/07/22 1017

## 2022-04-09 ENCOUNTER — Ambulatory Visit: Payer: 59 | Admitting: Obstetrics & Gynecology

## 2022-04-16 ENCOUNTER — Ambulatory Visit: Payer: 59 | Admitting: Obstetrics & Gynecology

## 2022-04-30 ENCOUNTER — Encounter: Payer: Self-pay | Admitting: Obstetrics & Gynecology

## 2022-04-30 ENCOUNTER — Ambulatory Visit (INDEPENDENT_AMBULATORY_CARE_PROVIDER_SITE_OTHER): Payer: Self-pay | Admitting: Obstetrics & Gynecology

## 2022-04-30 VITALS — BP 191/109 | HR 86 | Ht 64.0 in

## 2022-04-30 DIAGNOSIS — N96 Recurrent pregnancy loss: Secondary | ICD-10-CM

## 2022-04-30 DIAGNOSIS — E109 Type 1 diabetes mellitus without complications: Secondary | ICD-10-CM

## 2022-04-30 MED ORDER — PROGESTERONE 200 MG PO CAPS: 200.0000 mg | ORAL_CAPSULE | Freq: Every day | ORAL | 11 refills | Status: DC

## 2022-04-30 MED ORDER — ENOXAPARIN SODIUM 40 MG/0.4ML IJ SOSY: 40.0000 mg | PREFILLED_SYRINGE | INTRAMUSCULAR | 11 refills | Status: DC

## 2022-04-30 NOTE — Progress Notes (Signed)
Chief Complaint  Patient presents with   Follow-up      32 y.o. [id number] Patient's last menstrual period was 04/01/2022 (exact date). The current method of family planning is OCP (estrogen/progesterone).  Outpatient Encounter Medications as of 04/30/2022  Medication Sig   desogestrel-ethinyl estradiol (APRI) 0.15-30 MG-MCG tablet Take 1 tablet by mouth daily.   enoxaparin (LOVENOX) 40 MG/0.4ML injection Inject 0.4 mLs (40 mg total) into the skin daily.   insulin degludec (TRESIBA FLEXTOUCH) 100 UNIT/ML FlexTouch Pen Inject 30 Units into the skin daily.   losartan (COZAAR) 25 MG tablet Take 1 tablet (25 mg total) by mouth daily.   metFORMIN (GLUCOPHAGE-XR) 500 MG 24 hr tablet Take 2 tablets (1,000 mg total) by mouth in the morning and at bedtime.   progesterone (PROMETRIUM) 200 MG capsule Place 1 capsule (200 mg total) vaginally daily.   Tart Cherry 1200 MG CAPS Take by mouth.   [DISCONTINUED] SEMGLEE, YFGN, 100 UNIT/ML Pen INJECT 30 UNITS SUBCUTANEOUSLY ONCE DAILY   [DISCONTINUED] tiZANidine (ZANAFLEX) 4 MG capsule Take 1 capsule (4 mg total) by mouth 3 (three) times daily as needed for muscle spasms. Do not drink alcohol or drive while taking this medication.  May cause drowsiness.   colchicine 0.6 MG tablet Take two tablets as one dose followed one hour later by one tablet. (Patient not taking: Reported on 04/30/2022)   Heparin Sodium, Porcine, 5000 UNIT/0.5ML SOSY Inject 5,000 Units as directed 2 (two) times daily. (Patient not taking: Reported on 04/30/2022)   indomethacin (INDOCIN) 50 MG capsule Take 1 capsule (50 mg total) by mouth 3 (three) times daily with meals. (Patient not taking: Reported on 04/30/2022)   [DISCONTINUED] aspirin 81 MG chewable tablet Chew by mouth daily. (Patient not taking: Reported on 02/03/2022)   [DISCONTINUED] cyclobenzaprine (FLEXERIL) 10 MG tablet Take 1 tablet (10 mg total) by mouth 2 (two) times daily as needed for muscle spasms. (Patient not taking:  Reported on 02/03/2022)   [DISCONTINUED] diphenhydrAMINE (BENADRYL) 25 mg capsule Take 25 mg by mouth every 6 (six) hours as needed for allergies (and allergic reactions).    [DISCONTINUED] folic acid (FOLVITE) 1 MG tablet Take 1 mg by mouth daily.   [DISCONTINUED] insulin aspart (NOVOLOG) 100 UNIT/ML injection Inject 2 Units into the skin 3 (three) times daily before meals.   [DISCONTINUED] Insulin Pen Needle 31G X 5 MM MISC 1 Device by Does not apply route daily.   [DISCONTINUED] Insulin Syringe-Needle U-100 (GLOBAL INSULIN SYRINGES) 30G X 5/16" 0.3 ML MISC 1 Syringe by Does not apply route 3 (three) times daily.   [DISCONTINUED] iron polysaccharides (NIFEREX) 150 MG capsule Take 1 capsule (150 mg total) by mouth daily. (Patient not taking: Reported on 02/03/2022)   [DISCONTINUED] naproxen (NAPROSYN) 375 MG tablet Take 1 tablet (375 mg total) by mouth 2 (two) times daily as needed.   [DISCONTINUED] NIFEdipine (PROCARDIA XL) 30 MG 24 hr tablet Take 1 tablet (30 mg total) by mouth daily. (Patient not taking: Reported on 02/03/2022)   [DISCONTINUED] Prenatal MV & Min w/FA-DHA (PRENATAL GUMMIES PO) Take by mouth. (Patient not taking: Reported on 02/03/2022)   No facility-administered encounter medications on file as of 04/30/2022.    Subjective Pt with recurrent pregnancy losses, Type 1 diabetic recnet A1C 6.5 BP generally well controlled at home, states it goes up here, on procardia xl 30 with BP 130s/80s   Past Medical History:  Diagnosis Date   Abnormal uterine bleeding (AUB) 07/09/2019   Anemia  Anxiety    Bicornate uterus    Depression    seeing a therapist since 3rd miscarriage, helping, doing good.   Diabetes mellitus without complication    Type 2   Gout    History of miscarriage    Hypertension    Ovarian cyst     Past Surgical History:  Procedure Laterality Date   DILATION AND CURETTAGE OF UTERUS     WISDOM TOOTH EXTRACTION      OB History     Gravida  7   Para  1    Term      Preterm  1   AB  5   Living  0      SAB  5   IAB      Ectopic      Multiple  0   Live Births              Allergies  Allergen Reactions   Apple Fruit Extract Hives   Banana Hives   Other Hives    Walnuts    Shellfish Allergy Hives   Tomato Hives   Watermelon [Citrullus Vulgaris] Hives   Bactrim [Sulfamethoxazole-Trimethoprim] Hives and Rash   Sulfa Antibiotics Hives and Rash    Social History   Socioeconomic History   Marital status: Married    Spouse name: Not on file   Number of children: Not on file   Years of education: Not on file   Highest education level: Not on file  Occupational History   Not on file  Tobacco Use   Smoking status: Former   Smokeless tobacco: Never   Tobacco comments:    quit 2014  Vaping Use   Vaping Use: Never used  Substance and Sexual Activity   Alcohol use: No    Comment: occas   Drug use: Never   Sexual activity: Yes    Birth control/protection: None  Other Topics Concern   Not on file  Social History Narrative   Not on file   Social Determinants of Health   Financial Resource Strain: Low Risk  (08/28/2021)   Overall Financial Resource Strain (CARDIA)    Difficulty of Paying Living Expenses: Not hard at all  Food Insecurity: No Food Insecurity (08/28/2021)   Hunger Vital Sign    Worried About Running Out of Food in the Last Year: Never true    Ran Out of Food in the Last Year: Never true  Transportation Needs: No Transportation Needs (08/28/2021)   PRAPARE - Administrator, Civil ServiceTransportation    Lack of Transportation (Medical): No    Lack of Transportation (Non-Medical): No  Physical Activity: Insufficiently Active (08/28/2021)   Exercise Vital Sign    Days of Exercise per Week: 4 days    Minutes of Exercise per Session: 30 min  Stress: No Stress Concern Present (08/28/2021)   [employer] - Occupational Stress Questionnaire    Feeling of Stress : Only a little  Social Connections:  Moderately Integrated (08/28/2021)   Social Connection and Isolation Panel [NHANES]    Frequency of Communication with Friends and Family: More than three times a week    Frequency of Social Gatherings with Friends and Family: Once a week    Attends Religious Services: More than 4 times per year    Active Member of [employer] or Organizations: No    Attends Banker[employer] or Organization Meetings: Never    Marital Status: Married    Family History  Problem Relation Age of Onset  Hypertension Mother    Gout Mother    Diabetes Father    Hypertension Father    Stroke Father    Hypertension Maternal Aunt    Cancer Maternal Grandmother        breast   Hypertension Maternal Grandfather    Heart attack Maternal Grandfather    Stroke Paternal Grandmother    Diabetes Paternal Grandfather    Cancer Other    Hypertension Other    Diabetes Other     Medications:       Current Outpatient Medications:    desogestrel-ethinyl estradiol (APRI) 0.15-30 MG-MCG tablet, Take 1 tablet by mouth daily., Disp: 28 tablet, Rfl: 11   enoxaparin (LOVENOX) 40 MG/0.4ML injection, Inject 0.4 mLs (40 mg total) into the skin daily., Disp: 30 mL, Rfl: 11   insulin degludec (TRESIBA FLEXTOUCH) 100 UNIT/ML FlexTouch Pen, Inject 30 Units into the skin daily., Disp: 30 mL, Rfl: 3   losartan (COZAAR) 25 MG tablet, Take 1 tablet (25 mg total) by mouth daily., Disp: 90 tablet, Rfl: 2   metFORMIN (GLUCOPHAGE-XR) 500 MG 24 hr tablet, Take 2 tablets (1,000 mg total) by mouth in the morning and at bedtime., Disp: 360 tablet, Rfl: 3   progesterone (PROMETRIUM) 200 MG capsule, Place 1 capsule (200 mg total) vaginally daily., Disp: 30 capsule, Rfl: 11   Tart Cherry 1200 MG CAPS, Take by mouth., Disp: , Rfl:    colchicine 0.6 MG tablet, Take two tablets as one dose followed one hour later by one tablet. (Patient not taking: Reported on 04/30/2022), Disp: 3 tablet, Rfl: 0   Heparin Sodium, Porcine, 5000 UNIT/0.5ML SOSY, Inject 5,000 Units as  directed 2 (two) times daily. (Patient not taking: Reported on 04/30/2022), Disp: 30 mL, Rfl: 6   indomethacin (INDOCIN) 50 MG capsule, Take 1 capsule (50 mg total) by mouth 3 (three) times daily with meals. (Patient not taking: Reported on 04/30/2022), Disp: 15 capsule, Rfl: 0  Objective Blood pressure (!) 191/109, pulse 86, height 5\' 4"  (1.626 m), last menstrual period 04/01/2022.  Gen WDWN NAD  Pertinent ROS   Labs or studies reviewed    Impression + Management Plan: Diagnoses this Encounter::   ICD-10-CM   1. Recurrent pregnancy loss  N96    will use empiric prometrium 200 pv + lovenox 40 as soon as +pregnancy test    2. Type 1 diabetes mellitus not at goal  E10.9 HgB A1c        Medications prescribed during  this encounter: Meds ordered this encounter  Medications   progesterone (PROMETRIUM) 200 MG capsule    Sig: Place 1 capsule (200 mg total) vaginally daily.    Dispense:  30 capsule    Refill:  11   enoxaparin (LOVENOX) 40 MG/0.4ML injection    Sig: Inject 0.4 mLs (40 mg total) into the skin daily.    Dispense:  30 mL    Refill:  11    Labs or Scans Ordered during this encounter: Orders Placed This Encounter  Procedures   HgB A1c      Follow up Return if symptoms worsen or fail to improve.

## 2022-05-04 ENCOUNTER — Other Ambulatory Visit: Payer: Self-pay

## 2022-05-04 ENCOUNTER — Telehealth: Payer: Self-pay

## 2022-05-04 ENCOUNTER — Encounter (HOSPITAL_COMMUNITY): Payer: Self-pay | Admitting: Emergency Medicine

## 2022-05-04 ENCOUNTER — Emergency Department (HOSPITAL_COMMUNITY)
Admission: EM | Admit: 2022-05-04 | Discharge: 2022-05-04 | Disposition: A | Discharge status: Home / Self Care | Payer: Self-pay | Attending: Emergency Medicine | Admitting: Emergency Medicine

## 2022-05-04 ENCOUNTER — Emergency Department (HOSPITAL_COMMUNITY): Payer: Self-pay

## 2022-05-04 ENCOUNTER — Encounter: Payer: Self-pay | Admitting: Internal Medicine

## 2022-05-04 DIAGNOSIS — R519 Headache, unspecified: Secondary | ICD-10-CM | POA: Insufficient documentation

## 2022-05-04 DIAGNOSIS — Z87891 Personal history of nicotine dependence: Secondary | ICD-10-CM | POA: Insufficient documentation

## 2022-05-04 DIAGNOSIS — Z794 Long term (current) use of insulin: Secondary | ICD-10-CM | POA: Insufficient documentation

## 2022-05-04 DIAGNOSIS — E119 Type 2 diabetes mellitus without complications: Secondary | ICD-10-CM | POA: Insufficient documentation

## 2022-05-04 DIAGNOSIS — Z7984 Long term (current) use of oral hypoglycemic drugs: Secondary | ICD-10-CM | POA: Insufficient documentation

## 2022-05-04 DIAGNOSIS — R030 Elevated blood-pressure reading, without diagnosis of hypertension: Secondary | ICD-10-CM

## 2022-05-04 DIAGNOSIS — I10 Essential (primary) hypertension: Secondary | ICD-10-CM | POA: Insufficient documentation

## 2022-05-04 DIAGNOSIS — R0789 Other chest pain: Secondary | ICD-10-CM | POA: Insufficient documentation

## 2022-05-04 LAB — COMPREHENSIVE METABOLIC PANEL
ALT: 28 U/L (ref 0–44)
AST: 30 U/L (ref 15–41)
Albumin: 2.8 g/dL — ABNORMAL LOW (ref 3.5–5.0)
Alkaline Phosphatase: 121 U/L (ref 38–126)
Anion gap: 6 (ref 5–15)
BUN: 11 mg/dL (ref 6–20)
CO2: 22 mmol/L (ref 22–32)
Calcium: 8.6 mg/dL — ABNORMAL LOW (ref 8.9–10.3)
Chloride: 107 mmol/L (ref 98–111)
Creatinine, Ser: 0.95 mg/dL (ref 0.44–1.00)
GFR, Estimated: >60 mL/min (ref >60)
Glucose, Bld: 125 mg/dL — ABNORMAL HIGH (ref 70–99)
Potassium: 4.2 mmol/L (ref 3.5–5.1)
Sodium: 135 mmol/L (ref 135–145)
Total Bilirubin: 0.5 mg/dL (ref 0.3–1.2)
Total Protein: 7.3 g/dL (ref 6.5–8.1)

## 2022-05-04 LAB — CBC WITH DIFFERENTIAL/PLATELET
Abs Immature Granulocytes: 0.01 10*3/uL (ref 0.00–0.07)
Basophils Absolute: 0.1 10*3/uL (ref 0.0–0.1)
Basophils Relative: 1 %
Eosinophils Absolute: 0.1 10*3/uL (ref 0.0–0.5)
Eosinophils Relative: 2 %
HCT: 36.1 % (ref 36.0–46.0)
Hemoglobin: 11 g/dL — ABNORMAL LOW (ref 12.0–15.0)
Immature Granulocytes: 0 %
Lymphocytes Relative: 37 %
Lymphs Abs: 3 10*3/uL (ref 0.7–4.0)
MCH: 23.9 pg — ABNORMAL LOW (ref 26.0–34.0)
MCHC: 30.5 g/dL (ref 30.0–36.0)
MCV: 78.5 fL — ABNORMAL LOW (ref 80.0–100.0)
Monocytes Absolute: 0.6 10*3/uL (ref 0.1–1.0)
Monocytes Relative: 8 %
Neutro Abs: 4.2 10*3/uL (ref 1.7–7.7)
Neutrophils Relative %: 52 %
Platelets: 397 10*3/uL (ref 150–400)
RBC: 4.6 MIL/uL (ref 3.87–5.11)
RDW: 17 % — ABNORMAL HIGH (ref 11.5–15.5)
WBC: 8 10*3/uL (ref 4.0–10.5)
nRBC: 0 % (ref 0.0–0.2)

## 2022-05-04 LAB — TROPONIN I (HIGH SENSITIVITY)
Troponin I (High Sensitivity): 4 ng/L (ref <18)
Troponin I (High Sensitivity): 4 ng/L (ref <18)

## 2022-05-04 LAB — LIPASE, BLOOD: Lipase: 42 U/L (ref 11–51)

## 2022-05-04 LAB — POC URINE PREG, ED: Preg Test, Ur: NEGATIVE

## 2022-05-04 MED ORDER — ACETAMINOPHEN 500 MG PO TABS
1000.0000 mg | ORAL_TABLET | Freq: Once | ORAL | Status: AC
  Administered 2022-05-04: 1000 mg via ORAL
  Filled 2022-05-04: qty 2

## 2022-05-04 MED ORDER — SODIUM CHLORIDE 0.9 % IV SOLN
25.0000 mg | Freq: Once | INTRAVENOUS | Status: AC
  Administered 2022-05-04: 25 mg via INTRAVENOUS
  Filled 2022-05-04: qty 1

## 2022-05-04 MED ORDER — DIPHENHYDRAMINE HCL 50 MG/ML IJ SOLN
25.0000 mg | Freq: Once | INTRAMUSCULAR | Status: AC
  Administered 2022-05-04: 25 mg via INTRAVENOUS
  Filled 2022-05-04: qty 1

## 2022-05-04 MED ORDER — PROMETHAZINE HCL 12.5 MG PO TABS: 12.5000 mg | ORAL_TABLET | Freq: Three times a day (TID) | ORAL | 0 refills | Status: DC | PRN

## 2022-05-04 MED ORDER — SODIUM CHLORIDE 0.9 % IV BOLUS
1000.0000 mL | Freq: Once | INTRAVENOUS | Status: AC
  Administered 2022-05-04: 1000 mL via INTRAVENOUS

## 2022-05-04 MED ORDER — MAGNESIUM SULFATE 2 GM/50ML IV SOLN
2.0000 g | Freq: Once | INTRAVENOUS | Status: AC
  Administered 2022-05-04: 2 g via INTRAVENOUS
  Filled 2022-05-04: qty 50

## 2022-05-04 MED ORDER — LOSARTAN POTASSIUM 50 MG PO TABS: 50.0000 mg | ORAL_TABLET | Freq: Every day | ORAL | 3 refills | Status: DC

## 2022-05-04 NOTE — Telephone Encounter (Signed)
Patient has been advised and verbalized understanding and will go to ED for work up.

## 2022-05-04 NOTE — ED Provider Notes (Signed)
[place] EMERGENC[facility] DEPARTMENT AT [facility] Provider Note  CSN: [id number] Arrival date & time: 05/04/22 1015  Chief Complaint(s) Chest Pain and Headache  HPI Nina Phillips is a 32 y.o. female with past medical history as below, significant for hypertension, type II DM, anxiety, obesity who presents to the ED with complaint of + blood pressure, headache, chest pain.  She reports that she woke up this morning, felt as though she was starting to develop a headache.  When she got to work headache gradually worsened.  She checked her blood pressure and it was elevated.  She continued working and then began to develop chest tightness, pressure sensation.  Headache has persisted, she checked her blood pressure again and it was elevated still.  She called PCP for advice and was advised to increase her antihypertensive and proceed to the ER for evaluation.  On my assessment patient still has headache, throbbing, pulsing sensation.  Chest pressure/tightness was also reported.  Mild nausea without vomiting.  No diplopia, no numbness or tingling seems new.  No recent falls or head injuries, no fevers.  With home medications.  Past Medical History Past Medical History:  Diagnosis Date  . Abnormal uterine bleeding (AUB) 07/09/2019  . Anemia   . Anxiety   . Bicornate uterus   . Depression    seeing a therapist since 3rd miscarriage, helping, doing good.  . Diabetes mellitus without complication    Type 2  . Gout   . History of miscarriage   . Hypertension   . Ovarian cyst    Patient Active Problem List   Diagnosis Date Noted  . Supervision of high-risk pregnancy 03/19/2021  . Morbid obesity 10/02/2020  . Vitamin D deficiency 10/02/2020  . IUFD at 20 weeks or more of gestation 11/10/2019  . BMI 40.0-44.9, adult 08/27/2019  . Obesity in pregnancy 08/27/2019  . Chronic hypertension during pregnancy, antepartum 08/27/2019  . Anticoagulant long-term use 08/27/2019  . Type 2 diabetes  mellitus with microalbuminuria, with long-term current use of insulin 06/26/2019  . 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 Authorizing Provider  promethazine (PHENERGAN) 12.5 MG tablet Take 1 tablet (12.5 mg total) by mouth every 8 (eight) hours as needed for up to 10 days for nausea or vomiting. 05/04/22 05/14/22 Yes Tanda Rockers A, DO  colchicine 0.6 MG tablet Take two tablets as one dose followed one hour later by one tablet. Patient not taking: Reported on 04/30/2022 04/05/22   Mardella Layman, MD  desogestrel-ethinyl estradiol (APRI) 0.15-30 MG-MCG tablet Take 1 tablet by mouth daily. 10/15/21   Lazaro Arms, MD  enoxaparin (LOVENOX) 40 MG/0.4ML injection Inject 0.4 mLs (40 mg total) into the skin daily. 04/30/22   Lazaro Arms, MD  Heparin Sodium, Porcine, 5000 UNIT/0.5ML SOSY Inject 5,000 Units as directed 2 (two) times daily. Patient not taking: Reported on 04/30/2022 07/10/21   Lazaro Arms, MD  indomethacin (INDOCIN) 50 MG capsule Take 1 capsule (50 mg total) by mouth 3 (three) times daily with meals. Patient not taking: Reported on 04/30/2022 04/05/22   Mardella Layman, MD  insulin degludec (TRESIBA FLEXTOUCH) 100 UNIT/ML FlexTouch Pen Inject 30 Units into the skin daily. 02/05/22   Shamleffer, Konrad Dolores, MD  losartan (COZAAR) 50 MG tablet Take 1 tablet (50 mg total) by mouth daily. 05/04/22   Shamleffer, Konrad Dolores, MD  metFORMIN (GLUCOPHAGE-XR) 500  MG 24 hr tablet Take 2 tablets (1,000 mg total) by mouth in the morning and at bedtime. 02/03/22   Shamleffer, Konrad Dolores, MD  progesterone (PROMETRIUM) 200 MG capsule Place 1 capsule (200 mg total) vaginally daily. 04/30/22   Lazaro Arms, MD  Tart Cherry 1200 MG CAPS Take by mouth.    [provider]  diphenhydrAMINE (BENADRYL) 25 mg capsule Take 25 mg by mouth every 6 (six) hours as needed for  allergies (and allergic reactions).   12/20/18  [provider]                                                                                                                                    Past Surgical History Past Surgical History:  Procedure Laterality Date  . DILATION AND CURETTAGE OF UTERUS    . WISDOM TOOTH EXTRACTION     Family History Family History  Problem Relation Age of Onset  . Hypertension Mother   . Gout Mother   . Diabetes Father   . Hypertension Father   . Stroke Father   . Hypertension Maternal Aunt   . Cancer Maternal Grandmother        breast  . Hypertension Maternal Grandfather   . Heart attack Maternal Grandfather   . Stroke Paternal Grandmother   . Diabetes Paternal Grandfather   . Cancer Other   . Hypertension Other   . Diabetes Other     Social History Social History   Tobacco Use  . Smoking status: Former  . Smokeless tobacco: Never  . Tobacco comments:    quit 2014  Vaping Use  . Vaping Use: Never used  Substance Use Topics  . Alcohol use: No    Comment: occas  . Drug use: Never   Allergies Apple fruit extract, Banana, Other, Shellfish allergy, Tomato, Watermelon [citrullus vulgaris], Bactrim [sulfamethoxazole-trimethoprim], and Sulfa antibiotics  Review of Systems Review of Systems  Constitutional:  Negative for activity change and fever.  HENT:  Negative for facial swelling and trouble swallowing.   Eyes:  Negative for discharge and redness.  Respiratory:  Positive for chest tightness and shortness of breath. Negative for cough.   Cardiovascular:  Positive for chest pain. Negative for palpitations.  Gastrointestinal:  Positive for nausea. Negative for abdominal pain and vomiting.  Genitourinary:  Negative for dysuria and flank pain.  Musculoskeletal:  Negative for back pain and gait problem.  Skin:  Negative for pallor and rash.  Neurological:  Positive for headaches. Negative for syncope.    Physical Exam Vital  Signs  I have reviewed the triage vital signs BP (!) 165/86   Pulse (!) 106   Temp 98.2 F (36.8 C) (Oral)   Resp 19   Ht 5\' 4"  (1.626 m)   Wt 112.5 kg   LMP 05/04/2022 (Exact Date)   SpO2 100%   BMI 42.57 kg/m  Physical Exam Vitals and nursing note reviewed.  Constitutional:      General: She is not in acute distress.    Appearance: Normal appearance. She is well-developed. She is not ill-appearing.  HENT:     Head: Normocephalic and atraumatic.     Right Ear: External ear normal.     Left Ear: External ear normal.     Nose: Nose normal.     Mouth/Throat:     Mouth: Mucous membranes are moist.  Eyes:     General: No scleral icterus.       Right eye: No discharge.        Left eye: No discharge.     Extraocular Movements: Extraocular movements intact.     Pupils: Pupils are equal, round, and reactive to light.  Cardiovascular:     Rate and Rhythm: Normal rate and regular rhythm.     Pulses: Normal pulses.     Heart sounds: Normal heart sounds.  Pulmonary:     Effort: Pulmonary effort is normal. No respiratory distress.     Breath sounds: Normal breath sounds.  Abdominal:     General: Abdomen is flat.     Palpations: Abdomen is soft.     Tenderness: There is no abdominal tenderness.  Musculoskeletal:     Cervical back: No rigidity.     Right lower leg: No edema.     Left lower leg: No edema.  Skin:    General: Skin is warm and dry.     Capillary Refill: Capillary refill takes less than 2 seconds.  Neurological:     Mental Status: She is alert and oriented to person, place, and time.     GCS: GCS eye subscore is 4. GCS verbal subscore is 5. GCS motor subscore is 6.     Cranial Nerves: Cranial nerves 2-12 are intact.     Sensory: Sensation is intact.     Motor: Motor function is intact. No tremor or pronator drift.     Coordination: Coordination is intact.     Comments: Strength 5/5 bilateral upper and lower extremities  Psychiatric:        Mood and Affect: Mood  normal.        Behavior: Behavior normal.     ED Results and Treatments Labs (all labs ordered are listed, but only abnormal results are displayed) Labs Reviewed  COMPREHENSIVE METABOLIC PANEL - Abnormal; Notable for the following components:      Result Value   Glucose, Bld 125 (*)    Calcium 8.6 (*)    Albumin 2.8 (*)    All other components within normal limits  CBC WITH DIFFERENTIAL/PLATELET - Abnormal; Notable for the following components:   Hemoglobin 11.0 (*)    MCV 78.5 (*)    MCH 23.9 (*)    RDW 17.0 (*)    All other components within normal limits  LIPASE, BLOOD  POC URINE PREG, ED  TROPONIN I (HIGH SENSITIVITY)  TROPONIN I (HIGH SENSITIVITY)  Radiology DG Chest 2 View  Result Date: 05/04/2022 CLINICAL DATA:  Chest pain and high blood pressure today. Short of breath. EXAM: CHEST - 2 VIEW COMPARISON:  07/20/2021.  CT, 08/26/2020. FINDINGS: Normal heart, mediastinum and hila. Clear lungs.  No pleural effusion or pneumothorax. Skeletal structures are within normal limits. IMPRESSION: Normal chest radiographs. Electronically Signed   By: Amie Portland M.D.   On: 05/04/2022 10:55    Pertinent labs & imaging results that were available during my care of the patient were reviewed by me and considered in my medical decision making (see MDM for details).  Medications Ordered in ED Medications  promethazine (PHENERGAN) 25 mg in sodium chloride 0.9 % 50 mL IVPB (0 mg Intravenous Stopped 05/04/22 1324)  diphenhydrAMINE (BENADRYL) injection 25 mg (25 mg Intravenous Given 05/04/22 1216)  sodium chloride 0.9 % bolus 1,000 mL (1,000 mLs Intravenous Bolus 05/04/22 1216)  magnesium sulfate IVPB 2 g 50 mL (0 g Intravenous Stopped 05/04/22 1324)  acetaminophen (TYLENOL) tablet 1,000 mg (1,000 mg Oral Given 05/04/22 1220)                                                                                                                                      Procedures Procedures  (including critical care time)  Medical Decision Making / ED Course    Medical Decision Making:    Nina Phillips is a 32 y.o. female  with past medical history as below, significant for hypertension, type II DM, anxiety, obesity who presents to the ED with complaint of + blood pressure, headache, chest pain.. The complaint involves an extensive differential diagnosis and also carries with it a high risk of complications and morbidity.  Serious etiology was considered. Ddx includes but is not limited to: Differential diagnoses for head trauma includes subdural hematoma, epidural hematoma, acute concussion, traumatic subarachnoid hemorrhage, cerebral contusions, etc. Differential includes all life-threatening causes for chest pain. This includes but is not exclusive to acute coronary syndrome, aortic dissection, pulmonary embolism, cardiac tamponade, community-acquired pneumonia, pericarditis, musculoskeletal chest wall pain, etc.   Complete initial physical exam performed, notably the patient  was she is resting comfortably in no acute distress, blood pressure is elevated.  Neuroexam is nonfocal.  Will give migraine cocktail, check screening labs and imaging.    Reviewed and confirmed nursing documentation for past medical history, family history, social history.  Vital signs reviewed.    Clinical Course as of 05/04/22 1425  Tue May 04, 2022  1141 EKG and chest x-ray were stable [SG]  1238 Troponin I (High Sensitivity): 4 [SG]  1313 Pt feeling better on recheck, symptoms improved overall.  [SG]  1416 Symptoms resolved, no cp or headache at this time. Her losartan was increased by PCP earlier today. Low suspicion for hypertensive emergency, no end organ damage noted on labs. Trop neg and EKG is wnl. Well's score is low, no ongoing dyspnea. Not hypoxic.  Suspicion for PE is low.  [SG]  1424 ECG  Heart Rate(!): 108 HR elevated while pt was moving (on pulse ox with poor waveform), quickly returned to 80's, no palpitations  [SG]    Clinical Course User Index [SG] Sloan LeiterGray, Jenean Escandon A, DO   The patient's chest pain is not suggestive of pulmonary embolus, cardiac ischemia, aortic dissection, pericarditis, myocarditis, pulmonary embolism, pneumothorax, pneumonia, Zoster, or esophageal perforation, or other serious etiology.  Historically not abrupt in onset, tearing or ripping, pulses symmetric. EKG nonspecific for ischemia/infarction. No dysrhythmias, brugada, WPW, prolonged QT noted.   Troponin negative x2. CXR reviewed. Labs without demonstration of acute pathology unless otherwise noted above. Low HEART Score: 0-3 points (0.9-1.7% risk of MACE).  Given the extremely low risk of these diagnoses further testing and evaluation for these possibilities does not appear to be indicated at this time. Patient in no distress and overall condition improved here in the ED. Detailed discussions were had with the patient regarding current findings, and need for close f/u with PCP or on call doctor. The patient has been instructed to return immediately if the symptoms worsen in any way for re-evaluation. Patient verbalized understanding and is in agreement with current care plan. All questions answered prior to discharge.    Additional history obtained: -Additional history obtained from na -External records from outside source obtained and reviewed including: Chart review including previous notes, labs, imaging, consultation notes including home medications, primary care recommendation, prior labs and imaging   Lab Tests: -I ordered, reviewed, and interpreted labs.   The pertinent results include:   Labs Reviewed  COMPREHENSIVE METABOLIC PANEL - Abnormal; Notable for the following components:      Result Value   Glucose, Bld 125 (*)    Calcium 8.6 (*)    Albumin 2.8 (*)    All other components within  normal limits  CBC WITH DIFFERENTIAL/PLATELET - Abnormal; Notable for the following components:   Hemoglobin 11.0 (*)    MCV 78.5 (*)    MCH 23.9 (*)    RDW 17.0 (*)    All other components within normal limits  LIPASE, BLOOD  POC URINE PREG, ED  TROPONIN I (HIGH SENSITIVITY)  TROPONIN I (HIGH SENSITIVITY)    Notable for trop neg  EKG   EKG Interpretation  Date/Time:  Tuesday May 04 2022 10:40:52 EDT Ventricular Rate:  82 PR Interval:  112 QRS Duration: 95 QT Interval:  363 QTC Calculation: 424 R Axis:   33 Text Interpretation: Sinus rhythm Borderline short PR interval no stemi similar to prior Confirmed by Tanda RockersGray, Swan Zayed ([id number]) on 05/04/2022 10:42:30 AM         Imaging Studies ordered: I ordered imaging studies including CXR I independently visualized the following imaging with scope of interpretation limited to determining acute life threatening conditions related to emergency care; findings noted above, significant for WNL I independently visualized and interpreted imaging. I agree with the radiologist interpretation   Medicines ordered and prescription drug management: Meds ordered this encounter  Medications  . promethazine (PHENERGAN) 25 mg in sodium chloride 0.9 % 50 mL IVPB  . diphenhydrAMINE (BENADRYL) injection 25 mg  . sodium chloride 0.9 % bolus 1,000 mL  . magnesium sulfate IVPB 2 g 50 mL  . acetaminophen (TYLENOL) tablet 1,000 mg  . promethazine (PHENERGAN) 12.5 MG tablet    Sig: Take 1 tablet (12.5 mg total) by mouth every 8 (eight) hours as needed for up to 10 days for nausea or vomiting.  Dispense:  10 tablet    Refill:  0    -I have reviewed the patients home medicines and have made adjustments as needed   Consultations Obtained: na   Cardiac Monitoring: The patient was maintained on a cardiac monitor.  I personally viewed and interpreted the cardiac monitored which showed an underlying rhythm of: NSR  Social Determinants of Health:   Diagnosis or treatment significantly limited by social determinants of health: former smoker and obesity   Reevaluation: After the interventions noted above, I reevaluated the patient and found that they have resolved  Co morbidities that complicate the patient evaluation . Past Medical History:  Diagnosis Date  . Abnormal uterine bleeding (AUB) 07/09/2019  . Anemia   . Anxiety   . Bicornate uterus   . Depression    seeing a therapist since 3rd miscarriage, helping, doing good.  . Diabetes mellitus without complication    Type 2  . Gout   . History of miscarriage   . Hypertension   . Ovarian cyst       Dispostion: Disposition decision including need for hospitalization was considered, and patient discharged from emergency department.    Final Clinical Impression(s) / ED Diagnoses Final diagnoses:  Elevated blood pressure reading  Atypical chest pain  Generalized headache     This chart was dictated using voice recognition software.  Despite best efforts to proofread,  errors can occur which can change the documentation meaning.    Tanda Rockers A, DO 05/04/22 1418

## 2022-05-04 NOTE — ED Triage Notes (Signed)
Pt at work and started having mid sternal cp, woke with headache this am and bp was high both times. Called pcp and was sent in new rx for bp meds and was told to come to ED. Denies radiation of chest pain. Is constant. Is a stabbing pain with a little bit of pressure. C/o " a little" sob. Denies n/v. Pt is non diaphoretic in triage. Nad.

## 2022-05-04 NOTE — Discharge Instructions (Addendum)
It was a pleasure caring for you today in the emergency department.  Please return to the emergency department for any worsening or worrisome symptoms.  Please take your increased dose of losartan starting today and follow-up with PCP in the next few days for recheck

## 2022-05-11 ENCOUNTER — Encounter: Payer: Self-pay | Admitting: Obstetrics & Gynecology

## 2022-05-11 LAB — HEMOGLOBIN A1C
Est. average glucose Bld gHb Est-mCnc: 134 mg/dL
Hgb A1c MFr Bld: 6.3 % — ABNORMAL HIGH (ref 4.8–5.6)

## 2022-05-25 ENCOUNTER — Telehealth: Payer: Self-pay

## 2022-05-25 NOTE — Telephone Encounter (Signed)
Patient is having severe nausea, believes she may be pregnant but doesn't want to take test til Friday which will be 2 days after her period is to start. She is wanting to see if she can get something for nausea. Please advise.

## 2022-05-31 ENCOUNTER — Encounter: Payer: Self-pay | Admitting: Obstetrics & Gynecology

## 2022-06-09 ENCOUNTER — Encounter: Payer: Self-pay | Admitting: Obstetrics & Gynecology

## 2022-06-11 ENCOUNTER — Other Ambulatory Visit: Payer: Self-pay

## 2022-06-21 DIAGNOSIS — Z5321 Procedure and treatment not carried out due to patient leaving prior to being seen by health care provider: Secondary | ICD-10-CM | POA: Insufficient documentation

## 2022-06-21 DIAGNOSIS — K0889 Other specified disorders of teeth and supporting structures: Secondary | ICD-10-CM | POA: Insufficient documentation

## 2022-06-22 ENCOUNTER — Emergency Department (HOSPITAL_COMMUNITY)
Admission: EM | Admit: 2022-06-22 | Discharge: 2022-06-22 | Disposition: A | Discharge status: Home / Self Care | Payer: Self-pay | Attending: Emergency Medicine | Admitting: Emergency Medicine

## 2022-06-22 ENCOUNTER — Other Ambulatory Visit: Payer: Self-pay

## 2022-06-22 ENCOUNTER — Encounter (HOSPITAL_COMMUNITY): Payer: Self-pay

## 2022-06-22 ENCOUNTER — Emergency Department (HOSPITAL_COMMUNITY)
Admission: EM | Admit: 2022-06-22 | Discharge: 2022-06-22 | Discharge status: Against Medical Advice | Payer: Self-pay | Attending: Emergency Medicine | Admitting: Emergency Medicine

## 2022-06-22 DIAGNOSIS — I10 Essential (primary) hypertension: Secondary | ICD-10-CM | POA: Insufficient documentation

## 2022-06-22 DIAGNOSIS — Z7984 Long term (current) use of oral hypoglycemic drugs: Secondary | ICD-10-CM | POA: Insufficient documentation

## 2022-06-22 DIAGNOSIS — E119 Type 2 diabetes mellitus without complications: Secondary | ICD-10-CM | POA: Insufficient documentation

## 2022-06-22 DIAGNOSIS — Z794 Long term (current) use of insulin: Secondary | ICD-10-CM | POA: Insufficient documentation

## 2022-06-22 DIAGNOSIS — K0889 Other specified disorders of teeth and supporting structures: Secondary | ICD-10-CM | POA: Insufficient documentation

## 2022-06-22 LAB — CBG MONITORING, ED: Glucose-Capillary: 112 mg/dL — ABNORMAL HIGH (ref 70–99)

## 2022-06-22 MED ORDER — OXYCODONE-ACETAMINOPHEN 5-325 MG PO TABS
1.0000 | ORAL_TABLET | Freq: Once | ORAL | Status: AC
  Administered 2022-06-22: 1 via ORAL
  Filled 2022-06-22: qty 1

## 2022-06-22 MED ORDER — KETOROLAC TROMETHAMINE 60 MG/2ML IM SOLN
30.0000 mg | Freq: Once | INTRAMUSCULAR | Status: AC
  Administered 2022-06-22: 30 mg via INTRAMUSCULAR
  Filled 2022-06-22: qty 2

## 2022-06-22 NOTE — ED Triage Notes (Signed)
Pt reports left lower mouth pain, is unsure of her filling in her lower back tooth as fallen out or of she has an infection. Has not been able to see dentist for problem

## 2022-06-22 NOTE — ED Triage Notes (Signed)
Pt c/o dental pain to bottom left that started yesterday. Pt has been taking Tylenol w/o relief.

## 2022-06-22 NOTE — ED Provider Notes (Signed)
[place] EMERGENCY DEPARTMENT AT [facility] Provider Note   CSN: [id number] Arrival date & time: 06/22/22  8841     History  Chief Complaint  Patient presents with   Dental Pain    Nina Phillips is a 32 y.o. female.   Dental Pain Patient presents for dental pain.  Medical history includes T2DM, gout, HTN, anxiety, anemia.  Patient had onset of tooth pain yesterday.  This is an area of left lower first molar.  She has had to call dentist.  She took Tylenol yesterday.  She has not taken anything today for analgesia.  Tooth is sensitive to pressure, heat, and cold.  She denies any other physical concerns.     Home Medications Prior to Admission medications   Medication Sig Start Date End Date Taking? Authorizing Provider  colchicine 0.6 MG tablet Take two tablets as one dose followed one hour later by one tablet. Patient not taking: Reported on 04/30/2022 04/05/22   Mardella Layman, MD  desogestrel-ethinyl estradiol (APRI) 0.15-30 MG-MCG tablet Take 1 tablet by mouth daily. 10/15/21   Lazaro Arms, MD  enoxaparin (LOVENOX) 40 MG/0.4ML injection Inject 0.4 mLs (40 mg total) into the skin daily. 04/30/22   Lazaro Arms, MD  Heparin Sodium, Porcine, 5000 UNIT/0.5ML SOSY Inject 5,000 Units as directed 2 (two) times daily. Patient not taking: Reported on 04/30/2022 07/10/21   Lazaro Arms, MD  indomethacin (INDOCIN) 50 MG capsule Take 1 capsule (50 mg total) by mouth 3 (three) times daily with meals. Patient not taking: Reported on 04/30/2022 04/05/22   Mardella Layman, MD  insulin degludec (TRESIBA FLEXTOUCH) 100 UNIT/ML FlexTouch Pen Inject 30 Units into the skin daily. 02/05/22   Shamleffer, Konrad Dolores, MD  losartan (COZAAR) 50 MG tablet Take 1 tablet (50 mg total) by mouth daily. 05/04/22   Shamleffer, Konrad Dolores, MD  metFORMIN (GLUCOPHAGE-XR) 500 MG 24 hr tablet Take 2 tablets (1,000 mg total) by mouth in the morning and at bedtime. 02/03/22   Shamleffer, Konrad Dolores,  MD  progesterone (PROMETRIUM) 200 MG capsule Place 1 capsule (200 mg total) vaginally daily. 04/30/22   Lazaro Arms, MD  promethazine (PHENERGAN) 12.5 MG tablet Take 1 tablet (12.5 mg total) by mouth every 8 (eight) hours as needed for up to 10 days for nausea or vomiting. 05/04/22 05/14/22  Sloan Leiter, DO  Tart Cherry 1200 MG CAPS Take by mouth.    [provider]  diphenhydrAMINE (BENADRYL) 25 mg capsule Take 25 mg by mouth every 6 (six) hours as needed for allergies (and allergic reactions).   12/20/18  [provider]      Allergies    Apple fruit extract, Banana, Other, Shellfish allergy, Tomato, Watermelon [citrullus vulgaris], Bactrim [sulfamethoxazole-trimethoprim], and Sulfa antibiotics    Review of Systems   Review of Systems  HENT:  Positive for dental problem.   All other systems reviewed and are negative.   Physical Exam Updated Vital Signs BP (!) 143/91 (BP Location: Right Arm)   Pulse 98   Temp 98.3 F (36.8 C) (Oral)   Resp 18   SpO2 100%  Physical Exam Vitals and nursing note reviewed.  Constitutional:      General: She is not in acute distress.    Appearance: Normal appearance. She is well-developed. She is not ill-appearing, toxic-appearing or diaphoretic.  HENT:     Head: Normocephalic and atraumatic.     Right Ear: External ear normal.     Left Ear:  External ear normal.     Nose: Nose normal.     Mouth/Throat:     Mouth: Mucous membranes are moist.     Pharynx: Oropharynx is clear.     Comments: Pressure sensitivity to tooth #19.  Filling is in place.  There is no associated area of swelling. Eyes:     Extraocular Movements: Extraocular movements intact.     Conjunctiva/sclera: Conjunctivae normal.  Cardiovascular:     Rate and Rhythm: Normal rate and regular rhythm.  Pulmonary:     Effort: Pulmonary effort is normal. No respiratory distress.  Abdominal:     General: There is no distension.     Palpations: Abdomen is soft.      Tenderness: There is no abdominal tenderness.  Musculoskeletal:        General: No swelling. Normal range of motion.     Cervical back: Normal range of motion and neck supple.     Right lower leg: No edema.     Left lower leg: No edema.  Skin:    General: Skin is warm and dry.     Coloration: Skin is not jaundiced or pale.  Neurological:     General: No focal deficit present.     Mental Status: She is alert and oriented to person, place, and time.  Psychiatric:        Mood and Affect: Mood normal.        Behavior: Behavior normal.     ED Results / Procedures / Treatments   Labs (all labs ordered are listed, but only abnormal results are displayed) Labs Reviewed  CBG MONITORING, ED - Abnormal; Notable for the following components:      Result Value   Glucose-Capillary 112 (*)    All other components within normal limits    EKG None  Radiology No results found.  Procedures Procedures    Medications Ordered in ED Medications  ketorolac (TORADOL) injection 30 mg (30 mg Intramuscular Given 06/22/22 0732)  oxyCODONE-acetaminophen (PERCOCET/ROXICET) 5-325 MG per tablet 1 tablet (1 tablet Oral Given 06/22/22 0732)    ED Course/ Medical Decision Making/ A&P                             Medical Decision Making Risk Prescription drug management.   Patient presents for dental pain since yesterday.  She has pressure sensitivity to tooth #19 with no evidence of associated abscess or swelling.  Toradol and Percocet were ordered for analgesia.  Improved pain.  Patient was provided with contact information to arrange for close dental follow-up.  She was discharged in stable condition.        Final Clinical Impression(s) / ED Diagnoses Final diagnoses:  Pain, dental    Rx / DC Orders ED Discharge Orders     None         Gloris Manchester, MD 06/22/22 [phone]

## 2022-06-22 NOTE — Discharge Instructions (Addendum)
You will need to see a dentist as soon as possible.  Take ibuprofen and Tylenol as needed for pain.

## 2022-06-30 ENCOUNTER — Encounter (INDEPENDENT_AMBULATORY_CARE_PROVIDER_SITE_OTHER): Payer: Self-pay | Admitting: Family Medicine

## 2022-06-30 DIAGNOSIS — Z0289 Encounter for other administrative examinations: Secondary | ICD-10-CM

## 2022-07-27 ENCOUNTER — Encounter: Payer: Self-pay | Admitting: Obstetrics & Gynecology

## 2022-08-31 ENCOUNTER — Ambulatory Visit (INDEPENDENT_AMBULATORY_CARE_PROVIDER_SITE_OTHER): Payer: Self-pay | Admitting: Family Medicine

## 2022-09-09 ENCOUNTER — Ambulatory Visit (INDEPENDENT_AMBULATORY_CARE_PROVIDER_SITE_OTHER): Payer: Self-pay | Admitting: Family Medicine

## 2022-09-13 ENCOUNTER — Ambulatory Visit: Payer: Self-pay | Admitting: Psychiatry

## 2022-09-14 ENCOUNTER — Ambulatory Visit (INDEPENDENT_AMBULATORY_CARE_PROVIDER_SITE_OTHER): Payer: Self-pay | Admitting: Family Medicine

## 2022-09-25 ENCOUNTER — Encounter: Payer: Self-pay | Admitting: Obstetrics & Gynecology

## 2022-09-29 ENCOUNTER — Ambulatory Visit: Payer: Self-pay | Admitting: Psychiatry

## 2022-09-30 ENCOUNTER — Telehealth: Payer: Self-pay

## 2022-09-30 ENCOUNTER — Encounter: Payer: Self-pay | Admitting: Internal Medicine

## 2022-09-30 NOTE — Telephone Encounter (Signed)
09/30/22  188 7:15  fasting     Took   2 units of Novolog 3 times a day  09/29/22  203 fasting   09/28/22 not fasting   7am 252   8am 257  10:02 235  Metformin 1 1/2 tab 3 times a day  Patient is using a old prescription that she had left over of the Novolog because she can't afford it and she has doubled up on the Metformin

## 2022-09-30 NOTE — Telephone Encounter (Signed)
Patient called back and I was able to provided information for BS. She doesn't have insurance at this time and will apply for the assistance and also pay cash for it right now. She will see if she can get the coupon at [facility] to get for $35

## 2022-10-04 ENCOUNTER — Encounter: Payer: Self-pay | Admitting: Internal Medicine

## 2022-10-04 ENCOUNTER — Other Ambulatory Visit: Payer: Self-pay

## 2022-10-04 MED ORDER — NOVOLOG FLEXPEN 100 UNIT/ML ~~LOC~~ SOPN: 6.0000 [IU] | PEN_INJECTOR | Freq: Three times a day (TID) | SUBCUTANEOUS | 2 refills | Status: DC

## 2022-10-13 ENCOUNTER — Ambulatory Visit (INDEPENDENT_AMBULATORY_CARE_PROVIDER_SITE_OTHER): Payer: Self-pay | Admitting: Family Medicine

## 2022-10-20 ENCOUNTER — Other Ambulatory Visit: Payer: Self-pay | Admitting: Obstetrics & Gynecology

## 2022-10-20 ENCOUNTER — Ambulatory Visit: Payer: Self-pay | Admitting: Psychiatry

## 2022-10-20 DIAGNOSIS — O3680X0 Pregnancy with inconclusive fetal viability, not applicable or unspecified: Secondary | ICD-10-CM

## 2022-10-21 ENCOUNTER — Ambulatory Visit (INDEPENDENT_AMBULATORY_CARE_PROVIDER_SITE_OTHER): Payer: Self-pay | Admitting: Obstetrics & Gynecology

## 2022-10-21 ENCOUNTER — Ambulatory Visit (INDEPENDENT_AMBULATORY_CARE_PROVIDER_SITE_OTHER): Payer: Self-pay

## 2022-10-21 VITALS — BP 143/90 | HR 85

## 2022-10-21 DIAGNOSIS — E109 Type 1 diabetes mellitus without complications: Secondary | ICD-10-CM

## 2022-10-21 DIAGNOSIS — N96 Recurrent pregnancy loss: Secondary | ICD-10-CM

## 2022-10-21 DIAGNOSIS — Z3A08 8 weeks gestation of pregnancy: Secondary | ICD-10-CM

## 2022-10-21 DIAGNOSIS — Z3490 Encounter for supervision of normal pregnancy, unspecified, unspecified trimester: Secondary | ICD-10-CM

## 2022-10-21 DIAGNOSIS — Q513 Bicornate uterus: Secondary | ICD-10-CM

## 2022-10-21 DIAGNOSIS — O3680X0 Pregnancy with inconclusive fetal viability, not applicable or unspecified: Secondary | ICD-10-CM

## 2022-10-21 NOTE — Progress Notes (Signed)
Follow up appointment for results: snongram  Chief Complaint  Patient presents with   Follow-up    [name] today    Blood pressure (!) 143/90, pulse 85, last menstrual period 08/26/2022.  US OB Comp Less 14 Wks  Result Date: 10/22/2022 Table formatting from the original result was not included. Images from the original result were not included.  ..an Financial trader of Ultrasound Medicine Technical sales engineer) accredited practice Center for [facility] [address] [place] [address] Ordering Provider: Lazaro Arms, MD                                                                                                                             DATING AND VIABILITY SONOGRAM Nina Phillips is a 32 y.o. year old G60P0150 Patient's last menstrual period was 08/26/2022. which would correlate to  [redacted]w[redacted]d weeks gestation.  She has regular menstrual cycles.   She is here today for a confirmatory initial sonogram. GESTATION: SINGLETON   FETAL ACTIVITY:          Heart rate         176 bpm          CERVIX: Appears closed ADNEXA: The ovaries are normal. GESTATIONAL AGE AND  BIOMETRICS: Gestational criteria: Estimated Date of Delivery: 06/02/23 by LMP now at 101w0d Previous Scans:0    CROWN RUMP LENGTH           16.1 mm         8 weeks                                                                      AVERAGE EGA(BY THIS SCAN):  8 weeks WORKING EDD( LMP ):  06-02-2023  TECHNICIAN COMMENTS: [name] = [redacted]w[redacted]d single viable IUP  CRL = 16.1 mm  bicornuate uterus with IUP seen in Rt horn  single normal ys Small intramural fibroid seen in left fundus = 15 x 9 mm Normal ovaries bilaterally - neg adnexal regions  P. Nance, RDMS A copy of this report including all images has been saved and backed up to a second source for retrieval if needed. All measures and details of the anatomical scan, placentation, fluid volume and pelvic anatomy are contained in that report. [name], RDMS 10/21/2022 5:09 PM Clinical Impression and  recommendations: I have reviewed the sonogram results above, combined with the patient's current clinical course, below are my impressions and any appropriate recommendations for management based on the sonographic findings. Viable early IUP G8P0150 Estimated Date of Delivery: 06/02/23 LMP, today's scan Bicornuate uterus, known Pregnancy in the right horn Normal general sonographic findings Recommend routine care unless otherwise clinically indicated Lazaro Arms 10/22/2022 9:20  AM     MEDS ordered this encounter: No orders of the defined types were placed in this encounter.   Orders for this encounter: No orders of the defined types were placed in this encounter.   Impression + Management Plan   ICD-10-CM   1. Early stage of pregnancy  Z34.90     2. Bicornuate uterus  Q51.3     3. 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 Type 1 diabetes mellitus not at goal [facility]), age 77  E10.9       Follow Up: Return in about 2 weeks (around 11/04/2022) for repeat sonogram + New ob visit.     All questions were answered.  Past Medical History:  Diagnosis Date   Abnormal uterine bleeding (AUB) 07/09/2019   Anemia    Anxiety    Bicornate uterus    Depression    seeing a therapist since 3rd miscarriage, helping, doing good.   Diabetes mellitus without complication (HCC)    Type 2   Gout    History of miscarriage    Hypertension    Ovarian cyst     Past Surgical History:  Procedure Laterality Date   DILATION AND CURETTAGE OF UTERUS     WISDOM TOOTH EXTRACTION      OB History     Gravida  8   Para  1   Term      Preterm  1   AB  5   Living  0      SAB  5   IAB      Ectopic      Multiple  0   Live Births              Allergies  Allergen Reactions   Apple Fruit Extract Hives   Banana Hives   Other Hives    Walnuts    Shellfish Allergy Hives   Tomato Hives   Watermelon [Citrullus Vulgaris] Hives   Bactrim  [Sulfamethoxazole-Trimethoprim] Hives and Rash   Sulfa Antibiotics Hives and Rash    Social History   Socioeconomic History   Marital status: Married    Spouse name: Not on file   Number of children: Not on file   Years of education: Not on file   Highest education level: Not on file  Occupational History   Not on file  Tobacco Use   Smoking status: Former   Smokeless tobacco: Never   Tobacco comments:    quit 2014  Vaping Use   Vaping status: Never Used  Substance and Sexual Activity   Alcohol use: No    Comment: occas   Drug use: Never   Sexual activity: Yes    Birth control/protection: Pill  Other Topics Concern   Not on file  Social History Narrative   Not on file   Social Determinants of Health   Financial Resource Strain: Low Risk  (08/28/2021)   Overall Financial Resource Strain (CARDIA)    Difficulty of Paying Living Expenses: Not hard at all  Food Insecurity: No Food Insecurity (08/28/2021)   Hunger Vital Sign    Worried About Running Out of Food in the Last Year: Never true    Ran Out of Food in the Last Year: Never true  Transportation Needs: No Transportation Needs (08/28/2021)   PRAPARE - Administrator, Civil Service (Medical): No    Lack of Transportation (Non-Medical): No  Physical Activity:  Insufficiently Active (08/28/2021)   Exercise Vital Sign    Days of Exercise per Week: 4 days    Minutes of Exercise per Session: 30 min  Stress: No Stress Concern Present (08/28/2021)   [employer] of Occupational Health - Occupational Stress Questionnaire    Feeling of Stress : Only a little  Social Connections: Moderately Integrated (08/28/2021)   Social Connection and Isolation Panel [NHANES]    Frequency of Communication with Friends and Family: More than three times a week    Frequency of Social Gatherings with Friends and Family: Once a week    Attends Religious Services: More than 4 times per year    Active Member of [employer] or Organizations: No     Attends Engineer, structural: Never    Marital Status: Married    Family History  Problem Relation Age of Onset   Hypertension Mother    Gout Mother    Diabetes Father    Hypertension Father    Stroke Father    Breast cancer Maternal Grandmother    Hypertension Maternal Grandfather    Heart attack Maternal Grandfather    Stroke Paternal Grandmother    Diabetes Paternal Grandfather    Hypertension Maternal Aunt    Breast cancer Maternal Aunt    Cancer Other    Hypertension Other    Diabetes Other

## 2022-10-21 NOTE — Progress Notes (Signed)
[name] = [redacted]w[redacted]d single viable IUP  CRL = 16.1 mm  bicornuate uterus with IUP seen in Rt horn  single normal ys Small intramural fibroid seen in left fundus = 15 x 9 mm Normal ovaries bilaterally - neg adnexal regions    P. Nance, RDMS

## 2022-10-27 ENCOUNTER — Other Ambulatory Visit: Payer: Self-pay | Admitting: Women's Health

## 2022-10-27 ENCOUNTER — Ambulatory Visit (INDEPENDENT_AMBULATORY_CARE_PROVIDER_SITE_OTHER): Payer: Self-pay | Admitting: Family Medicine

## 2022-10-27 ENCOUNTER — Encounter: Payer: Self-pay | Admitting: Obstetrics & Gynecology

## 2022-10-27 MED ORDER — DOXYLAMINE-PYRIDOXINE 10-10 MG PO TBEC: DELAYED_RELEASE_TABLET | ORAL | 6 refills | Status: DC

## 2022-10-29 DIAGNOSIS — O0990 Supervision of high risk pregnancy, unspecified, unspecified trimester: Secondary | ICD-10-CM | POA: Insufficient documentation

## 2022-10-30 ENCOUNTER — Inpatient Hospital Stay (HOSPITAL_COMMUNITY)
Admission: AD | Admit: 2022-10-30 | Discharge: 2022-10-30 | Disposition: A | Discharge status: Home / Self Care | Payer: Self-pay | Attending: Obstetrics & Gynecology | Admitting: Obstetrics & Gynecology

## 2022-10-30 ENCOUNTER — Encounter (HOSPITAL_COMMUNITY): Payer: Self-pay | Admitting: Obstetrics & Gynecology

## 2022-10-30 ENCOUNTER — Inpatient Hospital Stay (HOSPITAL_COMMUNITY): Payer: Self-pay

## 2022-10-30 ENCOUNTER — Other Ambulatory Visit: Payer: Self-pay

## 2022-10-30 DIAGNOSIS — N96 Recurrent pregnancy loss: Secondary | ICD-10-CM

## 2022-10-30 DIAGNOSIS — Z7901 Long term (current) use of anticoagulants: Secondary | ICD-10-CM

## 2022-10-30 DIAGNOSIS — Z79899 Other long term (current) drug therapy: Secondary | ICD-10-CM | POA: Insufficient documentation

## 2022-10-30 DIAGNOSIS — O10911 Unspecified pre-existing hypertension complicating pregnancy, first trimester: Secondary | ICD-10-CM | POA: Insufficient documentation

## 2022-10-30 DIAGNOSIS — O24111 Pre-existing diabetes mellitus, type 2, in pregnancy, first trimester: Secondary | ICD-10-CM | POA: Insufficient documentation

## 2022-10-30 DIAGNOSIS — Z7984 Long term (current) use of oral hypoglycemic drugs: Secondary | ICD-10-CM | POA: Insufficient documentation

## 2022-10-30 DIAGNOSIS — B379 Candidiasis, unspecified: Secondary | ICD-10-CM

## 2022-10-30 DIAGNOSIS — O10919 Unspecified pre-existing hypertension complicating pregnancy, unspecified trimester: Secondary | ICD-10-CM

## 2022-10-30 DIAGNOSIS — O98811 Other maternal infectious and parasitic diseases complicating pregnancy, first trimester: Secondary | ICD-10-CM | POA: Insufficient documentation

## 2022-10-30 DIAGNOSIS — Z79890 Hormone replacement therapy (postmenopausal): Secondary | ICD-10-CM | POA: Insufficient documentation

## 2022-10-30 DIAGNOSIS — O2621 Pregnancy care for patient with recurrent pregnancy loss, first trimester: Secondary | ICD-10-CM | POA: Insufficient documentation

## 2022-10-30 DIAGNOSIS — Z794 Long term (current) use of insulin: Secondary | ICD-10-CM | POA: Insufficient documentation

## 2022-10-30 DIAGNOSIS — O3401 Maternal care for unspecified congenital malformation of uterus, first trimester: Secondary | ICD-10-CM | POA: Insufficient documentation

## 2022-10-30 DIAGNOSIS — Z3A09 9 weeks gestation of pregnancy: Secondary | ICD-10-CM

## 2022-10-30 DIAGNOSIS — Q513 Bicornate uterus: Secondary | ICD-10-CM | POA: Insufficient documentation

## 2022-10-30 DIAGNOSIS — O209 Hemorrhage in early pregnancy, unspecified: Secondary | ICD-10-CM

## 2022-10-30 LAB — URINALYSIS, ROUTINE W REFLEX MICROSCOPIC
Bilirubin Urine: NEGATIVE
Glucose, UA: NEGATIVE mg/dL
Ketones, ur: NEGATIVE mg/dL
Nitrite: NEGATIVE
Protein, ur: 100 mg/dL — AB
Specific Gravity, Urine: 1.011 (ref 1.005–1.030)
pH: 6 (ref 5.0–8.0)

## 2022-10-30 LAB — WET PREP, GENITAL
Sperm: NONE SEEN
Trich, Wet Prep: NONE SEEN
WBC, Wet Prep HPF POC: >=10 — AB (ref <10)

## 2022-10-30 LAB — HCG, QUANTITATIVE, PREGNANCY: hCG, Beta Chain, Quant, S: 182423 m[IU]/mL — ABNORMAL HIGH (ref <5)

## 2022-10-30 MED ORDER — MICONAZOLE NITRATE 2 % VA CREA
1.0000 | TOPICAL_CREAM | Freq: Every day | VAGINAL | 2 refills | Status: DC
Start: 2022-10-30 — End: 2022-11-23

## 2022-10-30 NOTE — MAU Note (Addendum)
Nina Phillips is a 32 y.o. at [redacted]w[redacted]d here in MAU reporting: she's having VB with wiping and passed one small clot. Denies cramping or pain.  Last intercourse 1-2 months ago. LMP: 08/26/2022 Onset of complaint: today Pain score: 0 Vitals:   10/30/22 0939  BP: (!) 154/72  Pulse: 71  Resp: 18  Temp: 98 F (36.7 C)  SpO2: 100%     FHT:NA Lab orders placed from triage:   UA

## 2022-10-30 NOTE — MAU Provider Note (Signed)
Chief Complaint: Vaginal Bleeding  Provider at bedside 1016    SUBJECTIVE HPI: Nina Phillips is a 32 y.o. G8P0150 at [redacted]w[redacted]d by LMP who presents to maternity admissions reporting vaginal spotting. She has a history of recurrent miscarriages. Has had a dating ultrasound and is on lovenox and vaginal progesterone already. Has underlying cHTN, T2DM.  Woke up this AM and when wiped noticed bright red blood. Passed a pencil cap sized clot as well. Has not had continued bleeding that needed a pad. Denies pelvic pressure, cramping, urinary symptoms, change in vaginal discharge, new sexual partner, fever/chills. Normal Bms. Wanted to come in for evaluation out of concern for her recurrent miscarriages.  HPI  Past Medical History:  Diagnosis Date   Abnormal uterine bleeding (AUB) 07/09/2019   Anemia    Anxiety    Bicornate uterus    Depression    seeing a therapist since 3rd miscarriage, helping, doing good.   Diabetes mellitus without complication (HCC)    Type 2   Gout    History of miscarriage    Hypertension    Ovarian cyst    Past Surgical History:  Procedure Laterality Date   DILATION AND CURETTAGE OF UTERUS     WISDOM TOOTH EXTRACTION     Social History   Socioeconomic History   Marital status: Married    Spouse name: Not on file   Number of children: Not on file   Years of education: Not on file   Highest education level: Not on file  Occupational History   Not on file  Tobacco Use   Smoking status: Former   Smokeless tobacco: Never   Tobacco comments:    quit 2014  Vaping Use   Vaping status: Never Used  Substance and Sexual Activity   Alcohol use: No    Comment: occas   Drug use: Never   Sexual activity: Yes    Birth control/protection: Pill  Other Topics Concern   Not on file  Social History Narrative   Not on file   Social Determinants of Health   Financial Resource Strain: Low Risk  (08/28/2021)   Overall Financial Resource Strain (CARDIA)     Difficulty of Paying Living Expenses: Not hard at all  Food Insecurity: No Food Insecurity (08/28/2021)   Hunger Vital Sign    Worried About Running Out of Food in the Last Year: Never true    Ran Out of Food in the Last Year: Never true  Transportation Needs: No Transportation Needs (08/28/2021)   PRAPARE - Administrator, Civil Service (Medical): No    Lack of Transportation (Non-Medical): No  Physical Activity: Insufficiently Active (08/28/2021)   Exercise Vital Sign    Days of Exercise per Week: 4 days    Minutes of Exercise per Session: 30 min  Stress: No Stress Concern Present (08/28/2021)   [employer] of Occupational Health - Occupational Stress Questionnaire    Feeling of Stress : Only a little  Social Connections: Moderately Integrated (08/28/2021)   Social Connection and Isolation Panel [NHANES]    Frequency of Communication with Friends and Family: More than three times a week    Frequency of Social Gatherings with Friends and Family: Once a week    Attends Religious Services: More than 4 times per year    Active Member of [employer] or Organizations: No    Attends Banker Meetings: Never    Marital Status: Married  Catering manager Violence: Not At Risk (08/28/2021)  Humiliation, Afraid, Rape, and Kick questionnaire    Fear of Current or Ex-Partner: No    Emotionally Abused: No    Physically Abused: No    Sexually Abused: No   No current facility-administered medications on file prior to encounter.   Current Outpatient Medications on File Prior to Encounter  Medication Sig Dispense Refill   aspirin EC 81 MG tablet Take 81 mg by mouth daily. Swallow whole.     Doxylamine-Pyridoxine (DICLEGIS) 10-10 MG TBEC 2 tabs q hs, if sx persist add 1 tab q am on day 3, if sx persist add 1 tab q afternoon on day 4 100 tablet 6   enoxaparin (LOVENOX) 40 MG/0.4ML injection Inject 0.4 mLs (40 mg total) into the skin daily. 30 mL 11   Heparin Sodium, Porcine, 5000  UNIT/0.5ML SOSY Inject 5,000 Units as directed 2 (two) times daily. (Patient not taking: Reported on 04/30/2022) 30 mL 6   indomethacin (INDOCIN) 50 MG capsule Take 1 capsule (50 mg total) by mouth 3 (three) times daily with meals. (Patient not taking: Reported on 04/30/2022) 15 capsule 0   insulin aspart (NOVOLOG FLEXPEN) 100 UNIT/ML FlexPen Inject 6 Units into the skin 3 (three) times daily with meals. 15 mL 2   insulin degludec (TRESIBA FLEXTOUCH) 100 UNIT/ML FlexTouch Pen Inject 30 Units into the skin daily. (Patient not taking: Reported on 10/21/2022) 30 mL 3   losartan (COZAAR) 50 MG tablet Take 1 tablet (50 mg total) by mouth daily. (Patient not taking: Reported on 10/21/2022) 90 tablet 3   metFORMIN (GLUCOPHAGE-XR) 500 MG 24 hr tablet Take 2 tablets (1,000 mg total) by mouth in the morning and at bedtime. 360 tablet 3   Prenatal Vit-Fe Fumarate-FA (MULTIVITAMIN-PRENATAL) 27-0.8 MG TABS tablet Take 1 tablet by mouth daily at 12 noon.     progesterone (PROMETRIUM) 200 MG capsule Place 1 capsule (200 mg total) vaginally daily. 30 capsule 11   promethazine (PHENERGAN) 12.5 MG tablet Take 1 tablet (12.5 mg total) by mouth every 8 (eight) hours as needed for up to 10 days for nausea or vomiting. 10 tablet 0   Tart Cherry 1200 MG CAPS Take by mouth. (Patient not taking: Reported on 10/21/2022)     [DISCONTINUED] diphenhydrAMINE (BENADRYL) 25 mg capsule Take 25 mg by mouth every 6 (six) hours as needed for allergies (and allergic reactions).      Allergies  Allergen Reactions   Apple Fruit Extract Hives   Banana Hives   Other Hives    Walnuts    Shellfish Allergy Hives   Tomato Hives   Watermelon [Citrullus Vulgaris] Hives   Bactrim [Sulfamethoxazole-Trimethoprim] Hives and Rash   Sulfa Antibiotics Hives and Rash    ROS:  Pertinent positives/negatives listed above.  I have reviewed patient's Past Medical Hx, Surgical Hx, Family Hx, Social Hx, medications and allergies.   Physical Exam   Patient Vitals for the past 24 hrs:  BP Temp Temp src Pulse Resp SpO2 Height Weight  10/30/22 0939 (!) 154/72 98 F (36.7 C) Oral 71 18 100 % -- --  10/30/22 0934 -- -- -- -- -- -- 5\' 3"  (1.6 m) 113.6 kg   Constitutional: Well-developed, well-nourished female in no acute distress. Appears anxious Cardiovascular: normal rate Respiratory: normal effort GI: Abd soft, non-tender MS: Extremities nontender, no edema, normal ROM Neurologic: Alert and oriented x 4  LAB RESULTS Results for orders placed or performed during the hospital encounter of 10/30/22 (from the past 24 hour(s))  Urinalysis, Routine w  reflex microscopic -Urine, Clean Catch     Status: Abnormal   Collection Time: 10/30/22 10:24 AM  Result Value Ref Range   Color, Urine YELLOW YELLOW   APPearance CLOUDY (A) CLEAR   Specific Gravity, Urine 1.011 1.005 - 1.030   pH 6.0 5.0 - 8.0   Glucose, UA NEGATIVE NEGATIVE mg/dL   Hgb urine dipstick MODERATE (A) NEGATIVE   Bilirubin Urine NEGATIVE NEGATIVE   Ketones, ur NEGATIVE NEGATIVE mg/dL   Protein, ur 409 (A) NEGATIVE mg/dL   Nitrite NEGATIVE NEGATIVE   Leukocytes,Ua TRACE (A) NEGATIVE   RBC / HPF 0-5 0 - 5 RBC/hpf   WBC, UA 0-5 0 - 5 WBC/hpf   Bacteria, UA RARE (A) NONE SEEN   Squamous Epithelial / HPF 21-50 0 - 5 /HPF  Wet prep, genital     Status: Abnormal   Collection Time: 10/30/22 10:24 AM   Specimen: Urine, Clean Catch  Result Value Ref Range   Yeast Wet Prep HPF POC PRESENT (A) NONE SEEN   Trich, Wet Prep NONE SEEN NONE SEEN   Clue Cells Wet Prep HPF POC PRESENT (A) NONE SEEN   WBC, Wet Prep HPF POC >=10 (A) <10   Sperm NONE SEEN       IMAGING US OB LESS THAN 14 WEEKS WITH OB TRANSVAGINAL  Result Date: 10/30/2022 CLINICAL DATA:  Vaginal bleeding in 1st trimester pregnancy. EXAM: OBSTETRIC <14 WK [name] AND TRANSVAGINAL OB US TECHNIQUE: Both transabdominal and transvaginal ultrasound examinations were performed for complete evaluation of the gestation as well as  the maternal uterus, adnexal regions, and pelvic cul-de-sac. Transvaginal technique was performed to assess early pregnancy. COMPARISON:  None Available. FINDINGS: Intrauterine gestational sac: Single Yolk sac:  Visualized. Embryo:  Visualized. Cardiac Activity: Visualized. Heart Rate: 200 bpm CRL:  26 mm   9 w   2 d                  [name] EDC: 06/02/2023 Subchorionic hemorrhage:  None visualized. Maternal uterus/adnexae: Septate or bicornuate uterus noted, with gestational sac in right uterine horn. Both ovaries are normal in appearance. No mass or abnormal free fluid identified. IMPRESSION: Septate or bicornuate uterus, with single living IUP in right uterine horn. Estimated gestational age of [redacted] weeks 2 days, and [name] EDC of 06/02/2023. Embryonic tachycardia noted at 200 bpm. This is of uncertain clinical significance. Electronically Signed   By: Danae Orleans M.D.   On: 10/30/2022 11:21   US OB Comp Less 14 Wks  Result Date: 10/22/2022 Table formatting from the original result was not included. Images from the original result were not included.  ..an Financial trader of Ultrasound Medicine Technical sales engineer) accredited practice Center for [facility] [address] [place] [address] Ordering Provider: Lazaro Arms, MD  DATING AND VIABILITY SONOGRAM Ife Vitelli is a 32 y.o. year old G6P0150 Patient's last menstrual period was 08/26/2022. which would correlate to  [redacted]w[redacted]d weeks gestation.  She has regular menstrual cycles.   She is here today for a confirmatory initial sonogram. GESTATION: SINGLETON   FETAL ACTIVITY:          Heart rate         176 bpm          CERVIX: Appears closed ADNEXA: The ovaries are normal. GESTATIONAL AGE AND  BIOMETRICS: Gestational criteria: Estimated Date of Delivery: 06/02/23 by LMP now at [redacted]w[redacted]d Previous Scans:0    CROWN RUMP LENGTH           16.1  mm         8 weeks                                                                      AVERAGE EGA(BY THIS SCAN):  8 weeks WORKING EDD( LMP ):  06-02-2023  TECHNICIAN COMMENTS: [name] = [redacted]w[redacted]d single viable IUP  CRL = 16.1 mm  bicornuate uterus with IUP seen in Rt horn  single normal ys Small intramural fibroid seen in left fundus = 15 x 9 mm Normal ovaries bilaterally - neg adnexal regions  P. Nance, RDMS A copy of this report including all images has been saved and backed up to a second source for retrieval if needed. All measures and details of the anatomical scan, placentation, fluid volume and pelvic anatomy are contained in that report. [name], RDMS 10/21/2022 5:09 PM Clinical Impression and recommendations: I have reviewed the sonogram results above, combined with the patient's current clinical course, below are my impressions and any appropriate recommendations for management based on the sonographic findings. Viable early IUP G8P0150 Estimated Date of Delivery: 06/02/23 LMP, today's scan Bicornuate uterus, known Pregnancy in the right horn Normal general sonographic findings Recommend routine care unless otherwise clinically indicated Lazaro Arms 10/22/2022 9:20 AM   MAU Management/MDM: Orders Placed This Encounter  Procedures   Wet prep, genital   US OB LESS THAN 14 WEEKS WITH OB TRANSVAGINAL   Urinalysis, Routine w reflex microscopic -Urine, Clean Catch   hCG, quantitative, pregnancy   Discharge patient    Meds ordered this encounter  Medications   miconazole (MONISTAT 7) 2 % vaginal cream    Sig: Place 1 Applicatorful vaginally at bedtime. Apply for seven nights    Dispense:  30 g    Refill:  2    Given amount of bleeding, do not suspect that patient is actively miscarrying at this time, however could be early. Also discussed that she is likely to bleed/spot/bruise given lovenox therapy. Will obtain wet mount, GC/C, UA to rule-out other causes of bleeding. Will obtain repeat U/S given  patient's last was over a week ago to assess viability.  1127: Reassessed patient. No further bleeding. Discussed positive wet mount for yeast. Will treat with monistat. No signs of UTI or BV. U/S confirms viable IUP at this time. Mild fetal tachycardia. Has initial OB visit later this week.  Patient's BP notably elevated. She takes it regularly at home and notes pressures consistently in 120/80's. Will hold on starting Rx at this  time.  ASSESSMENT 1. Vaginal bleeding affecting early pregnancy   2. History of recurrent miscarriages   3. Chronic hypertension during pregnancy, antepartum   4. Anticoagulant long-term use   5. Yeast infection     PLAN Discharge home with strict return precautions. Allergies as of 10/30/2022       Reactions   Apple Fruit Extract Hives   Banana Hives   Other Hives   Walnuts   Shellfish Allergy Hives   Tomato Hives   Watermelon [citrullus Vulgaris] Hives   Bactrim [sulfamethoxazole-trimethoprim] Hives, Rash   Sulfa Antibiotics Hives, Rash        Medication List     STOP taking these medications    Heparin Sodium (Porcine) 5000 UNIT/0.5ML Sosy   indomethacin 50 MG capsule Commonly known as: INDOCIN   losartan 50 MG tablet Commonly known as: COZAAR   promethazine 12.5 MG tablet Commonly known as: PHENERGAN   Tart Cherry 1200 MG Caps   Tresiba FlexTouch 100 UNIT/ML FlexTouch Pen Generic drug: insulin degludec       TAKE these medications    aspirin EC 81 MG tablet Take 81 mg by mouth daily. Swallow whole.   Doxylamine-Pyridoxine 10-10 MG Tbec Commonly known as: Diclegis 2 tabs q hs, if sx persist add 1 tab q am on day 3, if sx persist add 1 tab q afternoon on day 4   enoxaparin 40 MG/0.4ML injection Commonly known as: Lovenox Inject 0.4 mLs (40 mg total) into the skin daily.   metFORMIN 500 MG 24 hr tablet Commonly known as: GLUCOPHAGE-XR Take 2 tablets (1,000 mg total) by mouth in the morning and at bedtime.   miconazole  2 % vaginal cream Commonly known as: MONISTAT 7 Place 1 Applicatorful vaginally at bedtime. Apply for seven nights   multivitamin-prenatal 27-0.8 MG Tabs tablet Take 1 tablet by mouth daily at 12 noon.   NovoLOG FlexPen 100 UNIT/ML FlexPen Generic drug: insulin aspart Inject 6 Units into the skin 3 (three) times daily with meals.   progesterone 200 MG capsule Commonly known as: Prometrium Place 1 capsule (200 mg total) vaginally daily.        Wylene Simmer, MD OB Fellow 10/30/2022  11:33 AM

## 2022-11-01 LAB — GC/CHLAMYDIA PROBE AMP (~~LOC~~) NOT AT ARMC
Chlamydia: NEGATIVE
Comment: NEGATIVE
Comment: NORMAL
Neisseria Gonorrhea: NEGATIVE

## 2022-11-05 ENCOUNTER — Ambulatory Visit (INDEPENDENT_AMBULATORY_CARE_PROVIDER_SITE_OTHER): Payer: Self-pay | Admitting: *Deleted

## 2022-11-05 ENCOUNTER — Ambulatory Visit (INDEPENDENT_AMBULATORY_CARE_PROVIDER_SITE_OTHER): Payer: Self-pay | Admitting: Advanced Practice Midwife

## 2022-11-05 ENCOUNTER — Other Ambulatory Visit: Payer: Self-pay

## 2022-11-05 ENCOUNTER — Encounter: Payer: Self-pay | Admitting: Advanced Practice Midwife

## 2022-11-05 VITALS — BP 136/81 | HR 90 | Wt 255.0 lb

## 2022-11-05 DIAGNOSIS — Z794 Long term (current) use of insulin: Secondary | ICD-10-CM

## 2022-11-05 DIAGNOSIS — E1129 Type 2 diabetes mellitus with other diabetic kidney complication: Secondary | ICD-10-CM

## 2022-11-05 DIAGNOSIS — N760 Acute vaginitis: Secondary | ICD-10-CM

## 2022-11-05 DIAGNOSIS — Z1329 Encounter for screening for other suspected endocrine disorder: Secondary | ICD-10-CM

## 2022-11-05 DIAGNOSIS — Z363 Encounter for antenatal screening for malformations: Secondary | ICD-10-CM

## 2022-11-05 DIAGNOSIS — O10911 Unspecified pre-existing hypertension complicating pregnancy, first trimester: Secondary | ICD-10-CM

## 2022-11-05 DIAGNOSIS — B9689 Other specified bacterial agents as the cause of diseases classified elsewhere: Secondary | ICD-10-CM

## 2022-11-05 DIAGNOSIS — R809 Proteinuria, unspecified: Secondary | ICD-10-CM

## 2022-11-05 DIAGNOSIS — O0991 Supervision of high risk pregnancy, unspecified, first trimester: Secondary | ICD-10-CM

## 2022-11-05 DIAGNOSIS — O10919 Unspecified pre-existing hypertension complicating pregnancy, unspecified trimester: Secondary | ICD-10-CM

## 2022-11-05 DIAGNOSIS — Z3A10 10 weeks gestation of pregnancy: Secondary | ICD-10-CM

## 2022-11-05 DIAGNOSIS — N96 Recurrent pregnancy loss: Secondary | ICD-10-CM

## 2022-11-05 MED ORDER — METRONIDAZOLE 500 MG PO TABS
500.0000 mg | ORAL_TABLET | Freq: Two times a day (BID) | ORAL | 0 refills | Status: DC
Start: 2022-11-05 — End: 2022-11-23

## 2022-11-05 NOTE — Progress Notes (Signed)
INITIAL OBSTETRICAL VISIT Patient name: Nina Phillips MRN [id number]  Date of birth: [birth year] Chief Complaint:   Initial Prenatal Visit  History of Present Illness:   Nina Phillips is a 32 y.o. G48P0150 African-American female at [redacted]w[redacted]d by LMP c/w u/s at 8.0 weeks with an Estimated Date of Delivery: 06/02/23 being seen today for her initial obstetrical visit.   Patient's last menstrual period was 08/26/2022. Her obstetrical history is significant for  SAB x 5, IUFD @ 20wks .   Today she reports  feeling well, but appropriately worried; had MAU visit on 10/5 for spotting with +viability scan .  Last pap Dec 2021. Results were: NILM w/ HRHPV not done     08/28/2021    9:27 AM 03/20/2021   12:19 PM 12/13/2019    2:52 PM 11/28/2019    2:27 PM 10/10/2019   11:06 AM  Depression screen PHQ 2/9  Decreased Interest 1 0 0 1 0  Down, Depressed, Hopeless 0 0 0 1 0  PHQ - 2 Score 1 0 0 2 0  Altered sleeping 1 0 1 1 1   Tired, decreased energy 1 1 0 0 0  Change in appetite 1 0 0 1 0  Feeling bad or failure about yourself  0 0 1 3 0  Trouble concentrating 0 0 0 1 0  Moving slowly or fidgety/restless 0 0 0 0 0  Suicidal thoughts 0 0 0 1 0  PHQ-9 Score 4 1 2 9 1         08/28/2021    9:28 AM 03/20/2021   12:20 PM 12/13/2019    2:51 PM 11/28/2019    2:31 PM  GAD 7 : Generalized Anxiety Score  Nervous, Anxious, on Edge 1 1 0 2  Control/stop worrying 1 1 1 3   Worry too much - different things 1 1 1 3   Trouble relaxing 0 0 0 3  Restless 0 0 0 1  Easily annoyed or irritable 1 1 1 1   Afraid - awful might happen 1 1 0 2  Total GAD 7 Score 5 5 3 15      Review of Systems:   Pertinent items are noted in HPI Denies cramping/contractions, leakage of fluid, vaginal bleeding, abnormal vaginal discharge w/ itching/odor/irritation, headaches, visual changes, shortness of breath, chest pain, abdominal pain, severe nausea/vomiting, or problems with urination or bowel movements unless otherwise stated  above.  Pertinent History Reviewed:  Reviewed past medical,surgical, social, obstetrical and family history.  Reviewed problem list, medications and allergies. OB History  Gravida Para Term Preterm AB Living  7 1   1 5  0  SAB IAB Ectopic Multiple Live Births  5     0      # Outcome Date GA Lbr Len/2nd Weight Sex Type Anes PTL Lv  7 Current           6 SAB 02/2021          5 Preterm 11/11/19 [redacted]w[redacted]d  5.3 oz (0.15 kg) M Vag-Spont EPI  FD  4 SAB 08/2018 [redacted]w[redacted]d            Birth Comments: missed Ab, CRL c/w [redacted]w[redacted]d, cytotec  3 SAB 03/2017          2 SAB 09/2016          1 SAB 07/2016           Physical Assessment:   Vitals:   11/05/22 1112  BP: 136/81  Pulse: 90  Weight: 255 lb (  115.7 kg)  Body mass index is 45.17 kg/m.       Physical Examination:  General appearance - well appearing, and in no distress  Mental status - alert, oriented to person, place, and time  Psych:  She has a normal mood and affect  Skin - warm and dry, normal color, no suspicious lesions noted  Chest - effort normal, all lung fields clear to auscultation bilaterally  Heart - normal rate and regular rhythm  Abdomen - soft, nontender; FHTs dopplered 164bpm  Extremities:  No swelling or varicosities noted  Pelvic - not indicated  Thin prep pap is not done   TODAY'S NT (too early)  No results found for this or any previous visit (from the past 24 hour(s)).  Assessment & Plan:  1) High-Risk Pregnancy G7P0150 at [redacted]w[redacted]d with an Estimated Date of Delivery: 06/02/23   2) Initial OB visit  3) Recurrent SABs, taking Lovenox 40mg  daily and vag progesterone q hs  4) cHTN, no meds now; baseline labs; bASA 81mg  (already on)  5) T2DM, Novalog 6u tid, Metformin 1gm bid; states fastings are in the 90s and 2h PP values are almost all <120s with highest occasionally 140s  6) +BV on labs from MAU 10/5, rx Flagyl  Meds:  Meds ordered this encounter  Medications   metroNIDAZOLE (FLAGYL) 500 MG tablet    Sig: Take 1  tablet (500 mg total) by mouth 2 (two) times daily.    Dispense:  14 tablet    Refill:  0    Order Specific Question:   Supervising Provider    Answer:   Duane Lope H [[id number]]    Initial labs obtained Continue prenatal vitamins Reviewed n/v relief measures and warning s/s to report Reviewed recommended weight gain based on pre-gravid BMI Encouraged well-balanced diet Genetic & carrier screening discussed: requests Panorama and NT/IT, declines Horizon  (previously negative) Ultrasound discussed; fetal survey: requested CCNC completed> form faxed if has or is planning to apply for medicaid The nature of [facility] [employer] with multiple MDs and other Advanced Practice Providers was explained to patient; also emphasized that fellows, residents, and students are part of our team. Does have home bp cuff. Office bp cuff given: no. Rx sent: n/a. Check bp weekly, let us know if consistently >140/90.   Indications for ASA therapy (per uptodate) One of the following: CHTN Yes T1DM or T2DM Yes  Indications for early A1C (per uptodate) BMI >=25 (>=23 in Asian women) AND one of the following Previous A1C>=5.7, impaired glucose tolerance, or impaired fasting glucose on previous testing Yes  Follow-up: Return for 2wk NT; 4wk HROB w LE; 8wk HROB w MD and anatomy u/s (MD for visits).   Orders Placed This Encounter  Procedures   Urine Culture   US OB Comp + 14 Wk   CBC/D/Plt+RPR+Rh+ABO+RubIgG...   PANORAMA PRENATAL TEST   Protein / creatinine ratio, urine   Comprehensive metabolic panel   Hemoglobin A1c   TSH    Arabella Merles [facility] 11/05/2022 11:33 AM

## 2022-11-05 NOTE — Patient Instructions (Signed)
Nina Phillips, thank you for choosing our office today! We appreciate the opportunity to meet your healthcare needs. You may receive a short survey by mail, e-mail, or through [employer]. If you are happy with your care we would appreciate if you could take just a few minutes to complete the survey questions. We read all of your comments and take your feedback very seriously. Thank you again for choosing our office.  [facility] for [employer] [facility] at [facility]  [facility] at [facility] ([address] [place], [place] [address]) Entrance C, located off of [address] Free 24/7 valet parking   Nausea & Vomiting Have saltine crackers or pretzels by your bed and eat a few bites before you raise your head out of bed in the morning Eat small frequent meals throughout the day instead of large meals Drink plenty of fluids throughout the day to stay hydrated, just don't drink a lot of fluids with your meals.  This can make your stomach fill up faster making you feel sick Do not brush your teeth right after you eat Products with real ginger are good for nausea, like ginger ale and ginger hard candy Make sure it says made with real ginger! Sucking on sour candy like lemon heads is also good for nausea If your prenatal vitamins make you nauseated, take them at night so you will sleep through the nausea Sea Bands If you feel like you need medicine for the nausea & vomiting please let us know If you are unable to keep any fluids or food down please let us know   Constipation Drink plenty of fluid, preferably water, throughout the day Eat foods high in fiber such as fruits, vegetables, and grains Exercise, such as walking, is a good way to keep your bowels regular Drink warm fluids, especially warm prune juice, or decaf coffee Eat a 1/2 cup of real oatmeal (not instant), 1/2 cup applesauce, and 1/2-1 cup warm prune juice every day If needed, you may take Colace (docusate sodium) stool  softener once or twice a day to help keep the stool soft.  If you still are having problems with constipation, you may take Miralax once daily as needed to help keep your bowels regular.   Home Blood Pressure Monitoring for Patients   Your provider has recommended that you check your blood pressure (BP) at least once a week at home. If you do not have a blood pressure cuff at home, one will be provided for you. Contact your provider if you have not received your monitor within 1 week.   Helpful Tips for Accurate Home Blood Pressure Checks  Don't smoke, exercise, or drink caffeine 30 minutes before checking your BP Use the restroom before checking your BP (a full bladder can raise your pressure) Relax in a comfortable upright chair Feet on the ground Left arm resting comfortably on a flat surface at the level of your heart Legs uncrossed Back supported Sit quietly and don't talk Place the cuff on your bare arm Adjust snuggly, so that only two fingertips can fit between your skin and the top of the cuff Check 2 readings separated by at least one minute Keep a log of your BP readings For a visual, please reference this diagram: http://ccnc.care/bpdiagram  Provider Name: [facility]     Phone: [phone]  Zone 1: ALL CLEAR  Continue to monitor your symptoms:  BP reading is less than 140 (top number) or less than 90 (bottom  number)  No right upper stomach pain No headaches or seeing spots No feeling nauseated or throwing up No swelling in face and hands  Zone 2: CAUTION Call your doctor's office for any of the following:  BP reading is greater than 140 (top number) or greater than 90 (bottom number)  Stomach pain under your ribs in the middle or right side Headaches or seeing spots Feeling nauseated or throwing up Swelling in face and hands  Zone 3: EMERGENCY  Seek immediate medical care if you have any of the following:  BP reading is greater than160 (top number) or  greater than 110 (bottom number) Severe headaches not improving with Tylenol Serious difficulty catching your breath Any worsening symptoms from Zone 2    First Trimester of Pregnancy The first trimester of pregnancy is from week 1 until the end of week 12 (months 1 through 3). A week after a sperm fertilizes an egg, the egg will implant on the wall of the uterus. This embryo will begin to develop into a baby. Genes from you and your partner are forming the baby. The female genes determine whether the baby is a boy or a girl. At 6-8 weeks, the eyes and face are formed, and the heartbeat can be seen on ultrasound. At the end of 12 weeks, all the baby's organs are formed.  Now that you are pregnant, you will want to do everything you can to have a healthy baby. Two of the most important things are to get good prenatal care and to follow your health care provider's instructions. Prenatal care is all the medical care you receive before the baby's birth. This care will help prevent, find, and treat any problems during the pregnancy and childbirth. BODY CHANGES Your body goes through many changes during pregnancy. The changes vary from woman to woman.  You may gain or lose a couple of pounds at first. You may feel sick to your stomach (nauseous) and throw up (vomit). If the vomiting is uncontrollable, call your health care provider. You may tire easily. You may develop headaches that can be relieved by medicines approved by your health care provider. You may urinate more often. Painful urination may mean you have a bladder infection. You may develop heartburn as a result of your pregnancy. You may develop constipation because certain hormones are causing the muscles that push waste through your intestines to slow down. You may develop hemorrhoids or swollen, bulging veins (varicose veins). Your breasts may begin to grow larger and become tender. Your nipples may stick out more, and the tissue that  surrounds them (areola) may become darker. Your gums may bleed and may be sensitive to brushing and flossing. Dark spots or blotches (chloasma, mask of pregnancy) may develop on your face. This will likely fade after the baby is born. Your menstrual periods will stop. You may have a loss of appetite. You may develop cravings for certain kinds of food. You may have changes in your emotions from day to day, such as being excited to be pregnant or being concerned that something may go wrong with the pregnancy and baby. You may have more vivid and strange dreams. You may have changes in your hair. These can include thickening of your hair, rapid growth, and changes in texture. Some women also have hair loss during or after pregnancy, or hair that feels dry or thin. Your hair will most likely return to normal after your baby is born. WHAT TO EXPECT AT YOUR PRENATAL  VISITS During a routine prenatal visit: You will be weighed to make sure you and the baby are growing normally. Your blood pressure will be taken. Your abdomen will be measured to track your baby's growth. The fetal heartbeat will be listened to starting around week 10 or 12 of your pregnancy. Test results from any previous visits will be discussed. Your health care provider may ask you: How you are feeling. If you are feeling the baby move. If you have had any abnormal symptoms, such as leaking fluid, bleeding, severe headaches, or abdominal cramping. If you have any questions. Other tests that may be performed during your first trimester include: Blood tests to find your blood type and to check for the presence of any previous infections. They will also be used to check for low iron levels (anemia) and Rh antibodies. Later in the pregnancy, blood tests for diabetes will be done along with other tests if problems develop. Urine tests to check for infections, diabetes, or protein in the urine. An ultrasound to confirm the proper growth  and development of the baby. An amniocentesis to check for possible genetic problems. Fetal screens for spina bifida and Down syndrome. You may need other tests to make sure you and the baby are doing well. HOME CARE INSTRUCTIONS  Medicines Follow your health care provider's instructions regarding medicine use. Specific medicines may be either safe or unsafe to take during pregnancy. Take your prenatal vitamins as directed. If you develop constipation, try taking a stool softener if your health care provider approves. Diet Eat regular, well-balanced meals. Choose a variety of foods, such as meat or vegetable-based protein, fish, milk and low-fat dairy products, vegetables, fruits, and whole grain breads and cereals. Your health care provider will help you determine the amount of weight gain that is right for you. Avoid raw meat and uncooked cheese. These carry germs that can cause birth defects in the baby. Eating four or five small meals rather than three large meals a day may help relieve nausea and vomiting. If you start to feel nauseous, eating a few soda crackers can be helpful. Drinking liquids between meals instead of during meals also seems to help nausea and vomiting. If you develop constipation, eat more high-fiber foods, such as fresh vegetables or fruit and whole grains. Drink enough fluids to keep your urine clear or pale yellow. Activity and Exercise Exercise only as directed by your health care provider. Exercising will help you: Control your weight. Stay in shape. Be prepared for labor and delivery. Experiencing pain or cramping in the lower abdomen or low back is a good sign that you should stop exercising. Check with your health care provider before continuing normal exercises. Try to avoid standing for long periods of time. Move your legs often if you must stand in one place for a long time. Avoid heavy lifting. Wear low-heeled shoes, and practice good posture. You may  continue to have sex unless your health care provider directs you otherwise. Relief of Pain or Discomfort Wear a good support bra for breast tenderness.   Take warm sitz baths to soothe any pain or discomfort caused by hemorrhoids. Use hemorrhoid cream if your health care provider approves.   Rest with your legs elevated if you have leg cramps or low back pain. If you develop varicose veins in your legs, wear support hose. Elevate your feet for 15 minutes, 3-4 times a day. Limit salt in your diet. Prenatal Care Schedule your prenatal visits by the  twelfth week of pregnancy. They are usually scheduled monthly at first, then more often in the last 2 months before delivery. Write down your questions. Take them to your prenatal visits. Keep all your prenatal visits as directed by your health care provider. Safety Wear your seat belt at all times when driving. Make a list of emergency phone numbers, including numbers for family, friends, the hospital, and police and fire departments. General Tips Ask your health care provider for a referral to a local prenatal education class. Begin classes no later than at the beginning of month 6 of your pregnancy. Ask for help if you have counseling or nutritional needs during pregnancy. Your health care provider can offer advice or refer you to specialists for help with various needs. Do not use hot tubs, steam rooms, or saunas. Do not douche or use tampons or scented sanitary pads. Do not cross your legs for long periods of time. Avoid cat litter boxes and soil used by cats. These carry germs that can cause birth defects in the baby and possibly loss of the fetus by miscarriage or stillbirth. Avoid all smoking, herbs, alcohol, and medicines not prescribed by your health care provider. Chemicals in these affect the formation and growth of the baby. Schedule a dentist appointment. At home, brush your teeth with a soft toothbrush and be gentle when you floss. SEEK  MEDICAL CARE IF:  You have dizziness. You have mild pelvic cramps, pelvic pressure, or nagging pain in the abdominal area. You have persistent nausea, vomiting, or diarrhea. You have a bad smelling vaginal discharge. You have pain with urination. You notice increased swelling in your face, hands, legs, or ankles. SEEK IMMEDIATE MEDICAL CARE IF:  You have a fever. You are leaking fluid from your vagina. You have spotting or bleeding from your vagina. You have severe abdominal cramping or pain. You have rapid weight gain or loss. You vomit blood or material that looks like coffee grounds. You are exposed to [name] measles and have never had them. You are exposed to fifth disease or chickenpox. You develop a severe headache. You have shortness of breath. You have any kind of trauma, such as from a fall or a car accident. Document Released: 01/05/2001 Document Revised: 05/28/2013 Document Reviewed: 11/21/2012 [facility] Patient Information 2015 [place], [place]. This information is not intended to replace advice given to you by your health care provider. Make sure you discuss any questions you have with your health care provider.

## 2022-11-10 ENCOUNTER — Other Ambulatory Visit: Payer: Self-pay | Admitting: Women's Health

## 2022-11-10 ENCOUNTER — Encounter: Payer: Self-pay | Admitting: Women's Health

## 2022-11-10 DIAGNOSIS — O99891 Other specified diseases and conditions complicating pregnancy: Secondary | ICD-10-CM | POA: Insufficient documentation

## 2022-11-10 LAB — URINE CULTURE

## 2022-11-10 MED ORDER — CEPHALEXIN 500 MG PO CAPS: 500.0000 mg | ORAL_CAPSULE | Freq: Four times a day (QID) | ORAL | 0 refills | Status: DC

## 2022-11-11 LAB — PANORAMA PRENATAL TEST FULL PANEL:PANORAMA TEST PLUS 5 ADDITIONAL MICRODELETIONS: FETAL FRACTION: 2.5

## 2022-11-16 ENCOUNTER — Encounter: Payer: Self-pay | Admitting: Women's Health

## 2022-11-19 ENCOUNTER — Other Ambulatory Visit: Payer: Self-pay

## 2022-11-22 ENCOUNTER — Encounter: Payer: Self-pay | Admitting: Women's Health

## 2022-11-23 ENCOUNTER — Other Ambulatory Visit: Payer: Self-pay | Admitting: Advanced Practice Midwife

## 2022-11-23 ENCOUNTER — Encounter: Payer: Self-pay | Admitting: Women's Health

## 2022-11-23 ENCOUNTER — Encounter: Payer: Self-pay | Admitting: Adult Health

## 2022-11-23 ENCOUNTER — Ambulatory Visit (INDEPENDENT_AMBULATORY_CARE_PROVIDER_SITE_OTHER): Payer: Self-pay | Admitting: Adult Health

## 2022-11-23 ENCOUNTER — Encounter: Payer: Self-pay | Admitting: *Deleted

## 2022-11-23 ENCOUNTER — Ambulatory Visit (INDEPENDENT_AMBULATORY_CARE_PROVIDER_SITE_OTHER): Payer: Self-pay

## 2022-11-23 VITALS — BP 134/87 | HR 83 | Ht 63.0 in | Wt 252.8 lb

## 2022-11-23 DIAGNOSIS — Z1329 Encounter for screening for other suspected endocrine disorder: Secondary | ICD-10-CM

## 2022-11-23 DIAGNOSIS — N96 Recurrent pregnancy loss: Secondary | ICD-10-CM

## 2022-11-23 DIAGNOSIS — O4690 Antepartum hemorrhage, unspecified, unspecified trimester: Secondary | ICD-10-CM

## 2022-11-23 DIAGNOSIS — Z3A10 10 weeks gestation of pregnancy: Secondary | ICD-10-CM

## 2022-11-23 DIAGNOSIS — N920 Excessive and frequent menstruation with regular cycle: Secondary | ICD-10-CM

## 2022-11-23 DIAGNOSIS — E1129 Type 2 diabetes mellitus with other diabetic kidney complication: Secondary | ICD-10-CM

## 2022-11-23 DIAGNOSIS — O039 Complete or unspecified spontaneous abortion without complication: Secondary | ICD-10-CM

## 2022-11-23 DIAGNOSIS — Z3682 Encounter for antenatal screening for nuchal translucency: Secondary | ICD-10-CM

## 2022-11-23 DIAGNOSIS — O021 Missed abortion: Secondary | ICD-10-CM | POA: Insufficient documentation

## 2022-11-23 DIAGNOSIS — O0991 Supervision of high risk pregnancy, unspecified, first trimester: Secondary | ICD-10-CM

## 2022-11-23 DIAGNOSIS — O10919 Unspecified pre-existing hypertension complicating pregnancy, unspecified trimester: Secondary | ICD-10-CM

## 2022-11-23 DIAGNOSIS — Z3A12 12 weeks gestation of pregnancy: Secondary | ICD-10-CM

## 2022-11-23 DIAGNOSIS — Z363 Encounter for antenatal screening for malformations: Secondary | ICD-10-CM

## 2022-11-23 DIAGNOSIS — B9689 Other specified bacterial agents as the cause of diseases classified elsewhere: Secondary | ICD-10-CM

## 2022-11-23 DIAGNOSIS — O4691 Antepartum hemorrhage, unspecified, first trimester: Secondary | ICD-10-CM

## 2022-11-23 DIAGNOSIS — Q513 Bicornate uterus: Secondary | ICD-10-CM

## 2022-11-23 NOTE — Progress Notes (Signed)
[name] 9+6 wks,single IUP no fetal heart tones visualized,CRL 28.14 mm,normal ovaries,Jennifer Griffin discussed results with patient   Chaperone Pam

## 2022-11-23 NOTE — Progress Notes (Signed)
Subjective:     Patient ID: Nina Phillips, female   DOB: 1990/08/11, 32 y.o.   MRN: [id number]  HPI Evanny is a 32 year old black female,married, G7P0150, in for [name] and had had some spotting, on [name] no FHT, fetal pole measured 9+6 weeks and should have been 12+5 weeks, she has bicornuate uterus.     Component Value Date/Time   DIAGPAP  12/26/2019 1000    - Negative for intraepithelial lesion or malignancy (NILM)   DIAGPAP  04/11/2017 0000    NEGATIVE FOR INTRAEPITHELIAL LESIONS OR MALIGNANCY.   ADEQPAP  12/26/2019 1000    Satisfactory for evaluation; transformation zone component PRESENT.   ADEQPAP  04/11/2017 0000    Satisfactory for evaluation  endocervical/transformation zone component PRESENT.   PCP is Dr Earlene Plater  Review of Systems +spotting No cramping Reviewed past medical,surgical, social and family history. Reviewed medications and allergies.     Objective:   Physical Exam BP 134/87 (BP Location: Right Arm, Patient Position: Sitting, Cuff Size: Large)   Pulse 83   Ht 5\' 3"  (1.6 m)   Wt 252 lb 12.8 oz (114.7 kg)   LMP 08/26/2022   BMI 44.78 kg/m     Skin warm and dry. Lungs: clear to ausculation bilaterally. Cardiovascular: regular rate and rhythm. Blood type is O+ Fall risk is low  Upstream - 11/23/22 1506       Pregnancy Intention Screening   Does the patient want to become pregnant in the next year? N/A    Does the patient's partner want to become pregnant in the next year? N/A    Would the patient like to discuss contraceptive options today? No      Contraception Wrap Up   Current Method Pregnant/Seeking Pregnancy    End Method Pregnant/Seeking Pregnancy             Assessment:     1. Spotting  2. Bicornuate uterus  3. Miscarriage Discussed expectant and medical management and surgical management I think surgical management my be best choice since 9+6 weeks by [name], and she agrees, does not want cytotec  4. History of recurrent  miscarriages     Plan:     Return 11/24/22 at 11:30 am to see Dr Charlotta Newton about scheduling D&C

## 2022-11-24 ENCOUNTER — Encounter: Payer: Self-pay | Admitting: Obstetrics & Gynecology

## 2022-11-24 ENCOUNTER — Ambulatory Visit (INDEPENDENT_AMBULATORY_CARE_PROVIDER_SITE_OTHER): Payer: Self-pay | Admitting: Obstetrics & Gynecology

## 2022-11-24 VITALS — BP 138/81 | HR 84 | Wt 252.6 lb

## 2022-11-24 DIAGNOSIS — Q513 Bicornate uterus: Secondary | ICD-10-CM

## 2022-11-24 DIAGNOSIS — O2622 Pregnancy care for patient with recurrent pregnancy loss, second trimester: Secondary | ICD-10-CM

## 2022-11-24 DIAGNOSIS — O039 Complete or unspecified spontaneous abortion without complication: Secondary | ICD-10-CM

## 2022-11-24 NOTE — Progress Notes (Signed)
GYN VISIT Patient name: Nina Phillips MRN [id number]  Date of birth: 29-Jul-1990 Chief Complaint:   Follow-up (Discuss options)  History of Present Illness:   Nina Phillips is a 32 y.o. G69P0150 female being seen today for miscarriage follow up.  Recent US yesterday showed miscarriage at 9 weeks.  Of note patient has known recurrent pregnancy loss.  She has taken Cytotec in the past and does not want to have to go through that again.  She is most interested in surgical intervention.  She denies pelvic or abdominal pain.  She has noted some mild spotting.  She otherwise reports no acute GYN concerns  Patient's last menstrual period was 08/26/2022.    Review of Systems:   Pertinent items are noted in HPI Denies fever/chills, dizziness, headaches, visual disturbances, fatigue, shortness of breath, chest pain, abdominal pain, vomiting. Pertinent History Reviewed:   Past Surgical History:  Procedure Laterality Date   DILATION AND CURETTAGE OF UTERUS     WISDOM TOOTH EXTRACTION      Past Medical History:  Diagnosis Date   Abnormal uterine bleeding (AUB) 07/09/2019   Anemia    Anxiety    Bicornate uterus    Depression    seeing a therapist since 3rd miscarriage, helping, doing good.   Diabetes mellitus without complication (HCC)    Type 2   Gout    History of miscarriage    Hypertension    Ovarian cyst    Reviewed problem list, medications and allergies. Physical Assessment:   Vitals:   11/24/22 1104 11/24/22 1125  BP: (!) 149/91 138/81  Pulse: 91 84  Weight: 252 lb 9.6 oz (114.6 kg)   Body mass index is 44.75 kg/m.       Physical Examination:   General appearance: No distress, appears sad  Psych: Appropriately tearful  Skin: warm & dry   Cardiovascular: normal heart rate noted  Respiratory: normal respiratory effort, no distress  Abdomen: soft, non-tender   Pelvic: examination not indicated  Extremities: no edema   Chaperone: N/A    Assessment & Plan:   1) Miscarriage, recurrent pregnancy loss -discussed all options including meds vs surgery -pt would prefer surgical intervention -discussed risk/benefit including but not limited to risk of bleeding, infection, injury due to uterine perforation. -Will plan for procedure under ultrasound guidance due to bicornuate uterus -plan for genetic testing -Encouraged grief counseling  2) Family planning -May consider contraception in the future  Insurance starts Nov 1- plan to proceed with surgical intervention after this date  Return for TBD- plan for surgery.   Myna Hidalgo, DO Attending Obstetrician & Gynecologist, [facility] for [employer], [facility]

## 2022-11-29 ENCOUNTER — Encounter: Payer: Self-pay | Admitting: Obstetrics & Gynecology

## 2022-11-29 ENCOUNTER — Telehealth: Payer: Self-pay

## 2022-11-29 ENCOUNTER — Encounter (HOSPITAL_COMMUNITY): Payer: Self-pay | Admitting: Obstetrics & Gynecology

## 2022-11-29 ENCOUNTER — Encounter: Payer: Self-pay | Admitting: Women's Health

## 2022-11-29 ENCOUNTER — Inpatient Hospital Stay (HOSPITAL_COMMUNITY)
Admission: AD | Admit: 2022-11-29 | Discharge: 2022-11-29 | Disposition: A | Discharge status: Home / Self Care | Payer: 59 | Attending: Obstetrics and Gynecology | Admitting: Obstetrics and Gynecology

## 2022-11-29 DIAGNOSIS — N939 Abnormal uterine and vaginal bleeding, unspecified: Secondary | ICD-10-CM

## 2022-11-29 DIAGNOSIS — O209 Hemorrhage in early pregnancy, unspecified: Secondary | ICD-10-CM | POA: Diagnosis not present

## 2022-11-29 DIAGNOSIS — Z3A09 9 weeks gestation of pregnancy: Secondary | ICD-10-CM

## 2022-11-29 DIAGNOSIS — M545 Low back pain, unspecified: Secondary | ICD-10-CM | POA: Diagnosis not present

## 2022-11-29 DIAGNOSIS — O039 Complete or unspecified spontaneous abortion without complication: Secondary | ICD-10-CM | POA: Diagnosis not present

## 2022-11-29 DIAGNOSIS — O021 Missed abortion: Secondary | ICD-10-CM | POA: Insufficient documentation

## 2022-11-29 DIAGNOSIS — R109 Unspecified abdominal pain: Secondary | ICD-10-CM | POA: Diagnosis not present

## 2022-11-29 LAB — CBC WITH DIFFERENTIAL/PLATELET
Abs Immature Granulocytes: 0.03 10*3/uL (ref 0.00–0.07)
Basophils Absolute: 0.1 10*3/uL (ref 0.0–0.1)
Basophils Relative: 1 %
Eosinophils Absolute: 0.1 10*3/uL (ref 0.0–0.5)
Eosinophils Relative: 1 %
HCT: 32.3 % — ABNORMAL LOW (ref 36.0–46.0)
Hemoglobin: 10.3 g/dL — ABNORMAL LOW (ref 12.0–15.0)
Immature Granulocytes: 0 %
Lymphocytes Relative: 29 %
Lymphs Abs: 3.1 10*3/uL (ref 0.7–4.0)
MCH: 24.1 pg — ABNORMAL LOW (ref 26.0–34.0)
MCHC: 31.9 g/dL (ref 30.0–36.0)
MCV: 75.6 fL — ABNORMAL LOW (ref 80.0–100.0)
Monocytes Absolute: 0.8 10*3/uL (ref 0.1–1.0)
Monocytes Relative: 7 %
Neutro Abs: 6.9 10*3/uL (ref 1.7–7.7)
Neutrophils Relative %: 62 %
Platelets: 431 10*3/uL — ABNORMAL HIGH (ref 150–400)
RBC: 4.27 MIL/uL (ref 3.87–5.11)
RDW: 16.9 % — ABNORMAL HIGH (ref 11.5–15.5)
WBC: 10.9 10*3/uL — ABNORMAL HIGH (ref 4.0–10.5)
nRBC: 0 % (ref 0.0–0.2)

## 2022-11-29 LAB — COMPREHENSIVE METABOLIC PANEL
ALT: 12 U/L (ref 0–44)
AST: 12 U/L — ABNORMAL LOW (ref 15–41)
Albumin: 2.8 g/dL — ABNORMAL LOW (ref 3.5–5.0)
Alkaline Phosphatase: 79 U/L (ref 38–126)
Anion gap: 10 (ref 5–15)
BUN: 10 mg/dL (ref 6–20)
CO2: 20 mmol/L — ABNORMAL LOW (ref 22–32)
Calcium: 9.3 mg/dL (ref 8.9–10.3)
Chloride: 106 mmol/L (ref 98–111)
Creatinine, Ser: 0.96 mg/dL (ref 0.44–1.00)
GFR, Estimated: >60 mL/min (ref >60)
Glucose, Bld: 104 mg/dL — ABNORMAL HIGH (ref 70–99)
Potassium: 3.8 mmol/L (ref 3.5–5.1)
Sodium: 136 mmol/L (ref 135–145)
Total Bilirubin: 0.4 mg/dL (ref <1.2)
Total Protein: 7.1 g/dL (ref 6.5–8.1)

## 2022-11-29 MED ORDER — OXYCODONE HCL 5 MG PO TABS
5.0000 mg | ORAL_TABLET | ORAL | 0 refills | Status: DC | PRN
Start: 2022-11-29 — End: 2022-12-10

## 2022-11-29 MED ORDER — MORPHINE SULFATE (PF) 4 MG/ML IV SOLN
4.0000 mg | Freq: Once | INTRAVENOUS | Status: AC
  Administered 2022-11-29: 4 mg via INTRAMUSCULAR
  Filled 2022-11-29: qty 1

## 2022-11-29 MED ORDER — FENTANYL CITRATE (PF) 100 MCG/2ML IJ SOLN: 50.0000 ug | Freq: Once | INTRAMUSCULAR | Status: DC

## 2022-11-29 MED ORDER — CYCLOBENZAPRINE HCL 5 MG PO TABS
10.0000 mg | ORAL_TABLET | Freq: Once | ORAL | Status: AC
  Administered 2022-11-29: 10 mg via ORAL
  Filled 2022-11-29: qty 2

## 2022-11-29 MED ORDER — ACETAMINOPHEN 500 MG PO TABS
1000.0000 mg | ORAL_TABLET | Freq: Once | ORAL | Status: AC
  Administered 2022-11-29: 1000 mg via ORAL
  Filled 2022-11-29: qty 2

## 2022-11-29 NOTE — MAU Provider Note (Addendum)
History     CSN: [id number]  Arrival date and time: 11/29/22 1739   Event Date/Time   First Provider Initiated Contact with Patient 11/29/22 1854      Chief Complaint  Patient presents with   Vaginal Bleeding   Nina Phillips , a  32 y.o. G7P0150 at [redacted]w[redacted]d presents to MAU with complaints of vaginal bleeding and lower back and abdominal pain. Patient reports bright red vaginal bleeding and passing clots starting about 12 pm today Patient describes light to moderate vaginal bleeding. She also reports lower abdominal pain like period cramps. She states that the pain waxes and wanes and denies attempting to relieve symptoms. Currently rating pain a 9/10. She denies any other symptoms.   Patient has a known IUFD at [redacted]w[redacted]d and is scheduled for a D&C on Friday.         OB History     Gravida  7   Para  1   Term      Preterm  1   AB  5   Living  0      SAB  5   IAB      Ectopic      Multiple  0   Live Births              Past Medical History:  Diagnosis Date   Abnormal uterine bleeding (AUB) 07/09/2019   Anemia    Anxiety    Bicornate uterus    Depression    seeing a therapist since 3rd miscarriage, helping, doing good.   Diabetes mellitus without complication (HCC)    Type 2   Gout    History of miscarriage    Hypertension    Ovarian cyst     Past Surgical History:  Procedure Laterality Date   WISDOM TOOTH EXTRACTION      Family History  Problem Relation Age of Onset   Hypertension Mother    Gout Mother    Diabetes Father    Hypertension Father    Stroke Father    Breast cancer Maternal Grandmother    Hypertension Maternal Grandfather    Heart attack Maternal Grandfather    Stroke Paternal Grandmother    Diabetes Paternal Grandfather    Hypertension Maternal Aunt    Breast cancer Maternal Aunt    Cancer Other    Hypertension Other    Diabetes Other     Social History   Tobacco Use   Smoking status: Former   Smokeless tobacco:  Never   Tobacco comments:    quit 2014  Vaping Use   Vaping status: Never Used  Substance Use Topics   Alcohol use: Not Currently    Comment: occas   Drug use: Never    Allergies:  Allergies  Allergen Reactions   Apple Fruit Extract Hives   Banana Hives   Other Hives    Walnuts    Shellfish Allergy Hives   Tomato Hives   Watermelon [Citrullus Vulgaris] Hives   Bactrim [Sulfamethoxazole-Trimethoprim] Hives and Rash   Sulfa Antibiotics Hives and Rash    Medications Prior to Admission  Medication Sig Dispense Refill Last Dose   aspirin EC 81 MG tablet Take 81 mg by mouth daily. Swallow whole.   11/29/2022 at 0616   insulin aspart (NOVOLOG FLEXPEN) 100 UNIT/ML FlexPen Inject 6 Units into the skin 3 (three) times daily with meals. 15 mL 2 11/29/2022 at 0730   metFORMIN (GLUCOPHAGE-XR) 500 MG 24 hr tablet Take 2 tablets (1,000 mg  total) by mouth in the morning and at bedtime. 360 tablet 3 11/29/2022 at 1200    Review of Systems  Constitutional:  Negative for chills, fatigue and fever.  Eyes:  Negative for pain and visual disturbance.  Respiratory:  Negative for apnea, shortness of breath and wheezing.   Cardiovascular:  Negative for chest pain and palpitations.  Gastrointestinal:  Positive for abdominal pain. Negative for constipation, diarrhea, nausea and vomiting.  Genitourinary:  Positive for vaginal bleeding. Negative for difficulty urinating, dysuria, pelvic pain, vaginal discharge and vaginal pain.  Musculoskeletal:  Positive for back pain.  Neurological:  Negative for seizures, weakness and headaches.  Psychiatric/Behavioral:  Negative for suicidal ideas.    Physical Exam   Blood pressure 137/81, pulse 95, temperature 98.4 F (36.9 C), resp. rate 18, height 5\' 3"  (1.6 m), last menstrual period 08/26/2022.  Physical Exam Vitals and nursing note reviewed. Exam conducted with a chaperone present.  Constitutional:      General: She is not in acute distress.     Appearance: Normal appearance.  HENT:     Head: Normocephalic.  Pulmonary:     Effort: Pulmonary effort is normal.  Genitourinary:    Comments: Minimal amount of brown/red vaginal bleeding noted on pad. No clots.  Musculoskeletal:     Cervical back: Normal range of motion.  Skin:    General: Skin is warm and dry.  Neurological:     Mental Status: She is alert and oriented to person, place, and time.  Psychiatric:        Mood and Affect: Mood normal.     MAU Course  Procedures Orders Placed This Encounter  Procedures   CBC with Differential/Platelet   Comprehensive metabolic panel   Insert peripheral IV   Results for orders placed or performed during the hospital encounter of 11/29/22 (from the past 24 hour(s))  CBC with Differential/Platelet     Status: Abnormal   Collection Time: 11/29/22  7:23 PM  Result Value Ref Range   WBC 10.9 (H) 4.0 - 10.5 K/uL   RBC 4.27 3.87 - 5.11 MIL/uL   Hemoglobin 10.3 (L) 12.0 - 15.0 g/dL   HCT 16.1 (L) 09.6 - 04.5 %   MCV 75.6 (L) 80.0 - 100.0 fL   MCH 24.1 (L) 26.0 - 34.0 pg   MCHC 31.9 30.0 - 36.0 g/dL   RDW 40.9 (H) 81.1 - 91.4 %   Platelets 431 (H) 150 - 400 K/uL   nRBC 0.0 0.0 - 0.2 %   Neutrophils Relative % 62 %   Neutro Abs 6.9 1.7 - 7.7 K/uL   Lymphocytes Relative 29 %   Lymphs Abs 3.1 0.7 - 4.0 K/uL   Monocytes Relative 7 %   Monocytes Absolute 0.8 0.1 - 1.0 K/uL   Eosinophils Relative 1 %   Eosinophils Absolute 0.1 0.0 - 0.5 K/uL   Basophils Relative 1 %   Basophils Absolute 0.1 0.0 - 0.1 K/uL   Immature Granulocytes 0 %   Abs Immature Granulocytes 0.03 0.00 - 0.07 K/uL  Comprehensive metabolic panel     Status: Abnormal   Collection Time: 11/29/22  7:23 PM  Result Value Ref Range   Sodium 136 135 - 145 mmol/L   Potassium 3.8 3.5 - 5.1 mmol/L   Chloride 106 98 - 111 mmol/L   CO2 20 (L) 22 - 32 mmol/L   Glucose, Bld 104 (H) 70 - 99 mg/dL   BUN 10 6 - 20 mg/dL   Creatinine, Ser 7.82  0.44 - 1.00 mg/dL   Calcium 9.3  8.9 - 16.1 mg/dL   Total Protein 7.1 6.5 - 8.1 g/dL   Albumin 2.8 (L) 3.5 - 5.0 g/dL   AST 12 (L) 15 - 41 U/L   ALT 12 0 - 44 U/L   Alkaline Phosphatase 79 38 - 126 U/L   Total Bilirubin 0.4 <1.2 mg/dL   GFR, Estimated >09 >60 mL/min   Anion gap 10 5 - 15    Patient Vitals for the past 24 hrs:  BP Temp Pulse Resp Height  11/29/22 1836 137/81 -- 95 -- --  11/29/22 1816 135/84 98.4 F (36.9 C) 90 18 5\' 3"  (1.6 m)    MDM - Hgb 10.4  - bleeding minimal  - VS stable.  - Patient hemodynamically stable at this time.  - Pain decreased from 10/10-->8/10 with Tylenol and Flexeril.  - IM morphine ordered  - MVA not offered given bicornate uterus.  - Consulted Dr. Berton Lan on patient plan of care. Reviewed patient presentation and current clinical picture. Given patient stability at this time, as long as pain is well controlled, patient stable for discharge.  - MD reports that she will attempt to get D&C moved up in the week.  - plan for discharge.    Assessment and Plan   1. Miscarriage   2. Vaginal bleeding   3. [redacted] weeks gestation of pregnancy    - Reviewed worsening signs and return precautions. - Rx for pain management sent to outpatient pharmacy. - Reviewed that patient is likely starting process for miscarriage. Discussed expectations.  - Patient discharged home in stable condition and may return to MAU as needed.   Kailly Richoux Danella Deis) Suzie Portela, MSN, CNM  [facility]  11/29/2022 8:25 PM

## 2022-11-29 NOTE — Telephone Encounter (Signed)
Telephone call from patient complaining of nausea, lower back and abdominal pain and bleeding that started this morning. Patient states that she is scheduled for a D&C this Friday but doesn't want to wait because she doesn't want to see another baby again after many miscarriages. Patient states her phone is not working and she is at work. Patient states we can call her back at work [phone] and ask for her.  Patient had already sent a mychart message regarding this that was forwarded to Dr. Despina Hidden. After talking with Dr. Despina Hidden patient is to be called back and told to go to the MAU as there are no earlier available D&C to offer. Two attempts to call patient back at work and not able to get through to ask for her.

## 2022-11-29 NOTE — MAU Note (Signed)
.  Nina Phillips is a 32 y.o. at [redacted]w[redacted]d here in MAU reporting: scheduled for [facility] Friday. Bleeding has gotten a little more heavier and back and abd cramping worse.  LMP:  Onset of complaint: earlier this morning Pain score: 10 Vitals:   11/29/22 1816  BP: 135/84  Pulse: 90  Resp: 18  Temp: 98.4 F (36.9 C)     FHT:n/a Lab orders placed from triage:

## 2022-11-30 ENCOUNTER — Telehealth: Payer: Self-pay

## 2022-11-30 ENCOUNTER — Encounter: Payer: Self-pay | Admitting: Obstetrics & Gynecology

## 2022-11-30 ENCOUNTER — Other Ambulatory Visit (HOSPITAL_COMMUNITY): Payer: Self-pay | Admitting: Obstetrics & Gynecology

## 2022-11-30 DIAGNOSIS — O021 Missed abortion: Secondary | ICD-10-CM

## 2022-11-30 NOTE — Telephone Encounter (Signed)
Called patient to notify her that we were able to move her surgery date up. Patient is scheduled on 12/02/22 at [facility] at 11:30 am. Patient is aware she must arrive by 9:30 am. Pre-op instructions were provided by phone. Confirmed details will be sent to patients mychart.

## 2022-11-30 NOTE — Telephone Encounter (Signed)
-----   Message from Lennart Pall sent at 11/29/2022  7:58 PM EST ----- Regarding: Moving D&E up Hi Govani Radloff,   Pt presented to MAU today with cramping and bleeding. Nothing that required an emergent D&C overnight, but she was wondering if her procedure could be moved earlier in the week? She would prefer to be scheduled with a provider other than Dr. Charlotta Newton and would be willing to go to [facility] main or [facility].  Thanks,  KF

## 2022-12-01 ENCOUNTER — Encounter: Payer: Self-pay | Admitting: Obstetrics & Gynecology

## 2022-12-01 ENCOUNTER — Telehealth: Payer: Self-pay | Admitting: Obstetrics & Gynecology

## 2022-12-01 NOTE — Telephone Encounter (Signed)
-  called pt states, she just passed the baby at work

## 2022-12-02 ENCOUNTER — Encounter (HOSPITAL_COMMUNITY): Admission: RE | Admit: 2022-12-02 | Payer: 59 | Source: Ambulatory Visit

## 2022-12-02 ENCOUNTER — Ambulatory Visit (HOSPITAL_COMMUNITY): Payer: 59

## 2022-12-02 ENCOUNTER — Encounter: Payer: Self-pay | Admitting: *Deleted

## 2022-12-02 ENCOUNTER — Ambulatory Visit (HOSPITAL_COMMUNITY): Admission: RE | Admit: 2022-12-02 | Payer: 59 | Source: Home / Self Care | Admitting: Obstetrics & Gynecology

## 2022-12-02 ENCOUNTER — Encounter (HOSPITAL_COMMUNITY): Admission: RE | Payer: Self-pay | Source: Home / Self Care

## 2022-12-02 DIAGNOSIS — O021 Missed abortion: Secondary | ICD-10-CM | POA: Diagnosis present

## 2022-12-02 SURGERY — DILATION AND EVACUATION, UTERUS
Anesthesia: General

## 2022-12-10 ENCOUNTER — Ambulatory Visit: Payer: 59 | Admitting: Obstetrics & Gynecology

## 2022-12-10 ENCOUNTER — Encounter: Payer: Self-pay | Admitting: Obstetrics & Gynecology

## 2022-12-10 ENCOUNTER — Other Ambulatory Visit (HOSPITAL_COMMUNITY)
Admission: RE | Admit: 2022-12-10 | Discharge: 2022-12-10 | Disposition: A | Discharge status: Home / Self Care | Payer: 59 | Source: Ambulatory Visit | Attending: Obstetrics & Gynecology | Admitting: Obstetrics & Gynecology

## 2022-12-10 VITALS — BP 174/96 | HR 72 | Ht 63.0 in | Wt 257.0 lb

## 2022-12-10 DIAGNOSIS — Z124 Encounter for screening for malignant neoplasm of cervix: Secondary | ICD-10-CM | POA: Insufficient documentation

## 2022-12-10 DIAGNOSIS — E119 Type 2 diabetes mellitus without complications: Secondary | ICD-10-CM | POA: Diagnosis not present

## 2022-12-10 DIAGNOSIS — O039 Complete or unspecified spontaneous abortion without complication: Secondary | ICD-10-CM

## 2022-12-10 DIAGNOSIS — Z3201 Encounter for pregnancy test, result positive: Secondary | ICD-10-CM

## 2022-12-10 DIAGNOSIS — N96 Recurrent pregnancy loss: Secondary | ICD-10-CM | POA: Diagnosis not present

## 2022-12-10 DIAGNOSIS — Z3A09 9 weeks gestation of pregnancy: Secondary | ICD-10-CM

## 2022-12-10 LAB — POCT URINE PREGNANCY: Preg Test, Ur: POSITIVE — AB

## 2022-12-10 NOTE — Progress Notes (Signed)
   GYN VISIT Patient name: Nina Phillips MRN [id number]  Date of birth: 1990-07-01 Chief Complaint:   Follow-up (On miscarriage; pap today!!)  History of Present Illness:   Nina Phillips is a 32 y.o. G22P0150 female being seen today for follow up from recent miscarriage  Pt states she has been doing ok.  Mood appropriate.  Denies pelvic or abodminal pain.  Denies heavy vaginal bleeding.  No acute concerns.  Patient's last menstrual period was 08/26/2022.    Review of Systems:   Pertinent items are noted in HPI Denies fever/chills, dizziness, headaches, visual disturbances, fatigue, shortness of breath, chest pain, abdominal pain, vomiting, no problems with periods, bowel movements, urination, or intercourse unless otherwise stated above.  Pertinent History Reviewed:   Past Surgical History:  Procedure Laterality Date   WISDOM TOOTH EXTRACTION      Past Medical History:  Diagnosis Date   Abnormal uterine bleeding (AUB) 07/09/2019   Anemia    Anxiety    Bicornate uterus    Depression    seeing a therapist since 3rd miscarriage, helping, doing good.   Diabetes mellitus without complication (HCC)    Type 2   Gout    History of miscarriage    Hypertension    Miscarriage    Ovarian cyst    Vitamin D deficiency 10/02/2020   Reviewed problem list, medications and allergies. Physical Assessment:   Vitals:   12/10/22 0955 12/10/22 1025  BP: (!) 174/101 (!) 174/96  Pulse: 92 72  Weight: 257 lb (116.6 kg)   Height: 5\' 3"  (1.6 m)   Body mass index is 45.53 kg/m.       Physical Examination:   General appearance: alert, well appearing, and in no distress  Psych: mood appropriate, normal affect  Skin: warm & dry   Cardiovascular: normal heart rate noted  Respiratory: normal respiratory effort, no distress  Abdomen: soft, non-tender   Pelvic: VULVA: normal appearing vulva with no masses, tenderness or lesions, VAGINA: normal appearing vagina with normal color and  discharge, no lesions, CERVIX: normal appearing cervix without discharge or lesions, UTERUS: uterus is normal size, shape, consistency and nontender, ADNEXA: normal adnexa in size, nontender and no masses  Extremities: no edema   Chaperone: Malachy Mood    Assessment & Plan:  1) Recent miscarriage, Recurrent pregnancy loss (RPL) -pt reports work up completed by Dr. Jeannie Fend- not seen in records -lab work today including HCG level  -Discussed future family planning-she does desire a pregnancy.  Plan to work on her health specifically her DM.  Plan to start trying again in 3 mos.     []  once pregnancy will plan for Lovenox, ASA and progesterone supplementation  2) preventive screening -pap due today   Orders Placed This Encounter  Procedures   Cardiolipin antibodies, IgM+IgG   Lupus anticoagulant   Beta-2 Glycoprotein I Ab,G/M   Beta hCG quant (ref lab)   HgB A1c   POCT urine pregnancy    Return in about 1 year (around 12/10/2023) for Annual.   Myna Hidalgo, DO Attending Obstetrician & Gynecologist, [facility], [facility]

## 2022-12-14 LAB — CYTOLOGY - PAP
Comment: NEGATIVE
Diagnosis: NEGATIVE
Diagnosis: REACTIVE
High risk HPV: NEGATIVE

## 2022-12-31 DIAGNOSIS — Z794 Long term (current) use of insulin: Secondary | ICD-10-CM | POA: Diagnosis not present

## 2022-12-31 DIAGNOSIS — Z823 Family history of stroke: Secondary | ICD-10-CM | POA: Diagnosis not present

## 2022-12-31 DIAGNOSIS — Z8249 Family history of ischemic heart disease and other diseases of the circulatory system: Secondary | ICD-10-CM | POA: Diagnosis not present

## 2022-12-31 DIAGNOSIS — Z87891 Personal history of nicotine dependence: Secondary | ICD-10-CM | POA: Diagnosis not present

## 2022-12-31 DIAGNOSIS — E119 Type 2 diabetes mellitus without complications: Secondary | ICD-10-CM | POA: Diagnosis not present

## 2022-12-31 DIAGNOSIS — Z882 Allergy status to sulfonamides status: Secondary | ICD-10-CM | POA: Diagnosis not present

## 2022-12-31 DIAGNOSIS — Z7984 Long term (current) use of oral hypoglycemic drugs: Secondary | ICD-10-CM | POA: Diagnosis not present

## 2022-12-31 DIAGNOSIS — I10 Essential (primary) hypertension: Secondary | ICD-10-CM | POA: Diagnosis not present

## 2022-12-31 DIAGNOSIS — Z91013 Allergy to seafood: Secondary | ICD-10-CM | POA: Diagnosis not present

## 2022-12-31 DIAGNOSIS — Z833 Family history of diabetes mellitus: Secondary | ICD-10-CM | POA: Diagnosis not present

## 2022-12-31 DIAGNOSIS — Z7982 Long term (current) use of aspirin: Secondary | ICD-10-CM | POA: Diagnosis not present

## 2023-01-03 ENCOUNTER — Encounter: Payer: Self-pay | Admitting: Obstetrics & Gynecology

## 2023-01-13 ENCOUNTER — Other Ambulatory Visit: Payer: Self-pay

## 2023-01-13 ENCOUNTER — Encounter: Payer: Self-pay | Admitting: Obstetrics & Gynecology

## 2023-02-11 ENCOUNTER — Other Ambulatory Visit: Payer: Self-pay | Admitting: Internal Medicine

## 2023-02-12 ENCOUNTER — Telehealth: Payer: 59 | Admitting: Nurse Practitioner

## 2023-02-12 DIAGNOSIS — Z794 Long term (current) use of insulin: Secondary | ICD-10-CM | POA: Diagnosis not present

## 2023-02-12 DIAGNOSIS — E1129 Type 2 diabetes mellitus with other diabetic kidney complication: Secondary | ICD-10-CM

## 2023-02-12 DIAGNOSIS — R809 Proteinuria, unspecified: Secondary | ICD-10-CM | POA: Diagnosis not present

## 2023-02-12 DIAGNOSIS — M1000 Idiopathic gout, unspecified site: Secondary | ICD-10-CM

## 2023-02-12 MED ORDER — METFORMIN HCL ER 500 MG PO TB24: 1000.0000 mg | ORAL_TABLET | Freq: Two times a day (BID) | ORAL | 0 refills | Status: DC

## 2023-02-12 MED ORDER — INDOMETHACIN 50 MG PO CAPS: 50.0000 mg | ORAL_CAPSULE | Freq: Three times a day (TID) | ORAL | 0 refills | Status: AC

## 2023-02-12 NOTE — Patient Instructions (Signed)
  Leandra Kern, thank you for joining Claiborne Rigg, NP for today's virtual visit.  While this provider is not your primary care provider (PCP), if your PCP is located in our provider database this encounter information will be shared with them immediately following your visit.   A [facility] MyChart account gives you access to today's visit and all your visits, tests, and labs performed at [facility] " click here if you don't have a [facility] MyChart account or go to [email or web address]  Consent: (Patient) Nina Phillips provided verbal consent for this virtual visit at the beginning of the encounter.  Current Medications:  Current Outpatient Medications:    indomethacin (INDOCIN) 50 MG capsule, Take 1 capsule (50 mg total) by mouth 3 (three) times daily with meals for 7 days., Disp: 21 capsule, Rfl: 0   aspirin EC 81 MG tablet, Take 81 mg by mouth daily. Swallow whole., Disp: , Rfl:    insulin aspart (NOVOLOG FLEXPEN) 100 UNIT/ML FlexPen, Inject 6 Units into the skin 3 (three) times daily with meals., Disp: 15 mL, Rfl: 2   metFORMIN (GLUCOPHAGE-XR) 500 MG 24 hr tablet, Take 2 tablets (1,000 mg total) by mouth in the morning and at bedtime., Disp: 120 tablet, Rfl: 0   Medications ordered in this encounter:  Meds ordered this encounter  Medications   indomethacin (INDOCIN) 50 MG capsule    Sig: Take 1 capsule (50 mg total) by mouth 3 (three) times daily with meals for 7 days.    Dispense:  21 capsule    Refill:  0    Supervising Provider:   Merrilee Jansky [[id number]]   metFORMIN (GLUCOPHAGE-XR) 500 MG 24 hr tablet    Sig: Take 2 tablets (1,000 mg total) by mouth in the morning and at bedtime.    Dispense:  120 tablet    Refill:  0    Supervising Provider:   Merrilee Jansky [[id number]]     *If you need refills on other medications prior to your next appointment, please contact your pharmacy*  Follow-Up: Call back or seek an in-person evaluation if the  symptoms worsen or if the condition fails to improve as anticipated.  [facility] [phone]   If you have been instructed to have an in-person evaluation today at a local Urgent Care facility, please use the link below. It will take you to a list of all of our available [facility], including address, phone number and hours of operation. Please do not delay care.  [facility]  If you or a family member do not have a primary care provider, use the link below to schedule a visit and establish care. When you choose a [facility] primary care physician or advanced practice provider, you gain a long-term partner in health. Find a Primary Care Provider  Learn more about [facility]'s in-office and virtual care options:  - Get Care Now

## 2023-02-12 NOTE — Progress Notes (Signed)
Virtual Visit Consent   Nina Phillips, you are scheduled for a virtual visit with a [facility] provider today. Just as with appointments in the office, your consent must be obtained to participate. Your consent will be active for this visit and any virtual visit you may have with one of our providers in the next 365 days. If you have a MyChart account, a copy of this consent can be sent to you electronically.  As this is a virtual visit, video technology does not allow for your provider to perform a traditional examination. This may limit your provider's ability to fully assess your condition. If your provider identifies any concerns that need to be evaluated in person or the need to arrange testing (such as labs, EKG, etc.), we will make arrangements to do so. Although advances in technology are sophisticated, we cannot ensure that it will always work on either your end or our end. If the connection with a video visit is poor, the visit may have to be switched to a telephone visit. With either a video or telephone visit, we are not always able to ensure that we have a secure connection.  By engaging in this virtual visit, you consent to the provision of healthcare and authorize for your insurance to be billed (if applicable) for the services provided during this visit. Depending on your insurance coverage, you may receive a charge related to this service.  I need to obtain your verbal consent now. Are you willing to proceed with your visit today? Nina Phillips has provided verbal consent on 02/12/2023 for a virtual visit (video or telephone). Nina Rigg, NP  Date: 02/12/2023 2:30 PM  Virtual Visit via Video Note   I, Nina Phillips, connected with  Nina Phillips  ([id number], 1990/10/17) on 02/12/23 at  2:30 PM EST by a video-enabled telemedicine application and verified that I am speaking with the correct person using two identifiers.  Location: Patient: Virtual Visit Location Patient:  Home Provider: Virtual Visit Location Provider: Home Office   I discussed the limitations of evaluation and management by telemedicine and the availability of in person appointments. The patient expressed understanding and agreed to proceed.    History of Present Illness: Nina Phillips is a 33 y.o. who identifies as a female who was assigned female at birth, and is being seen today for gout flare of the ankle and toe.  Nina Phillips states she has been experiencing a gout flare in the right ankle and right big toe. There is associated swelling and pain in the area. Onset of symptoms was 8 days ago. She is also requesting a refill of her metformin.    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 Swallow whole., Disp: , Rfl:    insulin aspart (NOVOLOG FLEXPEN) 100 UNIT/ML FlexPen, Inject 6 Units into the skin 3 (three) times daily with meals.,  Disp: 15 mL, Rfl: 2   metFORMIN (GLUCOPHAGE-XR) 500 MG 24 hr tablet, Take 2 tablets (1,000 mg total) by mouth in the morning and at bedtime., Disp: 120 tablet, Rfl: 0  Observations/Objective: Patient is well-developed, well-nourished in no acute distress.  Resting comfortably at home.  Head is normocephalic, atraumatic.  No labored breathing.  Speech is clear and coherent with logical content.  Patient is alert and oriented at baseline.    Assessment and Plan: 1. Acute idiopathic gout, unspecified site (Primary) - indomethacin (INDOCIN) 50 MG capsule; Take 1 capsule (50 mg total) by mouth 3 (three) times daily with meals for 7 days.  Dispense: 21 capsule; Refill: 0  2. Type 2 diabetes mellitus with microalbuminuria, with long-term current use of insulin (HCC) - metFORMIN (GLUCOPHAGE-XR) 500 MG 24 hr tablet; Take 2 tablets (1,000 mg total) by mouth in the morning and at bedtime.  Dispense: 120 tablet; Refill: 0   Follow Up Instructions: I discussed the assessment and treatment plan with the patient. The patient was provided an opportunity to ask questions and all were answered. The patient agreed with the plan and demonstrated an understanding of the instructions.  A copy of instructions were sent to the patient via MyChart unless otherwise noted below.    The patient was advised to call back or seek an in-person evaluation if the symptoms worsen or if the condition fails to improve as anticipated.    Nina Rigg, NP

## 2023-02-16 ENCOUNTER — Other Ambulatory Visit: Payer: Self-pay

## 2023-02-16 DIAGNOSIS — Z794 Long term (current) use of insulin: Secondary | ICD-10-CM

## 2023-02-16 MED ORDER — METFORMIN HCL ER 500 MG PO TB24: 1000.0000 mg | ORAL_TABLET | Freq: Two times a day (BID) | ORAL | 0 refills | Status: DC

## 2023-02-16 NOTE — Telephone Encounter (Signed)
Patient is scheduled for 04/15/2023 at 11:10 AM with Dr. Lonzo Cloud.

## 2023-02-16 NOTE — Telephone Encounter (Signed)
Pt needs to be seen for more refills. I have sent a 30 day supply. Please contact pt.

## 2023-02-17 ENCOUNTER — Encounter (HOSPITAL_COMMUNITY): Payer: Self-pay

## 2023-02-17 ENCOUNTER — Telehealth: Payer: 59 | Admitting: Family Medicine

## 2023-02-17 ENCOUNTER — Emergency Department (HOSPITAL_COMMUNITY): Admission: EM | Admit: 2023-02-17 | Discharge: 2023-02-17 | Discharge status: Against Medical Advice | Payer: 59

## 2023-02-17 ENCOUNTER — Encounter: Payer: Self-pay | Admitting: Emergency Medicine

## 2023-02-17 ENCOUNTER — Ambulatory Visit
Admission: EM | Admit: 2023-02-17 | Discharge: 2023-02-17 | Disposition: A | Discharge status: Home / Self Care | Payer: 59 | Attending: Nurse Practitioner | Admitting: Nurse Practitioner

## 2023-02-17 ENCOUNTER — Other Ambulatory Visit: Payer: Self-pay

## 2023-02-17 DIAGNOSIS — Z5321 Procedure and treatment not carried out due to patient leaving prior to being seen by health care provider: Secondary | ICD-10-CM | POA: Diagnosis not present

## 2023-02-17 DIAGNOSIS — R112 Nausea with vomiting, unspecified: Secondary | ICD-10-CM

## 2023-02-17 DIAGNOSIS — R519 Headache, unspecified: Secondary | ICD-10-CM | POA: Diagnosis not present

## 2023-02-17 DIAGNOSIS — H538 Other visual disturbances: Secondary | ICD-10-CM | POA: Diagnosis not present

## 2023-02-17 DIAGNOSIS — G8929 Other chronic pain: Secondary | ICD-10-CM | POA: Diagnosis not present

## 2023-02-17 DIAGNOSIS — G43909 Migraine, unspecified, not intractable, without status migrainosus: Secondary | ICD-10-CM | POA: Insufficient documentation

## 2023-02-17 LAB — POC URINE PREG, ED: Preg Test, Ur: NEGATIVE

## 2023-02-17 MED ORDER — ONDANSETRON 4 MG PO TBDP
4.0000 mg | ORAL_TABLET | Freq: Once | ORAL | Status: AC
  Administered 2023-02-17: 4 mg via ORAL

## 2023-02-17 MED ORDER — KETOROLAC TROMETHAMINE 30 MG/ML IJ SOLN
30.0000 mg | Freq: Once | INTRAMUSCULAR | Status: AC
  Administered 2023-02-17: 30 mg via INTRAMUSCULAR

## 2023-02-17 MED ORDER — ONDANSETRON 4 MG PO TBDP: 4.0000 mg | ORAL_TABLET | Freq: Three times a day (TID) | ORAL | 0 refills | Status: DC | PRN

## 2023-02-17 NOTE — Discharge Instructions (Addendum)
We gave you Toradol 30 mg today for your headache.  We also gave you Zofran which help with the nausea.  You can continue the Zofran at home every 8 hours as needed for nausea/vomiting.  Avoid NSAIDs for 48 hours, take Tylenol 500 to 1000 mg if headache persists.  Seek care if you develop worsening symptoms despite treatment.

## 2023-02-17 NOTE — Progress Notes (Signed)
  Because because you might be pregnant and you are a high risk for miscarriage- we need to have you seen in person to protect you and your unborn baby, your condition warrants further evaluation and I recommend that you be seen in a face-to-face visit.   NOTE: There will be NO CHARGE for this E-Visit   If you are having a true medical emergency, please call 911.

## 2023-02-17 NOTE — ED Provider Notes (Signed)
[facility]    CSN: [id number] Arrival date & time: 02/17/23  1751      History   Chief Complaint Chief Complaint  Patient presents with   Headache    HPI Nina Phillips is a 33 y.o. female.   Patient presents today for migraine that has been ongoing for the past couple of days.  She reports it is presenting as a typical migraine for her with pain behind her eyes, photophobia, phonophobia, and nausea/vomiting.  No recent fall, head injury, or trauma to her head.  No vision changes, recent fever, cough, congestion, or sore throat.  She has taken OTC ibuprofen, Tylenol, Benadryl, and Nurtec for symptoms without much improvement.  Reports since the headache began, she has noticed her blood pressure has also been elevated however this is typical for her when she is in pain.   Medical history is significant for recurrent miscarriage and is following with OB/GYN closely.  Most recent miscarriage was early November, 2024.  She was triaged in the ER earlier today and had a negative urine pregnancy test.      Past Medical History:  Diagnosis Date   Abnormal uterine bleeding (AUB) 07/09/2019   Anemia    Anxiety    Bicornate uterus    Depression    seeing a therapist since 3rd miscarriage, helping, doing good.   Diabetes mellitus without complication (HCC)    Type 2   Gout    History of miscarriage    Hypertension    Miscarriage    Ovarian cyst    Vitamin D deficiency 10/02/2020    Patient Active Problem List   Diagnosis Date Noted   Missed abortion measuring around 10 weeks of gestation 11/23/2022   Asymptomatic bacteriuria during pregnancy in first trimester 11/10/2022   Supervision of high-risk pregnancy 10/29/2022   Morbid obesity (HCC) 10/02/2020   History of IUFD 11/10/2019   Chronic hypertension during pregnancy, antepartum 08/27/2019   Type 2 diabetes mellitus with microalbuminuria, with long-term current use of insulin (HCC) 06/26/2019   Bicornuate  uterus 11/01/2017   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 Authorizing Provider  ondansetron (ZOFRAN-ODT) 4 MG disintegrating tablet Take 1 tablet (4 mg total) by mouth every 8 (eight) hours as needed for nausea or vomiting. 02/17/23  Yes Valentino Nose, NP  aspirin EC 81 MG tablet Take 81 mg by mouth daily. Swallow whole.    [provider]  indomethacin (INDOCIN) 50 MG capsule Take 1 capsule (50 mg total) by mouth 3 (three) times daily with meals for 7 days. 02/12/23 02/19/23  Claiborne Rigg, NP  insulin aspart (NOVOLOG FLEXPEN) 100 UNIT/ML FlexPen Inject 6 Units into the skin 3 (three) times daily with meals. 10/04/22   Shamleffer, Konrad Dolores, MD  metFORMIN (GLUCOPHAGE-XR) 500 MG 24 hr tablet Take 2 tablets (  1,000 mg total) by mouth in the morning and at bedtime. 02/16/23 03/18/23  Shamleffer, Konrad Dolores, MD  diphenhydrAMINE (BENADRYL) 25 mg capsule Take 25 mg by mouth every 6 (six) hours as needed for allergies (and allergic reactions).   12/20/18  [provider]    Family History Family History  Problem Relation Age of Onset   Hypertension Mother    Gout Mother    Diabetes Father    Hypertension Father    Stroke Father    Breast cancer Maternal Grandmother    Hypertension Maternal Grandfather    Heart attack Maternal Grandfather    Stroke Paternal Grandmother    Diabetes Paternal Grandfather    Hypertension Maternal Aunt    Breast cancer Maternal Aunt    Cancer Other    Hypertension Other    Diabetes Other     Social History Social History   Tobacco Use   Smoking status:  Former   Smokeless tobacco: Never   Tobacco comments:    quit 2014  Vaping Use   Vaping status: Never Used  Substance Use Topics   Alcohol use: Not Currently    Comment: occas   Drug use: Never     Allergies   Apple fruit extract, Banana, Other, Shellfish allergy, Tomato, Watermelon [citrullus vulgaris], Bactrim [sulfamethoxazole-trimethoprim], and Sulfa antibiotics   Review of Systems Review of Systems Per HPI  Physical Exam Triage Vital Signs ED Triage Vitals  Encounter Vitals Group     BP 02/17/23 1809 (!) 181/91     Systolic BP Percentile --      Diastolic BP Percentile --      Pulse Rate 02/17/23 1809 86     Resp 02/17/23 1809 20     Temp 02/17/23 1809 98.3 F (36.8 C)     Temp Source 02/17/23 1809 Oral     SpO2 02/17/23 1809 99 %     Weight --      Height --      Head Circumference --      Peak Flow --      Pain Score 02/17/23 1808 9     Pain Loc --      Pain Education --      Exclude from Growth Chart --    No data found.  Updated Vital Signs BP (!) 161/88 Comment: BP recheck per NP.  Pulse 86   Temp 98.3 F (36.8 C) (Oral)   Resp 20   LMP 01/13/2023 (Exact Date)   SpO2 99%   Breastfeeding No   Visual Acuity Right Eye Distance:   Left Eye Distance:   Bilateral Distance:    Right Eye Near:   Left Eye Near:    Bilateral Near:     Physical Exam Vitals and nursing note reviewed.  Constitutional:      General: She is not in acute distress.    Appearance: Normal appearance. She is not toxic-appearing.  HENT:     Head: Normocephalic and atraumatic.     Right Ear: Tympanic membrane, ear canal and external ear normal. There is no impacted cerumen.     Left Ear: Tympanic membrane, ear canal and external ear normal. There is no impacted cerumen.     Nose: Nose normal. No congestion or rhinorrhea.     Mouth/Throat:     Mouth: Mucous membranes are moist.     Pharynx: Oropharynx is clear.  Eyes:     General: No scleral icterus.       Right  eye:  No discharge.        Left eye: No discharge.     Extraocular Movements: Extraocular movements intact.     Right eye: Normal extraocular motion.     Left eye: Normal extraocular motion.     Conjunctiva/sclera: Conjunctivae normal.  Cardiovascular:     Rate and Rhythm: Normal rate and regular rhythm.  Pulmonary:     Effort: Pulmonary effort is normal. No respiratory distress.  Musculoskeletal:     Cervical back: Normal range of motion.  Lymphadenopathy:     Cervical: No cervical adenopathy.  Skin:    General: Skin is warm and dry.     Capillary Refill: Capillary refill takes less than 2 seconds.     Coloration: Skin is not jaundiced or pale.  Neurological:     General: No focal deficit present.     Mental Status: She is alert and oriented to person, place, and time.     Cranial Nerves: Cranial nerves 2-12 are intact.     Sensory: Sensation is intact.     Motor: Motor function is intact.     Coordination: Coordination is intact.     Gait: Gait is intact.  Psychiatric:        Behavior: Behavior is cooperative.      UC Treatments / Results  Labs (all labs ordered are listed, but only abnormal results are displayed) Labs Reviewed - No data to display  EKG   Radiology No results found.  Procedures Procedures (including critical care time)  Medications Ordered in UC Medications  ondansetron (ZOFRAN-ODT) disintegrating tablet 4 mg (4 mg Oral Given 02/17/23 1818)  ketorolac (TORADOL) 30 MG/ML injection 30 mg (30 mg Intramuscular Given 02/17/23 1818)    Initial Impression / Assessment and Plan / UC Course  I have reviewed the triage vital signs and the nursing notes.  Pertinent labs & imaging results that were available during my care of the patient were reviewed by me and considered in my medical decision making (see chart for details).   Initially in triage, patient is hypertensive and nauseous.  Her headache was a pain level 8/10.  After Toradol 30 mg IM and zofran 4 mg  ODT, her headache pain level improved to 6/10 and nausea resolved.  Hypertensive improved slightly - see flowsheet.   1. Chronic nonintractable headache, unspecified headache type No red flags Examination is reassuring Migraine improved with IM Toradol and ODT Zofran Continue migraine medications and home Strict ER precautions reviewed   The patient was given the opportunity to ask questions.  All questions answered to their satisfaction.  The patient is in agreement to this plan.    Final Clinical Impressions(s) / UC Diagnoses   Final diagnoses:  Chronic nonintractable headache, unspecified headache type     Discharge Instructions      We gave you Toradol 30 mg today for your headache.  We also gave you Zofran which help with the nausea.  You can continue the Zofran at home every 8 hours as needed for nausea/vomiting.  Avoid NSAIDs for 48 hours, take Tylenol 500 to 1000 mg if headache persists.  Seek care if you develop worsening symptoms despite treatment.    ED Prescriptions     Medication Sig Dispense Auth. Provider   ondansetron (ZOFRAN-ODT) 4 MG disintegrating tablet Take 1 tablet (4 mg total) by mouth every 8 (eight) hours as needed for nausea or vomiting. 20 tablet Valentino Nose, NP      PDMP not  reviewed this encounter.   Valentino Nose, NP 02/18/23 [phone]

## 2023-02-17 NOTE — ED Triage Notes (Signed)
Pt arrived via POV c/o recurring Migraine that began yesterday. Pt reports she has a Hx of migraines, and OTC medications have not relieved the symptoms. Pt reports she is working on establishing care with a neurologist. Pt endorses blurry vision, cranial pressure, photosensitivity. Nausea and vomiting.

## 2023-02-17 NOTE — ED Notes (Signed)
Reports nausea med and toradol injection have helped with discomfort. Lights remain off in room for comfort.

## 2023-02-17 NOTE — ED Triage Notes (Signed)
Pt reports hx of migraines and reports most recent episode started x2 days. Pt reports pressure behind bilateral eyes, light and sound sensitivity. Has taken ibuprofen (1-2 pm), tylenol, benadryl, and nurtec (11 am) with no change in symptoms.reports elevated bp since headache started.

## 2023-02-17 NOTE — ED Notes (Signed)
Lights turned down for comfort. NAD noted.

## 2023-03-11 ENCOUNTER — Telehealth: Payer: 59 | Admitting: Physician Assistant

## 2023-03-11 DIAGNOSIS — M10079 Idiopathic gout, unspecified ankle and foot: Secondary | ICD-10-CM | POA: Diagnosis not present

## 2023-03-11 MED ORDER — COLCHICINE 0.6 MG PO TABS
ORAL_TABLET | ORAL | 0 refills | Status: DC
Start: 2023-03-11 — End: 2023-08-01

## 2023-03-11 NOTE — Progress Notes (Signed)
E-Visit for Gout Symptoms  We are sorry that you are not feeling well. We are here to help!  Based on what you shared with me it looks like you have a flare of your gout.  Gout is a form of arthritis. It can cause pain and swelling in the joints. At first, it tends to affect only 1 joint - most frequently the big toe. It happens in people who have too much uric acid in the blood. Uric acid is a chemical that is produced when the body breaks down certain foods. Uric acid can form sharp needle-like crystals that build up in the joints and cause pain. Uric acid crystals can also form inside the tubes that carry urine from the kidneys to the bladder. These crystals can turn into "kidney stones" that can cause pain and problems with the flow of urine. People with gout get sudden "flares" or attacks of severe pain, most often the big toe, ankle, or knee. Often the joint also turns red and swells. Usually, only 1 joint is affected, but some people have pain in more than 1 joint. Gout flares tend to happen more often during the night.  The pain from gout can be extreme. The pain and swelling are worst at the beginning of a gout flare. The symptoms then get better within a few days to weeks. It is not clear how the body "turns off" a gout flare.  Do not start any NEW preventative medicine until the gout has cleared completely. However, If you are already on Probenecid or Allopurinol for CHRONIC gout, you may continue taking this during an active flare up  I have prescribed Colchicine 0.6 mg tabs - Take 2 tabs immediately, then 1 tab twice per day for the duration of the flare up to a max of 7 days (but discontinue for stomach pains or diarrhea)    HOME CARE Losing weight can help relieve gout. It's not clear that following a specific diet plan will help with gout symptoms but eating a balanced diet can help improve your overall health. It can also help you lose weight, if you are overweight. In general, a  healthy diet includes plenty of fruits, vegetables, whole grains, and low-fat dairy products (labelled "low fat", skim, 2%). Avoid sugar sweetened drinks (including sodas, tea, juice and juice blends, coffee drinks and sports drinks) Limit alcohol to 1-2 drinks of beer, spirits or wine daily these can make gout flares worse. Some people with gout also have other health problems, such as heart disease, high blood pressure, kidney disease, or obesity. If you have any of these issues, it's important to work with your doctor to manage them. This can help improve your overall health and might also help with your gout.  GET HELP RIGHT AWAY IF: Your symptoms persist after you have completed your treatment plan You develop severe diarrhea You develop abnormal sensations  You develop vomiting,   You develop weakness  You develop abdominal pain  FOLLOW UP WITH YOUR PRIMARY PROVIDER IF: If your symptoms do not improve within 10 days  MAKE SURE YOU  Understand these instructions. Will watch your condition. Will get help right away if you are not doing well or get worse.  Thank you for choosing an e-visit.  Your e-visit answers were reviewed by a board certified advanced clinical practitioner to complete your personal care plan. Depending upon the condition, your plan could have included both over the counter or prescription medications.  Please review your  pharmacy choice. Make sure the pharmacy is open so you can pick up prescription now. If there is a problem, you may contact your provider through [employer] and have the prescription routed to another pharmacy.  Your safety is important to [name]. If you have drug allergies check your prescription carefully.   For the next 24 hours you can use MyChart to ask questions about today's visit, request a non-urgent call back, or ask for a work or school excuse. You will get an email in the next two days asking about your experience. I hope that your  e-visit has been valuable and will speed your recovery.    I have spent 5 minutes in review of e-visit questionnaire, review and updating patient chart, medical decision making and response to patient.   Margaretann Loveless, PA-C

## 2023-04-15 ENCOUNTER — Other Ambulatory Visit: Payer: Self-pay | Admitting: Internal Medicine

## 2023-04-15 ENCOUNTER — Telehealth: Payer: Self-pay | Admitting: Internal Medicine

## 2023-04-15 DIAGNOSIS — E1129 Type 2 diabetes mellitus with other diabetic kidney complication: Secondary | ICD-10-CM

## 2023-04-15 MED ORDER — METFORMIN HCL ER 500 MG PO TB24: 1000.0000 mg | ORAL_TABLET | Freq: Two times a day (BID) | ORAL | 0 refills | Status: DC

## 2023-04-15 NOTE — Progress Notes (Deleted)
 Name: Nina Phillips  Age/ Sex: 33 y.o., female   MRN/ DOB: [id number], Jun 04, 1990     PCP: Arvilla Market, MD (Inactive)   Reason for Endocrinology Evaluation: Type 2 Diabetes Mellitus  Initial Endocrine Consultative Visit:  09/08/2018    PATIENT IDENTIFIER: Nina Phillips is a 33 y.o. female with a past medical history of T2DM. The patient has followed with Endocrinology clinic since 09/08/2018 for consultative assistance with management of her diabetes.  DIABETIC HISTORY:  Nina Phillips was diagnosed with T2 DM in 06/2018. Her hemoglobin A1c has ranged from 10.6% on her initial diagnosis.  On her initial visit to our clinic she had an A1c of 10.3%, she was on Lantus only, we started Metformin. Invokana caused yeast infection .   She had ~ 5 miscarriages in 2020, and again in 2021. Had fetal demise at 21 weeks  in 2022   Lives with boyfriend and his grandmother  SUBJECTIVE:   During the last visit (02/03/2022): A1c 6.6%   Today (04/15/2023): Nina Phillips is here for a follow up on diabetes. The pt has NOT been to our clinic in 14 months.    Since her last visit to our clinic she had a miscarriage 11/2022, was followed by GYN Presented to the ED in January 2025 with intractable headaches She brought her meter today but has not used it since September.  She is on OCP's at this time  Denies nausea, vomiting but has diarrhea with Metformin but this is manageable    HOME DIABETES REGIMEN:  Metformin 500 tablet,2 tabs BID  Semglee  30 units daily  Novolog 6 units tIDQAC Losartan 50 mg daily      METER DOWNLOAD SUMMARY:Has not been checking         DIABETIC COMPLICATIONS: Microvascular complications:   Denies: CKD, Neuropathy, retinopathy    Macrovascular complications:   Denies: CAD, CVA, PVD   HISTORY:  Past Medical History:  Past Medical History:  Diagnosis Date   Abnormal uterine bleeding (AUB) 07/09/2019   Anemia    Anxiety    Bicornate  uterus    Depression    seeing a therapist since 3rd miscarriage, helping, doing good.   Diabetes mellitus without complication (HCC)    Type 2   Gout    History of miscarriage    Hypertension    Miscarriage    Ovarian cyst    Vitamin D deficiency 10/02/2020   Past Surgical History:  Past Surgical History:  Procedure Laterality Date   WISDOM TOOTH EXTRACTION     Social History:  reports that she has quit smoking. She has never used smokeless tobacco. She reports that she does not currently use alcohol. She reports that she does not use drugs. Family History:  Family History  Problem Relation Age of Onset   Hypertension Mother    Gout Mother    Diabetes Father    Hypertension Father    Stroke Father    Breast cancer Maternal Grandmother    Hypertension Maternal Grandfather    Heart attack Maternal Grandfather    Stroke Paternal Grandmother    Diabetes Paternal Grandfather    Hypertension Maternal Aunt    Breast cancer Maternal Aunt    Cancer Other    Hypertension Other    Diabetes Other      HOME MEDICATIONS: Allergies as of 04/15/2023       Reactions   Apple Fruit Extract Hives   Banana Hives   Other Hives  Walnuts   Shellfish Allergy Hives   Tomato Hives   Watermelon [citrullus Vulgaris] Hives   Bactrim [sulfamethoxazole-trimethoprim] Hives, Rash   Sulfa Antibiotics Hives, Rash        Medication List        Accurate as of April 15, 2023  7:20 AM. If you have any questions, ask your nurse or doctor.          aspirin EC 81 MG tablet Take 81 mg by mouth daily. Swallow whole.   colchicine 0.6 MG tablet Take 2 tabs immediately, then 1 tab twice per day for the duration of the flare up to a max of 7 days (but discontinue for stomach pains or diarrhea)   metFORMIN 500 MG 24 hr tablet Commonly known as: GLUCOPHAGE-XR Take 2 tablets (1,000 mg total) by mouth in the morning and at bedtime.   NovoLOG FlexPen 100 UNIT/ML FlexPen Generic drug: insulin  aspart Inject 6 Units into the skin 3 (three) times daily with meals.   ondansetron 4 MG disintegrating tablet Commonly known as: ZOFRAN-ODT Take 1 tablet (4 mg total) by mouth every 8 (eight) hours as needed for nausea or vomiting.         OBJECTIVE:   Vital Signs: LMP 08/26/2022   Wt Readings from Last 3 Encounters:  02/17/23 255 lb 11.7 oz (116 kg)  12/10/22 257 lb (116.6 kg)  11/24/22 252 lb 9.6 oz (114.6 kg)     Exam: General: Pt appears well and is in NAD  Lungs: Clear with good BS bilat with no rales, rhonchi, or wheezes  Heart: RRR with normal S1 and S2 and no gallops; no murmurs; no rub  Extremities: No pretibial edema  Neuro: MS is good with appropriate affect, pt is alert and Ox3    DM foot exam: 02/03/2022    The skin of the feet is intact without sores or ulcerations. The pedal pulses are 2+ on right and 2+ on left. The sensation is intact to a screening 5.07, 10 gram monofilament bilaterally     DATA REVIEWED:  Lab Results  Component Value Date   HGBA1C 6.3 (H) 05/10/2022   HGBA1C 6.6 (A) 02/03/2022   HGBA1C 6.0 (H) 08/28/2021     Latest Reference Range & Units 11/29/22 19:23  Sodium 135 - 145 mmol/L 136  Potassium 3.5 - 5.1 mmol/L 3.8  Chloride 98 - 111 mmol/L 106  CO2 22 - 32 mmol/L 20 (L)  Glucose 70 - 99 mg/dL 161 (H)  BUN 6 - 20 mg/dL 10  Creatinine 0.96 - 0.45 mg/dL 4.09  Calcium 8.9 - 81.1 mg/dL 9.3  Anion gap 5 - 15  10  Alkaline Phosphatase 38 - 126 U/L 79  Albumin 3.5 - 5.0 g/dL 2.8 (L)  AST 15 - 41 U/L 12 (L)  ALT 0 - 44 U/L 12  Total Protein 6.5 - 8.1 g/dL 7.1  Total Bilirubin <9.1 mg/dL 0.4  GFR, Estimated >47 mL/min >60  (L): Data is abnormally low (H): Data is abnormally high  In office BG 131 mg/dL  ASSESSMENT / PLAN / RECOMMENDATIONS:   1) Type 2 Diabetes Mellitus, Optimally Controlled, With microalbuminuria  - Most recent A1c of 6.6 %. Goal A1c < 6.5 %.    - A1c at goal  - Intolerant to SGLT-2 inhibitors due to  recurrent yeast infection and hx of DKA  - We breifly discussed GLP-1 agonist but if she is going to conceieve again within a year, no need to change her  current regimen is safe and would not need to  be changed with pregnancy  - Emphasized importance of having an eye exam  - NO change  - Emphasized importance of glucose checks at home, if not once daily she should at least check it a couple of times a day [employer] would not Educational psychologist, will switch to [place]   MEDICATIONS: - Continue  Metformin 500 mg, 2 tabs with Breakfast and two tabs with supper  -Take Tresiba 30   units daily    EDUCATION / INSTRUCTIONS: BG monitoring instructions: Patient is instructed to check her blood sugars 1 times a day Call [place] [facility] if: BG persistently < 70  I reviewed the Rule of 15 for the treatment of hypoglycemia in detail with the patient. Literature supplied.     2) Diabetic complications:  Eye: Does not have known diabetic retinopathy.  Neuro/ Feet: Does not have known diabetic peripheral neuropathy. Renal: Patient does not have known baseline CKD.      3) Dyslipidemia :  - Lipids panel showed slight elevation of LDL, she has been following a low fat diet    4) Microalbuminuria :  - This was elevated in 2022, repeat today shows worsening  - Discussed ACEI/ARB in adding further renal protection but they are contra-indicated during pregnancy    Medication  Start Losartan 25 mg daily   F/U in 6 months    Signed electronically by: Lyndle Herrlich, MD  [facility] Endocrinology  [facility] [address] [place] [place], [place] [address] Phone: [phone] FAX: [phone]   CC: Arvilla Market, MD (Inactive) [address] [place] [address] Phone: [phone]  Fax: [phone]  Return to Endocrinology clinic as below: Future Appointments  Date Time Provider Department Center  04/15/2023 11:10 AM  Jovon Streetman, Konrad Dolores, MD [id number] None

## 2023-05-19 ENCOUNTER — Other Ambulatory Visit: Payer: Self-pay | Admitting: Internal Medicine

## 2023-05-19 ENCOUNTER — Encounter: Payer: Self-pay | Admitting: Obstetrics & Gynecology

## 2023-05-19 DIAGNOSIS — E1129 Type 2 diabetes mellitus with other diabetic kidney complication: Secondary | ICD-10-CM

## 2023-05-19 MED ORDER — METFORMIN HCL ER 500 MG PO TB24: 1000.0000 mg | ORAL_TABLET | Freq: Two times a day (BID) | ORAL | 0 refills | Status: DC

## 2023-06-03 ENCOUNTER — Ambulatory Visit (INDEPENDENT_AMBULATORY_CARE_PROVIDER_SITE_OTHER): Payer: Self-pay | Admitting: Internal Medicine

## 2023-06-03 ENCOUNTER — Encounter: Payer: Self-pay | Admitting: Internal Medicine

## 2023-06-03 VITALS — BP 138/86 | HR 100 | Ht 63.0 in | Wt 258.0 lb

## 2023-06-03 DIAGNOSIS — E1129 Type 2 diabetes mellitus with other diabetic kidney complication: Secondary | ICD-10-CM

## 2023-06-03 DIAGNOSIS — R809 Proteinuria, unspecified: Secondary | ICD-10-CM

## 2023-06-03 DIAGNOSIS — Z794 Long term (current) use of insulin: Secondary | ICD-10-CM

## 2023-06-03 DIAGNOSIS — E785 Hyperlipidemia, unspecified: Secondary | ICD-10-CM

## 2023-06-03 LAB — POCT GLUCOSE (DEVICE FOR HOME USE): Glucose Fasting, POC: 109 mg/dL — AB (ref 70–99)

## 2023-06-03 LAB — POCT GLYCOSYLATED HEMOGLOBIN (HGB A1C): Hemoglobin A1C: 6 % — AB (ref 4.0–5.6)

## 2023-06-03 MED ORDER — INSULIN PEN NEEDLE 32G X 4 MM MISC: 1.0000 | Freq: Three times a day (TID) | 3 refills | Status: DC

## 2023-06-03 MED ORDER — METFORMIN HCL ER 500 MG PO TB24: 1000.0000 mg | ORAL_TABLET | Freq: Two times a day (BID) | ORAL | 2 refills | Status: DC

## 2023-06-03 MED ORDER — DEXCOM G7 SENSOR MISC: 1.0000 | 3 refills | Status: DC

## 2023-06-03 MED ORDER — NOVOLOG FLEXPEN 100 UNIT/ML ~~LOC~~ SOPN: PEN_INJECTOR | SUBCUTANEOUS | 2 refills | Status: DC

## 2023-06-03 NOTE — Progress Notes (Unsigned)
 Name: Nina Phillips  Age/ Sex: 33 y.o., female   MRN/ DOB: [id number], January 15, 1991     PCP: Dale Dubonnet, MD (Inactive)   Reason for Endocrinology Evaluation: Type 2 Diabetes Mellitus  Initial Endocrine Consultative Visit:  09/08/2018    PATIENT IDENTIFIER: Nina Phillips is a 33 y.o. female with a past medical history of T2DM. The patient has followed with Endocrinology clinic since 09/08/2018 for consultative assistance with management of her diabetes.  DIABETIC HISTORY:  Nina Phillips was diagnosed with T2 DM in 06/2018. Her hemoglobin A1c has ranged from 10.6% on her initial diagnosis.  On her initial visit to our clinic she had an A1c of 10.3%, she was on Lantus  only, we started Metformin . Invokana  caused yeast infection .   She had ~ 5 miscarriages in 2020, and again in 2021. Had fetal demise at 21 weeks  in 2022   Lives with boyfriend and his grandmother  SUBJECTIVE:   During the last visit (02/03/2022): A1c 6.6%   Today (06/03/2023): Nina Phillips is here for a follow up on diabetes.The pt has NOT been to our clinic in 16 months.  She has not been checking glucose at home .   Has occasional loose stools  No nausea or vomiting     HOME DIABETES REGIMEN:  Metformin  500 tablet,2 tabs BID  Novolog  6 units TIDQAC Semglee   30 units daily       METER DOWNLOAD SUMMARY:Has not been checking         DIABETIC COMPLICATIONS: Microvascular complications:   Denies: CKD, Neuropathy, retinopathy    Macrovascular complications:   Denies: CAD, CVA, PVD   HISTORY:  Past Medical History:  Past Medical History:  Diagnosis Date   Abnormal uterine bleeding (AUB) 07/09/2019   Anemia    Anxiety    Bicornate uterus    Depression    seeing a therapist since 3rd miscarriage, helping, doing good.   Diabetes mellitus without complication (HCC)    Type 2   Gout    History of miscarriage    Hypertension    Miscarriage    Ovarian cyst    Vitamin D   deficiency 10/02/2020   Past Surgical History:  Past Surgical History:  Procedure Laterality Date   WISDOM TOOTH EXTRACTION     Social History:  reports that she has quit smoking. She has never used smokeless tobacco. She reports that she does not currently use alcohol. She reports that she does not use drugs. Family History:  Family History  Problem Relation Age of Onset   Hypertension Mother    Gout Mother    Diabetes Father    Hypertension Father    Stroke Father    Breast cancer Maternal Grandmother    Hypertension Maternal Grandfather    Heart attack Maternal Grandfather    Stroke Paternal Grandmother    Diabetes Paternal Grandfather    Hypertension Maternal Aunt    Breast cancer Maternal Aunt    Cancer Other    Hypertension Other    Diabetes Other      HOME MEDICATIONS: Allergies as of 06/03/2023       Reactions   Apple Fruit Extract Hives   Banana Hives   Other Hives   Walnuts   Shellfish Allergy Hives   Tomato Hives   Watermelon [citrullus Vulgaris] Hives   Bactrim  [sulfamethoxazole -trimethoprim ] Hives, Rash   Sulfa  Antibiotics Hives, Rash        Medication List        Accurate  as of Jun 03, 2023  9:00 AM. If you have any questions, ask your nurse or doctor.          aspirin  EC 81 MG tablet Take 81 mg by mouth daily. Swallow whole.   colchicine  0.6 MG tablet Take 2 tabs immediately, then 1 tab twice per day for the duration of the flare up to a max of 7 days (but discontinue for stomach pains or diarrhea)   metFORMIN  500 MG 24 hr tablet Commonly known as: GLUCOPHAGE -XR Take 2 tablets (1,000 mg total) by mouth in the morning and at bedtime.   NovoLOG  FlexPen 100 UNIT/ML FlexPen Generic drug: insulin  aspart Inject 6 Units into the skin 3 (three) times daily with meals.   ondansetron  4 MG disintegrating tablet Commonly known as: ZOFRAN -ODT Take 1 tablet (4 mg total) by mouth every 8 (eight) hours as needed for nausea or vomiting.          OBJECTIVE:   Vital Signs: BP 138/86 (BP Location: Left Arm, Patient Position: Sitting, Cuff Size: Large)   Pulse 100   Ht 5\' 3"  (1.6 m)   Wt 258 lb (117 kg)   LMP 08/26/2022   SpO2 98%   BMI 45.70 kg/m   Wt Readings from Last 3 Encounters:  06/03/23 258 lb (117 kg)  02/17/23 255 lb 11.7 oz (116 kg)  12/10/22 257 lb (116.6 kg)     Exam: General: Pt appears well and is in NAD  Lungs: Clear with good BS bilat   Heart: RRR   Extremities: No pretibial edema  Neuro: MS is good with appropriate affect, pt is alert and Ox3    DM foot exam: 06/03/2023    The skin of the feet is intact without sores or ulcerations. The pedal pulses are 2+ on right and 2+ on left. The sensation is intact to a screening 5.07, 10 gram monofilament bilaterally     DATA REVIEWED:  Lab Results  Component Value Date   HGBA1C 6.3 (H) 05/10/2022   HGBA1C 6.6 (A) 02/03/2022   HGBA1C 6.0 (H) 08/28/2021    Latest Reference Range & Units 11/29/22 19:23  Sodium 135 - 145 mmol/L 136  Potassium 3.5 - 5.1 mmol/L 3.8  Chloride 98 - 111 mmol/L 106  CO2 22 - 32 mmol/L 20 (L)  Glucose 70 - 99 mg/dL 161 (H)  BUN 6 - 20 mg/dL 10  Creatinine 0.96 - 0.45 mg/dL 4.09  Calcium  8.9 - 10.3 mg/dL 9.3  Anion gap 5 - 15  10  Alkaline Phosphatase 38 - 126 U/L 79  Albumin 3.5 - 5.0 g/dL 2.8 (L)  AST 15 - 41 U/L 12 (L)  ALT 0 - 44 U/L 12  Total Protein 6.5 - 8.1 g/dL 7.1  Total Bilirubin <8.1 mg/dL 0.4  GFR, Estimated >19 mL/min >60      In office BG 109 mg/dL  ASSESSMENT / PLAN / RECOMMENDATIONS:   1) Type 2 Diabetes Mellitus, Optimally Controlled, With microalbuminuria  - Most recent A1c of 6.0 %. Goal A1c < 6.5 %.    - Intolerant to SGLT-2 inhibitors due to recurrent yeast infection and hx of DKA  -GLP-1 agonist have been put on hold as in the past she was attempted to conceive, with her history of multiple miscarriages, I have encouraged the patient to seek COC's and we will consider Mounjaro - She  has not been able to obtain long-acting insulin  and has been using high dose of prandial dose of insulin  without  glucose checks, discussed the risk of hypoglycemia, encouraged the patient to check glucose on regular basis before each meal - Since her A1c is low, I will prescribe CGM technology to see if this is covered by her insurance, and she will be provided with NovoLog  per correction scale  MEDICATIONS: - Continue  Metformin  500 mg, 2 tabs with Breakfast and two tabs with supper  -CF: NovoLog  (BG -130/25) TIDQAC   EDUCATION / INSTRUCTIONS: BG monitoring instructions: Patient is instructed to check her blood sugars 1 times a day Call [place] [facility] if: BG persistently < 70  I reviewed the Rule of 15 for the treatment of hypoglycemia in detail with the patient. Literature supplied.     2) Diabetic complications:  Eye: Does not have known diabetic retinopathy.  Neuro/ Feet: Does not have known diabetic peripheral neuropathy. Renal: Patient does not have known baseline CKD.      3) Dyslipidemia :  - Lipid panel is improving - Will encourage lifestyle changes  4) Microalbuminuria :  - This was elevated in 2022, repeat today shows worsening  - I had attempted to prescribe losartan  in the past but this was discontinued when she became pregnant - Patient states she has no intention of conception anytime soon, but I have advised the patient to start COC's or use a proper form of contraception prior to initiating any ARB/ACE inhibitors   Medication  F/U in 3 months    Signed electronically by: Natale Bail, MD  [facility] Endocrinology  [facility] [address] [place] [place], [place] [address] Phone: [phone] FAX: [phone]   CC: Dale Dubonnet, MD (Inactive) [address] [place] [address] Phone: [phone]  Fax: [phone]  Return to Endocrinology clinic as below: No future  appointments.

## 2023-06-03 NOTE — Patient Instructions (Addendum)
-   Continue Metformin  500 mg, 2 tablets twice daily  Novolog  correctional insulin :  Use the scale below to help guide you before each meal  Blood sugar before meal Number of units to inject  Less than 155 0 unit  156 -  180 1 units  181 -  205 2 units  206 -  230 3 units  231 -  255 4 units  256 -  280 5 units  281 -  305 6 units  306 -  330 7 units  331 -  355 8 units  256 - 380 9 units        HOW TO TREAT LOW BLOOD SUGARS (Blood sugar LESS THAN 70 MG/DL) Please follow the RULE OF 15 for the treatment of hypoglycemia treatment (when your (blood sugars are less than 70 mg/dL)   STEP 1: Take 15 grams of carbohydrates when your blood sugar is low, which includes:  3-4 GLUCOSE TABS  OR 3-4 OZ OF JUICE OR REGULAR SODA OR ONE TUBE OF GLUCOSE GEL    STEP 2: RECHECK blood sugar in 15 MINUTES STEP 3: If your blood sugar is still low at the 15 minute recheck --> then, go back to STEP 1 and treat AGAIN with another 15 grams of carbohydrates.

## 2023-06-04 LAB — COMPREHENSIVE METABOLIC PANEL WITH GFR
AG Ratio: 1 (calc) (ref 1.0–2.5)
ALT: 10 U/L (ref 6–29)
AST: 11 U/L (ref 10–30)
Albumin: 3.7 g/dL (ref 3.6–5.1)
Alkaline phosphatase (APISO): 109 U/L (ref 31–125)
BUN/Creatinine Ratio: 11 (calc) (ref 6–22)
BUN: 13 mg/dL (ref 7–25)
CO2: 25 mmol/L (ref 20–32)
Calcium: 9.4 mg/dL (ref 8.6–10.2)
Chloride: 103 mmol/L (ref 98–110)
Creat: 1.15 mg/dL — ABNORMAL HIGH (ref 0.50–0.97)
Globulin: 3.8 g/dL — ABNORMAL HIGH (ref 1.9–3.7)
Glucose, Bld: 102 mg/dL — ABNORMAL HIGH (ref 65–99)
Potassium: 4.9 mmol/L (ref 3.5–5.3)
Sodium: 136 mmol/L (ref 135–146)
Total Bilirubin: 0.3 mg/dL (ref 0.2–1.2)
Total Protein: 7.5 g/dL (ref 6.1–8.1)
eGFR: 66 mL/min/{1.73_m2} (ref >=60)

## 2023-06-04 LAB — LIPID PANEL
Cholesterol: 196 mg/dL (ref <200)
HDL: 60 mg/dL (ref >=50)
LDL Cholesterol (Calc): 120 mg/dL — ABNORMAL HIGH
Non-HDL Cholesterol (Calc): 136 mg/dL — ABNORMAL HIGH (ref <130)
Total CHOL/HDL Ratio: 3.3 (calc) (ref <5.0)
Triglycerides: 66 mg/dL (ref <150)

## 2023-06-04 LAB — MICROALBUMIN / CREATININE URINE RATIO
Creatinine, Urine: 53 mg/dL (ref 20–275)
Microalb Creat Ratio: 1698 mg/g{creat} — ABNORMAL HIGH (ref <30)
Microalb, Ur: 90 mg/dL

## 2023-06-06 ENCOUNTER — Encounter: Payer: Self-pay | Admitting: Internal Medicine

## 2023-06-07 ENCOUNTER — Ambulatory Visit: Payer: Self-pay | Admitting: Internal Medicine

## 2023-06-23 ENCOUNTER — Telehealth: Payer: Self-pay | Admitting: Physician Assistant

## 2023-06-23 DIAGNOSIS — B9689 Other specified bacterial agents as the cause of diseases classified elsewhere: Secondary | ICD-10-CM

## 2023-06-23 DIAGNOSIS — N760 Acute vaginitis: Secondary | ICD-10-CM

## 2023-06-23 MED ORDER — METRONIDAZOLE 500 MG PO TABS: 500.0000 mg | ORAL_TABLET | Freq: Two times a day (BID) | ORAL | 0 refills | Status: AC

## 2023-06-23 NOTE — Progress Notes (Signed)

## 2023-08-01 ENCOUNTER — Telehealth: Payer: Self-pay | Admitting: Physician Assistant

## 2023-08-01 DIAGNOSIS — M10079 Idiopathic gout, unspecified ankle and foot: Secondary | ICD-10-CM

## 2023-08-01 MED ORDER — COLCHICINE 0.6 MG PO TABS: ORAL_TABLET | ORAL | 0 refills | Status: DC

## 2023-08-01 NOTE — Progress Notes (Signed)
E-Visit for Gout Symptoms  We are sorry that you are not feeling well. We are here to help!  Based on what you shared with me it looks like you have a flare of your gout.  Gout is a form of arthritis. It can cause pain and swelling in the joints. At first, it tends to affect only 1 joint - most frequently the big toe. It happens in people who have too much uric acid in the blood. Uric acid is a chemical that is produced when the body breaks down certain foods. Uric acid can form sharp needle-like crystals that build up in the joints and cause pain. Uric acid crystals can also form inside the tubes that carry urine from the kidneys to the bladder. These crystals can turn into "kidney stones" that can cause pain and problems with the flow of urine. People with gout get sudden "flares" or attacks of severe pain, most often the big toe, ankle, or knee. Often the joint also turns red and swells. Usually, only 1 joint is affected, but some people have pain in more than 1 joint. Gout flares tend to happen more often during the night.  The pain from gout can be extreme. The pain and swelling are worst at the beginning of a gout flare. The symptoms then get better within a few days to weeks. It is not clear how the body "turns off" a gout flare.  Do not start any NEW preventative medicine until the gout has cleared completely. However, If you are already on Probenecid or Allopurinol for CHRONIC gout, you may continue taking this during an active flare up  I have prescribed Colchicine 0.6 mg tabs - Take 2 tabs immediately, then 1 tab twice per day for the duration of the flare up to a max of 7 days (but discontinue for stomach pains or diarrhea)    HOME CARE Losing weight can help relieve gout. It's not clear that following a specific diet plan will help with gout symptoms but eating a balanced diet can help improve your overall health. It can also help you lose weight, if you are overweight. In general, a  healthy diet includes plenty of fruits, vegetables, whole grains, and low-fat dairy products (labelled "low fat", skim, 2%). Avoid sugar sweetened drinks (including sodas, tea, juice and juice blends, coffee drinks and sports drinks) Limit alcohol to 1-2 drinks of beer, spirits or wine daily these can make gout flares worse. Some people with gout also have other health problems, such as heart disease, high blood pressure, kidney disease, or obesity. If you have any of these issues, it's important to work with your doctor to manage them. This can help improve your overall health and might also help with your gout.  GET HELP RIGHT AWAY IF: Your symptoms persist after you have completed your treatment plan You develop severe diarrhea You develop abnormal sensations  You develop vomiting,   You develop weakness  You develop abdominal pain  FOLLOW UP WITH YOUR PRIMARY PROVIDER IF: If your symptoms do not improve within 10 days  MAKE SURE YOU  Understand these instructions. Will watch your condition. Will get help right away if you are not doing well or get worse.  Thank you for choosing an e-visit.  Your e-visit answers were reviewed by a board certified advanced clinical practitioner to complete your personal care plan. Depending upon the condition, your plan could have included both over the counter or prescription medications.  Please review your   pharmacy choice. Make sure the pharmacy is open so you can pick up prescription now. If there is a problem, you may contact your provider through MyChart messaging and have the prescription routed to another pharmacy.  Your safety is important to us. If you have drug allergies check your prescription carefully.   For the next 24 hours you can use MyChart to ask questions about today's visit, request a non-urgent call back, or ask for a work or school excuse. You will get an email in the next two days asking about your experience. I hope that your  e-visit has been valuable and will speed your recovery.  

## 2023-09-09 ENCOUNTER — Ambulatory Visit (INDEPENDENT_AMBULATORY_CARE_PROVIDER_SITE_OTHER): Payer: Self-pay | Admitting: Internal Medicine

## 2023-09-09 ENCOUNTER — Encounter: Payer: Self-pay | Admitting: Internal Medicine

## 2023-09-09 VITALS — BP 138/86 | HR 95 | Ht 63.0 in | Wt 263.0 lb

## 2023-09-09 DIAGNOSIS — E1129 Type 2 diabetes mellitus with other diabetic kidney complication: Secondary | ICD-10-CM

## 2023-09-09 DIAGNOSIS — E785 Hyperlipidemia, unspecified: Secondary | ICD-10-CM

## 2023-09-09 DIAGNOSIS — Z794 Long term (current) use of insulin: Secondary | ICD-10-CM

## 2023-09-09 DIAGNOSIS — R809 Proteinuria, unspecified: Secondary | ICD-10-CM

## 2023-09-09 LAB — POCT GLUCOSE (DEVICE FOR HOME USE): Glucose Fasting, POC: 80 mg/dL (ref 70–99)

## 2023-09-09 LAB — POCT GLYCOSYLATED HEMOGLOBIN (HGB A1C): Hemoglobin A1C: 6 % — AB (ref 4.0–5.6)

## 2023-09-09 MED ORDER — TIRZEPATIDE 5 MG/0.5ML ~~LOC~~ SOAJ: 5.0000 mg | SUBCUTANEOUS | 3 refills | Status: DC

## 2023-09-09 MED ORDER — TIRZEPATIDE 2.5 MG/0.5ML ~~LOC~~ SOAJ: 2.5000 mg | SUBCUTANEOUS | Status: DC

## 2023-09-09 MED ORDER — METFORMIN HCL ER 500 MG PO TB24: 1000.0000 mg | ORAL_TABLET | Freq: Two times a day (BID) | ORAL | 3 refills | Status: AC

## 2023-09-09 NOTE — Progress Notes (Signed)
 Name: Nina Phillips  Age/ Sex: 33 y.o., female   MRN/ DOB: [id number], 04-Nov-1990     PCP: Prentiss Dorothyann Maxwell, MD (Inactive)   Reason for Endocrinology Evaluation: Type 2 Diabetes Mellitus  Initial Endocrine Consultative Visit:  09/08/2018    PATIENT IDENTIFIER: Nina Phillips is a 33 y.o. female with a past medical history of T2DM. The patient has followed with Endocrinology clinic since 09/08/2018 for consultative assistance with management of her diabetes.  DIABETIC HISTORY:  Nina Phillips was diagnosed with T2 DM in 06/2018. Her hemoglobin A1c has ranged from 10.6% on her initial diagnosis.  On her initial visit to our clinic she had an A1c of 10.3%, she was on Lantus only, we started Metformin. Invokana caused yeast infection .   She had ~ 5 miscarriages in 2020, and again in 2021. Had fetal demise at 21 weeks  in 2022   Lives with boyfriend and his grandmother    Patient was lost to follow-up 12 clinic for approximately 16 months before her return in May, 2025, at that time her A1c was 6.0%, she was on metformin and NovoLog only, she has self discontinued basal insulin, with continued to hold off on basal insulin, and was provided with NovoLog per correction scale  SUBJECTIVE:   During the last visit (06/03/2023): A1c 6.0%   Today (09/09/2023): Nina Phillips is here for a follow up on diabetes. She checks glucose rarely .    Insurance will not pay for Dexcom No nausea or vomiting  Denies constipation or diarrhea  Not sexually active at this time, patient is  going through divorce Saw Gyn , had negative experience with COC's , but good experience with Nexplanon She is not interested in contraception at this time   HOME DIABETES REGIMEN:  Metformin 500 tablet,2 tabs BID  CF: Novolog (BG-130/25) TIDQAC- not taking       METER DOWNLOAD SUMMARY:n/a   DIABETIC COMPLICATIONS: Microvascular complications:   Denies: CKD, Neuropathy, retinopathy     Macrovascular complications:   Denies: CAD, CVA, PVD   HISTORY:  Past Medical History:  Past Medical History:  Diagnosis Date  . Abnormal uterine bleeding (AUB) 07/09/2019  . Anemia   . Anxiety   . Bicornate uterus   . Depression    seeing a therapist since 3rd miscarriage, helping, doing good.  . Diabetes mellitus without complication (HCC)    Type 2  . Gout   . History of miscarriage   . Hypertension   . Miscarriage   . Ovarian cyst   . Vitamin D deficiency 10/02/2020   Past Surgical History:  Past Surgical History:  Procedure Laterality Date  . WISDOM TOOTH EXTRACTION     Social History:  reports that she has quit smoking. She has never used smokeless tobacco. She reports that she does not currently use alcohol. She reports that she does not use drugs. Family History:  Family History  Problem Relation Age of Onset  . Hypertension Mother   . Gout Mother   . Diabetes Father   . Hypertension Father   . Stroke Father   . Breast cancer Maternal Grandmother   . Hypertension Maternal Grandfather   . Heart attack Maternal Grandfather   . Stroke Paternal Grandmother   . Diabetes Paternal Grandfather   . Hypertension Maternal Aunt   . Breast cancer Maternal Aunt   . Cancer Other   . Hypertension Other   . Diabetes Other      HOME MEDICATIONS: Allergies as  of 09/09/2023       Reactions   Apple Fruit Extract Hives   Banana Hives   Other Hives   Walnuts   Shellfish Allergy Hives   Tomato Hives   Watermelon [citrullus Vulgaris] Hives   Bactrim [sulfamethoxazole-trimethoprim] Hives, Rash   Sulfa Antibiotics Hives, Rash        Medication List        Accurate as of September 09, 2023  9:36 AM. If you have any questions, ask your nurse or doctor.          aspirin EC 81 MG tablet Take 81 mg by mouth daily. Swallow whole.   colchicine 0.6 MG tablet Take 2 tabs immediately, then 1 tab twice per day for the duration of the flare up to a max of 7 days  (but discontinue for stomach pains or diarrhea)   Dexcom G7 Sensor Misc 1 Device by Does not apply route as directed. Every 10 days   Insulin Pen Needle 32G X 4 MM Misc 1 Device by Does not apply route 3 (three) times daily.   metFORMIN 500 MG 24 hr tablet Commonly known as: GLUCOPHAGE-XR Take 2 tablets (1,000 mg total) by mouth in the morning and at bedtime.   NovoLOG FlexPen 100 UNIT/ML FlexPen Generic drug: insulin aspart Max daily 15 units   ondansetron 4 MG disintegrating tablet Commonly known as: ZOFRAN-ODT Take 1 tablet (4 mg total) by mouth every 8 (eight) hours as needed for nausea or vomiting.         OBJECTIVE:   Vital Signs: BP 138/86 (BP Location: Right Arm, Patient Position: Sitting, Cuff Size: Normal)   Pulse 95   Ht 5' 3 (1.6 m)   Wt 263 lb (119.3 kg)   SpO2 99%   BMI 46.59 kg/m   Wt Readings from Last 3 Encounters:  09/09/23 263 lb (119.3 kg)  06/03/23 258 lb (117 kg)  02/17/23 255 lb 11.7 oz (116 kg)     Exam: General: Pt appears well and is in NAD  Lungs: Clear with good BS bilat   Heart: RRR   Extremities: No pretibial edema  Neuro: MS is good with appropriate affect, pt is alert and Ox3    DM foot exam: 06/03/2023    The skin of the feet is intact without sores or ulcerations. The pedal pulses are 2+ on right and 2+ on left. The sensation is intact to a screening 5.07, 10 gram monofilament bilaterally     DATA REVIEWED:  Lab Results  Component Value Date   HGBA1C 6.0 (A) 06/03/2023   HGBA1C 6.3 (H) 05/10/2022   HGBA1C 6.6 (A) 02/03/2022    Latest Reference Range & Units 11/29/22 19:23  Sodium 135 - 145 mmol/L 136  Potassium 3.5 - 5.1 mmol/L 3.8  Chloride 98 - 111 mmol/L 106  CO2 22 - 32 mmol/L 20 (L)  Glucose 70 - 99 mg/dL 895 (H)  BUN 6 - 20 mg/dL 10  Creatinine 9.55 - 8.99 mg/dL 9.03  Calcium 8.9 - 89.6 mg/dL 9.3  Anion gap 5 - 15  10  Alkaline Phosphatase 38 - 126 U/L 79  Albumin 3.5 - 5.0 g/dL 2.8 (L)  AST 15 - 41  U/L 12 (L)  ALT 0 - 44 U/L 12  Total Protein 6.5 - 8.1 g/dL 7.1  Total Bilirubin <8.7 mg/dL 0.4  GFR, Estimated >39 mL/min >60      In office BG 80 mg/dL    ASSESSMENT / PLAN / RECOMMENDATIONS:  1) Type 2 Diabetes Mellitus, Optimally Controlled, With microalbuminuria  - Most recent A1c of 6.0 %. Goal A1c < 6.5 %.    - Intolerant to SGLT-2 inhibitors due to recurrent yeast infection and hx of DKA  -GLP-1 agonist have been put on hold as in the past she was attempted to conceive, with her history of multiple miscarriages, I have encouraged the patient to seek COC's and we will consider Mounjaro -She has stopped continued basal insulin in 2025 and we opted not to start since her A1c was 6.0% -She has not been using NovoLog per correction scale, will remove -Patient interested in weight loss, will prescribe Mounjaro - She was provided with number for pens of Mounjaro 2.5 mg weekly dose  MEDICATIONS: - Continue  Metformin 500 mg, 2 tabs with Breakfast and two tabs with supper  -Stop NovoLog  - Start Mounjaro 2.5 mg weekly for 4 weeks, then increase to 5 mg weekly   EDUCATION / INSTRUCTIONS: BG monitoring instructions: Patient is instructed to check her blood sugars 1 times a day Call [facility] if: BG persistently < 70  I reviewed the Rule of 15 for the treatment of hypoglycemia in detail with the patient. Literature supplied.     2) Diabetic complications:  Eye: Does not have known diabetic retinopathy.  Neuro/ Feet: Does not have known diabetic peripheral neuropathy. Renal: Patient does not have known baseline CKD.      3) Dyslipidemia :  - Lipid panel is improving - Will encourage lifestyle changes in the past   4) Microalbuminuria :  - This was elevated in 2022, repeat today shows worsening  - I had attempted to prescribe losartan in the past but this was discontinued when she became pregnant - Patient states she has no intention of conception  anytime soon, she has had negative experience with COC's, patient advised to use condoms if she becomes sexually active - Will repeat on next visit, if elevated will reconsider starting losartan   5) Morbid obesity:  - Will encourage lifestyle changes - Patient just on GLP-1 agonist, caution against GI side effects - Will prescribe Mounjaro/Zepbound  F/U in 4 months    Signed electronically by: Stefano Redgie Butts, MD  [facility]  [facility] [address] [place] [place], [place] [address] Phone: [phone] FAX: [phone]   CC: Prentiss Dorothyann Maxwell, MD (Inactive) [address] [place] [address] Phone: [phone]  Fax: [phone]  Return to Endocrinology clinic as below: Future Appointments  Date Time Provider Department Center  09/09/2023  9:50 AM Brittnie Lewey, Donell Redgie, MD [id number] None

## 2023-09-09 NOTE — Addendum Note (Signed)
 Addended by: OCTAVIO DIETRICH CROME on: 09/09/2023 10:21 AM   Modules accepted: Orders

## 2023-09-09 NOTE — Patient Instructions (Signed)
-   Continue Metformin 500 mg, 2 tablets twice daily  - Stop NovoLog - Start Mounjaro 2.5 mg weekly for 4 weeks, then increase to 5 mg weekly     HOW TO TREAT LOW BLOOD SUGARS (Blood sugar LESS THAN 70 MG/DL) Please follow the RULE OF 15 for the treatment of hypoglycemia treatment (when your (blood sugars are less than 70 mg/dL)   STEP 1: Take 15 grams of carbohydrates when your blood sugar is low, which includes:  3-4 GLUCOSE TABS  OR 3-4 OZ OF JUICE OR REGULAR SODA OR ONE TUBE OF GLUCOSE GEL    STEP 2: RECHECK blood sugar in 15 MINUTES STEP 3: If your blood sugar is still low at the 15 minute recheck --> then, go back to STEP 1 and treat AGAIN with another 15 grams of carbohydrates.

## 2023-10-22 ENCOUNTER — Telehealth: Payer: Self-pay

## 2023-10-22 DIAGNOSIS — M109 Gout, unspecified: Secondary | ICD-10-CM

## 2023-10-23 MED ORDER — COLCHICINE 0.6 MG PO TABS
ORAL_TABLET | ORAL | 0 refills | Status: DC
Start: 2023-10-23 — End: 2023-12-09

## 2023-10-23 MED ORDER — NAPROXEN 500 MG PO TABS: 500.0000 mg | ORAL_TABLET | Freq: Two times a day (BID) | ORAL | 0 refills | Status: DC

## 2023-10-23 NOTE — Progress Notes (Signed)
 E-Visit for Gout Symptoms  We are sorry that you are not feeling well. We are here to help!  Based on what you shared with me it looks like you have a flare of your gout.  Gout is a form of arthritis. It can cause pain and swelling in the joints. At first, it tends to affect only 1 joint - most frequently the big toe. It happens in people who have too much uric acid in the blood. Uric acid is a chemical that is produced when the body breaks down certain foods. Uric acid can form sharp needle-like crystals that build up in the joints and cause pain. Uric acid crystals can also form inside the tubes that carry urine from the kidneys to the bladder. These crystals can turn into kidney stones that can cause pain and problems with the flow of urine. People with gout get sudden flares or attacks of severe pain, most often the big toe, ankle, or knee. Often the joint also turns red and swells. Usually, only 1 joint is affected, but some people have pain in more than 1 joint. Gout flares tend to happen more often during the night.  The pain from gout can be extreme. The pain and swelling are worst at the beginning of a gout flare. The symptoms then get better within a few days to weeks. It is not clear how the body turns off a gout flare.  Do not start any NEW preventative medicine until the gout has cleared completely. However, If you are already on Probenecid or Allopurinol for CHRONIC gout, you may continue taking this during an active flare up   I have prescribed Naprosyn  500 mg twice daily as needed for mild pain for 7 days and I have prescribed Colchicine  0.6 mg tabs - Take 2 tabs immediately, then 1 tab twice per day for the duration of the flare up to a max of 7 days (but discontinue for stomach pains or diarrhea)    HOME CARE Losing weight can help relieve gout. It's not clear that following a specific diet plan will help with gout symptoms but eating a balanced diet can help improve your  overall health. It can also help you lose weight, if you are overweight. In general, a healthy diet includes plenty of fruits, vegetables, whole grains, and low-fat dairy products (labelled "low fat", skim, 2%). Avoid sugar sweetened drinks (including sodas, tea, juice and juice blends, coffee drinks and sports drinks) Limit alcohol to 1-2 drinks of beer, spirits or wine daily these can make gout flares worse. Some people with gout also have other health problems, such as heart disease, high blood pressure, kidney disease, or obesity. If you have any of these issues, it's important to work with your doctor to manage them. This can help improve your overall health and might also help with your gout.  GET HELP RIGHT AWAY IF: Your symptoms persist after you have completed your treatment plan You develop severe diarrhea You develop abnormal sensations  You develop vomiting,   You develop weakness  You develop abdominal pain  FOLLOW UP WITH YOUR PRIMARY PROVIDER IF: If your symptoms do not improve within 10 days  MAKE SURE YOU  Understand these instructions. Will watch your condition. Will get help right away if you are not doing well or get worse.  Thank you for choosing an e-visit.  Your e-visit answers were reviewed by a board certified advanced clinical practitioner to complete your personal care plan. Depending upon  the condition, your plan could have included both over the counter or prescription medications.  Please review your pharmacy choice. Make sure the pharmacy is open so you can pick up prescription now. If there is a problem, you may contact your provider through [employer] and have the prescription routed to another pharmacy.  Your safety is important to us . If you have drug allergies check your prescription carefully.   For the next 24 hours you can use MyChart to ask questions about today's visit, request a non-urgent call back, or ask for a work or school excuse. You  will get an email in the next two days asking about your experience. I hope that your e-visit has been valuable and will speed your recovery.

## 2023-10-23 NOTE — Progress Notes (Signed)
Approximately 5 minutes was spent documenting and reviewing patient's chart.

## 2023-11-30 ENCOUNTER — Encounter: Payer: Self-pay | Admitting: *Deleted

## 2023-12-08 ENCOUNTER — Telehealth: Payer: Self-pay

## 2023-12-08 DIAGNOSIS — M109 Gout, unspecified: Secondary | ICD-10-CM

## 2023-12-09 MED ORDER — COLCHICINE 0.6 MG PO TABS: ORAL_TABLET | ORAL | 0 refills | Status: DC

## 2023-12-09 NOTE — Addendum Note (Signed)
 Addended by: VIVIENNE DELON HERO on: 12/09/2023 12:21 PM   Modules accepted: Orders

## 2023-12-09 NOTE — Progress Notes (Signed)
 E-Visit for Gout Symptoms  We are sorry that you are not feeling well. We are here to help!  Based on what you shared with me it looks like you have a flare of your gout.  Gout is a form of arthritis. It can cause pain and swelling in the joints. At first, it tends to affect only 1 joint - most frequently the big toe. It happens in people who have too much uric acid in the blood. Uric acid is a chemical that is produced when the body breaks down certain foods. Uric acid can form sharp needle-like crystals that build up in the joints and cause pain. Uric acid crystals can also form inside the tubes that carry urine from the kidneys to the bladder. These crystals can turn into kidney stones that can cause pain and problems with the flow of urine. People with gout get sudden flares or attacks of severe pain, most often the big toe, ankle, or knee. Often the joint also turns red and swells. Usually, only 1 joint is affected, but some people have pain in more than 1 joint. Gout flares tend to happen more often during the night.  The pain from gout can be extreme. The pain and swelling are worst at the beginning of a gout flare. The symptoms then get better within a few days to weeks. It is not clear how the body turns off a gout flare.  Do not start any NEW preventative medicine until the gout has cleared completely. However, If you are already on Probenecid or Allopurinol for CHRONIC gout, you may continue taking this during an active flare up  I have prescribed Colchicine  0.6 mg tabs - Take 2 tabs immediately, then 1 tab twice per day for the duration of the flare up to a max of 7 days (but discontinue for stomach pains or diarrhea)   With the frequency of your gout flares, you may need to speak with your Primary Care Provider about medications that help decrease Uric Acid in the body.  HOME CARE Losing weight can help relieve gout. It's not clear that following a specific diet plan will help with  gout symptoms but eating a balanced diet can help improve your overall health. It can also help you lose weight, if you are overweight. In general, a healthy diet includes plenty of fruits, vegetables, whole grains, and low-fat dairy products (labelled "low fat", skim, 2%). Avoid sugar sweetened drinks (including sodas, tea, juice and juice blends, coffee drinks and sports drinks) Limit alcohol to 1-2 drinks of beer, spirits or wine daily these can make gout flares worse. Some people with gout also have other health problems, such as heart disease, high blood pressure, kidney disease, or obesity. If you have any of these issues, it's important to work with your doctor to manage them. This can help improve your overall health and might also help with your gout.  GET HELP RIGHT AWAY IF: Your symptoms persist after you have completed your treatment plan You develop severe diarrhea You develop abnormal sensations  You develop vomiting,   You develop weakness  You develop abdominal pain  FOLLOW UP WITH YOUR PRIMARY PROVIDER IF: If your symptoms do not improve within 10 days  MAKE SURE YOU  Understand these instructions. Will watch your condition. Will get help right away if you are not doing well or get worse.  Thank you for choosing an e-visit.  Your e-visit answers were reviewed by a board certified advanced clinical  practitioner to complete your personal care plan. Depending upon the condition, your plan could have included both over the counter or prescription medications.  Please review your pharmacy choice. Make sure the pharmacy is open so you can pick up prescription now. If there is a problem, you may contact your provider through [employer] and have the prescription routed to another pharmacy.  Your safety is important to us . If you have drug allergies check your prescription carefully.   For the next 24 hours you can use MyChart to ask questions about today's visit, request a  non-urgent call back, or ask for a work or school excuse. You will get an email in the next two days asking about your experience. I hope that your e-visit has been valuable and will speed your recovery.  I have spent 5 minutes in review of e-visit questionnaire, review and updating patient chart, medical decision making and response to patient.   Delon CHRISTELLA Dickinson, PA-C

## 2024-01-13 ENCOUNTER — Encounter: Payer: Self-pay | Admitting: Obstetrics & Gynecology

## 2024-01-22 ENCOUNTER — Telehealth: Payer: Self-pay | Admitting: Family

## 2024-01-22 DIAGNOSIS — J019 Acute sinusitis, unspecified: Secondary | ICD-10-CM

## 2024-01-22 MED ORDER — AMOXICILLIN-POT CLAVULANATE 875-125 MG PO TABS: 1.0000 | ORAL_TABLET | Freq: Two times a day (BID) | ORAL | 0 refills | Status: DC

## 2024-01-22 NOTE — Progress Notes (Signed)
 E-Visit for Sinus Problems  We are sorry that you are not feeling well.  Here is how we plan to help!  Based on what you have shared with me it looks like you have sinusitis.  Sinusitis is inflammation and infection in the sinus cavities of the head.  Based on your presentation I believe you most likely have Acute Bacterial Sinusitis.  This is an infection caused by bacteria and is treated with antibiotics. I have prescribed Augmentin  875mg /125mg  one tablet twice daily with food, for 7 days. You may use an oral decongestant such as Mucinex  D or if you have glaucoma or high blood pressure use plain Mucinex . Saline nasal spray help and can safely be used as often as needed for congestion.  If you develop worsening sinus pain, fever or notice severe headache and vision changes, or if symptoms are not better after completion of antibiotic, please schedule an appointment with a health care provider.    Sinus infections are not as easily transmitted as other respiratory infection, however we still recommend that you avoid close contact with loved ones, especially the very young and elderly.  Remember to wash your hands thoroughly throughout the day as this is the number one way to prevent the spread of infection!  Home Care: Only take medications as instructed by your medical team. Complete the entire course of an antibiotic. Do not take these medications with alcohol. A steam or ultrasonic humidifier can help congestion.  You can place a towel over your head and breathe in the steam from hot water coming from a faucet. Avoid close contacts especially the very young and the elderly. Cover your mouth when you cough or sneeze. Always remember to wash your hands.  Get Help Right Away If: You develop worsening fever or sinus pain. You develop a severe head ache or visual changes. Your symptoms persist after you have completed your treatment plan.  Make sure you Understand these instructions. Will  watch your condition. Will get help right away if you are not doing well or get worse.  Your e-visit answers were reviewed by a board certified advanced clinical practitioner to complete your personal care plan.  Depending on the condition, your plan could have included both over the counter or prescription medications.  If there is a problem please reply  once you have received a response from your provider.  Your safety is important to us .  If you have drug allergies check your prescription carefully.    You can use MyChart to ask questions about today's visit, request a non-urgent call back, or ask for a work or school excuse for 24 hours related to this e-Visit. If it has been greater than 24 hours you will need to follow up with your provider, or enter a new e-Visit to address those concerns.  You will get an e-mail in the next two days asking about your experience.  I hope that your e-visit has been valuable and will speed your recovery. Thank you for using e-visits.  I have spent 5 minutes in review of e-visit questionnaire, review and updating patient chart, medical decision making and response to patient.   Bari Learn, FNP

## 2024-01-26 ENCOUNTER — Encounter: Payer: Self-pay | Admitting: Internal Medicine

## 2024-01-26 ENCOUNTER — Telehealth: Payer: Self-pay | Admitting: Physician Assistant

## 2024-01-26 DIAGNOSIS — M79673 Pain in unspecified foot: Secondary | ICD-10-CM

## 2024-01-26 NOTE — Progress Notes (Signed)

## 2024-01-27 ENCOUNTER — Ambulatory Visit (INDEPENDENT_AMBULATORY_CARE_PROVIDER_SITE_OTHER): Payer: Self-pay | Admitting: Internal Medicine

## 2024-01-27 ENCOUNTER — Ambulatory Visit
Admission: EM | Admit: 2024-01-27 | Discharge: 2024-01-27 | Disposition: A | Discharge status: Home / Self Care | Attending: Student | Admitting: Student

## 2024-01-27 ENCOUNTER — Encounter: Payer: Self-pay | Admitting: Internal Medicine

## 2024-01-27 VITALS — BP 160/100 | Ht 63.0 in | Wt 263.0 lb

## 2024-01-27 DIAGNOSIS — E1129 Type 2 diabetes mellitus with other diabetic kidney complication: Secondary | ICD-10-CM | POA: Diagnosis not present

## 2024-01-27 DIAGNOSIS — R809 Proteinuria, unspecified: Secondary | ICD-10-CM

## 2024-01-27 DIAGNOSIS — M10071 Idiopathic gout, right ankle and foot: Secondary | ICD-10-CM | POA: Diagnosis not present

## 2024-01-27 DIAGNOSIS — M10471 Other secondary gout, right ankle and foot: Secondary | ICD-10-CM | POA: Diagnosis not present

## 2024-01-27 DIAGNOSIS — E119 Type 2 diabetes mellitus without complications: Secondary | ICD-10-CM

## 2024-01-27 DIAGNOSIS — Z794 Long term (current) use of insulin: Secondary | ICD-10-CM

## 2024-01-27 DIAGNOSIS — Z7984 Long term (current) use of oral hypoglycemic drugs: Secondary | ICD-10-CM | POA: Diagnosis not present

## 2024-01-27 DIAGNOSIS — Z3A01 Less than 8 weeks gestation of pregnancy: Secondary | ICD-10-CM

## 2024-01-27 LAB — POCT GLYCOSYLATED HEMOGLOBIN (HGB A1C): Hemoglobin A1C: 6.5 % — AB (ref 4.0–5.6)

## 2024-01-27 MED ORDER — PREDNISONE 20 MG PO TABS: 40.0000 mg | ORAL_TABLET | Freq: Every day | ORAL | 0 refills | Status: AC

## 2024-01-27 NOTE — Patient Instructions (Signed)
-   Continue Metformin  500 mg, 2 tablets twice daily  - Continue  Mounjaro  2.5 mg weekly for 4 weeks, then increase to 5 mg weekly     HOW TO TREAT LOW BLOOD SUGARS (Blood sugar LESS THAN 70 MG/DL) Please follow the RULE OF 15 for the treatment of hypoglycemia treatment (when your (blood sugars are less than 70 mg/dL)   STEP 1: Take 15 grams of carbohydrates when your blood sugar is low, which includes:  3-4 GLUCOSE TABS  OR 3-4 OZ OF JUICE OR REGULAR SODA OR ONE TUBE OF GLUCOSE GEL    STEP 2: RECHECK blood sugar in 15 MINUTES STEP 3: If your blood sugar is still low at the 15 minute recheck --> then, go back to STEP 1 and treat AGAIN with another 15 grams of carbohydrates.

## 2024-01-27 NOTE — ED Provider Notes (Signed)
 " [facility]    CSN: [id number] Arrival date & time: 01/27/24  1000      History   Chief Complaint No chief complaint on file.   HPI Nina Phillips is a 34 y.o. female presenting w gout x1 day.  Pain swelling on R ankle x1 day after eating red meat / hamburger. H/o gout, states symptoms are consistent with this. No trauma. Currently pregnant.   HPI  Past Medical History:  Diagnosis Date   Abnormal uterine bleeding (AUB) 07/09/2019   Anemia    Anxiety    Bicornate uterus    Depression    seeing a therapist since 3rd miscarriage, helping, doing good.   Diabetes mellitus without complication (HCC)    Type 2   Gout    History of miscarriage    Hypertension    Miscarriage    Ovarian cyst    Vitamin D  deficiency 10/02/2020    Patient Active Problem List   Diagnosis Date Noted   Missed abortion measuring around 10 weeks of gestation 11/23/2022   Asymptomatic bacteriuria during pregnancy in first trimester 11/10/2022   Supervision of high-risk pregnancy 10/29/2022   Morbid obesity (HCC) 10/02/2020   History of IUFD 11/10/2019   Chronic hypertension during pregnancy, antepartum 08/27/2019   Type 2 diabetes mellitus with microalbuminuria, with long-term current use of insulin  (HCC) 06/26/2019   Bicornuate uterus 11/01/2017   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    Past Surgical History:  Procedure Laterality Date   WISDOM TOOTH EXTRACTION      OB History     Gravida  8   Para  1   Term      Preterm  1   AB  5   Living  0      SAB  5   IAB      Ectopic      Multiple  0   Live Births               Home Medications    Prior to Admission medications  Medication Sig Start Date End Date Taking? Authorizing Provider  predniSONE  (DELTASONE ) 20 MG tablet Take 2 tablets (40 mg total) by mouth daily for 5 days. 01/27/24 02/01/24 Yes Arlyss Leita BRAVO, PA-C  aspirin  EC 81 MG tablet Take 81 mg by mouth daily. Swallow whole.     [provider]  metFORMIN  (GLUCOPHAGE -XR) 500 MG 24 hr tablet Take 2 tablets (1,000 mg total) by mouth in the morning and at bedtime. 09/09/23   Shamleffer, Ibtehal Jaralla, MD  naproxen  (NAPROSYN ) 500 MG tablet Take 1 tablet (500 mg total) by mouth 2 (two) times daily with a meal. 10/23/23   Hawks, Bari LABOR, FNP  ondansetron  (ZOFRAN -ODT) 4 MG disintegrating tablet Take 1 tablet (4 mg total) by mouth every 8 (eight) hours as needed for nausea or vomiting. 02/17/23   Chandra Harlene LABOR, NP  tirzepatide  (MOUNJARO ) 2.5 MG/0.5ML Pen Inject 2.5 mg into the skin once a week. 09/09/23   Shamleffer, Ibtehal Jaralla, MD  tirzepatide  (MOUNJARO ) 5 MG/0.5ML Pen Inject 5 mg into the skin once a week. 09/09/23   Shamleffer, Ibtehal Jaralla, MD  diphenhydrAMINE  (BENADRYL ) 25 mg capsule Take 25 mg by mouth every 6 (six) hours as needed for allergies (and allergic reactions).   12/20/18  [provider]    Family History Family History  Problem Relation Age of Onset   Hypertension Mother    Gout Mother    Diabetes Father  Hypertension Father    Stroke Father    Breast cancer Maternal Grandmother    Hypertension Maternal Grandfather    Heart attack Maternal Grandfather    Stroke Paternal Grandmother    Diabetes Paternal Grandfather    Hypertension Maternal Aunt    Breast cancer Maternal Aunt    Cancer Other    Hypertension Other    Diabetes Other     Social History Social History[1]   Allergies   Apple fruit extract, Banana, Other, Shellfish allergy, Tomato, Watermelon [citrullus vulgaris], Bactrim  [sulfamethoxazole -trimethoprim ], and Sulfa  antibiotics   Review of Systems Review of Systems  Musculoskeletal:        R ankle pain     Physical Exam Triage Vital Signs ED Triage Vitals  Encounter Vitals Group     BP      Girls Systolic BP Percentile      Girls Diastolic BP Percentile      Boys Systolic BP Percentile      Boys Diastolic BP Percentile      Pulse       Resp      Temp      Temp src      SpO2      Weight      Height      Head Circumference      Peak Flow      Pain Score      Pain Loc      Pain Education      Exclude from Growth Chart    No data found.  Updated Vital Signs BP (!) 135/104 (BP Location: Right Arm)   Pulse 95   Temp 98.9 F (37.2 C) (Oral)   Resp 20   LMP 12/03/2023 (Exact Date)   SpO2 98%   Visual Acuity Right Eye Distance:   Left Eye Distance:   Bilateral Distance:    Right Eye Near:   Left Eye Near:    Bilateral Near:     Physical Exam Vitals reviewed.  Constitutional:      General: She is not in acute distress.    Appearance: Normal appearance. She is not ill-appearing.  HENT:     Head: Normocephalic and atraumatic.  Pulmonary:     Effort: Pulmonary effort is normal.  Musculoskeletal:     Comments: R ankle: trace effusion, and exquisite tenderness, over the lateral malleolus, extending to the 5th metatarsal mid-shaft. No midfoot tenderness. No medial malleolar tenderness. DP 2+, cap refill <2 seconds, foot is warm.   Neurological:     General: No focal deficit present.     Mental Status: She is alert and oriented to person, place, and time.  Psychiatric:        Mood and Affect: Mood normal.        Behavior: Behavior normal.        Thought Content: Thought content normal.        Judgment: Judgment normal.      UC Treatments / Results  Labs (all labs ordered are listed, but only abnormal results are displayed) Labs Reviewed - No data to display  EKG   Radiology No results found.  Procedures Procedures (including critical care time)  Medications Ordered in UC Medications - No data to display  Initial Impression / Assessment and Plan / UC Course  I have reviewed the triage vital signs and the nursing notes.  Pertinent labs & imaging results that were available during my care of the patient were reviewed by me and  considered in my medical decision making (see chart for  details).     Patient is a pleasant 34 y.o. female presenting with gout - R ankle - following eating red meat.. The patient is afebrile and nontachycardic.  Antipyretic has not been administered today. [redacted] weeks pregnant. Diabetes is fairly well controlled, not insulin  dependent, A1c was 6.5 today at endocrinology. Prednisone  burst sent. Discussed low-purine diet. F/u with rheum as scheduled 02/07/24.   Final Clinical Impressions(s) / UC Diagnoses   Final diagnoses:  Other secondary acute gout of right ankle  [redacted] weeks gestation of pregnancy  Type 2 diabetes mellitus without complication, without long-term current use of insulin  (HCC)     Discharge Instructions      -Prednisone  two pills taken at the same time, with food, for 5 days -Tylenol  for pain -Limit red meat, red sauce  -Follow-up with rheumatologist on 02/07/23 as scheduled      ED Prescriptions     Medication Sig Dispense Auth. Provider   predniSONE  (DELTASONE ) 20 MG tablet Take 2 tablets (40 mg total) by mouth daily for 5 days. 10 tablet Maddie Brazier E, PA-C      PDMP not reviewed this encounter.     [1]  Social History Tobacco Use   Smoking status: Former   Smokeless tobacco: Never   Tobacco comments:    quit 2014  Vaping Use   Vaping status: Never Used  Substance Use Topics   Alcohol use: Not Currently    Comment: occas   Drug use: Never     Arlyss Leita BRAVO, PA-C 01/27/24 1121  "

## 2024-01-27 NOTE — ED Triage Notes (Signed)
 Pt reports right ankle pain and swelling un able to place weight on the right ankle. X 1 day

## 2024-01-27 NOTE — Progress Notes (Signed)
 "   Name: Nina Phillips  Age/ Sex: 34 y.o., female   MRN/ DOB: [id number], 12/18/1990     PCP: Prentiss Dorothyann Maxwell, MD (Inactive)   Reason for Endocrinology Evaluation: Type 2 Diabetes Mellitus  Initial Endocrine Consultative Visit:  09/08/2018    PATIENT IDENTIFIER: Nina Phillips is a 34 y.o. female with a past medical history of T2DM. The patient has followed with Endocrinology clinic since 09/08/2018 for consultative assistance with management of her diabetes.  DIABETIC HISTORY:  Nina Phillips was diagnosed with T2 DM in 06/2018. Her hemoglobin A1c has ranged from 10.6% on her initial diagnosis.  On her initial visit to our clinic she had an A1c of 10.3%, she was on Lantus  only, we started Metformin . Invokana  caused yeast infection .   She had ~ 5 miscarriages in 2020, and again in 2021. Had fetal demise at 21 weeks  in 2022   Lives with boyfriend and his grandmother    Patient was lost to follow-up 12 clinic for approximately 16 months before her return in May, 2025, at that time her A1c was 6.0%, she was on metformin  and NovoLog  only, she has self discontinued basal insulin , with continued to hold off on basal insulin , and was provided with NovoLog  per correction scale   I prescribed Mounjaro  August, 2025 with an A1c of 6.0% and discontinued NovoLog  as she has not been taking it and an A1c of 6.0%  SUBJECTIVE:   During the last visit (09/09/2023): A1c 6.0%   Today (01/27/2024): Nina Phillips is here for a follow up on diabetes. She checks glucose rarely.   She is in a wheelchair due to ankle pain that she attributes to gout  Right ankle pain and swelling noted 3 days ago. She is using tyelnol  She was on Mounjaro  2.5 mg weekly but she had no nausea or constipation or diarrhea    HOME DIABETES REGIMEN:  Metformin  500 tablet,2 tabs BID Mounjaro  5 mg weekly    METER DOWNLOAD SUMMARY:n/a   DIABETIC COMPLICATIONS: Microvascular complications:   Denies: CKD,  Neuropathy, retinopathy     Macrovascular complications:   Denies: CAD, CVA, PVD   HISTORY:  Past Medical History:  Past Medical History:  Diagnosis Date   Abnormal uterine bleeding (AUB) 07/09/2019   Anemia    Anxiety    Bicornate uterus    Depression    seeing a therapist since 3rd miscarriage, helping, doing good.   Diabetes mellitus without complication (HCC)    Type 2   Gout    History of miscarriage    Hypertension    Miscarriage    Ovarian cyst    Vitamin D  deficiency 10/02/2020   Past Surgical History:  Past Surgical History:  Procedure Laterality Date   WISDOM TOOTH EXTRACTION     Social History:  reports that she has quit smoking. She has never used smokeless tobacco. She reports that she does not currently use alcohol. She reports that she does not use drugs. Family History:  Family History  Problem Relation Age of Onset   Hypertension Mother    Gout Mother    Diabetes Father    Hypertension Father    Stroke Father    Breast cancer Maternal Grandmother    Hypertension Maternal Grandfather    Heart attack Maternal Grandfather    Stroke Paternal Grandmother    Diabetes Paternal Grandfather    Hypertension Maternal Aunt    Breast cancer Maternal Aunt    Cancer Other  Hypertension Other    Diabetes Other      HOME MEDICATIONS: Allergies as of 01/27/2024       Reactions   Apple Fruit Extract Hives   Banana Hives   Other Hives   Walnuts   Shellfish Allergy Hives   Tomato Hives   Watermelon [citrullus Vulgaris] Hives   Bactrim  [sulfamethoxazole -trimethoprim ] Hives, Rash   Sulfa  Antibiotics Hives, Rash        Medication List        Accurate as of January 27, 2024  9:02 AM. If you have any questions, ask your nurse or doctor.          STOP taking these medications    Dexcom G7 Sensor Misc Stopped by: Donell Butts, MD       TAKE these medications    amoxicillin -clavulanate 875-125 MG tablet Commonly known as:  AUGMENTIN  Take 1 tablet by mouth 2 (two) times daily.   aspirin  EC 81 MG tablet Take 81 mg by mouth daily. Swallow whole.   colchicine  0.6 MG tablet Take 2 tabs immediately, then 1 tab twice per day for the duration of the flare up to a max of 7 days (but discontinue for stomach pains or diarrhea)   metFORMIN  500 MG 24 hr tablet Commonly known as: GLUCOPHAGE -XR Take 2 tablets (1,000 mg total) by mouth in the morning and at bedtime.   naproxen  500 MG tablet Commonly known as: Naprosyn  Take 1 tablet (500 mg total) by mouth 2 (two) times daily with a meal.   ondansetron  4 MG disintegrating tablet Commonly known as: ZOFRAN -ODT Take 1 tablet (4 mg total) by mouth every 8 (eight) hours as needed for nausea or vomiting.   tirzepatide  5 MG/0.5ML Pen Commonly known as: MOUNJARO  Inject 5 mg into the skin once a week.   tirzepatide  2.5 MG/0.5ML Pen Commonly known as: MOUNJARO  Inject 2.5 mg into the skin once a week.         OBJECTIVE:   Vital Signs: BP (!) 168/90   Ht 5' 3 (1.6 m)   Wt 263 lb (119.3 kg)   BMI 46.59 kg/m   Wt Readings from Last 3 Encounters:  01/27/24 263 lb (119.3 kg)  09/09/23 263 lb (119.3 kg)  06/03/23 258 lb (117 kg)     Exam: General: Pt appears well and is in NAD  Lungs: Clear with good BS bilat   Heart: RRR   Extremities: No pretibial edema  Neuro: MS is good with appropriate affect, pt is alert and Ox3    DM foot exam: 06/03/2023    The skin of the feet is intact without sores or ulcerations. The pedal pulses are 2+ on right and 2+ on left. The sensation is intact to a screening 5.07, 10 gram monofilament bilaterally     DATA REVIEWED:  Lab Results  Component Value Date   HGBA1C 6.0 (A) 09/09/2023   HGBA1C 6.0 (A) 06/03/2023   HGBA1C 6.3 (H) 05/10/2022    Latest Reference Range & Units 11/29/22 19:23  Sodium 135 - 145 mmol/L 136  Potassium 3.5 - 5.1 mmol/L 3.8  Chloride 98 - 111 mmol/L 106  CO2 22 - 32 mmol/L 20 (L)  Glucose  70 - 99 mg/dL 895 (H)  BUN 6 - 20 mg/dL 10  Creatinine 9.55 - 8.99 mg/dL 9.03  Calcium  8.9 - 10.3 mg/dL 9.3  Anion gap 5 - 15  10  Alkaline Phosphatase 38 - 126 U/L 79  Albumin 3.5 - 5.0 g/dL 2.8 (L)  AST 15 - 41 U/L 12 (L)  ALT 0 - 44 U/L 12  Total Protein 6.5 - 8.1 g/dL 7.1  Total Bilirubin <8.7 mg/dL 0.4  GFR, Estimated >39 mL/min >60     ASSESSMENT / PLAN / RECOMMENDATIONS:   1) Type 2 Diabetes Mellitus, Optimally Controlled, With microalbuminuria  - Most recent A1c of 6.5 %. Goal A1c < 6.5 %.    -A1c remains optimal - Intolerant to SGLT-2 inhibitors due to recurrent yeast infection and hx of DKA  -She has stopped continued basal insulin  in 2025 and we opted not to start since her A1c was 6.0% -Will remove NovoLog  from her medication list in 2025 as she has not been using it and her A1c was 6.0% - I have prescribed Mounjaro  for her, but she has not been able to obtain it from the pharmacy as she did not have any insurance.  - She currently has health insurance, and will be able to obtain Mounjaro  through the pharmacy, she was provided again with#4 Mounjaro  pens samples    MEDICATIONS: - Continue  Metformin  500 mg, 2 tabs with Breakfast and two tabs with supper  -Stop NovoLog   - Start Mounjaro  2.5 mg weekly for 4 weeks, then increase to 5 mg weekly   EDUCATION / INSTRUCTIONS: BG monitoring instructions: Patient is instructed to check her blood sugars 1 times a day Call [facility] if: BG persistently < 70  I reviewed the Rule of 15 for the treatment of hypoglycemia in detail with the patient. Literature supplied.     2) Diabetic complications:  Eye: Does not have known diabetic retinopathy.  Neuro/ Feet: Does not have known diabetic peripheral neuropathy. Renal: Patient does not have known baseline CKD.      3) Dyslipidemia :  - Lipid panel is improving - Will encourage lifestyle changes in the past   4) Microalbuminuria :  - This was  elevated in 2022, repeat today shows worsening  - I had attempted to prescribe losartan  in the past but this was discontinued when she became pregnant - Unable to check today as she is in a wheel chair   5) Gout :  - The patient has been noted with swelling and tenderness of the right ankle, there is no erythema, and from physical exam today this is not typical of gout for me.  Since this is beyond the scope of endocrinology I have referred her to rheumatology - Per patient she has been diagnosed with gout since the age of 22, no maintenance therapy - Patient is advised to use Tylenol  (as NSAIDs will worsen BP), ice and leg elevation   6) Elevated Blood Pressure:   -Patient is symptomatic - She had refused this to pain due to ankle gout - Patient checks BP at home and it is within normal range   F/U in 6 months    Signed electronically by: Stefano Redgie Butts, MD  [facility]  [facility] [address] [place] [place], [place] [address] Phone: [phone] FAX: [phone]   CC: Prentiss Dorothyann Maxwell, MD (Inactive) [address] [place] [address] Phone: [phone]  Fax: [phone]  Return to Endocrinology clinic as below: Future Appointments  Date Time Provider Department Center  02/03/2024  9:30 AM [facility] NURSE [facility]       "

## 2024-01-27 NOTE — Discharge Instructions (Addendum)
-  Prednisone  two pills taken at the same time, with food, for 5 days -Tylenol  for pain -Limit red meat, red sauce  -Follow-up with rheumatologist on 02/07/24 as scheduled

## 2024-02-03 ENCOUNTER — Ambulatory Visit: Payer: Self-pay | Admitting: *Deleted

## 2024-02-03 DIAGNOSIS — Z3201 Encounter for pregnancy test, result positive: Secondary | ICD-10-CM | POA: Diagnosis not present

## 2024-02-03 LAB — POCT URINE PREGNANCY: Preg Test, Ur: POSITIVE — AB

## 2024-02-03 NOTE — Progress Notes (Signed)
" ° °  NURSE VISIT- PREGNANCY CONFIRMATION   SUBJECTIVE:  Nina Phillips is a 34 y.o. G12P0150 female at [redacted]w[redacted]d by certain LMP of Patient's last menstrual period was 12/04/2023 (exact date). Here for pregnancy confirmation.  Home pregnancy test: positive x 2  She reports nausea.  She is taking prenatal vitamins.    OBJECTIVE:  LMP 12/04/2023 (Exact Date)   Appears well, in no apparent distress  No results found for this or any previous visit (from the past 24 hours).  ASSESSMENT: Positive pregnancy test, [redacted]w[redacted]d by LMP    PLAN: Schedule for dating ultrasound in next available  Prenatal vitamins: continue   Nausea medicines: not currently needed   OB packet given: Yes  Alan LITTIE Fischer  02/03/2024 9:37 AM  "

## 2024-02-06 ENCOUNTER — Other Ambulatory Visit: Payer: Self-pay | Admitting: Obstetrics & Gynecology

## 2024-02-06 DIAGNOSIS — O3680X0 Pregnancy with inconclusive fetal viability, not applicable or unspecified: Secondary | ICD-10-CM

## 2024-02-06 DIAGNOSIS — O10919 Unspecified pre-existing hypertension complicating pregnancy, unspecified trimester: Secondary | ICD-10-CM

## 2024-02-06 DIAGNOSIS — O2620 Pregnancy care for patient with recurrent pregnancy loss, unspecified trimester: Secondary | ICD-10-CM

## 2024-02-06 DIAGNOSIS — Q513 Bicornate uterus: Secondary | ICD-10-CM

## 2024-02-06 DIAGNOSIS — O24119 Pre-existing diabetes mellitus, type 2, in pregnancy, unspecified trimester: Secondary | ICD-10-CM

## 2024-02-07 ENCOUNTER — Ambulatory Visit: Admitting: Adult Health

## 2024-02-07 ENCOUNTER — Other Ambulatory Visit: Admitting: Radiology

## 2024-02-07 ENCOUNTER — Ambulatory Visit: Admitting: Internal Medicine

## 2024-02-07 ENCOUNTER — Encounter: Payer: Self-pay | Admitting: Adult Health

## 2024-02-07 VITALS — BP 135/83 | HR 83 | Ht 62.0 in | Wt 256.5 lb

## 2024-02-07 DIAGNOSIS — O24119 Pre-existing diabetes mellitus, type 2, in pregnancy, unspecified trimester: Secondary | ICD-10-CM

## 2024-02-07 DIAGNOSIS — O10919 Unspecified pre-existing hypertension complicating pregnancy, unspecified trimester: Secondary | ICD-10-CM

## 2024-02-07 DIAGNOSIS — R030 Elevated blood-pressure reading, without diagnosis of hypertension: Secondary | ICD-10-CM | POA: Insufficient documentation

## 2024-02-07 DIAGNOSIS — O039 Complete or unspecified spontaneous abortion without complication: Secondary | ICD-10-CM | POA: Insufficient documentation

## 2024-02-07 DIAGNOSIS — Z3A00 Weeks of gestation of pregnancy not specified: Secondary | ICD-10-CM

## 2024-02-07 DIAGNOSIS — I10 Essential (primary) hypertension: Secondary | ICD-10-CM | POA: Diagnosis not present

## 2024-02-07 DIAGNOSIS — Q513 Bicornate uterus: Secondary | ICD-10-CM | POA: Diagnosis not present

## 2024-02-07 DIAGNOSIS — O3680X0 Pregnancy with inconclusive fetal viability, not applicable or unspecified: Secondary | ICD-10-CM

## 2024-02-07 DIAGNOSIS — O2620 Pregnancy care for patient with recurrent pregnancy loss, unspecified trimester: Secondary | ICD-10-CM

## 2024-02-07 DIAGNOSIS — O3400 Maternal care for unspecified congenital malformation of uterus, unspecified trimester: Secondary | ICD-10-CM

## 2024-02-07 NOTE — Progress Notes (Signed)
" °  Subjective:     Patient ID: Nina Phillips, female   DOB: 09-09-1990, 34 y.o.   MRN: [id number]  HPI Nina Phillips is a 34 year old black female, married, G8P0150 in for dating US , has bicornuate uterus and has pregnancy in left horn, no FHM,. CRL 16.4 mm, GS 29 mm and YS 3.4 mm.     Component Value Date/Time   DIAGPAP  12/10/2022 1022    - Negative for Intraepithelial Lesions or Malignancy (NILM)   DIAGPAP - Benign reactive/reparative changes 12/10/2022 1022   DIAGPAP  12/26/2019 1000    - Negative for intraepithelial lesion or malignancy (NILM)   HPVHIGH Negative 12/10/2022 1022   ADEQPAP Satisfactory for evaluation.  The absence of an 12/10/2022 1022   ADEQPAP  12/10/2022 1022    endocervical/transformation zone component is not uncommon in pregnant   ADEQPAP patients. 12/10/2022 1022    PCP is Dr Prentiss Review of Systems Failed pregnancy Denies any bleeding  Reviewed past medical,surgical, social and family history. Reviewed medications and allergies.     Objective:   Physical Exam BP 135/83 (BP Location: Right Arm, Patient Position: Sitting, Cuff Size: Large)   Pulse 83   Ht 5' 2 (1.575 m)   Wt 256 lb 8 oz (116.3 kg)   LMP 12/04/2023 (Exact Date)   BMI 46.91 kg/m     Discussed US  results with her, she does not want cytotec , wants D&C.  Upstream - 02/07/24 1723       Pregnancy Intention Screening   Does the patient want to become pregnant in the next year? N/A    Does the patient's partner want to become pregnant in the next year? N/A    Would the patient like to discuss contraceptive options today? No      Contraception Wrap Up   Current Method Pregnant/Seeking Pregnancy    End Method Pregnant/Seeking Pregnancy    Contraception Counseling Provided No          Assessment:     1. Miscarriage (Primary) Failed pregnancy, she is upset, and teary Told her I was sorry for her loss and hugged her  She does not want Cytotec , wants D&C Will talk with Dr Jayne in am,  and call her at [phone]  2. Bicornuate uterus   3. Hypertension, unspecified type She says she doesn't have hypertension,  BP better on recheck     Plan:     Follow up TBD     "

## 2024-02-07 NOTE — Progress Notes (Addendum)
 US : GA = 9+2 weeks Hx Habitual aborter,   Hx Bicornuate Uterus, T2DM, CHTN  Anteverted uterus with single non viabile early IUP.  GS intact within upper Left horn of uterus.   CRL = 16.4 mm = 8+0 weeks,  avascular embryo, no heart motion, Yolk Sac =3.63mm, Gestational Sac = 29 mm = 7+4 weeks Normal ov's,  Left ovary with CL = 17 x 15 mm - Neg adnexal regions - neg CDS - no free fluid present  Bicornuate Uterus with 8 week MAB within Left Horn  Trans-abdominal and Trans-vaginal exams performed Chaperone: Aleck

## 2024-02-08 ENCOUNTER — Other Ambulatory Visit (HOSPITAL_COMMUNITY): Payer: Self-pay | Admitting: Obstetrics and Gynecology

## 2024-02-08 ENCOUNTER — Other Ambulatory Visit: Payer: Self-pay | Admitting: Obstetrics & Gynecology

## 2024-02-08 ENCOUNTER — Telehealth: Payer: Self-pay

## 2024-02-08 ENCOUNTER — Telehealth: Payer: Self-pay | Admitting: Adult Health

## 2024-02-08 DIAGNOSIS — O021 Missed abortion: Secondary | ICD-10-CM

## 2024-02-08 DIAGNOSIS — Z01818 Encounter for other preprocedural examination: Secondary | ICD-10-CM

## 2024-02-08 NOTE — Telephone Encounter (Signed)
 I called to see if patient would like to have surgery scheduled on 02/10/24 at 11 am? I left a voicemail providing surgery details and advising I would secure the time to avoid losing the space on Friday. I asked patient to call me back to confirm or deny the surgery date.

## 2024-02-08 NOTE — Telephone Encounter (Signed)
 Pt aware that I spoke with Dr Jayne, he could get her on his scheduled for 02/14/24 or will get in touch with colleagues in [place], to get in sooner if possible and she wants [place] and sooner, will let Dr Jayne know

## 2024-02-09 ENCOUNTER — Encounter (HOSPITAL_COMMUNITY): Payer: Self-pay | Admitting: Obstetrics and Gynecology

## 2024-02-09 NOTE — Progress Notes (Signed)
 "  Office Visit Note  Patient: Nina Phillips             Date of Birth: Nov 16, 1990           MRN: [id number]             PCP: Prentiss Dorothyann Maxwell, MD (Inactive) Referring: Sam Donell Cardinal, MD Visit Date: 02/17/2024 Occupation: Certified Medical Assistant  Subjective:  Recurrent gout flare  History of Present Illness: Nina Phillips is a 34 y.o. female seen for the evaluation of gout.  According the patient her symptoms started at age 72 with right ankle joint pain and difficulty walking.  She states she was seen at the urgent care where she was found to have elevated uric acid.  She was treated with prednisone  and colchicine  and the flare resolved.  Since then she has had flares every 2 months.  She states she mostly go to the urgent care or does a e-visit.  She is treated with colchicine  and steroids as needed.  She recalls being on allopurinol at 1 time but then she stopped it.  She states her last episode was 2 weeks ago in her right ankle which was treated with colchicine .  She states she gets gout flares in her great toes, ankles and her fingers.  She is not having any pain in between the gout flares.  She also gets indomethacin  at times for the gout flares.  She has not seen a PCP for the management of gout. Patient denies any history of oral ulcers, nasal ulcers, malar rash, , Raynaud's or lymphadenopathy.  She gives history of photosensitivity. She is ambidextrous, works as a LAWYER at [employer].  She enjoys cooking and walks for exercise.  She also enjoys playing sports with her nephew.  She is divorced and is with her fianc now.  Contraception is condoms only.  She is gravida 14, para 0 and miscarriages 14.  She states her last miscarriage was last week.  There is no history of DVTs.  She drinks alcohol occasionally which is about every 3 months.  It includes wine and wine coolers.  She quit smoking in 2021.  She started smoking cigars socially when she was 21. There is family  history of gout in her maternal grandfather, mother, father and brother.   Activities of Daily Living:  Patient reports morning stiffness for 0 minutes.   Patient Reports nocturnal pain.  Difficulty dressing/grooming: Denies Difficulty climbing stairs: Denies Difficulty getting out of chair: Denies Difficulty using hands for taps, buttons, cutlery, and/or writing: Denies  Review of Systems  Constitutional:  Positive for fatigue.  HENT:  Negative for mouth sores and mouth dryness.   Eyes:  Negative for dryness.  Respiratory:  Negative for shortness of breath.   Cardiovascular:  Negative for chest pain and palpitations.  Gastrointestinal:  Negative for blood in stool, constipation and diarrhea.  Endocrine: Negative for increased urination.  Genitourinary:  Negative for involuntary urination.  Musculoskeletal:  Positive for joint pain and joint pain. Negative for gait problem, joint swelling, myalgias, muscle weakness, morning stiffness, muscle tenderness and myalgias.  Skin:  Positive for sensitivity to sunlight. Negative for color change, rash and hair loss.  Allergic/Immunologic: Negative for susceptible to infections.  Neurological:  Positive for headaches. Negative for dizziness.  Hematological:  Negative for swollen glands.  Psychiatric/Behavioral:  Positive for depressed mood and sleep disturbance. The patient is not nervous/anxious.     PMFS History:  Patient Active Problem List   Diagnosis  Date Noted   Miscarriage 02/07/2024   Elevated BP without diagnosis of hypertension 02/07/2024   Hypertension 02/07/2024   Missed abortion measuring around 10 weeks of gestation 11/23/2022   Asymptomatic bacteriuria during pregnancy in first trimester 11/10/2022   Supervision of high-risk pregnancy 10/29/2022   Morbid obesity (HCC) 10/02/2020   History of IUFD 11/10/2019   Chronic hypertension during pregnancy, antepartum 08/27/2019   Type 2 diabetes mellitus with microalbuminuria,  with long-term current use of insulin  (HCC) 06/26/2019   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   Diabetes mellitus without complication (HCC)    Type 2   Gout    History of miscarriage    Hypertension    Miscarriage    Ovarian cyst    Pneumonia    Vitamin D  deficiency 10/02/2020    Family History  Problem Relation Age of Onset   Hypertension Mother    Gout Mother    Diabetes Father    Hypertension Father    Stroke Father    Hypertension Sister        during pregnancy   Hypertension Brother    Gout Brother    Hypertension Maternal Aunt    Breast cancer Maternal Aunt    Breast cancer Maternal Grandmother    Hypertension Maternal Grandfather    Heart attack Maternal Grandfather    Stroke Paternal Grandmother    Diabetes Paternal Grandfather    Cancer Other    Hypertension Other    Diabetes Other    Past Surgical History:  Procedure Laterality Date   DILATION AND EVACUATION N/A 02/10/2024   Procedure: DILATION AND EVACUATION FOR MANAGEMENT OF MISSED ABORTION IN THE FIRST TRIMESTER WITH ANORA TESTING KIT;  Surgeon: Erik Kieth BROCKS, MD;  Location: [facility] OR;  Service: Gynecology;  Laterality: N/A;   OPERATIVE ULTRASOUND N/A 02/10/2024   Procedure: ULTRASOUND INTRAOPERATIVE;  Surgeon: Erik Kieth BROCKS, MD;  Location: [facility] OR;  Service: Gynecology;  Laterality: N/A;   WISDOM TOOTH EXTRACTION     Social History[1] Social History   Social History Narrative   Not on file     Immunization History  Administered Date(s) Administered   Influenza,inj,Quad PF,6+ Mos 10/10/2019   Tdap 11/11/2019     Objective: Vital Signs: BP (!) 139/99 (BP Location: Right Arm, Patient Position: Sitting, Cuff Size:  Large)   Pulse 84   Temp 97.7 F (36.5 C)   Resp 17   Ht 5' 4 (1.626 m)   Wt 260 lb 3.2 oz (118 kg)   LMP 12/24/2023   BMI 44.66 kg/m    Physical Exam Vitals and nursing note reviewed.  Constitutional:      Appearance: She is well-developed.  HENT:     Head: Normocephalic and atraumatic.  Eyes:     Conjunctiva/sclera: Conjunctivae normal.  Cardiovascular:     Rate and Rhythm: Normal rate and regular rhythm.     Heart sounds: Normal heart sounds.  Pulmonary:     Effort: Pulmonary effort is normal.     Breath sounds: Normal breath sounds.  Abdominal:     General: Bowel sounds are normal.     Palpations: Abdomen is soft.  Musculoskeletal:     Cervical  back: Normal range of motion.  Lymphadenopathy:     Cervical: No cervical adenopathy.  Skin:    General: Skin is warm and dry.     Capillary Refill: Capillary refill takes less than 2 seconds.  Neurological:     Mental Status: She is alert and oriented to person, place, and time.  Psychiatric:        Behavior: Behavior normal.      Musculoskeletal Exam: Cervical, thoracic and lumbar spine were in good range of motion.  There was no SI joint tenderness.  Shoulder joints, elbow joints, wrist joints, MCPs, PIPs and DIPs were in good range of motion with no synovitis.  Hip joints and knee joints were in good range of motion without any warmth swelling or effusion.  There was no tenderness over ankles or MTPs.   CDAI Exam: CDAI Score: -- Patient Global: --; Provider Global: -- Swollen: --; Tender: -- Joint Exam 02/17/2024   No joint exam has been documented for this visit   There is currently no information documented on the homunculus. Go to the Rheumatology activity and complete the homunculus joint exam.  Investigation: No additional findings.  Imaging: US  OB Comp Less 14 Wks Result Date: 02/12/2024 Table formatting from the original result was not included. Images from the original result were not included.  ..an  [employer] [facility] TECHNICAL SALES ENGINEER) accredited practice Center for [facility] [address] [place] [address] Ordering Provider: Ozan, Jennifer, DO                      DATING AND VIABILITY SONOGRAM Dorcas Melito is a 34 y.o. year old [id number] with LMP Patient's last menstrual period was 12/04/2023 (exact date). which would correlate to  [redacted]w[redacted]d weeks gestation.  She has regular menstrual cycles.   She is here today for a confirmatory initial sonogram. EXAM: TRANSABDOMINAL AND TRANSVAGINAL ULTRASOUND OF PELVIS  TECHNIQUE: Transabdominal and transvaginal ultrasound examinations of the pelvis were performed. Transabdominal technique was performed for global imaging of the pelvis including uterus, ovaries, adnexal regions, and pelvic cul-de-sac. It was necessary to proceed with endovaginal exam following the transabdominal exam to better visualize the uterus, endometrium and bilateral ovaries in addition to the early pregnancy. GESTATION: SINGLETON  Bicornuate Uterus Hx Habitual aborter,   Hx Bicornuate Uterus, T2DM, CHTN Anteverted Bicornuate uterus with single non viabile early IUP.  GS intact within upper Left horn of uterus.  CRL = 16.4 mm = 8+0 weeks,  avascular embryo, no heart motion, Yolk Sac =3.23mm, Gestational Sac = 29 mm = 7+4 weeks Normal ov's,  Left ovary with CL = 17 x 15 mm - Neg adnexal regions - neg CDS - no free fluid present Trans-abdominal and Trans-vaginal exams performed Chaperone: Caroline FETAL ACTIVITY:       No heart motion,  avascular embryo          The fetus is inactive. CERVIX: Appears closed ADNEXA: The ovaries are normal.  Left ov CL = 17 x 15 mm GESTATIONAL AGE AND  BIOMETRICS: Gestational criteria: Estimated Date of Delivery: 09/09/24 by LMP now at [redacted]w[redacted]d Previous Scans:0 GESTATIONAL SAC           29 mm         7+4 weeks CROWN RUMP LENGTH           16.4 mm         8+0 weeks  AVERAGE EGA(BY THIS SCAN):  8+0 weeks Bicornuate uterus with 8 week MAB Left Horn  TECHNICIAN COMMENTS: US : GA = 9+2 weeks Hx Bicornuate Uterus, T2DM, CHTN Anteverted uterus with single non viabile early IUP.  GS intact within upper Left horn of uterus.  CRL = 16.4 mm = 8+0 weeks,  avascular embryo, no heart motion, Yolk Sac =3.30mm, Gestational Sac = 29 mm = 7+4 weeks Normal ov's,  Left ovary with CL = 17 x 15 mm - Neg adnexal regions - neg CDS - no free fluid present Bicornuate Uterus with 8 week MAB within Left Horn Trans-abdominal and Trans-vaginal exams performed Chaperone: Aleck A copy of this report including all images has been saved and backed up to a second source for retrieval if needed. All measures and details of the anatomical scan, placentation, fluid volume and pelvic anatomy are contained in that report. Sharlet GORMAN Fair 02/07/2024 5:25 PM TECHNIQUE: Both transabdominal and transvaginal ultrasound examinations of the pelvis were performed. Transabdominal technique was performed for global imaging of the pelvis including uterus, ovaries, adnexal regions, and pelvic cul-de-sac. It was necessary to proceed with endovaginal exam following the transabdominal exam to visualize the uterus. Clinical Impression: Early pregnancy loss, miscarriage, with retention of the embryo Normal general sonographic findings  This recommendation follows SRU consensus guidelines: Diagnostic Criteria for Nonviable Pregnancy Early in the First Trimester. LOISE Diedra PARAS Med 2013; 630:8556-48.  Delon HERO Ozan 02/12/2024 1:10 PM  US  Intraoperative Result Date: 02/10/2024 CLINICAL DATA:  Ultrasound was provided for use by the ordering physician.  No provider Interpretation or professional fees incurred.     Recent Labs: Lab Results  Component Value Date   WBC 9.3 02/10/2024   HGB 10.4 (L) 02/10/2024   PLT 486 (H) 02/10/2024   NA 136 02/10/2024   K 3.8 02/10/2024   CL 102 02/10/2024   CO2 22 02/10/2024   GLUCOSE 108 (H)  02/10/2024   BUN 10 02/10/2024   CREATININE 0.96 02/10/2024   BILITOT 0.3 06/03/2023   ALKPHOS 79 11/29/2022   AST 11 06/03/2023   ALT 10 06/03/2023   PROT 7.5 06/03/2023   ALBUMIN 2.8 (L) 11/29/2022   CALCIUM  9.3 02/10/2024   GFRAA 103 08/06/2019    Speciality Comments: No specialty comments available.  Procedures:  No procedures performed Allergies: Apple fruit extract, Banana, Other, Shellfish allergy, Tomato, Watermelon [citrullus vulgaris], Bactrim  [sulfamethoxazole -trimethoprim ], and Sulfa  antibiotics   Assessment / Plan:     Visit Diagnoses: Idiopathic chronic gout of multiple sites without tophus -patient gives history of recurrent gout since she was 34 years old.  She states the episodes happen every 2 months.  Most episodes have been in her ankles and her toes.  She has had occasional episodes in her fingers.  She states the last episode of gout was about 2 weeks ago in her right ankle.  She has been treated mostly with colchicine , Indocin  or prednisone .  She recalls taking allopurinol 1 time and then she discontinued.  She goes to urgent care or does e-visits for her gout episodes.  She has not seen her PCP.  Detailed counseling regarding gout was provided.  A handout was given.  Dietary modifications and weight loss was discussed.  I will check uric acid level today.  If her uric acid is elevated we may place her on colchicine  and allopurinol.  Side effects of both medications were discussed and a handout was placed in the AVS.  Patient voiced understanding.  Plan: Magnesium , Uric acid  Chronic inflammatory arthritis -she gives history of recurrent chronic inflammation in her joints.  I will obtain following labs.  Plan: Sedimentation rate, Rheumatoid factor, Cyclic citrul peptide antibody, IgG  Pain in both hands -she gives history of intermittent swelling in her hands.  Plan: XR Hand 2 View Right, XR Hand 2 View Left.  X-rays of bilateral hands were unremarkable.  Pain in both  feet -she history of intermittent swelling in her ankles and her feet.  Plan: XR Foot 2 Views Right, XR Foot 2 Views Left left first MTP hallux valgus deformity was noted.  Dorsal spurring was noted.  Other fatigue -he does history of chronic fatigue.  Plan: CBC with Differential/Platelet, Comprehensive metabolic panel with GFR  Type 2 diabetes mellitus with microalbuminuria, with long-term current use of insulin  (HCC)-she is on metformin  and tirzepatide .  Primary hypertension-blood pressure was elevated at 139/99.  Patient states her blood pressure is high mostly at the doctor's offices.  History of recurrent miscarriages -3 of 14 miscarriages -most in first trimester and 1 in second trimester -will obtain labs.  Plan: Beta-2 glycoprotein antibodies, Cardiolipin antibodies, IgG, IgM, IgA, Lupus Anticoagulant Eval w/Reflex, C3 and C4, Anti-DNA antibody, double-stranded, ANA, RNP Antibody, Anti-Smith antibody, Sjogrens syndrome-A extractable nuclear antibody, Sjogrens syndrome-B extractable nuclear antibody  Bicornuate uterus  Family history of gout-maternal grandfather, mother, brother   Morbid obesity (HCC)-she is on Mounjaro .  Weight loss diet and exercise was discussed.  Association of obesity with gout was also discussed.  Orders: Orders Placed This Encounter  Procedures   XR Hand 2 View Right   XR Hand 2 View Left   XR Foot 2 Views Right   XR Foot 2 Views Left   CBC with Differential/Platelet   Comprehensive metabolic panel with GFR   Sedimentation rate   Magnesium    Uric acid   Rheumatoid factor   Cyclic citrul peptide antibody, IgG   Beta-2 glycoprotein antibodies   Cardiolipin antibodies, IgG, IgM, IgA   Lupus Anticoagulant Eval w/Reflex   C3 and C4   Anti-DNA antibody, double-stranded   ANA   RNP Antibody   Anti-Smith antibody   Sjogrens syndrome-A extractable nuclear antibody   Sjogrens syndrome-B extractable nuclear antibody   No orders of the defined types were  placed in this encounter.    Follow-Up Instructions: Return for Chronic inflammatory arthritis.   Maya Nash, MD  Note - This record has been created using Animal nutritionist.  Chart creation errors have been sought, but may not always  have been located. Such creation errors do not reflect on  the standard of medical care.     [1]  Social History Tobacco Use   Smoking status: Former    Passive exposure: Current   Smokeless tobacco: Never   Tobacco comments:    quit 2014  Vaping Use   Vaping status: Never Used  Substance Use Topics   Alcohol use: Yes    Comment: occas   Drug use: Never   "

## 2024-02-09 NOTE — Progress Notes (Signed)
 Patient was called to be informed that the surgery time for tomorrow was changed to 15:16. Patient was instructed to be at the hospital at 12:45. Patient verbalized understanding.

## 2024-02-09 NOTE — Progress Notes (Signed)
 Spoke w/ via phone for pre-op interview--- Nina Phillips needs dos---- CBC and T&S per surgeon. CBG, BMP per anesthesia.         Phillips results------Current A1C in Epic 6.5 dated 01/27/24. COVID test -----patient states asymptomatic no test needed Arrive at -------0900 NPO after MN NO Solid Food.  Clear liquids from MN until---0800 Pre-Surgery Ensure or G2:  Med rec completed Medications to take morning of surgery -----NONE Diabetic medication -----  GLP1 agonist last dose: GLP1 instructions:  Patient instructed no nail polish to be worn day of surgery Patient instructed to bring photo id and insurance card day of surgery Patient aware to have Driver (ride ) / caregiver    for 24 hours after surgery - Fiance Nina Phillips Patient Special Instructions ----- Pre-Op special Instructions -----  Patient verbalized understanding of instructions that were given at this phone interview. Patient denies chest pain, sob, fever, cough at the interview.

## 2024-02-10 ENCOUNTER — Encounter (HOSPITAL_COMMUNITY): Payer: Self-pay | Admitting: Obstetrics and Gynecology

## 2024-02-10 ENCOUNTER — Ambulatory Visit (HOSPITAL_COMMUNITY): Payer: Self-pay | Admitting: Anesthesiology

## 2024-02-10 ENCOUNTER — Ambulatory Visit (HOSPITAL_COMMUNITY)
Admission: RE | Admit: 2024-02-10 | Discharge: 2024-02-10 | Disposition: A | Discharge status: Home / Self Care | Attending: Obstetrics and Gynecology | Admitting: Obstetrics and Gynecology

## 2024-02-10 ENCOUNTER — Other Ambulatory Visit: Payer: Self-pay

## 2024-02-10 ENCOUNTER — Ambulatory Visit (HOSPITAL_COMMUNITY)

## 2024-02-10 ENCOUNTER — Encounter (HOSPITAL_COMMUNITY): Admission: RE | Disposition: A | Payer: Self-pay | Source: Home / Self Care | Attending: Obstetrics and Gynecology

## 2024-02-10 DIAGNOSIS — Z7984 Long term (current) use of oral hypoglycemic drugs: Secondary | ICD-10-CM | POA: Insufficient documentation

## 2024-02-10 DIAGNOSIS — E119 Type 2 diabetes mellitus without complications: Secondary | ICD-10-CM | POA: Diagnosis not present

## 2024-02-10 DIAGNOSIS — Z87891 Personal history of nicotine dependence: Secondary | ICD-10-CM | POA: Insufficient documentation

## 2024-02-10 DIAGNOSIS — Z01818 Encounter for other preprocedural examination: Secondary | ICD-10-CM

## 2024-02-10 DIAGNOSIS — O021 Missed abortion: Secondary | ICD-10-CM

## 2024-02-10 DIAGNOSIS — D649 Anemia, unspecified: Secondary | ICD-10-CM | POA: Diagnosis not present

## 2024-02-10 DIAGNOSIS — Z3A08 8 weeks gestation of pregnancy: Secondary | ICD-10-CM | POA: Diagnosis not present

## 2024-02-10 DIAGNOSIS — N96 Recurrent pregnancy loss: Secondary | ICD-10-CM

## 2024-02-10 DIAGNOSIS — E1129 Type 2 diabetes mellitus with other diabetic kidney complication: Secondary | ICD-10-CM

## 2024-02-10 HISTORY — PX: DILATION AND EVACUATION: SHX1459

## 2024-02-10 HISTORY — PX: OPERATIVE ULTRASOUND: SHX5996

## 2024-02-10 HISTORY — DX: Pneumonia, unspecified organism: J18.9

## 2024-02-10 LAB — GLUCOSE, CAPILLARY
Glucose-Capillary: 106 mg/dL — ABNORMAL HIGH (ref 70–99)
Glucose-Capillary: 89 mg/dL (ref 70–99)
Glucose-Capillary: 90 mg/dL (ref 70–99)

## 2024-02-10 LAB — TYPE AND SCREEN
ABO/RH(D): O POS
Antibody Screen: NEGATIVE

## 2024-02-10 LAB — CBC
HCT: 31.7 % — ABNORMAL LOW (ref 36.0–46.0)
Hemoglobin: 10.4 g/dL — ABNORMAL LOW (ref 12.0–15.0)
MCH: 25.5 pg — ABNORMAL LOW (ref 26.0–34.0)
MCHC: 32.8 g/dL (ref 30.0–36.0)
MCV: 77.7 fL — ABNORMAL LOW (ref 80.0–100.0)
Platelets: 486 K/uL — ABNORMAL HIGH (ref 150–400)
RBC: 4.08 MIL/uL (ref 3.87–5.11)
RDW: 16.9 % — ABNORMAL HIGH (ref 11.5–15.5)
WBC: 9.3 K/uL (ref 4.0–10.5)
nRBC: 0 % (ref 0.0–0.2)

## 2024-02-10 LAB — BASIC METABOLIC PANEL WITH GFR
Anion gap: 12 (ref 5–15)
BUN: 10 mg/dL (ref 6–20)
CO2: 22 mmol/L (ref 22–32)
Calcium: 9.3 mg/dL (ref 8.9–10.3)
Chloride: 102 mmol/L (ref 98–111)
Creatinine, Ser: 0.96 mg/dL (ref 0.44–1.00)
GFR, Estimated: >60 mL/min
Glucose, Bld: 108 mg/dL — ABNORMAL HIGH (ref 70–99)
Potassium: 3.8 mmol/L (ref 3.5–5.1)
Sodium: 136 mmol/L (ref 135–145)

## 2024-02-10 MED ORDER — CARBOPROST TROMETHAMINE 250 MCG/ML IM SOLN
INTRAMUSCULAR | Status: AC
  Filled 2024-02-10: qty 1

## 2024-02-10 MED ORDER — LIDOCAINE 2% (20 MG/ML) 5 ML SYRINGE
INTRAMUSCULAR | Status: AC
  Filled 2024-02-10: qty 5

## 2024-02-10 MED ORDER — AMISULPRIDE (ANTIEMETIC) 5 MG/2ML IV SOLN: 10.0000 mg | Freq: Once | INTRAVENOUS | Status: DC | PRN

## 2024-02-10 MED ORDER — DOXYCYCLINE HYCLATE 100 MG IV SOLR
200.0000 mg | Freq: Once | INTRAVENOUS | Status: AC
  Administered 2024-02-10: 200 mg via INTRAVENOUS
  Filled 2024-02-10: qty 200

## 2024-02-10 MED ORDER — DEXAMETHASONE SOD PHOSPHATE PF 10 MG/ML IJ SOLN
INTRAMUSCULAR | Status: AC
  Filled 2024-02-10: qty 1

## 2024-02-10 MED ORDER — PROPOFOL 10 MG/ML IV BOLUS
INTRAVENOUS | Status: AC
  Filled 2024-02-10: qty 20

## 2024-02-10 MED ORDER — KETOROLAC TROMETHAMINE 30 MG/ML IJ SOLN
INTRAMUSCULAR | Status: AC
  Filled 2024-02-10: qty 2

## 2024-02-10 MED ORDER — EPHEDRINE 5 MG/ML INJ
INTRAVENOUS | Status: AC
  Filled 2024-02-10: qty 5

## 2024-02-10 MED ORDER — LIDOCAINE 2% (20 MG/ML) 5 ML SYRINGE
INTRAMUSCULAR | Status: DC | PRN
  Administered 2024-02-10: 100 mg via INTRAVENOUS

## 2024-02-10 MED ORDER — ONDANSETRON HCL 4 MG/2ML IJ SOLN: 4.0000 mg | Freq: Once | INTRAMUSCULAR | Status: DC | PRN

## 2024-02-10 MED ORDER — LACTATED RINGERS IV SOLN: INTRAVENOUS | Status: DC

## 2024-02-10 MED ORDER — ORAL CARE MOUTH RINSE: 15.0000 mL | Freq: Once | OROMUCOSAL | Status: AC

## 2024-02-10 MED ORDER — MIDAZOLAM HCL (PF) 2 MG/2ML IJ SOLN
INTRAMUSCULAR | Status: DC | PRN
  Administered 2024-02-10: 2 mg via INTRAVENOUS

## 2024-02-10 MED ORDER — LIDOCAINE 2% (20 MG/ML) 5 ML SYRINGE
INTRAMUSCULAR | Status: AC
  Filled 2024-02-10: qty 10

## 2024-02-10 MED ORDER — PROPOFOL 10 MG/ML IV BOLUS
INTRAVENOUS | Status: DC | PRN
  Administered 2024-02-10: 200 mg via INTRAVENOUS

## 2024-02-10 MED ORDER — PHENYLEPHRINE 80 MCG/ML (10ML) SYRINGE FOR IV PUSH (FOR BLOOD PRESSURE SUPPORT)
PREFILLED_SYRINGE | INTRAVENOUS | Status: DC | PRN
  Administered 2024-02-10: 80 ug via INTRAVENOUS

## 2024-02-10 MED ORDER — FENTANYL CITRATE (PF) 100 MCG/2ML IJ SOLN
INTRAMUSCULAR | Status: AC
  Filled 2024-02-10: qty 2

## 2024-02-10 MED ORDER — PHENYLEPHRINE 80 MCG/ML (10ML) SYRINGE FOR IV PUSH (FOR BLOOD PRESSURE SUPPORT)
PREFILLED_SYRINGE | INTRAVENOUS | Status: AC
  Filled 2024-02-10: qty 10

## 2024-02-10 MED ORDER — ONDANSETRON HCL 4 MG/2ML IJ SOLN
INTRAMUSCULAR | Status: AC
  Filled 2024-02-10: qty 6

## 2024-02-10 MED ORDER — ONDANSETRON HCL 4 MG/2ML IJ SOLN
INTRAMUSCULAR | Status: DC | PRN
  Administered 2024-02-10: 4 mg via INTRAVENOUS

## 2024-02-10 MED ORDER — OXYTOCIN-SODIUM CHLORIDE 30-0.9 UT/500ML-% IV SOLN
INTRAVENOUS | Status: AC
  Filled 2024-02-10: qty 500

## 2024-02-10 MED ORDER — TRANEXAMIC ACID-NACL 1000-0.7 MG/100ML-% IV SOLN
INTRAVENOUS | Status: AC
  Filled 2024-02-10: qty 100

## 2024-02-10 MED ORDER — METHYLERGONOVINE MALEATE 0.2 MG/ML IJ SOLN
INTRAMUSCULAR | Status: AC
  Filled 2024-02-10: qty 3

## 2024-02-10 MED ORDER — ACETAMINOPHEN 500 MG PO TABS
1000.0000 mg | ORAL_TABLET | Freq: Once | ORAL | Status: AC
  Administered 2024-02-10: 1000 mg via ORAL
  Filled 2024-02-10: qty 2

## 2024-02-10 MED ORDER — FENTANYL CITRATE (PF) 100 MCG/2ML IJ SOLN: 25.0000 ug | INTRAMUSCULAR | Status: DC | PRN

## 2024-02-10 MED ORDER — FENTANYL CITRATE (PF) 250 MCG/5ML IJ SOLN
INTRAMUSCULAR | Status: DC | PRN
  Administered 2024-02-10: 25 ug via INTRAVENOUS
  Administered 2024-02-10: 50 ug via INTRAVENOUS
  Administered 2024-02-10: 25 ug via INTRAVENOUS

## 2024-02-10 MED ORDER — MIDAZOLAM HCL 2 MG/2ML IJ SOLN
INTRAMUSCULAR | Status: AC
  Filled 2024-02-10: qty 2

## 2024-02-10 MED ORDER — DEXAMETHASONE SOD PHOSPHATE PF 10 MG/ML IJ SOLN
INTRAMUSCULAR | Status: DC | PRN
  Administered 2024-02-10: 5 mg via INTRAVENOUS

## 2024-02-10 MED ORDER — POVIDONE-IODINE 10 % EX SWAB
2.0000 | Freq: Once | CUTANEOUS | Status: AC
  Administered 2024-02-10: 2 via TOPICAL

## 2024-02-10 MED ORDER — KETOROLAC TROMETHAMINE 30 MG/ML IJ SOLN
30.0000 mg | INTRAMUSCULAR | Status: AC
  Administered 2024-02-10: 30 mg via INTRAVENOUS
  Filled 2024-02-10: qty 1

## 2024-02-10 MED ORDER — 0.9 % SODIUM CHLORIDE (POUR BTL) OPTIME
TOPICAL | Status: DC | PRN
  Administered 2024-02-10: 1000 mL

## 2024-02-10 MED ORDER — ONDANSETRON HCL 4 MG/2ML IJ SOLN
INTRAMUSCULAR | Status: AC
  Filled 2024-02-10: qty 2

## 2024-02-10 MED ORDER — DEXAMETHASONE SOD PHOSPHATE PF 10 MG/ML IJ SOLN
INTRAMUSCULAR | Status: AC
  Filled 2024-02-10: qty 3

## 2024-02-10 MED ORDER — CHLORHEXIDINE GLUCONATE 0.12 % MT SOLN
15.0000 mL | Freq: Once | OROMUCOSAL | Status: AC
  Administered 2024-02-10: 15 mL via OROMUCOSAL
  Filled 2024-02-10: qty 15

## 2024-02-10 MED ORDER — MISOPROSTOL 200 MCG PO TABS
ORAL_TABLET | ORAL | Status: AC
  Filled 2024-02-10: qty 5

## 2024-02-10 MED ORDER — FENTANYL CITRATE (PF) 100 MCG/2ML IJ SOLN
INTRAMUSCULAR | Status: AC
  Filled 2024-02-10: qty 4

## 2024-02-10 MED ORDER — CEFAZOLIN SODIUM-DEXTROSE 2-4 GM/100ML-% IV SOLN
2.0000 g | INTRAVENOUS | Status: DC
  Filled 2024-02-10: qty 100

## 2024-02-10 MED ORDER — CEFAZOLIN SODIUM 1 G IJ SOLR
INTRAMUSCULAR | Status: AC
  Filled 2024-02-10: qty 20

## 2024-02-10 NOTE — Anesthesia Procedure Notes (Signed)
 Procedure Name: LMA Insertion Date/Time: 02/10/2024 4:58 PM  Performed by: Boyce Shilling, CRNAPre-anesthesia Checklist: Patient identified, Emergency Drugs available, Suction available, Timeout performed and Patient being monitored Patient Re-evaluated:Patient Re-evaluated prior to induction Oxygen Delivery Method: Circle system utilized Preoxygenation: Pre-oxygenation with 100% oxygen Induction Type: IV induction Ventilation: Mask ventilation without difficulty LMA: LMA inserted LMA Size: 3.0 Tube type: Oral Placement Confirmation: positive ETCO2, CO2 detector and breath sounds checked- equal and bilateral Tube secured with: Tape Dental Injury: Teeth and Oropharynx as per pre-operative assessment

## 2024-02-10 NOTE — Transfer of Care (Signed)
 Immediate Anesthesia Transfer of Care Note  Patient: Nina Phillips  Procedure(s) Performed: DILATION AND EVACUATION FOR MANAGEMENT OF MISSED ABORTION IN THE FIRST TRIMESTER WITH ANORA TESTING KIT (Vagina ) ULTRASOUND INTRAOPERATIVE (Vagina )  Patient Location: PACU  Anesthesia Type:General  Level of Consciousness: awake, alert , and oriented  Airway & Oxygen Therapy: Patient Spontanous Breathing  Post-op Assessment: Report given to RN and Post -op Vital signs reviewed and stable  Post vital signs: Reviewed and stable  Last Vitals:  Vitals Value Taken Time  BP    Temp    Pulse    Resp    SpO2      Last Pain:  Vitals:   02/10/24 1327  TempSrc:   PainSc: 0-No pain      Patients Stated Pain Goal: 0 (02/10/24 1319)  Complications: No notable events documented.

## 2024-02-10 NOTE — Anesthesia Preprocedure Evaluation (Addendum)
"                                    Anesthesia Evaluation  Patient identified by MRN, date of birth, ID band Patient awake    Reviewed: Allergy & Precautions, NPO status , Patient's Chart, lab work & pertinent test results  Airway Mallampati: II  TM Distance: >3 FB Neck ROM: Full    Dental  (+) Teeth Intact, Dental Advisory Given   Pulmonary former smoker   Pulmonary exam normal breath sounds clear to auscultation       Cardiovascular hypertension, Normal cardiovascular exam Rhythm:Regular Rate:Normal     Neuro/Psych  PSYCHIATRIC DISORDERS Anxiety Depression    negative neurological ROS     GI/Hepatic negative GI ROS, Neg liver ROS,,,  Endo/Other  diabetes, Type 2, Oral Hypoglycemic Agents    Renal/GU negative Renal ROS     Musculoskeletal negative musculoskeletal ROS (+)    Abdominal   Peds  Hematology  (+) Blood dyscrasia, anemia   Anesthesia Other Findings Day of surgery medications reviewed with the patient.  Reproductive/Obstetrics Missed abortion                              Anesthesia Physical Anesthesia Plan  ASA: 2  Anesthesia Plan: General   Post-op Pain Management: Tylenol  PO (pre-op)*   Induction: Intravenous  PONV Risk Score and Plan: 4 or greater and Midazolam , Dexamethasone  and Ondansetron   Airway Management Planned: LMA  Additional Equipment:   Intra-op Plan:   Post-operative Plan: Extubation in OR  Informed Consent: I have reviewed the patients History and Physical, chart, labs and discussed the procedure including the risks, benefits and alternatives for the proposed anesthesia with the patient or authorized representative who has indicated his/her understanding and acceptance.     Dental advisory given  Plan Discussed with: CRNA  Anesthesia Plan Comments:         Anesthesia Quick Evaluation  "

## 2024-02-10 NOTE — Discharge Instructions (Signed)
 Post-surgical Instructions, Outpatient Surgery  You may expect to feel dizzy, weak, and drowsy for as long as 24 hours after receiving the medicine that made you sleep (anesthetic). For the first 24 hours after your surgery:   Do not drive a car, ride a bicycle, participate in physical activities, or take public transportation  Do not drink alcohol or take tranquilizers.  Do not take medicine that has not been prescribed by your physicians.  Do not sign important papers or make important decisions while on narcotic pain medicines.  Have a responsible person with you.    PAIN MANAGEMENT Ibuprofen  600mg .  (This is the same as 3-200mg  over the counter tablets of Motrin  or ibuprofen .)  Take this every 6 hours or as needed for cramping.   Acetaminophen  1000mg  (This is the same as 2-500mg  over the counter extra strength tylenol ). Take this every 6 hours for the first 3 days or as needed afterwards for pain  DO'S AND DON'T'S Do not take a tub bath for 2 weeks.  You may shower on the first day after your surgery Do move around as you feel able.  Stairs are fine.  You may begin to exercise again as you feel able.  Do not put anything in the vagina for two weeks--no tampons, intercourse, or douching.    REGULAR MEDIATIONS/VITAMINS: You may restart all of your regular medications as prescribed. You may restart all of your vitamins as you normally take them.    PLEASE CALL OR SEEK MEDICAL CARE IF: You have persistent nausea and vomiting.  You have trouble eating or drinking.  You have an oral temperature above 100.5.  You have constipation that is not helped by adjusting diet or increasing fluid intake. Pain medicines are a common cause of constipation.  You have heavy vaginal bleeding - soaking pads in less than 1 hour

## 2024-02-10 NOTE — H&P (Signed)
 "  PRE OPERATIVE HISTORY AND PHYSICAL   Subjective:  Nina Phillips is a 33 y.o. 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 She was counseled on her options for management and opted to proceed with D&E.   No complaints today.  Past Medical History:  Diagnosis Date   Abnormal uterine bleeding (AUB) 07/09/2019   Anemia    Anxiety    Bicornate uterus    Depression    seeing a therapist since 3rd miscarriage, helping, doing good.   Diabetes mellitus without complication (HCC)    Type 2   Gout    History of miscarriage    Hypertension    Miscarriage    Ovarian cyst    Vitamin D  deficiency 10/02/2020   Past Surgical History:  Procedure Laterality Date   WISDOM TOOTH EXTRACTION     Medications Ordered Prior to Encounter[1] Allergies[2] OB History     Gravida  9   Para  1   Term      Preterm  1   AB  5   Living  0      SAB  5   IAB      Ectopic      Multiple  0   Live Births             Social History   Socioeconomic History   Marital status: Married    Spouse name: Not on file   Number of children: Not on file   Years of education: Not on file   Highest education level: Not on file  Occupational History   Not on file  Tobacco Use   Smoking status: Former   Smokeless tobacco: Never   Tobacco comments:    quit 2014  Vaping Use   Vaping status: Never Used  Substance and Sexual Activity   Alcohol use: Not Currently    Comment: occas   Drug use: Never   Sexual activity: Yes    Birth control/protection: None    Comment: last ic yesterday  Other Topics Concern   Not on file  Social History Narrative   Not on file   Social Drivers of Health   Tobacco Use: Medium Risk (02/09/2024)   Patient History    Smoking Tobacco Use:  Former    Smokeless Tobacco Use: Never    Passive Exposure: Not on Actuary Strain: Low Risk (11/05/2022)   Overall Financial Resource Strain (CARDIA)    Difficulty of Paying Living Expenses: Not hard at all  Food Insecurity: No Food Insecurity (11/05/2022)   Hunger Vital Sign    Worried About Running Out of Food in the Last Year: Never true    Ran Out of Food in the Last Year: Never true  Transportation Needs: No Transportation Needs (08/28/2021)   PRAPARE - Administrator, Civil Service (Medical): No    Lack of Transportation (Non-Medical): No  Physical Activity: Insufficiently Active (11/05/2022)   Exercise Vital Sign    Days of Exercise per Week: 4 days    Minutes of Exercise per Session: 30 min  Stress: No Stress Concern Present (11/05/2022)   [employer] of Occupational Health - Occupational Stress Questionnaire    Feeling of Stress : Only a little  Social Connections: Moderately Integrated (11/05/2022)   Social Connection and  Isolation Panel    Frequency of Communication with Friends and Family: More than three times a week    Frequency of Social Gatherings with Friends and Family: More than three times a week    Attends Religious Services: More than 4 times per year    Active Member of Clubs or Organizations: No    Attends Banker Meetings: Never    Marital Status: Married  Catering Manager Violence: Not At Risk (11/05/2022)   Humiliation, Afraid, Rape, and Kick questionnaire    Fear of Current or Ex-Partner: No    Emotionally Abused: No    Physically Abused: No    Sexually Abused: No  Depression (PHQ2-9): Low Risk (11/05/2022)   Depression (PHQ2-9)    PHQ-2 Score: 1  Alcohol Screen: Low Risk (11/05/2022)   Alcohol Screen    Last Alcohol Screening Score (AUDIT): 0  Housing: Low Risk (11/05/2022)   Housing    Last Housing Risk Score: 0  Utilities: Not At Risk (11/05/2022)   AHC Utilities    Threatened with loss of  utilities: No  Health Literacy: Not on file   Objective:   Vitals:   02/10/24 1307  BP: (!) 142/99  Pulse: 79  Resp: 18  Temp: 99 F (37.2 C)  TempSrc: Oral  SpO2: 100%  Weight: 116.1 kg  Height: 5' 2 (1.575 m)    General:  Alert, oriented and cooperative. Patient is in no acute distress.  Skin: Skin is warm and dry. No rash noted.   Cardiovascular: Normal heart rate noted  Respiratory: Normal respiratory effort, no problems with respiration noted    Assessment and Plan:  Nina Phillips is a 34 y.o. with missed abortion here for scheduled D&E  - Diagnosis: missed abortion - Planned surgery: dilation & evacuation  - Risks of surgery include but are not limited to: bleeding, infection, injury to surrounding organs/tissues (i.e. bowel/bladder/ureters), need for additional procedures, wound complications, hospital re-admission, VTE, additional complications: Uterine perforation - We discussed postop restrictions, precautions and expectations - Preop testing needed: None - All questions answered - Desires [facility] testing - ordered - Doxycycline  ordered for pre op abx - To OR when ready  Kieth JAYSON Carolin, MD     [1]  No current facility-administered medications on file prior to encounter.   Current Outpatient Medications on File Prior to Encounter  Medication Sig Dispense Refill   metFORMIN  (GLUCOPHAGE -XR) 500 MG 24 hr tablet Take 2 tablets (1,000 mg total) by mouth in the morning and at bedtime. 360 tablet 3   [DISCONTINUED] diphenhydrAMINE  (BENADRYL ) 25 mg capsule Take 25 mg by mouth every 6 (six) hours as needed for allergies (and allergic reactions).     [2]  Allergies Allergen Reactions   Apple Fruit Extract Hives   Banana Hives   Other Hives    Walnuts    Shellfish Allergy Hives   Tomato Hives   Watermelon [Citrullus Vulgaris] Hives   Bactrim  [Sulfamethoxazole -Trimethoprim ] Hives and Rash   Sulfa  Antibiotics Hives and Rash   "

## 2024-02-10 NOTE — Op Note (Signed)
 Preop Diagnosis: Missed abortion Postop Diagnosis: Same Procedure: Dilation and evacuation with ultrasound guidance Surgeon: Dr. Kieth Carolin Assist: Dr. Charlie Courts Anesthesia: LMA  EBL: 25 cc IVF: 900 cc  UOP: Not collected Complications: None  Findings: Anteverted bicornuate uterus, products of conception noted. Preprocedural ultrasound with intrauterine gestational sac, thin uterine stripe on post procedural US   Description of the procedure: Preop antibiotics of doxycycline  given. Informed consent reviewed and signed. Pt given opportunity to ask questions.   Pt prepped and draped in the dorsal lithotomy fashion after LMA anesthesia found to be adequate. Timeout performed.   Open-sided speculum placed into the vagina. Cervix grasped with a single tooth tenaculum. Cervix progressively dilated to a 25 French.  I used a 8 mm rigid curette. Several passes done with the suction curette and the tissue was retrieved. A gentle pass with a sharp curette was performed and gritty texture noted in all areas of the uterus confirming complete evacuation. Minimal bleeding noted. Procedure completed. All instruments removed. Counts correct x2.   Products of conception sent to [facility] with remainder to pathology. Accepts genetics. Rh+  Pt taken to recovery room in stable condition.  Kieth Carolin, MD Obstetrician & Gynecologist, [facility] for [employer], [facility]

## 2024-02-13 ENCOUNTER — Encounter: Payer: Self-pay | Admitting: Obstetrics & Gynecology

## 2024-02-13 ENCOUNTER — Encounter: Payer: Self-pay | Admitting: *Deleted

## 2024-02-13 ENCOUNTER — Other Ambulatory Visit (HOSPITAL_COMMUNITY): Payer: Self-pay | Admitting: Obstetrics and Gynecology

## 2024-02-13 ENCOUNTER — Encounter (HOSPITAL_COMMUNITY): Payer: Self-pay | Admitting: Obstetrics and Gynecology

## 2024-02-13 DIAGNOSIS — O021 Missed abortion: Secondary | ICD-10-CM

## 2024-02-14 ENCOUNTER — Ambulatory Visit: Payer: Self-pay | Admitting: Obstetrics and Gynecology

## 2024-02-14 LAB — SURGICAL PATHOLOGY

## 2024-02-14 NOTE — Anesthesia Postprocedure Evaluation (Signed)
"   Anesthesia Post Note  Patient: Priti Consoli  Procedure(s) Performed: DILATION AND EVACUATION FOR MANAGEMENT OF MISSED ABORTION IN THE FIRST TRIMESTER WITH [facility]OR[facility] TESTING KIT (Vagina ) ULTRASOUND INTRAOPERATIVE (Vagina )     Patient location during evaluation: PACU Anesthesia Type: General Level of consciousness: awake and alert, oriented and patient cooperative Pain management: pain level controlled Vital Signs Assessment: post-procedure vital signs reviewed and stable Respiratory status: spontaneous breathing, nonlabored ventilation and respiratory function stable Cardiovascular status: blood pressure returned to baseline and stable Postop Assessment: no apparent nausea or vomiting Anesthetic complications: no   No notable events documented.  Last Vitals:  Vitals:   02/10/24 1815 02/10/24 1830  BP: 126/83 (!) 147/90  Pulse: 80 72  Resp: 15 20  Temp:  36.8 C  SpO2: 100% 100%    Last Pain:  Vitals:   02/10/24 1830  TempSrc:   PainSc: 0-No pain                 Almarie HERO Donnie Gedeon      "

## 2024-02-17 ENCOUNTER — Ambulatory Visit

## 2024-02-17 ENCOUNTER — Encounter: Payer: Self-pay | Admitting: Rheumatology

## 2024-02-17 ENCOUNTER — Ambulatory Visit: Attending: Rheumatology | Admitting: Rheumatology

## 2024-02-17 VITALS — BP 168/101 | HR 80 | Temp 97.7°F | Resp 17 | Ht 64.0 in | Wt 260.2 lb

## 2024-02-17 DIAGNOSIS — E1129 Type 2 diabetes mellitus with other diabetic kidney complication: Secondary | ICD-10-CM

## 2024-02-17 DIAGNOSIS — M79671 Pain in right foot: Secondary | ICD-10-CM

## 2024-02-17 DIAGNOSIS — N96 Recurrent pregnancy loss: Secondary | ICD-10-CM | POA: Diagnosis not present

## 2024-02-17 DIAGNOSIS — Q513 Bicornate uterus: Secondary | ICD-10-CM | POA: Diagnosis not present

## 2024-02-17 DIAGNOSIS — M1380 Other specified arthritis, unspecified site: Secondary | ICD-10-CM | POA: Diagnosis not present

## 2024-02-17 DIAGNOSIS — M79642 Pain in left hand: Secondary | ICD-10-CM

## 2024-02-17 DIAGNOSIS — Z794 Long term (current) use of insulin: Secondary | ICD-10-CM

## 2024-02-17 DIAGNOSIS — M79672 Pain in left foot: Secondary | ICD-10-CM | POA: Diagnosis not present

## 2024-02-17 DIAGNOSIS — M1A09X0 Idiopathic chronic gout, multiple sites, without tophus (tophi): Secondary | ICD-10-CM

## 2024-02-17 DIAGNOSIS — I10 Essential (primary) hypertension: Secondary | ICD-10-CM | POA: Diagnosis not present

## 2024-02-17 DIAGNOSIS — M79641 Pain in right hand: Secondary | ICD-10-CM

## 2024-02-17 DIAGNOSIS — R809 Proteinuria, unspecified: Secondary | ICD-10-CM

## 2024-02-17 DIAGNOSIS — R5383 Other fatigue: Secondary | ICD-10-CM | POA: Diagnosis not present

## 2024-02-17 DIAGNOSIS — Z8269 Family history of other diseases of the musculoskeletal system and connective tissue: Secondary | ICD-10-CM

## 2024-02-17 NOTE — Patient Instructions (Signed)
Information for patients with Gout  Gout defined-Gout occurs when urate crystals accumulate in your joint causing the inflammation and intense pain of gout attack.  Urate crystals can form when you have high levels of uric acid in your blood.  Your body produces uric acid when it breaks down prurines-substances that are found naturally in your body, as well as in certain foods such as organ meats, anchioves, herring, asparagus, and mushrooms.  Normally uric acid dissolves in your blood and passes through your kidneys into your urine.  But sometimes your body either produces too much uric acid or your kidneys excrete too little uric acid.  When this happens, uric acid can build up, forming sharp needle-like urate crystals in a joint or surrounding tissue that cause pain, inflammation and swelling.    Gout is characterized by sudden, severe attacks of pain, redness and tenderness in joints, often the joint at the base of the big toe.  Gout is complex form of arthritis that can affect anyone.  Men are more likely to get gout but women become increasingly more susceptible to gout after menopause.  An acute attack of gout can wake you up in the middle of the night with the sensation that your big toe is on fire.  The affected joint is hot, swollen and so tender that even the weight or the sheet on it may seem intolerable.  If you experience symptoms of an acute gout attack it is important to your doctor as soon as the symptoms start.  Gout that goes untreated can lead to worsening pain and joint damage.  Risk Factors:  You are more likely to develop gout if you have high levels of uric acid in your body.    Factors that increase the uric acid level in your body include:  Lifestyle factors.  Excessive alcohol use-generally more than two drinks a day for men and more than one for women increase the risk of gout.  Medical conditions.  Certain conditions make it more likely that you will develop gout.   These include hypertension, and chronic conditions such as diabetes, high levels of fat and cholesterol in the blood, and narrowing of the arteries.  Certain medications.  The uses of Thiazide diuretics- commonly used to treat hypertension and low dose aspirin can also increase uric acid levels.  Family history of gout.  If other members of your family have had gout, you are more likely to develop the disease.  Age and sex. Gout occurs more often in men than it does in women, primarily because women tend to have lower uric acid levels than men do.  Men are more likely to develop gout earlier usually between the ages of 62-50- whereas women generally develop signs and symptoms after menopause.    Tests and diagnosis:  Tests to help diagnose gout may include:  Blood test.  Your doctor may recommend a blood test to measure the uric acid level in your blood .  Blood tests can be misleading, though.  Some people have high uric acid levels but never experience gout.  And some people have signs and symptoms of gout, but don't have unusual levels of uric acid in their blood.  Joint fluid test.  Your doctor may use a needle to draw fluid from your affected joint.  When examined under the microscope, your joint fluid may reveal urate crystals.  Treatment:  Treatment for gout usually involves medications.  What medications you and your doctor choose will be  based on your current health and other medications you currently take.  Gout medications can be used to treat acute gout attacks and prevent future attacks as well as reduce your risk of complications from gout such as the development of tophi from urate crystal deposits.  Alternative medicine:   Certain foods have been studied for their potential to lower uric acid levels, including:  Coffee.  Studies have found an association between coffee drinking (regular and decaf) and lower uric acid levels.  The evidence is not enough to encourage  non-coffee drinkers to start, but it may give clues to new ways of treating gout in the future.  Vitamin C.  Supplements containing vitamin C may reduce the levels of uric acid in your blood.  However, vitamin as a treatment for gout. Don't assume that if a little vitamin C is good, than lots is better.  Megadoses of vitamin C may increase your bodies uric acid levels.  Cherries.  Cherries have been associated with lower levels of uric acid in studies, but it isn't clear if they have any effect on gout signs and symptoms.  Eating more cherries and other dar-colored fruits, such as blackberries, blueberries, purple grapes and raspberries, may be a safe way to support your gout treatment.    Lifestyle/Diet Recommendations:  Drink 8 to 16 cups ( about 2 to 4 liters) of fluid each day, with at least half being water. Avoid alcohol Eat a moderate amount of protein, preferably from healthy sources, such as low-fat or fat-free dairy, tofu, eggs, and nut butters. Limit you daily intake of meat, fish, and poultry to 4 to 6 ounces. Avoid high fat meats and desserts. Decrease you intake of shellfish, beef, lamb, pork, eggs and cheese. Choose a good source of vitamin C daily such as citrus fruits, strawberries, broccoli,  brussel sprouts, papaya, and cantaloupe.  Choose a good source of vitamin A every other day such as yellow fruits, or dark green/yellow vegetables. Avoid drastic weigh reduction or fasting.  If weigh loss is desired lose it over a period of several months. See "dietary considerations.." chart for specific food recommendations.  Dietary Considerations for people with Gout  Food with negligible purine content (0-15 mg of purine nitrogen per 100 grams food)  May use as desired except on calorie variations  Non fat milk Cocoa Cereals (except in list II) Hard candies  Buttermilk Carbonated drinks Vegetables (except in list II) Sherbet  Coffee Fruits Sugar Honey  Tea Cottage Cheese  Gelatin-jell-o Salt  Fruit juice Breads Angel food Cake   Herbs/spices Jams/Jellies [employer]    Foods that do not contain excessive purine content, but must be limited due to fat content  Cream Eggs Oil and Salad Dressing  Half and Half Peanut Butter Chocolate  Whole Milk Cakes Potato Chips  Butter Ice Cream Fried Foods  Cheese Nuts Waffles, pancakes   List II: Food with moderate purine content (50-150 mg of purine nitrogen per 100 grams of food)  Limit total amount each day to 5 oz. cooked Lean meat, other than those on list III   Poultry, other than those on list III Fish, other than those on list III   Seafood, other than those on list III  These foods may be used occasionally  Peas Lentils Bran  Spinach Oatmeal Dried Beans and Peas  Asparagus Wheat Germ Mushrooms   Additional information about meat choices  Choose fish and poultry, particularly without skin, often.  Select lean, well  trimmed cuts of meat.  Avoid all fatty meats, bacon , sausage, fried meats, fried fish, or poultry, luncheon meats, cold cuts, hot dogs, meats canned or frozen in gravy, spareribs and frozen and packaged prepared meats.   List III: Foods with HIGH purine content / Foods to AVOID (150-800 mg of purine nitrogen per 100 grams of food)  Anchovies Herring Meat Broths  Liver Mackerel Meat Extracts  Kidney Scallops Meat Drippings  Sardines Wild Game Mincemeat  Sweetbreads Goose Gravy  Heart Tongue Yeast, baker's and brewers   Commercial soups made with any of the foods listed in List II or List III  In addition avoid all alcoholic beverages  Colchicine Capsules What is this medication? COLCHICINE (KOL chi seen) prevents gout attacks. It works by decreasing inflammation and reducing the buildup of uric acid in your joints. This medicine may be used for other purposes; ask your health care provider or pharmacist if you have questions. COMMON BRAND NAME(S): MITIGARE What should  I tell my care team before I take this medication? They need to know if you have any of these conditions: Kidney disease Liver disease An unusual or allergic reaction to colchicine, other medications, foods, dyes, or preservatives Pregnant or trying to get pregnant Breast-feeding How should I use this medication? Take this medication by mouth with water. Take it as directed on the prescription label at the same time every day. You can take it with or without food. If it upsets your stomach, take it with food. A special MedGuide will be given to you by the pharmacist with each prescription and refill. Be sure to read this information carefully each time. Talk to your care team about the use of this medication in children. Special care may be needed. People 65 years and older may have a stronger reaction and need a smaller dose. Overdosage: If you think you have taken too much of this medicine contact a poison control center or emergency room at once. NOTE: This medicine is only for you. Do not share this medicine with others. What if I miss a dose? If you miss a dose, take it as soon as you can. If it is almost time for your next dose, take only that dose. Do not take double or extra doses. What may interact with this medication? Do not take this medication with any of the following: Certain antivirals for HIV or hepatitis This medication may also interact with the following: Certain antibiotics, such as erythromycin or clarithromycin Certain medications for blood pressure, heart disease, irregular heartbeat Certain medications for cholesterol, such as atorvastatin, lovastatin, or simvastatin Certain medications for fungal infections, such as ketoconazole, itraconazole, or posaconazole Cyclosporine Grapefruit or grapefruit juice This list may not describe all possible interactions. Give your health care provider a list of all the medicines, herbs, non-prescription drugs, or dietary  supplements you use. Also tell them if you smoke, drink alcohol, or use illegal drugs. Some items may interact with your medicine. What should I watch for while using this medication? Visit your care team for regular checks on your progress. Tell your care team if your symptoms do not start to get better or if they get worse. You should make sure you get enough vitamin B12 while you are taking this medication. Discuss the foods you eat and the vitamins you take with your care team. This medication may increase your risk to bruise or bleed. Call you care team if you notice any unusual bleeding. What side effects may  I notice from receiving this medication? Side effects that you should report to your care team as soon as possible: Allergic reactions--skin rash, itching, hives, swelling of the face, lips, tongue, or throat Infection--fever, chills, cough, sore throat, wounds that don't heal, pain or trouble when passing urine, general feeling of discomfort or being unwell Muscle injury--unusual weakness or fatigue, muscle pain, dark yellow or brown urine, decrease in the amount of urine Pain, tingling, or numbness in the hands or feet Unusual bleeding or bruising Side effects that usually do not require medical attention (report these to your care team if they continue or are bothersome): Diarrhea Nausea Vomiting This list may not describe all possible side effects. Call your doctor for medical advice about side effects. You may report side effects to FDA at 1-800-FDA-1088. Where should I keep my medication? Keep out of the reach of children and pets. Store at room temperature between 20 and 25 degrees C (68 and 77 degrees F). Protect from light and moisture. Keep the container tightly closed. Get rid of any unused medication after the expiration date. To get rid of medications that are no longer needed or have expired: Take the medication to a medication take-back program. Check with your pharmacy  or law enforcement to find a location. If you cannot return the medication, check the label or package insert to see if the medication should be thrown out in the garbage or flushed down the toilet. If you are not sure, ask your care team. If it is safe to put it in the trash, take the medication out of the container. Mix the medication with cat litter, dirt, coffee grounds, or other unwanted substance. Seal the mixture in a bag or container. Put it in the trash. NOTE: This sheet is a summary. It may not cover all possible information. If you have questions about this medicine, talk to your doctor, pharmacist, or health care provider.  2024 Elsevier/Gold Standard (2021-08-12 00:00:00) Allopurinol Tablets What is this medication? ALLOPURINOL (al oh PURE i nole) treats gout or kidney stones. It may also be used to prevent high uric acid levels after chemotherapy. It works by decreasing uric acid levels in your body. This medicine may be used for other purposes; ask your health care provider or pharmacist if you have questions. COMMON BRAND NAME(S): Zyloprim What should I tell my care team before I take this medication? They need to know if you have any of these conditions: Kidney disease Liver disease An unusual or allergic reaction to allopurinol, other medications, foods, dyes, or preservatives Pregnant or trying to get pregnant Breast-feeding How should I use this medication? Take this medication by mouth with a full glass of water. Take it as directed on the prescription label at the same time every day. You can take it with or without food. If it upsets your stomach, take it with food. Keep taking it unless your care team tells you to stop. Talk to your care team about the use of this medication in children. While this medication may be prescribed for children for selected conditions, precautions do apply. Overdosage: If you think you have taken too much of this medicine contact a poison  control center or emergency room at once. NOTE: This medicine is only for you. Do not share this medicine with others. What if I miss a dose? If you miss a dose, take it as soon as you can. If it is almost time for your next dose, take only that dose.  Do not take double or extra doses. What may interact with this medication? Do not take this medication with the following: Didanosine, ddI This medication may also interact with the following: Certain antibiotics like amoxicillin, ampicillin Certain medications for cancer Certain medications for immunosuppression like azathioprine, cyclosporine, mercaptopurine Chlorpropamide Probenecid Sulfinpyrazone Thiazide diuretics, like hydrochlorothiazide Warfarin This list may not describe all possible interactions. Give your health care provider a list of all the medicines, herbs, non-prescription drugs, or dietary supplements you use. Also tell them if you smoke, drink alcohol, or use illegal drugs. Some items may interact with your medicine. What should I watch for while using this medication? Visit your care team for regular checks on your progress. If you are taking this medication to treat gout, you may not have less frequent attacks at first. Keep taking your medication regularly and the attacks should get better within 2 to 6 weeks. Drink plenty of water (10 to 12 full glasses a day) while you are taking this medication. This will help to reduce stomach upset and reduce the risk of getting gout or kidney stones. This medication may cause serious skin reactions. They can happen weeks to months after starting the medication. Contact your care team right away if you notice fevers or flu-like symptoms with a rash. The rash may be red or purple and then turn into blisters or peeling of the skin. You may also notice a red rash with swelling of the face, lips, or lymph nodes in your neck or under your arms. Do not take vitamin C without asking your care  team. Too much vitamin C can increase the chance of getting kidney stones. This medication may affect your coordination, reaction time, or judgment. Do not drive or operate machinery until you know how this medication affects you. Sit up or stand slowly to reduce the risk of dizzy or fainting spells. Drinking alcohol with this medication can increase the risk of these side effects. What side effects may I notice from receiving this medication? Side effects that you should report to your care team as soon as possible: Allergic reactions--skin rash, itching, hives, swelling of the face, lips, tongue, or throat Kidney injury--decrease in the amount of urine, swelling of the ankles, hands, or feet Liver injury--right upper belly pain, loss of appetite, nausea, light-colored stool, dark yellow or brown urine, yellowing skin or eyes, unusual weakness or fatigue Rash, fever, and swollen lymph nodes Redness, blistering, peeling, or loosening of the skin, including inside the mouth Side effects that usually do not require medical attention (report to your care team if they continue or are bothersome): Diarrhea Drowsiness Nausea Vomiting This list may not describe all possible side effects. Call your doctor for medical advice about side effects. You may report side effects to FDA at 1-800-FDA-1088. Where should I keep my medication? Keep out of the reach of children and pets. Store at room temperature between 15 and 25 degrees C (59 and 77 degrees F). Protect from light and moisture. Throw away any unused medication after the expiration date. NOTE: This sheet is a summary. It may not cover all possible information. If you have questions about this medicine, talk to your doctor, pharmacist, or health care provider.  2024 Elsevier/Gold Standard (2021-07-31 00:00:00)

## 2024-02-19 ENCOUNTER — Ambulatory Visit: Payer: Self-pay | Admitting: Rheumatology

## 2024-02-19 NOTE — Progress Notes (Signed)
 Hemoglobin is low and creatinine is mildly elevated.  Sedimentation rate is elevated most likely due to anemia.  Magnesium  is low.  Patient should take over-the-counter magnesium  supplement.  Uric acid is high at 10.1.  Rheumatoid factor negative, ENA panel negative, antiphospholipid antibodies, ANA and anti-CCP pending.  Hemoglobin is low.  Please refer to hematology for evaluation of low hemoglobin (rule out hemolysis).  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  Please send a prescription for colchicine  0.6 mg p.o. daily, 90-day supply. Allopurinol  100 mg tablet, 1 tablet p.o. daily for 1 week, 2 tablets p.o. daily for 1 week and then 3 tablets p.o. daily. For the following month we will send allopurinol  300 mg tablet, 1 tablet p.o. daily. Please forward labs to her PCP.

## 2024-02-21 ENCOUNTER — Other Ambulatory Visit: Payer: Self-pay

## 2024-02-21 DIAGNOSIS — D649 Anemia, unspecified: Secondary | ICD-10-CM

## 2024-02-21 LAB — COMPREHENSIVE METABOLIC PANEL WITH GFR
AG Ratio: 0.9 (calc) — ABNORMAL LOW (ref 1.0–2.5)
ALT: 9 U/L (ref 6–29)
AST: 7 U/L — ABNORMAL LOW (ref 10–30)
Albumin: 3.4 g/dL — ABNORMAL LOW (ref 3.6–5.1)
Alkaline phosphatase (APISO): 95 U/L (ref 31–125)
BUN/Creatinine Ratio: 12 (calc) (ref 6–22)
BUN: 12 mg/dL (ref 7–25)
CO2: 23 mmol/L (ref 20–32)
Calcium: 8.7 mg/dL (ref 8.6–10.2)
Chloride: 107 mmol/L (ref 98–110)
Creat: 1.01 mg/dL — ABNORMAL HIGH (ref 0.50–0.97)
Globulin: 3.6 g/dL (ref 1.9–3.7)
Glucose, Bld: 99 mg/dL (ref 65–99)
Potassium: 4.6 mmol/L (ref 3.5–5.3)
Sodium: 140 mmol/L (ref 135–146)
Total Bilirubin: 0.2 mg/dL (ref 0.2–1.2)
Total Protein: 7 g/dL (ref 6.1–8.1)
eGFR: 76 mL/min/{1.73_m2}

## 2024-02-21 LAB — CARDIOLIPIN ANTIBODIES, IGG, IGM, IGA
Anticardiolipin IgA: <2 [APL'U]/mL
Anticardiolipin IgG: <2 [GPL'U]/mL
Anticardiolipin IgM: <2 [MPL'U]/mL

## 2024-02-21 LAB — MAGNESIUM: Magnesium: 1.3 mg/dL — ABNORMAL LOW (ref 1.5–2.5)

## 2024-02-21 LAB — ANORA MISCARRIAGE TEST - FRESH

## 2024-02-21 LAB — BETA-2 GLYCOPROTEIN ANTIBODIES
Beta-2 Glyco 1 IgA: <2 U/mL
Beta-2 Glyco 1 IgM: <2 U/mL
Beta-2 Glyco I IgG: <2 U/mL

## 2024-02-21 LAB — CBC WITH DIFFERENTIAL/PLATELET
Absolute Lymphocytes: 2345 {cells}/uL (ref 850–3900)
Absolute Monocytes: 607 {cells}/uL (ref 200–950)
Basophils Absolute: 41 {cells}/uL (ref 0–200)
Basophils Relative: 0.5 %
Eosinophils Absolute: 107 {cells}/uL (ref 15–500)
Eosinophils Relative: 1.3 %
HCT: 32.1 % — ABNORMAL LOW (ref 35.9–46.0)
Hemoglobin: 9.7 g/dL — ABNORMAL LOW (ref 11.7–15.5)
MCH: 24.6 pg — ABNORMAL LOW (ref 27.0–33.0)
MCHC: 30.2 g/dL — ABNORMAL LOW (ref 31.6–35.4)
MCV: 81.5 fL (ref 81.4–101.7)
MPV: 11.4 fL (ref 7.5–12.5)
Monocytes Relative: 7.4 %
Neutro Abs: 5100 {cells}/uL (ref 1500–7800)
Neutrophils Relative %: 62.2 %
Platelets: 428 10*3/uL — ABNORMAL HIGH (ref 140–400)
RBC: 3.94 Million/uL (ref 3.80–5.10)
RDW: 15.9 % — ABNORMAL HIGH (ref 11.0–15.0)
Total Lymphocyte: 28.6 %
WBC: 8.2 10*3/uL (ref 3.8–10.8)

## 2024-02-21 LAB — SJOGRENS SYNDROME-A EXTRACTABLE NUCLEAR ANTIBODY: SSA (Ro) (ENA) Antibody, IgG: <1 AI

## 2024-02-21 LAB — RNP ANTIBODY: Ribonucleic Protein(ENA) Antibody, IgG: <1 AI

## 2024-02-21 LAB — SEDIMENTATION RATE: Sed Rate: 51 mm/h — ABNORMAL HIGH (ref 0–20)

## 2024-02-21 LAB — ANA: Anti Nuclear Antibody (ANA): NEGATIVE

## 2024-02-21 LAB — LUPUS ANTICOAGULANT EVAL W/ REFLEX
PTT-LA Screen: 38 s
dRVVT: 36 s

## 2024-02-21 LAB — C3 AND C4
C3 Complement: 220 mg/dL — ABNORMAL HIGH (ref 83–193)
C4 Complement: 48 mg/dL (ref 15–57)

## 2024-02-21 LAB — ANTI-SMITH ANTIBODY: ENA SM Ab Ser-aCnc: <1 AI

## 2024-02-21 LAB — ANTI-DNA ANTIBODY, DOUBLE-STRANDED: ds DNA Ab: <1 [IU]/mL

## 2024-02-21 LAB — SJOGRENS SYNDROME-B EXTRACTABLE NUCLEAR ANTIBODY: SSB (La) (ENA) Antibody, IgG: <1 AI

## 2024-02-21 LAB — CYCLIC CITRUL PEPTIDE ANTIBODY, IGG: Cyclic Citrullin Peptide Ab: <16 U

## 2024-02-21 LAB — URIC ACID: Uric Acid, Serum: 10.1 mg/dL — ABNORMAL HIGH (ref 2.5–7.0)

## 2024-02-21 LAB — RHEUMATOID FACTOR: Rheumatoid fact SerPl-aCnc: <10 [IU]/mL

## 2024-02-21 MED ORDER — COLCHICINE 0.6 MG PO TABS: 0.6000 mg | ORAL_TABLET | Freq: Every day | ORAL | 0 refills | Status: AC

## 2024-02-21 MED ORDER — ALLOPURINOL 100 MG PO TABS: ORAL_TABLET | ORAL | 0 refills | Status: AC

## 2024-02-21 NOTE — Telephone Encounter (Signed)
 Per lab note: Please send a prescription for colchicine  0.6 mg p.o. daily, 90-day supply. Allopurinol  100 mg tablet, 1 tablet p.o. daily for 1 week, 2 tablets p.o. daily for 1 week and then 3 tablets p.o. daily. For the following month we will send allopurinol  300 mg tablet, 1 tablet p.o. daily.   Please review and sign pended prescriptions. Thanks!

## 2024-02-21 NOTE — Progress Notes (Signed)
 Please review and sign pended hematology referral. Thanks!

## 2024-02-22 NOTE — Progress Notes (Signed)
ANA negative, anti-CCP negative.

## 2024-02-27 NOTE — Progress Notes (Unsigned)
 "  Office Visit Note  Patient: Nina Phillips             Date of Birth: 08-Nov-1990           MRN: [id number]             PCP: Prentiss Dorothyann Maxwell, MD (Inactive) Referring: No ref. provider found Visit Date: 03/12/2024 Occupation: Scientist, Forensic  Subjective:  No chief complaint on file.   History of Present Illness: Nina Phillips is a 34 y.o. female ***     Activities of Daily Living:  Patient reports morning stiffness for *** {minute/hour:19697}.   Patient {ACTIONS;DENIES/REPORTS:21021675::Denies} nocturnal pain.  Difficulty dressing/grooming: {ACTIONS;DENIES/REPORTS:21021675::Denies} Difficulty climbing stairs: {ACTIONS;DENIES/REPORTS:21021675::Denies} Difficulty getting out of chair: {ACTIONS;DENIES/REPORTS:21021675::Denies} Difficulty using hands for taps, buttons, cutlery, and/or writing: {ACTIONS;DENIES/REPORTS:21021675::Denies}  No Rheumatology ROS completed.   PMFS History:  Patient Active Problem List   Diagnosis Date Noted   Miscarriage 02/07/2024   Elevated BP without diagnosis of hypertension 02/07/2024   Hypertension 02/07/2024   Missed abortion measuring around 10 weeks of gestation 11/23/2022   Asymptomatic bacteriuria during pregnancy in first trimester 11/10/2022   Supervision of high-risk pregnancy 10/29/2022   Morbid obesity (HCC) 10/02/2020   History of IUFD 11/10/2019   Chronic hypertension during pregnancy, antepartum 08/27/2019   Type 2 diabetes mellitus with microalbuminuria, with long-term current use of insulin  (HCC) 06/26/2019   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   Diabetes mellitus without complication (HCC)    Type 2   Gout     History of miscarriage    Hypertension    Miscarriage    Ovarian cyst    Pneumonia    Vitamin D  deficiency 10/02/2020    Family History  Problem Relation Age of Onset   Hypertension Mother    Gout Mother    Diabetes Father    Hypertension Father    Stroke Father    Hypertension Sister        during pregnancy   Hypertension Brother    Gout Brother    Hypertension Maternal Aunt    Breast cancer Maternal Aunt    Breast cancer Maternal Grandmother    Hypertension Maternal Grandfather    Heart attack Maternal Grandfather    Stroke Paternal Grandmother    Diabetes Paternal Grandfather    Cancer Other    Hypertension Other    Diabetes Other    Past Surgical History:  Procedure Laterality Date   DILATION AND EVACUATION N/A 02/10/2024   Procedure: DILATION AND EVACUATION FOR MANAGEMENT OF MISSED ABORTION IN THE FIRST TRIMESTER WITH ANORA TESTING KIT;  Surgeon: Erik Kieth BROCKS, MD;  Location: [facility] OR;  Service: Gynecology;  Laterality: N/A;   OPERATIVE ULTRASOUND N/A 02/10/2024   Procedure: ULTRASOUND INTRAOPERATIVE;  Surgeon: Erik Kieth BROCKS, MD;  Location: [facility] OR;  Service: Gynecology;  Laterality: N/A;   WISDOM TOOTH EXTRACTION     Social History[1] Social History   Social History Narrative   Not on file     Immunization History  Administered Date(s) Administered   Influenza,inj,Quad PF,6+ Mos 10/10/2019   Tdap 11/11/2019     Objective: Vital Signs: There were no vitals taken for this visit.   Physical  Exam   Musculoskeletal Exam: ***  CDAI Exam: CDAI Score: -- Patient Global: --; Provider Global: -- Swollen: --; Tender: -- Joint Exam 03/12/2024   No joint exam has been documented for this visit   There is currently no information documented on the homunculus. Go to the Rheumatology activity and complete the homunculus joint exam.  Investigation: No additional findings.  Imaging: XR Hand 2 View Left Result Date: 02/17/2024 No MCP, PIP or DIP  narrowing was noted.  No intercarpal or radiocarpal joint space narrowing was noted. Impression: Unremarkable x-rays of the hand.  XR Hand 2 View Right Result Date: 02/17/2024 No MCP, PIP or DIP narrowing was noted.  No intercarpal or radiocarpal joint space narrowing was noted. Impression: Unremarkable x-rays of the hand.  XR Foot 2 Views Right Result Date: 02/17/2024 No MTP, PIP, DIP, intertarsal, tibiotalar or subtalar joint space narrowing was noted.  No erosive changes were noted. Impression: Unremarkable x-rays of the foot.  XR Foot 2 Views Left Result Date: 02/17/2024 Hallux valgus deformity was noted.    Dorsal spurring was noted.  No tibiotalar or subtalar joint space narrowing was noted.  No erosive changes were noted. Impression: These findings are suggestive of hallux valgus deformity and dorsal spurring.  US  OB Comp Less 14 Wks Result Date: 02/12/2024 Table formatting from the original result was not included. Images from the original result were not included.  ..[employer] [facility] TECHNICAL SALES ENGINEER) accredited practice Center for [facility] [address] [place] [address] Ordering Provider: Ozan, Jennifer, DO                      DATING AND VIABILITY SONOGRAM Nina Phillips is a 34 y.o. year old [id number] with LMP Patient's last menstrual period was 12/04/2023 (exact date). which would correlate to  [redacted]w[redacted]d weeks gestation.  She has regular menstrual cycles.   She is here today for a confirmatory initial sonogram. EXAM: TRANSABDOMINAL AND TRANSVAGINAL ULTRASOUND OF PELVIS  TECHNIQUE: Transabdominal and transvaginal ultrasound examinations of the pelvis were performed. Transabdominal technique was performed for global imaging of the pelvis including uterus, ovaries, adnexal regions, and pelvic cul-de-sac. It was necessary to proceed with endovaginal exam following the transabdominal exam to better visualize the uterus, endometrium and bilateral  ovaries in addition to the early pregnancy. GESTATION: SINGLETON  Bicornuate Uterus Hx Habitual aborter,   Hx Bicornuate Uterus, T2DM, CHTN Anteverted Bicornuate uterus with single non viabile early IUP.  GS intact within upper Left horn of uterus.  CRL = 16.4 mm = 8+0 weeks,  avascular embryo, no heart motion, Yolk Sac =3.64mm, Gestational Sac = 29 mm = 7+4 weeks Normal ov's,  Left ovary with CL = 17 x 15 mm - Neg adnexal regions - neg CDS - no free fluid present Trans-abdominal and Trans-vaginal exams performed Chaperone: Caroline FETAL ACTIVITY:       No heart motion,  avascular embryo          The fetus is inactive. CERVIX: Appears closed ADNEXA: The ovaries are normal.  Left ov CL = 17 x 15 mm GESTATIONAL AGE AND  BIOMETRICS: Gestational criteria: Estimated Date of Delivery: 09/09/24 by LMP now at [redacted]w[redacted]d Previous Scans:0 GESTATIONAL SAC           29 mm         7+4 weeks CROWN RUMP LENGTH           16.4 mm  8+0 weeks                                                                      AVERAGE EGA(BY THIS SCAN):  8+0 weeks Bicornuate uterus with 8 week MAB Left Horn  TECHNICIAN COMMENTS: US : GA = 9+2 weeks Hx Bicornuate Uterus, T2DM, CHTN Anteverted uterus with single non viabile early IUP.  GS intact within upper Left horn of uterus.  CRL = 16.4 mm = 8+0 weeks,  avascular embryo, no heart motion, Yolk Sac =3.52mm, Gestational Sac = 29 mm = 7+4 weeks Normal ov's,  Left ovary with CL = 17 x 15 mm - Neg adnexal regions - neg CDS - no free fluid present Bicornuate Uterus with 8 week MAB within Left Horn Trans-abdominal and Trans-vaginal exams performed Chaperone: Aleck A copy of this report including all images has been saved and backed up to a second source for retrieval if needed. All measures and details of the anatomical scan, placentation, fluid volume and pelvic anatomy are contained in that report. Nina Phillips 02/07/2024 5:25 PM TECHNIQUE: Both transabdominal and transvaginal ultrasound examinations of  the pelvis were performed. Transabdominal technique was performed for global imaging of the pelvis including uterus, ovaries, adnexal regions, and pelvic cul-de-sac. It was necessary to proceed with endovaginal exam following the transabdominal exam to visualize the uterus. Clinical Impression: Early pregnancy loss, miscarriage, with retention of the embryo Normal general sonographic findings  This recommendation follows SRU consensus guidelines: Diagnostic Criteria for Nonviable Pregnancy Early in the First Trimester. Nina Phillips Med 2013; 630:8556-48.  Nina Phillips 02/12/2024 1:10 PM  US  Intraoperative Result Date: 02/10/2024 CLINICAL DATA:  Ultrasound was provided for use by the ordering physician.  No provider Interpretation or professional fees incurred.     Recent Labs: Lab Results  Component Value Date   WBC 8.2 02/17/2024   HGB 9.7 (L) 02/17/2024   PLT 428 (H) 02/17/2024   NA 140 02/17/2024   K 4.6 02/17/2024   CL 107 02/17/2024   CO2 23 02/17/2024   GLUCOSE 99 02/17/2024   BUN 12 02/17/2024   CREATININE 1.01 (H) 02/17/2024   BILITOT 0.2 02/17/2024   ALKPHOS 79 11/29/2022   AST 7 (L) 02/17/2024   ALT 9 02/17/2024   PROT 7.0 02/17/2024   ALBUMIN 2.8 (L) 11/29/2022   CALCIUM  8.7 02/17/2024   GFRAA 103 08/06/2019   February 17, 2024 ANA negative, Smith negative, SSA negative, SSB negative, dsDNA negative, anticardiolipin negative, beta-2 GP 1 negative, lupus anticoagulant negative C3-C4 normal, RF negative, anti-CCP negative, sed rate 51, magnesium  1.3, uric acid 10.1 Speciality Comments: No specialty comments available.  Procedures:  No procedures performed Allergies: Apple fruit extract, Banana, Other, Shellfish allergy, Tomato, Watermelon [citrullus vulgaris], Bactrim  [sulfamethoxazole -trimethoprim ], and Sulfa  antibiotics   Assessment / Plan:     Visit Diagnoses: No diagnosis found.  Orders: No orders of the defined types were placed in this encounter.  No orders of the  defined types were placed in this encounter.   Face-to-face time spent with patient was *** minutes. Greater than 50% of time was spent in counseling and coordination of care.  Follow-Up Instructions: No follow-ups on file.   Maya Nash, MD  Note - This record  has been created using [employer].  Chart creation errors have been sought, but may not always  have been located. Such creation errors do not reflect on  the standard of medical care.     [1]  Social History Tobacco Use   Smoking status: Former    Passive exposure: Current   Smokeless tobacco: Never   Tobacco comments:    quit 2014  Vaping Use   Vaping status: Never Used  Substance Use Topics   Alcohol use: Yes    Comment: occas   Drug use: Never   "

## 2024-03-05 ENCOUNTER — Encounter: Admitting: Obstetrics & Gynecology

## 2024-03-12 ENCOUNTER — Ambulatory Visit: Admitting: Rheumatology

## 2024-03-12 DIAGNOSIS — Z8269 Family history of other diseases of the musculoskeletal system and connective tissue: Secondary | ICD-10-CM

## 2024-03-12 DIAGNOSIS — M79671 Pain in right foot: Secondary | ICD-10-CM

## 2024-03-12 DIAGNOSIS — D649 Anemia, unspecified: Secondary | ICD-10-CM

## 2024-03-12 DIAGNOSIS — Q513 Bicornate uterus: Secondary | ICD-10-CM

## 2024-03-12 DIAGNOSIS — I10 Essential (primary) hypertension: Secondary | ICD-10-CM

## 2024-03-12 DIAGNOSIS — M79641 Pain in right hand: Secondary | ICD-10-CM

## 2024-03-12 DIAGNOSIS — M1A09X0 Idiopathic chronic gout, multiple sites, without tophus (tophi): Secondary | ICD-10-CM

## 2024-03-12 DIAGNOSIS — M1380 Other specified arthritis, unspecified site: Secondary | ICD-10-CM

## 2024-03-12 DIAGNOSIS — N96 Recurrent pregnancy loss: Secondary | ICD-10-CM

## 2024-03-12 DIAGNOSIS — E1129 Type 2 diabetes mellitus with other diabetic kidney complication: Secondary | ICD-10-CM

## 2024-07-26 ENCOUNTER — Ambulatory Visit: Admitting: Internal Medicine
